# Patient Record
Sex: Male | Born: 1960 | Race: White | Hispanic: No | Marital: Married | Smoking: Current every day smoker
Health system: Southern US, Community
[De-identification: ages and names within clinical notes are randomized; demographics above are authoritative.]

## PROBLEM LIST (undated history)

## (undated) DIAGNOSIS — F1010 Alcohol abuse, uncomplicated: Secondary | ICD-10-CM

## (undated) DIAGNOSIS — M1990 Unspecified osteoarthritis, unspecified site: Secondary | ICD-10-CM

## (undated) DIAGNOSIS — I10 Essential (primary) hypertension: Secondary | ICD-10-CM

---

## 2003-06-23 ENCOUNTER — Other Ambulatory Visit: Payer: Self-pay

## 2004-04-17 ENCOUNTER — Emergency Department: Payer: Self-pay | Admitting: Emergency Medicine

## 2004-07-04 ENCOUNTER — Emergency Department: Payer: Self-pay | Admitting: Emergency Medicine

## 2004-09-18 ENCOUNTER — Emergency Department: Payer: Self-pay | Admitting: Emergency Medicine

## 2005-03-24 ENCOUNTER — Emergency Department: Payer: Self-pay | Admitting: Emergency Medicine

## 2005-04-04 ENCOUNTER — Ambulatory Visit: Payer: Self-pay | Admitting: Emergency Medicine

## 2005-06-22 ENCOUNTER — Emergency Department: Payer: Self-pay | Admitting: Emergency Medicine

## 2005-07-03 ENCOUNTER — Emergency Department: Payer: Self-pay | Admitting: Emergency Medicine

## 2005-09-25 ENCOUNTER — Emergency Department: Payer: Self-pay | Admitting: Emergency Medicine

## 2005-09-30 ENCOUNTER — Emergency Department: Payer: Self-pay | Admitting: Internal Medicine

## 2005-10-09 ENCOUNTER — Inpatient Hospital Stay: Payer: Self-pay | Admitting: Cardiology

## 2007-04-19 ENCOUNTER — Emergency Department: Payer: Self-pay | Admitting: Emergency Medicine

## 2007-05-02 ENCOUNTER — Emergency Department: Payer: Self-pay | Admitting: Unknown Physician Specialty

## 2007-12-20 ENCOUNTER — Ambulatory Visit: Payer: Self-pay

## 2009-05-23 ENCOUNTER — Emergency Department: Payer: Self-pay | Admitting: Emergency Medicine

## 2009-06-29 ENCOUNTER — Observation Stay: Payer: Self-pay | Admitting: Otolaryngology

## 2009-09-24 ENCOUNTER — Emergency Department: Payer: Self-pay | Admitting: Emergency Medicine

## 2010-07-05 ENCOUNTER — Emergency Department: Payer: Self-pay | Admitting: Emergency Medicine

## 2010-12-31 ENCOUNTER — Emergency Department: Payer: Self-pay | Admitting: Emergency Medicine

## 2011-03-17 ENCOUNTER — Inpatient Hospital Stay: Payer: Self-pay | Admitting: Psychiatry

## 2011-04-17 ENCOUNTER — Emergency Department: Payer: Self-pay | Admitting: Emergency Medicine

## 2012-10-22 ENCOUNTER — Emergency Department: Payer: Self-pay | Admitting: Emergency Medicine

## 2012-10-22 LAB — CBC
MCHC: 35 g/dL (ref 32.0–36.0)
MCV: 97 fL (ref 80–100)
RBC: 4.74 10*6/uL (ref 4.40–5.90)
WBC: 9.3 10*3/uL (ref 3.8–10.6)

## 2012-10-23 LAB — COMPREHENSIVE METABOLIC PANEL
Albumin: 4.3 g/dL (ref 3.4–5.0)
Anion Gap: 8 (ref 7–16)
BUN: 5 mg/dL — ABNORMAL LOW (ref 7–18)
Calcium, Total: 8.3 mg/dL — ABNORMAL LOW (ref 8.5–10.1)
Chloride: 109 mmol/L — ABNORMAL HIGH (ref 98–107)
Co2: 23 mmol/L (ref 21–32)
Creatinine: 0.76 mg/dL (ref 0.60–1.30)
EGFR (African American): >60
Potassium: 3.7 mmol/L (ref 3.5–5.1)
SGPT (ALT): 90 U/L — ABNORMAL HIGH (ref 12–78)
Total Protein: 8.4 g/dL — ABNORMAL HIGH (ref 6.4–8.2)

## 2012-10-23 LAB — DRUG SCREEN, URINE
Amphetamines, Ur Screen: NEGATIVE (ref ?–1000)
Cannabinoid 50 Ng, Ur ~~LOC~~: NEGATIVE (ref ?–50)
MDMA (Ecstasy)Ur Screen: NEGATIVE (ref ?–500)
Methadone, Ur Screen: NEGATIVE (ref ?–300)
Opiate, Ur Screen: NEGATIVE (ref ?–300)
Phencyclidine (PCP) Ur S: NEGATIVE (ref ?–25)
Tricyclic, Ur Screen: NEGATIVE (ref ?–1000)

## 2012-10-23 LAB — URINALYSIS, COMPLETE
Bacteria: NONE SEEN
Bilirubin,UR: NEGATIVE
Glucose,UR: NEGATIVE mg/dL (ref 0–75)
Leukocyte Esterase: NEGATIVE
Protein: NEGATIVE
Squamous Epithelial: <1
WBC UR: <1 /HPF (ref 0–5)

## 2012-10-23 LAB — ETHANOL: Ethanol %: 0.27 % — ABNORMAL HIGH (ref 0.000–0.080)

## 2012-10-23 LAB — SALICYLATE LEVEL: Salicylates, Serum: 2.6 mg/dL

## 2013-05-28 ENCOUNTER — Emergency Department: Payer: Self-pay | Admitting: Emergency Medicine

## 2013-05-28 LAB — CBC WITH DIFFERENTIAL/PLATELET
Basophil #: 0.1 10*3/uL (ref 0.0–0.1)
Basophil %: 0.5 %
HCT: 42.8 % (ref 40.0–52.0)
HGB: 14.7 g/dL (ref 13.0–18.0)
MCH: 33.2 pg (ref 26.0–34.0)
MCV: 96 fL (ref 80–100)
Neutrophil %: 56 %
Platelet: 192 10*3/uL (ref 150–440)
RBC: 4.44 10*6/uL (ref 4.40–5.90)
WBC: 11.4 10*3/uL — ABNORMAL HIGH (ref 3.8–10.6)

## 2013-05-28 LAB — COMPREHENSIVE METABOLIC PANEL
Alkaline Phosphatase: 82 U/L
Anion Gap: 9 (ref 7–16)
Bilirubin,Total: 0.4 mg/dL (ref 0.2–1.0)
Chloride: 103 mmol/L (ref 98–107)
Co2: 22 mmol/L (ref 21–32)
EGFR (African American): >60
EGFR (Non-African Amer.): >60
SGOT(AST): 63 U/L — ABNORMAL HIGH (ref 15–37)
SGPT (ALT): 69 U/L (ref 12–78)
Total Protein: 7.5 g/dL (ref 6.4–8.2)

## 2013-05-28 LAB — URINALYSIS, COMPLETE
Bilirubin,UR: NEGATIVE
Ph: 6 (ref 4.5–8.0)
Protein: NEGATIVE
RBC,UR: NONE SEEN /HPF (ref 0–5)
Specific Gravity: 1.003 (ref 1.003–1.030)
Squamous Epithelial: NONE SEEN

## 2013-05-28 LAB — LIPASE, BLOOD: Lipase: 97 U/L (ref 73–393)

## 2013-05-28 LAB — PROTIME-INR: Prothrombin Time: 12.7 secs (ref 11.5–14.7)

## 2013-05-28 LAB — ETHANOL: Ethanol %: 0.281 % — ABNORMAL HIGH (ref 0.000–0.080)

## 2013-06-04 ENCOUNTER — Emergency Department: Payer: Self-pay | Admitting: Emergency Medicine

## 2013-09-07 ENCOUNTER — Emergency Department: Payer: Self-pay | Admitting: Internal Medicine

## 2013-09-07 LAB — COMPREHENSIVE METABOLIC PANEL
ALT: 64 U/L (ref 12–78)
ANION GAP: 8 (ref 7–16)
Albumin: 4.2 g/dL (ref 3.4–5.0)
Alkaline Phosphatase: 99 U/L
BUN: 6 mg/dL — ABNORMAL LOW (ref 7–18)
Bilirubin,Total: 0.3 mg/dL (ref 0.2–1.0)
CO2: 24 mmol/L (ref 21–32)
CREATININE: 0.94 mg/dL (ref 0.60–1.30)
Calcium, Total: 8.5 mg/dL (ref 8.5–10.1)
Chloride: 108 mmol/L — ABNORMAL HIGH (ref 98–107)
GLUCOSE: 109 mg/dL — AB (ref 65–99)
OSMOLALITY: 278 (ref 275–301)
Potassium: 3.6 mmol/L (ref 3.5–5.1)
SGOT(AST): 46 U/L — ABNORMAL HIGH (ref 15–37)
Sodium: 140 mmol/L (ref 136–145)
Total Protein: 8.3 g/dL — ABNORMAL HIGH (ref 6.4–8.2)

## 2013-09-07 LAB — DRUG SCREEN, URINE

## 2013-09-07 LAB — CBC
HCT: 48.2 % (ref 40.0–52.0)
HGB: 15.7 g/dL (ref 13.0–18.0)
MCH: 32 pg (ref 26.0–34.0)
MCHC: 32.6 g/dL (ref 32.0–36.0)
MCV: 98 fL (ref 80–100)
Platelet: 212 10*3/uL (ref 150–440)
RBC: 4.92 10*6/uL (ref 4.40–5.90)
RDW: 12.7 % (ref 11.5–14.5)
WBC: 11.2 10*3/uL — AB (ref 3.8–10.6)

## 2013-09-07 LAB — ETHANOL
Ethanol %: 0.21 % — ABNORMAL HIGH (ref 0.000–0.080)
Ethanol: 210 mg/dL

## 2013-09-07 LAB — URINALYSIS, COMPLETE
BACTERIA: NONE SEEN
Bilirubin,UR: NEGATIVE
GLUCOSE, UR: NEGATIVE mg/dL (ref 0–75)
KETONE: NEGATIVE
Leukocyte Esterase: NEGATIVE
NITRITE: NEGATIVE
PH: 6 (ref 4.5–8.0)
PROTEIN: NEGATIVE
RBC,UR: 1 /HPF (ref 0–5)
SQUAMOUS EPITHELIAL: NONE SEEN
Specific Gravity: 1.002 (ref 1.003–1.030)
WBC UR: NONE SEEN /HPF (ref 0–5)

## 2013-09-07 LAB — SALICYLATE LEVEL: Salicylates, Serum: 3.4 mg/dL — ABNORMAL HIGH

## 2013-09-07 LAB — ACETAMINOPHEN LEVEL: Acetaminophen: <2 ug/mL

## 2013-11-03 ENCOUNTER — Emergency Department: Payer: Self-pay | Admitting: Internal Medicine

## 2013-11-06 ENCOUNTER — Emergency Department: Payer: Self-pay | Admitting: Emergency Medicine

## 2013-11-06 LAB — CBC
HCT: 43.9 % (ref 40.0–52.0)
HGB: 14.9 g/dL (ref 13.0–18.0)
MCH: 33.4 pg (ref 26.0–34.0)
MCHC: 33.9 g/dL (ref 32.0–36.0)
MCV: 98 fL (ref 80–100)
Platelet: 197 10*3/uL (ref 150–440)
RBC: 4.45 10*6/uL (ref 4.40–5.90)
RDW: 13.3 % (ref 11.5–14.5)
WBC: 9.5 10*3/uL (ref 3.8–10.6)

## 2013-11-06 LAB — DRUG SCREEN, URINE
Amphetamines, Ur Screen: NEGATIVE (ref ?–1000)
BARBITURATES, UR SCREEN: NEGATIVE (ref ?–200)
BENZODIAZEPINE, UR SCRN: NEGATIVE (ref ?–200)
CANNABINOID 50 NG, UR ~~LOC~~: NEGATIVE (ref ?–50)
Cocaine Metabolite,Ur ~~LOC~~: NEGATIVE (ref ?–300)
MDMA (ECSTASY) UR SCREEN: NEGATIVE (ref ?–500)
METHADONE, UR SCREEN: NEGATIVE (ref ?–300)
Opiate, Ur Screen: NEGATIVE (ref ?–300)
PHENCYCLIDINE (PCP) UR S: NEGATIVE (ref ?–25)
Tricyclic, Ur Screen: NEGATIVE (ref ?–1000)

## 2013-11-06 LAB — COMPREHENSIVE METABOLIC PANEL
ANION GAP: 7 (ref 7–16)
AST: 50 U/L — AB (ref 15–37)
Albumin: 4 g/dL (ref 3.4–5.0)
Alkaline Phosphatase: 96 U/L
BILIRUBIN TOTAL: 0.3 mg/dL (ref 0.2–1.0)
BUN: 7 mg/dL (ref 7–18)
CO2: 22 mmol/L (ref 21–32)
CREATININE: 0.92 mg/dL (ref 0.60–1.30)
Calcium, Total: 8.2 mg/dL — ABNORMAL LOW (ref 8.5–10.1)
Chloride: 112 mmol/L — ABNORMAL HIGH (ref 98–107)
EGFR (African American): >60
EGFR (Non-African Amer.): >60
Glucose: 113 mg/dL — ABNORMAL HIGH (ref 65–99)
OSMOLALITY: 280 (ref 275–301)
Potassium: 3.8 mmol/L (ref 3.5–5.1)
SGPT (ALT): 58 U/L (ref 12–78)
Sodium: 141 mmol/L (ref 136–145)
Total Protein: 7.7 g/dL (ref 6.4–8.2)

## 2013-11-06 LAB — URINALYSIS, COMPLETE
BACTERIA: NONE SEEN
Bilirubin,UR: NEGATIVE
GLUCOSE, UR: NEGATIVE mg/dL (ref 0–75)
Ketone: NEGATIVE
LEUKOCYTE ESTERASE: NEGATIVE
Nitrite: NEGATIVE
Ph: 6 (ref 4.5–8.0)
Protein: NEGATIVE
RBC,UR: NONE SEEN /HPF (ref 0–5)
Specific Gravity: 1.002 (ref 1.003–1.030)
Squamous Epithelial: NONE SEEN
WBC UR: <1 /HPF (ref 0–5)

## 2013-11-06 LAB — ETHANOL
Ethanol %: 0.292 % — ABNORMAL HIGH (ref 0.000–0.080)
Ethanol: 292 mg/dL

## 2013-11-15 ENCOUNTER — Emergency Department: Payer: Self-pay | Admitting: Emergency Medicine

## 2013-11-15 LAB — CBC
HCT: 43.8 % (ref 40.0–52.0)
HGB: 15.3 g/dL (ref 13.0–18.0)
MCH: 33.9 pg (ref 26.0–34.0)
MCHC: 34.9 g/dL (ref 32.0–36.0)
MCV: 97 fL (ref 80–100)
Platelet: 172 10*3/uL (ref 150–440)
RBC: 4.5 10*6/uL (ref 4.40–5.90)
RDW: 13.1 % (ref 11.5–14.5)
WBC: 9.9 10*3/uL (ref 3.8–10.6)

## 2013-11-15 LAB — URINALYSIS, COMPLETE
Bacteria: NONE SEEN
Bilirubin,UR: NEGATIVE
GLUCOSE, UR: NEGATIVE mg/dL (ref 0–75)
Ketone: NEGATIVE
Leukocyte Esterase: NEGATIVE
NITRITE: NEGATIVE
PH: 6 (ref 4.5–8.0)
Protein: NEGATIVE
RBC,UR: NONE SEEN /HPF (ref 0–5)
Specific Gravity: 1.003 (ref 1.003–1.030)
Squamous Epithelial: NONE SEEN
WBC UR: NONE SEEN /HPF (ref 0–5)

## 2013-11-15 LAB — COMPREHENSIVE METABOLIC PANEL
ALBUMIN: 3.8 g/dL (ref 3.4–5.0)
ALT: 58 U/L (ref 12–78)
Alkaline Phosphatase: 78 U/L
Anion Gap: 9 (ref 7–16)
BUN: 8 mg/dL (ref 7–18)
Bilirubin,Total: 0.3 mg/dL (ref 0.2–1.0)
CHLORIDE: 107 mmol/L (ref 98–107)
Calcium, Total: 8.3 mg/dL — ABNORMAL LOW (ref 8.5–10.1)
Co2: 23 mmol/L (ref 21–32)
Creatinine: 0.86 mg/dL (ref 0.60–1.30)
EGFR (Non-African Amer.): >60
GLUCOSE: 111 mg/dL — AB (ref 65–99)
OSMOLALITY: 277 (ref 275–301)
Potassium: 3.5 mmol/L (ref 3.5–5.1)
SGOT(AST): 53 U/L — ABNORMAL HIGH (ref 15–37)
SODIUM: 139 mmol/L (ref 136–145)
Total Protein: 7.6 g/dL (ref 6.4–8.2)

## 2013-11-15 LAB — DRUG SCREEN, URINE

## 2013-11-15 LAB — ETHANOL
Ethanol %: 0.292 % — ABNORMAL HIGH (ref 0.000–0.080)
Ethanol: 292 mg/dL

## 2013-11-15 LAB — SALICYLATE LEVEL: Salicylates, Serum: 3.4 mg/dL — ABNORMAL HIGH

## 2013-11-15 LAB — ACETAMINOPHEN LEVEL: Acetaminophen: <2 ug/mL

## 2013-11-19 ENCOUNTER — Inpatient Hospital Stay: Payer: Self-pay | Admitting: Psychiatry

## 2013-11-19 LAB — COMPREHENSIVE METABOLIC PANEL
Albumin: 3.9 g/dL (ref 3.4–5.0)
Alkaline Phosphatase: 73 U/L
Anion Gap: 9 (ref 7–16)
BUN: 6 mg/dL — ABNORMAL LOW (ref 7–18)
Bilirubin,Total: 0.3 mg/dL (ref 0.2–1.0)
CALCIUM: 8.2 mg/dL — AB (ref 8.5–10.1)
CREATININE: 0.73 mg/dL (ref 0.60–1.30)
Chloride: 104 mmol/L (ref 98–107)
Co2: 24 mmol/L (ref 21–32)
EGFR (African American): >60
EGFR (Non-African Amer.): >60
Glucose: 100 mg/dL — ABNORMAL HIGH (ref 65–99)
Osmolality: 272 (ref 275–301)
Potassium: 3.4 mmol/L — ABNORMAL LOW (ref 3.5–5.1)
SGOT(AST): 57 U/L — ABNORMAL HIGH (ref 15–37)
SGPT (ALT): 72 U/L (ref 12–78)
SODIUM: 137 mmol/L (ref 136–145)
Total Protein: 7.7 g/dL (ref 6.4–8.2)

## 2013-11-19 LAB — URINALYSIS, COMPLETE
Bacteria: NONE SEEN
Bilirubin,UR: NEGATIVE
GLUCOSE, UR: NEGATIVE mg/dL (ref 0–75)
KETONE: NEGATIVE
Nitrite: NEGATIVE
Ph: 6 (ref 4.5–8.0)
Protein: NEGATIVE
Specific Gravity: 1.003 (ref 1.003–1.030)
Squamous Epithelial: <1
WBC UR: 3 /HPF (ref 0–5)

## 2013-11-19 LAB — CBC
HCT: 44.1 % (ref 40.0–52.0)
HGB: 15 g/dL (ref 13.0–18.0)
MCH: 33.3 pg (ref 26.0–34.0)
MCHC: 34 g/dL (ref 32.0–36.0)
MCV: 98 fL (ref 80–100)
PLATELETS: 179 10*3/uL (ref 150–440)
RBC: 4.52 10*6/uL (ref 4.40–5.90)
RDW: 12.8 % (ref 11.5–14.5)
WBC: 8.8 10*3/uL (ref 3.8–10.6)

## 2013-11-19 LAB — DRUG SCREEN, URINE

## 2013-11-19 LAB — SALICYLATE LEVEL: SALICYLATES, SERUM: 3.3 mg/dL — AB

## 2013-11-19 LAB — ETHANOL
Ethanol %: 0.281 % — ABNORMAL HIGH (ref 0.000–0.080)
Ethanol: 281 mg/dL

## 2013-11-19 LAB — ACETAMINOPHEN LEVEL: Acetaminophen: <2 ug/mL

## 2013-11-26 ENCOUNTER — Emergency Department: Payer: Self-pay | Admitting: Emergency Medicine

## 2013-11-26 LAB — URINALYSIS, COMPLETE
BACTERIA: NONE SEEN
Bilirubin,UR: NEGATIVE
GLUCOSE, UR: NEGATIVE mg/dL (ref 0–75)
Ketone: NEGATIVE
Leukocyte Esterase: NEGATIVE
NITRITE: NEGATIVE
PH: 6 (ref 4.5–8.0)
Protein: NEGATIVE
RBC,UR: NONE SEEN /HPF (ref 0–5)
SPECIFIC GRAVITY: 1.003 (ref 1.003–1.030)
SQUAMOUS EPITHELIAL: NONE SEEN
WBC UR: 1 /HPF (ref 0–5)

## 2013-11-26 LAB — COMPREHENSIVE METABOLIC PANEL
ALK PHOS: 71 U/L
AST: 42 U/L — AB (ref 15–37)
Albumin: 3.7 g/dL (ref 3.4–5.0)
Anion Gap: 8 (ref 7–16)
BUN: 7 mg/dL (ref 7–18)
Bilirubin,Total: 0.2 mg/dL (ref 0.2–1.0)
CALCIUM: 8 mg/dL — AB (ref 8.5–10.1)
CO2: 24 mmol/L (ref 21–32)
Chloride: 102 mmol/L (ref 98–107)
Creatinine: 0.84 mg/dL (ref 0.60–1.30)
EGFR (African American): >60
Glucose: 124 mg/dL — ABNORMAL HIGH (ref 65–99)
Osmolality: 268 (ref 275–301)
Potassium: 3.5 mmol/L (ref 3.5–5.1)
SGPT (ALT): 53 U/L (ref 12–78)
Sodium: 134 mmol/L — ABNORMAL LOW (ref 136–145)
Total Protein: 7.3 g/dL (ref 6.4–8.2)

## 2013-11-26 LAB — CBC
HCT: 42.4 % (ref 40.0–52.0)
HGB: 14.4 g/dL (ref 13.0–18.0)
MCH: 33.4 pg (ref 26.0–34.0)
MCHC: 33.9 g/dL (ref 32.0–36.0)
MCV: 99 fL (ref 80–100)
Platelet: 196 10*3/uL (ref 150–440)
RBC: 4.3 10*6/uL — ABNORMAL LOW (ref 4.40–5.90)
RDW: 13 % (ref 11.5–14.5)
WBC: 12 10*3/uL — AB (ref 3.8–10.6)

## 2013-11-26 LAB — DRUG SCREEN, URINE

## 2013-11-26 LAB — ACETAMINOPHEN LEVEL

## 2013-11-26 LAB — SALICYLATE LEVEL: Salicylates, Serum: 2.6 mg/dL

## 2013-11-26 LAB — ETHANOL
Ethanol %: 0.261 % — ABNORMAL HIGH (ref 0.000–0.080)
Ethanol: 261 mg/dL

## 2013-12-06 ENCOUNTER — Emergency Department: Payer: Self-pay | Admitting: Emergency Medicine

## 2013-12-06 LAB — DRUG SCREEN, URINE

## 2013-12-06 LAB — URINALYSIS, COMPLETE
BILIRUBIN, UR: NEGATIVE
Bacteria: NONE SEEN
GLUCOSE, UR: NEGATIVE mg/dL (ref 0–75)
Ketone: NEGATIVE
Leukocyte Esterase: NEGATIVE
Nitrite: NEGATIVE
Ph: 6 (ref 4.5–8.0)
Protein: NEGATIVE
RBC, UR: NONE SEEN /HPF (ref 0–5)
Specific Gravity: 1.002 (ref 1.003–1.030)
Squamous Epithelial: NONE SEEN
WBC UR: NONE SEEN /HPF (ref 0–5)

## 2013-12-06 LAB — CBC
HCT: 44.8 % (ref 40.0–52.0)
HGB: 15.2 g/dL (ref 13.0–18.0)
MCH: 33 pg (ref 26.0–34.0)
MCHC: 33.9 g/dL (ref 32.0–36.0)
MCV: 97 fL (ref 80–100)
PLATELETS: 203 10*3/uL (ref 150–440)
RBC: 4.61 10*6/uL (ref 4.40–5.90)
RDW: 12.7 % (ref 11.5–14.5)
WBC: 10.9 10*3/uL — ABNORMAL HIGH (ref 3.8–10.6)

## 2013-12-06 LAB — COMPREHENSIVE METABOLIC PANEL
ALBUMIN: 4.1 g/dL (ref 3.4–5.0)
ANION GAP: 8 (ref 7–16)
Alkaline Phosphatase: 82 U/L
BUN: 6 mg/dL — ABNORMAL LOW (ref 7–18)
Bilirubin,Total: 0.3 mg/dL (ref 0.2–1.0)
CREATININE: 0.78 mg/dL (ref 0.60–1.30)
Calcium, Total: 8.4 mg/dL — ABNORMAL LOW (ref 8.5–10.1)
Chloride: 106 mmol/L (ref 98–107)
Co2: 23 mmol/L (ref 21–32)
EGFR (African American): >60
Glucose: 103 mg/dL — ABNORMAL HIGH (ref 65–99)
Osmolality: 272 (ref 275–301)
POTASSIUM: 3.6 mmol/L (ref 3.5–5.1)
SGOT(AST): 61 U/L — ABNORMAL HIGH (ref 15–37)
SGPT (ALT): 50 U/L (ref 12–78)
Sodium: 137 mmol/L (ref 136–145)
TOTAL PROTEIN: 8.1 g/dL (ref 6.4–8.2)

## 2013-12-06 LAB — ETHANOL
ETHANOL %: 0.322 % — AB (ref 0.000–0.080)
Ethanol: 322 mg/dL

## 2013-12-06 LAB — ACETAMINOPHEN LEVEL

## 2013-12-06 LAB — SALICYLATE LEVEL: Salicylates, Serum: 4.4 mg/dL — ABNORMAL HIGH

## 2013-12-20 ENCOUNTER — Inpatient Hospital Stay: Payer: Self-pay | Admitting: Internal Medicine

## 2013-12-20 LAB — COMPREHENSIVE METABOLIC PANEL
ALBUMIN: 3.9 g/dL (ref 3.4–5.0)
ALT: 43 U/L (ref 12–78)
AST: 47 U/L — AB (ref 15–37)
Alkaline Phosphatase: 81 U/L
Anion Gap: 12 (ref 7–16)
BUN: 5 mg/dL — AB (ref 7–18)
Bilirubin,Total: 0.3 mg/dL (ref 0.2–1.0)
CALCIUM: 8 mg/dL — AB (ref 8.5–10.1)
CREATININE: 0.81 mg/dL (ref 0.60–1.30)
Chloride: 104 mmol/L (ref 98–107)
Co2: 24 mmol/L (ref 21–32)
EGFR (Non-African Amer.): >60
GLUCOSE: 93 mg/dL (ref 65–99)
Osmolality: 276 (ref 275–301)
Potassium: 3.4 mmol/L — ABNORMAL LOW (ref 3.5–5.1)
SODIUM: 140 mmol/L (ref 136–145)
TOTAL PROTEIN: 7.6 g/dL (ref 6.4–8.2)

## 2013-12-20 LAB — DRUG SCREEN, URINE
Amphetamines, Ur Screen: NEGATIVE (ref ?–1000)
BARBITURATES, UR SCREEN: NEGATIVE (ref ?–200)
BENZODIAZEPINE, UR SCRN: NEGATIVE (ref ?–200)
Cannabinoid 50 Ng, Ur ~~LOC~~: NEGATIVE (ref ?–50)
Cocaine Metabolite,Ur ~~LOC~~: NEGATIVE (ref ?–300)
MDMA (ECSTASY) UR SCREEN: NEGATIVE (ref ?–500)
METHADONE, UR SCREEN: NEGATIVE (ref ?–300)
Opiate, Ur Screen: NEGATIVE (ref ?–300)
PHENCYCLIDINE (PCP) UR S: NEGATIVE (ref ?–25)
TRICYCLIC, UR SCREEN: NEGATIVE (ref ?–1000)

## 2013-12-20 LAB — URINALYSIS, COMPLETE
Bacteria: NONE SEEN
Bilirubin,UR: NEGATIVE
Blood: NEGATIVE
Glucose,UR: NEGATIVE mg/dL (ref 0–75)
KETONE: NEGATIVE
LEUKOCYTE ESTERASE: NEGATIVE
Nitrite: NEGATIVE
PROTEIN: NEGATIVE
Ph: 6 (ref 4.5–8.0)
RBC,UR: <1 /HPF (ref 0–5)
SPECIFIC GRAVITY: 1.003 (ref 1.003–1.030)
SQUAMOUS EPITHELIAL: NONE SEEN
WBC UR: NONE SEEN /HPF (ref 0–5)

## 2013-12-20 LAB — ETHANOL
Ethanol %: 0.277 % — ABNORMAL HIGH (ref 0.000–0.080)
Ethanol: 277 mg/dL

## 2013-12-20 LAB — SALICYLATE LEVEL: SALICYLATES, SERUM: 3.8 mg/dL — AB

## 2013-12-20 LAB — CBC
HCT: 44.2 % (ref 40.0–52.0)
HGB: 14.6 g/dL (ref 13.0–18.0)
MCH: 33 pg (ref 26.0–34.0)
MCHC: 33.1 g/dL (ref 32.0–36.0)
MCV: 100 fL (ref 80–100)
PLATELETS: 178 10*3/uL (ref 150–440)
RBC: 4.44 10*6/uL (ref 4.40–5.90)
RDW: 12.8 % (ref 11.5–14.5)
WBC: 10.2 10*3/uL (ref 3.8–10.6)

## 2013-12-20 LAB — TSH: Thyroid Stimulating Horm: 1.46 u[IU]/mL

## 2013-12-20 LAB — PHOSPHORUS: Phosphorus: 4 mg/dL (ref 2.5–4.9)

## 2013-12-20 LAB — ACETAMINOPHEN LEVEL: Acetaminophen: <2 ug/mL

## 2013-12-20 LAB — LIPASE, BLOOD: Lipase: 129 U/L (ref 73–393)

## 2013-12-20 LAB — TROPONIN I

## 2013-12-21 LAB — BASIC METABOLIC PANEL
Anion Gap: 4 — ABNORMAL LOW (ref 7–16)
BUN: 5 mg/dL — ABNORMAL LOW (ref 7–18)
CHLORIDE: 118 mmol/L — AB (ref 98–107)
CO2: 23 mmol/L (ref 21–32)
Calcium, Total: 7.6 mg/dL — ABNORMAL LOW (ref 8.5–10.1)
Creatinine: 0.94 mg/dL (ref 0.60–1.30)
EGFR (African American): >60
EGFR (Non-African Amer.): >60
Glucose: 114 mg/dL — ABNORMAL HIGH (ref 65–99)
Osmolality: 287 (ref 275–301)
Potassium: 3.9 mmol/L (ref 3.5–5.1)
SODIUM: 145 mmol/L (ref 136–145)

## 2013-12-21 LAB — PHOSPHORUS
PHOSPHORUS: 2.1 mg/dL — AB (ref 2.5–4.9)
PHOSPHORUS: 1.8 mg/dL — AB (ref 2.5–4.9)

## 2013-12-21 LAB — CBC WITH DIFFERENTIAL/PLATELET
Basophil #: 0.1 10*3/uL (ref 0.0–0.1)
Basophil %: 1.1 %
Eosinophil #: 0.2 10*3/uL (ref 0.0–0.7)
Eosinophil %: 2.6 %
HCT: 36.5 % — ABNORMAL LOW (ref 40.0–52.0)
HGB: 12.3 g/dL — ABNORMAL LOW (ref 13.0–18.0)
Lymphocyte #: 2.1 10*3/uL (ref 1.0–3.6)
Lymphocyte %: 24.3 %
MCH: 33.5 pg (ref 26.0–34.0)
MCHC: 33.6 g/dL (ref 32.0–36.0)
MCV: 100 fL (ref 80–100)
Monocyte #: 0.8 x10 3/mm (ref 0.2–1.0)
Monocyte %: 8.8 %
Neutrophil #: 5.5 10*3/uL (ref 1.4–6.5)
Neutrophil %: 63.2 %
PLATELETS: 153 10*3/uL (ref 150–440)
RBC: 3.66 10*6/uL — ABNORMAL LOW (ref 4.40–5.90)
RDW: 13.3 % (ref 11.5–14.5)
WBC: 8.8 10*3/uL (ref 3.8–10.6)

## 2013-12-21 LAB — COMPREHENSIVE METABOLIC PANEL
ALBUMIN: 2.7 g/dL — AB (ref 3.4–5.0)
ALK PHOS: 55 U/L
AST: 21 U/L (ref 15–37)
Anion Gap: 5 — ABNORMAL LOW (ref 7–16)
BUN: 3 mg/dL — ABNORMAL LOW (ref 7–18)
Bilirubin,Total: 0.2 mg/dL (ref 0.2–1.0)
CREATININE: 0.91 mg/dL (ref 0.60–1.30)
Calcium, Total: 7.4 mg/dL — ABNORMAL LOW (ref 8.5–10.1)
Chloride: 117 mmol/L — ABNORMAL HIGH (ref 98–107)
Co2: 24 mmol/L (ref 21–32)
GLUCOSE: 126 mg/dL — AB (ref 65–99)
Osmolality: 289 (ref 275–301)
Potassium: 3.9 mmol/L (ref 3.5–5.1)
SGPT (ALT): 27 U/L (ref 12–78)
SODIUM: 146 mmol/L — AB (ref 136–145)
TOTAL PROTEIN: 5.4 g/dL — AB (ref 6.4–8.2)

## 2013-12-21 LAB — TSH: THYROID STIMULATING HORM: 1.33 u[IU]/mL

## 2013-12-21 LAB — MAGNESIUM
MAGNESIUM: 2.6 mg/dL — AB
Magnesium: 2.7 mg/dL — ABNORMAL HIGH

## 2013-12-22 LAB — BASIC METABOLIC PANEL
ANION GAP: 10 (ref 7–16)
BUN: 6 mg/dL — ABNORMAL LOW (ref 7–18)
Calcium, Total: 7.5 mg/dL — ABNORMAL LOW (ref 8.5–10.1)
Chloride: 114 mmol/L — ABNORMAL HIGH (ref 98–107)
Co2: 21 mmol/L (ref 21–32)
Creatinine: 1.12 mg/dL (ref 0.60–1.30)
EGFR (African American): >60
EGFR (Non-African Amer.): >60
Glucose: 102 mg/dL — ABNORMAL HIGH (ref 65–99)
Osmolality: 287 (ref 275–301)
Potassium: 3.7 mmol/L (ref 3.5–5.1)
Sodium: 145 mmol/L (ref 136–145)

## 2013-12-22 LAB — CBC WITH DIFFERENTIAL/PLATELET
Basophil #: 0.1 10*3/uL (ref 0.0–0.1)
Basophil %: 1.3 %
Eosinophil #: 0.2 10*3/uL (ref 0.0–0.7)
Eosinophil %: 2.4 %
HCT: 36 % — AB (ref 40.0–52.0)
HGB: 11.5 g/dL — ABNORMAL LOW (ref 13.0–18.0)
LYMPHS PCT: 25.7 %
Lymphocyte #: 2.3 10*3/uL (ref 1.0–3.6)
MCH: 32.5 pg (ref 26.0–34.0)
MCHC: 32 g/dL (ref 32.0–36.0)
MCV: 102 fL — ABNORMAL HIGH (ref 80–100)
Monocyte #: 0.9 x10 3/mm (ref 0.2–1.0)
Monocyte %: 10 %
NEUTROS ABS: 5.4 10*3/uL (ref 1.4–6.5)
Neutrophil %: 60.6 %
Platelet: 134 10*3/uL — ABNORMAL LOW (ref 150–440)
RBC: 3.54 10*6/uL — ABNORMAL LOW (ref 4.40–5.90)
RDW: 14.1 % (ref 11.5–14.5)
WBC: 8.9 10*3/uL (ref 3.8–10.6)

## 2013-12-22 LAB — PHOSPHORUS: PHOSPHORUS: 2.4 mg/dL — AB (ref 2.5–4.9)

## 2013-12-22 LAB — MAGNESIUM: MAGNESIUM: 2.1 mg/dL

## 2013-12-23 LAB — BASIC METABOLIC PANEL
ANION GAP: 9 (ref 7–16)
BUN: 6 mg/dL — AB (ref 7–18)
CALCIUM: 7.4 mg/dL — AB (ref 8.5–10.1)
CHLORIDE: 114 mmol/L — AB (ref 98–107)
Co2: 22 mmol/L (ref 21–32)
Creatinine: 0.85 mg/dL (ref 0.60–1.30)
EGFR (African American): >60
Glucose: 117 mg/dL — ABNORMAL HIGH (ref 65–99)
Osmolality: 287 (ref 275–301)
Potassium: 4.2 mmol/L (ref 3.5–5.1)
SODIUM: 145 mmol/L (ref 136–145)

## 2013-12-23 LAB — MAGNESIUM: MAGNESIUM: 2.1 mg/dL

## 2013-12-23 LAB — PHOSPHORUS: Phosphorus: 2.4 mg/dL — ABNORMAL LOW (ref 2.5–4.9)

## 2013-12-24 LAB — CBC WITH DIFFERENTIAL/PLATELET
BASOS ABS: 0 10*3/uL (ref 0.0–0.1)
Basophil %: 0.1 %
EOS ABS: 0 10*3/uL (ref 0.0–0.7)
Eosinophil %: 0 %
HCT: 38.1 % — ABNORMAL LOW (ref 40.0–52.0)
HGB: 12.3 g/dL — AB (ref 13.0–18.0)
LYMPHS ABS: 0.7 10*3/uL — AB (ref 1.0–3.6)
Lymphocyte %: 7.1 %
MCH: 32.6 pg (ref 26.0–34.0)
MCHC: 32.4 g/dL (ref 32.0–36.0)
MCV: 101 fL — ABNORMAL HIGH (ref 80–100)
MONO ABS: 0.5 x10 3/mm (ref 0.2–1.0)
Monocyte %: 5.4 %
Neutrophil #: 8.3 10*3/uL — ABNORMAL HIGH (ref 1.4–6.5)
Neutrophil %: 87.4 %
Platelet: 173 10*3/uL (ref 150–440)
RBC: 3.79 10*6/uL — AB (ref 4.40–5.90)
RDW: 13.6 % (ref 11.5–14.5)
WBC: 9.5 10*3/uL (ref 3.8–10.6)

## 2013-12-24 LAB — TRIGLYCERIDES: Triglycerides: 266 mg/dL — ABNORMAL HIGH (ref 0–200)

## 2013-12-24 LAB — BASIC METABOLIC PANEL
Anion Gap: 7 (ref 7–16)
BUN: 7 mg/dL (ref 7–18)
CHLORIDE: 118 mmol/L — AB (ref 98–107)
CREATININE: 1.05 mg/dL (ref 0.60–1.30)
Calcium, Total: 8.1 mg/dL — ABNORMAL LOW (ref 8.5–10.1)
Co2: 24 mmol/L (ref 21–32)
EGFR (African American): >60
EGFR (Non-African Amer.): >60
GLUCOSE: 181 mg/dL — AB (ref 65–99)
OSMOLALITY: 299 (ref 275–301)
POTASSIUM: 3.7 mmol/L (ref 3.5–5.1)
Sodium: 149 mmol/L — ABNORMAL HIGH (ref 136–145)

## 2013-12-24 LAB — MAGNESIUM: Magnesium: 2.1 mg/dL

## 2013-12-24 LAB — PHOSPHORUS
PHOSPHORUS: 1.6 mg/dL — AB (ref 2.5–4.9)
Phosphorus: 2.6 mg/dL (ref 2.5–4.9)

## 2013-12-24 LAB — EXPECTORATED SPUTUM ASSESSMENT W REFEX TO RESP CULTURE

## 2013-12-25 LAB — BASIC METABOLIC PANEL
ANION GAP: 9 (ref 7–16)
BUN: 12 mg/dL (ref 7–18)
Calcium, Total: 8.1 mg/dL — ABNORMAL LOW (ref 8.5–10.1)
Chloride: 117 mmol/L — ABNORMAL HIGH (ref 98–107)
Co2: 25 mmol/L (ref 21–32)
Creatinine: 0.88 mg/dL (ref 0.60–1.30)
EGFR (African American): >60
EGFR (Non-African Amer.): >60
Glucose: 106 mg/dL — ABNORMAL HIGH (ref 65–99)
Osmolality: 300 (ref 275–301)
Potassium: 3.7 mmol/L (ref 3.5–5.1)
Sodium: 151 mmol/L — ABNORMAL HIGH (ref 136–145)

## 2013-12-25 LAB — MAGNESIUM: MAGNESIUM: 2.2 mg/dL

## 2013-12-25 LAB — PHOSPHORUS: PHOSPHORUS: 2.6 mg/dL (ref 2.5–4.9)

## 2013-12-26 LAB — CBC WITH DIFFERENTIAL/PLATELET
BASOS ABS: 0.1 10*3/uL (ref 0.0–0.1)
BASOS PCT: 0.8 %
Eosinophil #: 0.3 10*3/uL (ref 0.0–0.7)
Eosinophil %: 3.9 %
HCT: 33.8 % — ABNORMAL LOW (ref 40.0–52.0)
HGB: 11.2 g/dL — ABNORMAL LOW (ref 13.0–18.0)
LYMPHS ABS: 1.9 10*3/uL (ref 1.0–3.6)
Lymphocyte %: 28.2 %
MCH: 33.1 pg (ref 26.0–34.0)
MCHC: 33.1 g/dL (ref 32.0–36.0)
MCV: 100 fL (ref 80–100)
MONO ABS: 0.5 x10 3/mm (ref 0.2–1.0)
MONOS PCT: 7.7 %
NEUTROS ABS: 4 10*3/uL (ref 1.4–6.5)
Neutrophil %: 59.4 %
Platelet: 183 10*3/uL (ref 150–440)
RBC: 3.38 10*6/uL — ABNORMAL LOW (ref 4.40–5.90)
RDW: 13.9 % (ref 11.5–14.5)
WBC: 6.7 10*3/uL (ref 3.8–10.6)

## 2013-12-26 LAB — BASIC METABOLIC PANEL
Anion Gap: 5 — ABNORMAL LOW (ref 7–16)
BUN: 14 mg/dL (ref 7–18)
CHLORIDE: 114 mmol/L — AB (ref 98–107)
CO2: 28 mmol/L (ref 21–32)
Calcium, Total: 8.4 mg/dL — ABNORMAL LOW (ref 8.5–10.1)
Creatinine: 0.75 mg/dL (ref 0.60–1.30)
EGFR (African American): >60
EGFR (Non-African Amer.): >60
Glucose: 110 mg/dL — ABNORMAL HIGH (ref 65–99)
OSMOLALITY: 294 (ref 275–301)
Potassium: 3.3 mmol/L — ABNORMAL LOW (ref 3.5–5.1)
SODIUM: 147 mmol/L — AB (ref 136–145)

## 2013-12-26 LAB — CULTURE, BLOOD (SINGLE)

## 2013-12-27 LAB — BASIC METABOLIC PANEL
Anion Gap: 8 (ref 7–16)
BUN: 16 mg/dL (ref 7–18)
CALCIUM: 8.6 mg/dL (ref 8.5–10.1)
CREATININE: 1 mg/dL (ref 0.60–1.30)
Chloride: 110 mmol/L — ABNORMAL HIGH (ref 98–107)
Co2: 25 mmol/L (ref 21–32)
Glucose: 103 mg/dL — ABNORMAL HIGH (ref 65–99)
Osmolality: 286 (ref 275–301)
Potassium: 3.4 mmol/L — ABNORMAL LOW (ref 3.5–5.1)
Sodium: 143 mmol/L (ref 136–145)

## 2013-12-27 LAB — PHOSPHORUS: PHOSPHORUS: 3.8 mg/dL (ref 2.5–4.9)

## 2013-12-27 LAB — TRIGLYCERIDES: TRIGLYCERIDES: 353 mg/dL — AB (ref 0–200)

## 2013-12-27 LAB — MAGNESIUM: Magnesium: 2.4 mg/dL

## 2013-12-28 LAB — BASIC METABOLIC PANEL
Anion Gap: 5 — ABNORMAL LOW (ref 7–16)
BUN: 19 mg/dL — AB (ref 7–18)
CALCIUM: 8.3 mg/dL — AB (ref 8.5–10.1)
CHLORIDE: 112 mmol/L — AB (ref 98–107)
Co2: 25 mmol/L (ref 21–32)
Creatinine: 0.89 mg/dL (ref 0.60–1.30)
EGFR (African American): >60
EGFR (Non-African Amer.): >60
Glucose: 109 mg/dL — ABNORMAL HIGH (ref 65–99)
OSMOLALITY: 286 (ref 275–301)
Potassium: 3.6 mmol/L (ref 3.5–5.1)
Sodium: 142 mmol/L (ref 136–145)

## 2013-12-28 LAB — PHOSPHORUS: Phosphorus: 2.9 mg/dL (ref 2.5–4.9)

## 2013-12-28 LAB — MAGNESIUM: Magnesium: 2.6 mg/dL — ABNORMAL HIGH

## 2013-12-28 LAB — TRIGLYCERIDES: Triglycerides: 288 mg/dL — ABNORMAL HIGH (ref 0–200)

## 2013-12-29 LAB — PHOSPHORUS: Phosphorus: 2.6 mg/dL (ref 2.5–4.9)

## 2013-12-29 LAB — BASIC METABOLIC PANEL
Anion Gap: 7 (ref 7–16)
BUN: 22 mg/dL — AB (ref 7–18)
Calcium, Total: 8.3 mg/dL — ABNORMAL LOW (ref 8.5–10.1)
Chloride: 114 mmol/L — ABNORMAL HIGH (ref 98–107)
Co2: 23 mmol/L (ref 21–32)
Creatinine: 0.87 mg/dL (ref 0.60–1.30)
GLUCOSE: 99 mg/dL (ref 65–99)
Osmolality: 290 (ref 275–301)
POTASSIUM: 3.5 mmol/L (ref 3.5–5.1)
SODIUM: 144 mmol/L (ref 136–145)

## 2013-12-29 LAB — MAGNESIUM: Magnesium: 2.5 mg/dL — ABNORMAL HIGH

## 2013-12-30 LAB — BASIC METABOLIC PANEL
Anion Gap: 5 — ABNORMAL LOW (ref 7–16)
BUN: 16 mg/dL (ref 7–18)
CO2: 25 mmol/L (ref 21–32)
Calcium, Total: 8.8 mg/dL (ref 8.5–10.1)
Chloride: 114 mmol/L — ABNORMAL HIGH (ref 98–107)
Creatinine: 0.89 mg/dL (ref 0.60–1.30)
EGFR (African American): >60
EGFR (Non-African Amer.): >60
Glucose: 97 mg/dL (ref 65–99)
OSMOLALITY: 288 (ref 275–301)
Potassium: 3.3 mmol/L — ABNORMAL LOW (ref 3.5–5.1)
Sodium: 144 mmol/L (ref 136–145)

## 2013-12-30 LAB — PHOSPHORUS: Phosphorus: 2.9 mg/dL (ref 2.5–4.9)

## 2013-12-30 LAB — ALBUMIN: Albumin: 2.9 g/dL — ABNORMAL LOW (ref 3.4–5.0)

## 2013-12-30 LAB — MAGNESIUM: MAGNESIUM: 2.5 mg/dL — AB

## 2013-12-31 ENCOUNTER — Inpatient Hospital Stay: Payer: Self-pay | Admitting: Psychiatry

## 2014-02-09 ENCOUNTER — Emergency Department: Payer: Self-pay | Admitting: Emergency Medicine

## 2014-02-09 LAB — URINALYSIS, COMPLETE
BACTERIA: NONE SEEN
BILIRUBIN, UR: NEGATIVE
BLOOD: NEGATIVE
Glucose,UR: NEGATIVE mg/dL (ref 0–75)
Ketone: NEGATIVE
LEUKOCYTE ESTERASE: NEGATIVE
NITRITE: NEGATIVE
Ph: 6 (ref 4.5–8.0)
Protein: NEGATIVE
RBC,UR: NONE SEEN /HPF (ref 0–5)
Specific Gravity: 1.002 (ref 1.003–1.030)
Squamous Epithelial: <1
WBC UR: <1 /HPF (ref 0–5)

## 2014-02-09 LAB — DRUG SCREEN, URINE

## 2014-02-09 LAB — COMPREHENSIVE METABOLIC PANEL
ALK PHOS: 86 U/L
ANION GAP: 10 (ref 7–16)
AST: 32 U/L (ref 15–37)
Albumin: 4 g/dL (ref 3.4–5.0)
BUN: 5 mg/dL — ABNORMAL LOW (ref 7–18)
Bilirubin,Total: 0.3 mg/dL (ref 0.2–1.0)
CREATININE: 0.93 mg/dL (ref 0.60–1.30)
Calcium, Total: 8.6 mg/dL (ref 8.5–10.1)
Chloride: 107 mmol/L (ref 98–107)
Co2: 24 mmol/L (ref 21–32)
EGFR (Non-African Amer.): >60
Glucose: 102 mg/dL — ABNORMAL HIGH (ref 65–99)
OSMOLALITY: 279 (ref 275–301)
Potassium: 3.5 mmol/L (ref 3.5–5.1)
SGPT (ALT): 40 U/L
Sodium: 141 mmol/L (ref 136–145)
TOTAL PROTEIN: 7.7 g/dL (ref 6.4–8.2)

## 2014-02-09 LAB — ETHANOL
ETHANOL LVL: 296 mg/dL
Ethanol %: 0.296 % — ABNORMAL HIGH (ref 0.000–0.080)

## 2014-02-09 LAB — SALICYLATE LEVEL: Salicylates, Serum: 3.8 mg/dL — ABNORMAL HIGH

## 2014-02-09 LAB — CBC
HCT: 44 % (ref 40.0–52.0)
HGB: 15 g/dL (ref 13.0–18.0)
MCH: 32.8 pg (ref 26.0–34.0)
MCHC: 34.1 g/dL (ref 32.0–36.0)
MCV: 96 fL (ref 80–100)
PLATELETS: 224 10*3/uL (ref 150–440)
RBC: 4.57 10*6/uL (ref 4.40–5.90)
RDW: 13.5 % (ref 11.5–14.5)
WBC: 8 10*3/uL (ref 3.8–10.6)

## 2014-02-09 LAB — ACETAMINOPHEN LEVEL

## 2014-02-15 ENCOUNTER — Emergency Department: Payer: Self-pay | Admitting: Emergency Medicine

## 2014-02-15 LAB — URINALYSIS, COMPLETE
Bacteria: NONE SEEN
Bilirubin,UR: NEGATIVE
GLUCOSE, UR: NEGATIVE mg/dL (ref 0–75)
Ketone: NEGATIVE
Leukocyte Esterase: NEGATIVE
Nitrite: NEGATIVE
PH: 6 (ref 4.5–8.0)
Protein: NEGATIVE
Specific Gravity: 1.003 (ref 1.003–1.030)
Squamous Epithelial: NONE SEEN
WBC UR: <1 /HPF (ref 0–5)

## 2014-02-15 LAB — CBC
HCT: 46 % (ref 40.0–52.0)
HGB: 15.4 g/dL (ref 13.0–18.0)
MCH: 32.3 pg (ref 26.0–34.0)
MCHC: 33.4 g/dL (ref 32.0–36.0)
MCV: 97 fL (ref 80–100)
Platelet: 218 10*3/uL (ref 150–440)
RBC: 4.76 10*6/uL (ref 4.40–5.90)
RDW: 13.9 % (ref 11.5–14.5)
WBC: 8.8 10*3/uL (ref 3.8–10.6)

## 2014-02-15 LAB — ETHANOL
Ethanol %: 0.266 % — ABNORMAL HIGH (ref 0.000–0.080)
Ethanol: 266 mg/dL

## 2014-02-15 LAB — COMPREHENSIVE METABOLIC PANEL
ALBUMIN: 3.9 g/dL (ref 3.4–5.0)
ANION GAP: 13 (ref 7–16)
Alkaline Phosphatase: 80 U/L
BUN: 6 mg/dL — ABNORMAL LOW (ref 7–18)
Bilirubin,Total: 0.2 mg/dL (ref 0.2–1.0)
CALCIUM: 8 mg/dL — AB (ref 8.5–10.1)
CHLORIDE: 108 mmol/L — AB (ref 98–107)
CO2: 21 mmol/L (ref 21–32)
Creatinine: 1.05 mg/dL (ref 0.60–1.30)
EGFR (African American): >60
EGFR (Non-African Amer.): >60
Glucose: 109 mg/dL — ABNORMAL HIGH (ref 65–99)
Osmolality: 281 (ref 275–301)
POTASSIUM: 4 mmol/L (ref 3.5–5.1)
SGOT(AST): 41 U/L — ABNORMAL HIGH (ref 15–37)
SGPT (ALT): 40 U/L
SODIUM: 142 mmol/L (ref 136–145)
Total Protein: 8 g/dL (ref 6.4–8.2)

## 2014-02-15 LAB — DRUG SCREEN, URINE

## 2014-02-15 LAB — ACETAMINOPHEN LEVEL: Acetaminophen: <2 ug/mL

## 2014-02-15 LAB — SALICYLATE LEVEL: Salicylates, Serum: 3.3 mg/dL — ABNORMAL HIGH

## 2014-03-01 ENCOUNTER — Emergency Department: Payer: Self-pay | Admitting: Emergency Medicine

## 2014-03-01 LAB — CBC
HCT: 44.6 % (ref 40.0–52.0)
HGB: 14.9 g/dL (ref 13.0–18.0)
MCH: 32.5 pg (ref 26.0–34.0)
MCHC: 33.4 g/dL (ref 32.0–36.0)
MCV: 97 fL (ref 80–100)
PLATELETS: 210 10*3/uL (ref 150–440)
RBC: 4.59 10*6/uL (ref 4.40–5.90)
RDW: 13.3 % (ref 11.5–14.5)
WBC: 11.1 10*3/uL — AB (ref 3.8–10.6)

## 2014-03-01 LAB — URINALYSIS, COMPLETE
BLOOD: NEGATIVE
Bacteria: NONE SEEN
Bilirubin,UR: NEGATIVE
GLUCOSE, UR: NEGATIVE mg/dL (ref 0–75)
Ketone: NEGATIVE
Leukocyte Esterase: NEGATIVE
NITRITE: NEGATIVE
Ph: 6 (ref 4.5–8.0)
Protein: NEGATIVE
RBC,UR: <1 /HPF (ref 0–5)
Specific Gravity: 1.002 (ref 1.003–1.030)
Squamous Epithelial: NONE SEEN
WBC UR: NONE SEEN /HPF (ref 0–5)

## 2014-03-01 LAB — COMPREHENSIVE METABOLIC PANEL
ALBUMIN: 4 g/dL (ref 3.4–5.0)
ALK PHOS: 89 U/L
ANION GAP: 14 (ref 7–16)
BILIRUBIN TOTAL: 0.3 mg/dL (ref 0.2–1.0)
BUN: 10 mg/dL (ref 7–18)
Calcium, Total: 7.6 mg/dL — ABNORMAL LOW (ref 8.5–10.1)
Chloride: 101 mmol/L (ref 98–107)
Co2: 19 mmol/L — ABNORMAL LOW (ref 21–32)
Creatinine: 0.89 mg/dL (ref 0.60–1.30)
EGFR (Non-African Amer.): >60
Glucose: 100 mg/dL — ABNORMAL HIGH (ref 65–99)
Osmolality: 267 (ref 275–301)
Potassium: 3.7 mmol/L (ref 3.5–5.1)
SGOT(AST): 40 U/L — ABNORMAL HIGH (ref 15–37)
SGPT (ALT): 48 U/L
Sodium: 134 mmol/L — ABNORMAL LOW (ref 136–145)
Total Protein: 7.8 g/dL (ref 6.4–8.2)

## 2014-03-01 LAB — DRUG SCREEN, URINE

## 2014-03-01 LAB — SALICYLATE LEVEL: Salicylates, Serum: 3 mg/dL — ABNORMAL HIGH

## 2014-03-01 LAB — ETHANOL: Ethanol: 313 mg/dL

## 2014-03-01 LAB — ACETAMINOPHEN LEVEL: Acetaminophen: <2 ug/mL

## 2014-05-16 ENCOUNTER — Emergency Department: Payer: Self-pay | Admitting: Student

## 2014-06-05 ENCOUNTER — Inpatient Hospital Stay: Payer: Self-pay | Admitting: Psychiatry

## 2014-06-05 LAB — DRUG SCREEN, URINE
AMPHETAMINES, UR SCREEN: NEGATIVE (ref ?–1000)
BENZODIAZEPINE, UR SCRN: NEGATIVE (ref ?–200)
Barbiturates, Ur Screen: NEGATIVE (ref ?–200)
Cannabinoid 50 Ng, Ur ~~LOC~~: NEGATIVE (ref ?–50)
Cocaine Metabolite,Ur ~~LOC~~: NEGATIVE (ref ?–300)
MDMA (ECSTASY) UR SCREEN: NEGATIVE (ref ?–500)
Methadone, Ur Screen: NEGATIVE (ref ?–300)
Opiate, Ur Screen: NEGATIVE (ref ?–300)
Phencyclidine (PCP) Ur S: NEGATIVE (ref ?–25)
Tricyclic, Ur Screen: NEGATIVE (ref ?–1000)

## 2014-06-05 LAB — URINALYSIS, COMPLETE
BILIRUBIN, UR: NEGATIVE
Bacteria: NONE SEEN
GLUCOSE, UR: NEGATIVE mg/dL (ref 0–75)
KETONE: NEGATIVE
LEUKOCYTE ESTERASE: NEGATIVE
Nitrite: NEGATIVE
Ph: 6 (ref 4.5–8.0)
Protein: NEGATIVE
RBC,UR: <1 /HPF (ref 0–5)
SPECIFIC GRAVITY: 1.005 (ref 1.003–1.030)
Squamous Epithelial: <1

## 2014-06-05 LAB — ETHANOL
ETHANOL LVL: 270 mg/dL
Ethanol: 112 mg/dL

## 2014-06-05 LAB — COMPREHENSIVE METABOLIC PANEL
ALBUMIN: 4 g/dL (ref 3.4–5.0)
ALK PHOS: 100 U/L
ALT: 54 U/L
ANION GAP: 9 (ref 7–16)
BUN: 7 mg/dL (ref 7–18)
Bilirubin,Total: 0.3 mg/dL (ref 0.2–1.0)
CALCIUM: 9.1 mg/dL (ref 8.5–10.1)
CHLORIDE: 105 mmol/L (ref 98–107)
Co2: 23 mmol/L (ref 21–32)
Creatinine: 0.93 mg/dL (ref 0.60–1.30)
EGFR (Non-African Amer.): >60
GLUCOSE: 135 mg/dL — AB (ref 65–99)
OSMOLALITY: 274 (ref 275–301)
Potassium: 3.5 mmol/L (ref 3.5–5.1)
SGOT(AST): 42 U/L — ABNORMAL HIGH (ref 15–37)
Sodium: 137 mmol/L (ref 136–145)
Total Protein: 8.1 g/dL (ref 6.4–8.2)

## 2014-06-05 LAB — CBC
HCT: 46.5 % (ref 40.0–52.0)
HGB: 16.1 g/dL (ref 13.0–18.0)
MCH: 33.6 pg (ref 26.0–34.0)
MCHC: 34.7 g/dL (ref 32.0–36.0)
MCV: 97 fL (ref 80–100)
Platelet: 224 10*3/uL (ref 150–440)
RBC: 4.8 10*6/uL (ref 4.40–5.90)
RDW: 13 % (ref 11.5–14.5)
WBC: 10 10*3/uL (ref 3.8–10.6)

## 2014-06-05 LAB — SALICYLATE LEVEL: Salicylates, Serum: 13.7 mg/dL — ABNORMAL HIGH

## 2014-06-05 LAB — ACETAMINOPHEN LEVEL: Acetaminophen: <2 ug/mL

## 2014-07-10 ENCOUNTER — Emergency Department: Payer: Self-pay | Admitting: Emergency Medicine

## 2014-07-11 LAB — CBC
HCT: 45.1 % (ref 40.0–52.0)
HGB: 15.5 g/dL (ref 13.0–18.0)
MCH: 33.1 pg (ref 26.0–34.0)
MCHC: 34.3 g/dL (ref 32.0–36.0)
MCV: 97 fL (ref 80–100)
PLATELETS: 187 10*3/uL (ref 150–440)
RBC: 4.67 10*6/uL (ref 4.40–5.90)
RDW: 13.4 % (ref 11.5–14.5)
WBC: 9.8 10*3/uL (ref 3.8–10.6)

## 2014-07-11 LAB — COMPREHENSIVE METABOLIC PANEL
ALBUMIN: 4.1 g/dL (ref 3.4–5.0)
ALT: 57 U/L
Alkaline Phosphatase: 88 U/L
Anion Gap: 10 (ref 7–16)
BUN: 6 mg/dL — AB (ref 7–18)
Bilirubin,Total: 0.3 mg/dL (ref 0.2–1.0)
CO2: 22 mmol/L (ref 21–32)
CREATININE: 0.8 mg/dL (ref 0.60–1.30)
Calcium, Total: 8.2 mg/dL — ABNORMAL LOW (ref 8.5–10.1)
Chloride: 106 mmol/L (ref 98–107)
EGFR (African American): >60
Glucose: 96 mg/dL (ref 65–99)
OSMOLALITY: 273 (ref 275–301)
Potassium: 3.7 mmol/L (ref 3.5–5.1)
SGOT(AST): 45 U/L — ABNORMAL HIGH (ref 15–37)
SODIUM: 138 mmol/L (ref 136–145)
Total Protein: 8.2 g/dL (ref 6.4–8.2)

## 2014-07-11 LAB — SALICYLATE LEVEL: Salicylates, Serum: 3.1 mg/dL — ABNORMAL HIGH

## 2014-07-11 LAB — ETHANOL: Ethanol: 289 mg/dL

## 2014-07-11 LAB — ACETAMINOPHEN LEVEL: Acetaminophen: <2 ug/mL

## 2014-07-14 ENCOUNTER — Emergency Department: Payer: Self-pay | Admitting: Emergency Medicine

## 2014-07-14 LAB — COMPREHENSIVE METABOLIC PANEL
ALT: 58 U/L
Albumin: 4 g/dL (ref 3.4–5.0)
Alkaline Phosphatase: 92 U/L
Anion Gap: 11 (ref 7–16)
BILIRUBIN TOTAL: 0.2 mg/dL (ref 0.2–1.0)
BUN: 7 mg/dL (ref 7–18)
CO2: 22 mmol/L (ref 21–32)
CREATININE: 0.84 mg/dL (ref 0.60–1.30)
Calcium, Total: 8.2 mg/dL — ABNORMAL LOW (ref 8.5–10.1)
Chloride: 109 mmol/L — ABNORMAL HIGH (ref 98–107)
Glucose: 109 mg/dL — ABNORMAL HIGH (ref 65–99)
Osmolality: 282 (ref 275–301)
Potassium: 3.7 mmol/L (ref 3.5–5.1)
SGOT(AST): 46 U/L — ABNORMAL HIGH (ref 15–37)
Sodium: 142 mmol/L (ref 136–145)
Total Protein: 8.2 g/dL (ref 6.4–8.2)

## 2014-07-14 LAB — URINALYSIS, COMPLETE
BACTERIA: NONE SEEN
Bilirubin,UR: NEGATIVE
GLUCOSE, UR: NEGATIVE mg/dL (ref 0–75)
Ketone: NEGATIVE
Leukocyte Esterase: NEGATIVE
NITRITE: NEGATIVE
PROTEIN: NEGATIVE
Ph: 6 (ref 4.5–8.0)
RBC,UR: <1 /HPF (ref 0–5)
SPECIFIC GRAVITY: 1.003 (ref 1.003–1.030)
SQUAMOUS EPITHELIAL: NONE SEEN

## 2014-07-14 LAB — DRUG SCREEN, URINE
Amphetamines, Ur Screen: NEGATIVE (ref ?–1000)
BENZODIAZEPINE, UR SCRN: NEGATIVE (ref ?–200)
Barbiturates, Ur Screen: NEGATIVE (ref ?–200)
COCAINE METABOLITE, UR ~~LOC~~: NEGATIVE (ref ?–300)
Cannabinoid 50 Ng, Ur ~~LOC~~: NEGATIVE (ref ?–50)
MDMA (Ecstasy)Ur Screen: NEGATIVE (ref ?–500)
METHADONE, UR SCREEN: NEGATIVE (ref ?–300)
Opiate, Ur Screen: NEGATIVE (ref ?–300)
Phencyclidine (PCP) Ur S: NEGATIVE (ref ?–25)
TRICYCLIC, UR SCREEN: NEGATIVE (ref ?–1000)

## 2014-07-14 LAB — CBC
HCT: 47.2 % (ref 40.0–52.0)
HGB: 15.9 g/dL (ref 13.0–18.0)
MCH: 33 pg (ref 26.0–34.0)
MCHC: 33.6 g/dL (ref 32.0–36.0)
MCV: 98 fL (ref 80–100)
Platelet: 212 10*3/uL (ref 150–440)
RBC: 4.81 10*6/uL (ref 4.40–5.90)
RDW: 13 % (ref 11.5–14.5)
WBC: 8.9 10*3/uL (ref 3.8–10.6)

## 2014-07-14 LAB — SALICYLATE LEVEL: Salicylates, Serum: 3.2 mg/dL — ABNORMAL HIGH

## 2014-07-14 LAB — ACETAMINOPHEN LEVEL

## 2014-07-14 LAB — ETHANOL: Ethanol: 299 mg/dL

## 2014-10-25 NOTE — Discharge Summary (Signed)
PATIENT NAME:  Jesse Obrien, Jesse Obrien MR#:  [id number] DATE OF BIRTH:  December 14, 1960  DATE OF ADMISSION:  12/20/2013 DATE OF DISCHARGE:  12/31/2013  ADDENDUM:  I was informed by social worker that [facility] is on diversion and is unable to transfer patient there.  Hence, I spoke with Dr. Bary Leriche who will take patient downstairs in behavioral medicine when bed opens up hopefully today.    ____________________________ Gus Height A. Posey Pronto, MD sap:dd D: 12/31/2013 12:13:00 ET T: 12/31/2013 19:31:14 ET JOB#: [id number]  cc: Sona A. Posey Pronto, MD, <Dictator> Ilda Basset MD ELECTRONICALLY SIGNED 01/04/2014 10:34

## 2014-10-25 NOTE — Discharge Summary (Signed)
PATIENT NAME:  Jesse Obrien, Jesse Obrien MR#:  [id number] DATE OF BIRTH:  [birth year]  DATE OF ADMISSION:  11/19/2013 DATE OF DISCHARGE: 11/21/2013   HOSPITAL COURSE: See dictated history and physical for details of admission. A 54 year old man with a history of binge drinking, came in to the hospital after having been drinking heavily. He had also cut himself superficially on the wrist and allegedly said he was going to cut his throat. In the hospital, he was treated with the regular detox protocol. He did not show any physical signs of alcohol withdrawal. No seizures, no sign of delirium. No tremors. Vital signs stable. He did not require any medications specifically for detox. He showed a slightly blunted affect and said that his mood stays a little bit bad, which primarily blamed on his chronic pain. He totally denied any suicidal ideation and did not display any suicidal or dangerous behavior in the hospital. The patient requested that he be discharged. He was educated about alcohol dependence and benefits of substance abuse treatment programming and offered the opportunity for referral to inpatient programming. He declines, although he says that he is planning now to go follow up with [facility] substance abuse program and is also open to referral to local [facility] services. Additionally, I have started him on a low dose of Cymbalta 30 mg a day as well as 300 mg of gabapentin 3 times a day for his pain and, what I think is probably, some degree of depression. He has tolerated those okay for the first day with no side effects and agrees to continue his medicine. Low-dose of Restoril provided as a sleeping medicine only.   MENTAL STATUS EXAM AT DISCHARGE: Neatly groomed gentleman who looks his stated age or older, cooperative with the interview. Eye contact good. Psychomotor activity calm and normal. Speech normal rate, tone and volume. Affect slightly blunted. Mood stated as okay. Thoughts are lucid. No loosening of  associations or delusions. Denies auditory or visual hallucinations. Denies suicidal or homicidal ideation. Shows adequate judgment and insight. No sign of psychosis. Normal intelligence. Alert and oriented x 4. Short and long-term memory intact.   DISCHARGE MEDICATIONS: Duloxetine 30 mg p.o. daily, gabapentin 300 mg 3 times a day and Restoril 15 mg at bedtime p.r.n. sleep.   LABORATORY RESULTS: Admission labs from the 18th include chemistry panel with a low potassium 3.4, calcium slightly low 8.2, AST slightly elevated at 57. Alcohol level 281. Drug screen all negative. CBC all normal.   DISPOSITION: Discharge home. Follow up with the [place] or with local mental health and he has also been given information about other substance abuse treatment programs.   DIAGNOSIS, PRINCIPAL AND PRIMARY:  AXIS I: Depression, not otherwise specified.  SECONDARY DIAGNOSES:  AXIS I: Alcohol dependence.  AXIS II: Deferred.  AXIS III: Chronic pain.  AXIS IV: Moderate from chronic pain.  AXIS V: Functioning at time of discharge 55.  ____________________________ Gonzella Lex, MD jtc:aw D: 11/21/2013 12:26:56 ET T: 11/21/2013 13:15:54 ET JOB#: [id number]  cc: Gonzella Lex, MD, <Dictator> Gonzella Lex MD ELECTRONICALLY SIGNED 12/05/2013 11:37

## 2014-10-25 NOTE — Consult Note (Signed)
PATIENT NAME:  Jesse Obrien, Jesse Obrien MR#:  [id number] DATE OF BIRTH:  08-May-1961  DATE OF CONSULTATION:  06/05/2014  REFERRING PHYSICIAN:   CONSULTING PHYSICIAN:  Gonzella Lex, MD  IDENTIFYING INFORMATION AND REASON FOR CONSULTATION: This is a 54 year old male with a history of alcohol abuse and depression who came into the hospital with chief complaint, "to stop drinking".   HISTORY OF PRESENT ILLNESS: Information obtained from the patient and the chart. Old chart reviewed. The patient interviewed. The patient states that he has a binge drinking pattern and that he has drank a case of beer over about the last day or so. Prior to that it had been 4 days or so since he was last drinking. I tried to get him to be more clear about the pattern, but he is pretty slow to give history. Based on his past history it does look like this is a recurrent binge thing. He denies that the problems are getting any worse. Denies any history of seizures or DTs. Does state that while his mood has been okay that he has had suicidal thoughts recently. Not stating any specific plan. The patient complains bitterly of his chronic back pain, which in his mind drives the rest of his problems. He is not currently getting any treatment for it. That is the biggest stress that he can identify.   PAST PSYCHIATRIC HISTORY: The patient has had several prior admissions to our hospital under similar circumstances with alcohol intoxication and depressed mood. He does have a history of suicide attempts by overdose and by self-mutilation in the past. According to the notes we have last time he was inpatient he was transferred to the [facility]. He has had several Emergency Room visits since then. The patient has been prescribed antidepressants in the past, which he is not reporting right now. Unclear if it was ever of any help.   SOCIAL HISTORY: The patient is a retired Scientist, research (life sciences). Lives with his wife. Very little social activity. Sounds like  he is pretty idle during the day.   PAST MEDICAL HISTORY: Chronic back pain. It is noted in his most recent discharge that he has a history of a chronic brain tumor; some kind of a benign tumor presumably. Unknown if that had been resected in the past. He denies any history of seizures. Also had been noted in the past that he had high blood pressure.   FAMILY HISTORY: Says he has several people in his family with alcohol abuse.   CURRENT MEDICATIONS: The patient says he is not taking anything.   ALLERGIES: No known drug allergies.   REVIEW OF SYSTEMS: The patient is feeling tired. Having some passive suicidal ideation. Denies hallucinations. Not being forthcoming in reporting any other positive complaints.   MENTAL STATUS EXAMINATION: Reasonably well groomed gentleman who looks his stated age. The patient still smells like ketones and looks like he is still coming down from the alcohol. Eyes are red. He is very passive in his participation with the interview. He makes good eye contact. Psychomotor activity is very slow, sluggish and minimal. Affect is flat. Mood is stated as being a 7 out of 10. He says he has had some suicidal thoughts recently, does not have any acute plan. Denies auditory or visual hallucinations. He is alert and oriented x4. Recalls 3/3 objects immediately and at three minutes. Judgment and insight chronically somewhat impaired. Fund of knowledge and intelligence normal.   VITAL SIGNS: Most recent Emergency Room vitals: Blood  pressure 94/64, pulse 100, respirations 20, temperature 98.1.   LABORATORY RESULTS: Alcohol level at 4:43 this morning was 270. Slightly elevated glucose 135. Slightly elevated AST at 42. Drug screen is all negative. Hematology panel is unremarkable. Urinalysis: Positive blood but no sign of infection, only 1+. Elevated salicylates, 64.6. Acetaminophen low at 2.   ASSESSMENT: A 54 year old man with alcohol abuse, history of depression and mood instability,  comes back to the hospital requesting help getting off of alcohol. He does not have any clearly formulated plan for how he can do that. Very passive and probably still intoxicated. The patient does have a history of suicide attempts in the past. Warrants hospitalization for safety and stabilization.   TREATMENT PLAN: Admit to psychiatry. Detox orders all in place. Seizure, fall, and suicide precautions in place. Primary team downstairs can work with him on coming up with a more complete treatment plan. The patient was counseled about the importance of being more positive in dealing with his pain if he wants to stay sober.   DIAGNOSIS, PRINCIPAL AND PRIMARY:  AXIS I: Alcohol abuse, severe.   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  AXIS II: Deferred.  AXIS III: History of benign brain tumor by history, alcohol intoxication.   ____________________________ Gonzella Lex, MD jtc:sb D: 06/05/2014 12:08:48 ET T: 06/05/2014 12:23:30 ET JOB#: [id number]  cc: Gonzella Lex, MD, <Dictator> Gonzella Lex MD ELECTRONICALLY SIGNED 06/15/2014 11:15

## 2014-10-25 NOTE — Consult Note (Signed)
PATIENT NAME:  THAINE, Jesse Obrien MR#:  [id number] DATE OF BIRTH:  1961-05-18  DATE OF CONSULTATION:  02/09/2014  REFERRING PHYSICIAN:    CONSULTING PHYSICIAN:  Atonya Templer K. Franchot Mimes, MD  PLACE OF DICTATION:  [facility] Room, [place] [address], [place], [place].   SUBJECTIVE:  Patient was seen in consultation in room [facility] ER, [place], [place], [place].   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  The patient married for 11 years, and lives with his wife.  Patient comes to [facility] with a chief complaint of drinking alcohol at the rate of pack of beer a day for quite some time.   ALCOHOL AND DRUGS: No previous history of DWIs, had 1 DWI many years ago. Never had a history of public drunkenness; denies any other street or prescription drug abuse, does admit smoking nicotine cigarettes at a rate of pack a day for many years.    According to the information obtained from the staff. Patient had been to the [facility] Emergency Room 11 times in the past 12 months for similar problems. Patient is aware that [facility] is available and [facility] is available for help, but today he reports that he wants to take help from [facility], and he wants to know the address and how to contact them.   MENTAL STATUS EXAMINATION:  Patient is alert and oriented to place, person, and time, calm, pleasant, and cooperative, no agitation; denies feeling hopeless or helpless, except he feels alone and down occasionally; no psychosis, denies auditory or visual hallucinations, denies hearing voices or paranoid or suspicious ideas. Cognition intact.  Denies suicidal or homicidal plans, contracts for safety, eager to go home to be with his wife, and take help from [facility] so that he can be sober, and quit drinking alcohol.   IMPRESSION:  1.  Alcohol abuse with intoxication.    2.  Substance induced mood disorder,  stable, and contracts for safety.    RECOMMEND:  Discontinue involuntary commitment, and discharge patient to go back home; patient will be given telephone numbers to contact AA so that he can get help.    ____________________________ Wallace Cullens. Franchot Mimes, MD skc:nt D: 02/09/2014 18:00:10 ET T: 02/09/2014 18:32:35 ET JOB#: [id number]  cc: Arlyn Leak K. Franchot Mimes, MD, <Dictator> Dewain Penning MD ELECTRONICALLY SIGNED 02/15/2014 17:15

## 2014-10-25 NOTE — Consult Note (Signed)
PATIENT NAME:  Jesse, Obrien MR#:  [id number] DATE OF BIRTH:  09/05/1960  DATE OF CONSULTATION:  03/01/2014  REFERRING PHYSICIAN:   CONSULTING PHYSICIAN:  Babbette Dalesandro K. Shantelle Alles, MD  SUBJECTIVE:  The patient was seen in consultation at the [facility] Emergency Room.  The patient is a 54 year old white male, not employed, and retired from [employer] and had an honorable discharge, and he was a Network engineer.  The patient is married for 11 years and lives with his wife.  CHIEF COMPLAINT:  "I drink too much beer, not enough beer.  I've been drinking for many years and I need help for the same."    HISTORY OF PRESENT ILLNESS:  The patient reports that he has been drinking too much beer but still it is not enough.  He decided that he needs help and came to the hospital for help for the same.  PAST PSYCHIATRIC HISTORY:  History of inpatient hospitalization on psychiatry for alcohol detox on many occasions, probably 4 or 5 times.  Each time he goes to detox he stays sober for 7 or 8 months and starts drinking again and has no specific stress.    ALCOHOL AND DRUG HISTORY:  Admits that he is drinking too much beer.  Denies any street or prescription drug abuse.  Does admit smoking nicotine cigarettes, a pack a day for many years.    MENTAL STATUS EXAMINATION:  The patient is alert and oriented to place, person, and time, calm, pleasant, and cooperative.  Upset and frustrated when questions are asked about his alcohol dependence.  Admits that he is frustrated but not depressed.  No psychosis.  Does not appear to respond to internal stimuli.  Cognition intact.  Denies any ideas or plans to hurt himself or others.  Contracts for safety.  Insight and judgment guarded.  Impulse control is fair.    IMPRESSION:  Alcohol dependence, chronic, continues to intoxication, nicotine dependence, substance-induced mood disorder but contracts for safety.    RECOMMENDATIONS:  Discharge patient to [facility] and he will continue on an involuntary  commitment so he can get transportation to [place] and he is eager to get help for his alcohol dependence.   ____________________________ Wallace Cullens. Franchot Mimes, MD skc:nr D: 03/01/2014 14:54:29 ET T: 03/01/2014 18:00:27 ET JOB#: [id number]  cc: Arlyn Leak K. Franchot Mimes, MD, <Dictator> Dewain Penning MD ELECTRONICALLY SIGNED 03/02/2014 15:46

## 2014-10-25 NOTE — Consult Note (Signed)
Brief Consult Note: Comments: Psychiatry: I was never notified of this consult yesterday. My appologies for not responding sooner. Patient remains intubated per the current physicians note. Please re-request consult once he is able to verbally interact and is extubated.  Electronic Signatures: Gonzella Lex (MD)  (Signed 20-Jun-15 11:38)  Authored: Brief Consult Note   Last Updated: 20-Jun-15 11:38 by Gonzella Lex (MD)

## 2014-10-25 NOTE — Consult Note (Signed)
Brief Consult Note: Diagnosis: Alcohol dependence, substance-induced mood disorder.   Patient was seen by consultant.   Consult note dictated.   Recommend further assessment or treatment.   Discussed with Attending MD.   Comments: Jesse Obrien has a h/o alcoholism and mood instability. He was admitted after overdose on Ambien prescribed by his PCP while drunk. The patientdenies suicidal or homicidal ideation but desires substance abuse treatment at the [place].  PLAN: 1. The patient is on IVC.  2. His wife declined medical tratnsfer to the [place] on 6/24.   3. They will consider him for psychiatric admission. Referral was made.  4. [facility] not [facility]  Electronic Signatures: Orson Slick (MD)  (Signed 29-Jun-15 12:34)  Authored: Brief Consult Note   Last Updated: 29-Jun-15 12:34 by Orson Slick (MD)

## 2014-10-25 NOTE — Consult Note (Signed)
PATIENT NAME:  Jesse Obrien, Jesse Obrien MR#:  [id number] DATE OF BIRTH:  09/10/1960  DATE OF CONSULTATION:  11/26/2013  CONSULTING PHYSICIAN:  Lincon Sahlin S. Gretel Acre, MD  REASON FOR CONSULTATION: "I want to detox. I want to go to the [place], not here."   HISTORY OF PRESENT ILLNESS: The patient is a 54 year old Caucasian male who was recently discharged from the inpatient behavioral health unit, presented again as he was requesting detox from alcohol. He reported that he drinks about every 3 days 1 case of beer. He reported that he lives with his wife. The patient was recently discharged from the inpatient behavioral health unit where he was admitted due to suicidal ideations and was threatening to cut his throat and his wrists. 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   During my interview, the patient was lying comfortably in the ED. He reported that he does not like the medications which were prescribed to him in the inpatient behavioral health unit. He reported that he started drinking again and used 8 cans of beer. His last drink was yesterday. He stated that he is not having any withdrawal symptoms at this time. He stated that he wants to go to the inpatient [facility]. He does not have a ride to the hospital. He currently lives with his wife. He stated that he goes to the [facility] in [place] as well as in [place]. He denied having any suicidal or homicidal ideations or plans. He denied having any perceptual disturbances. He contracted for safety at this time.   PAST PSYCHIATRIC HISTORY:  The patient recently discharged from the inpatient behavioral health unit approximately 3 days ago. He also has prior hospitalizations in 2012 and 2007. He has been diagnosed with depression in the past at [facility] and has history of alcohol use. He has history of depression and insomnia. The patient did not keep his appointments in the past. He continues to have problems with alcohol  dependence. No history of suicide attempt in the past.   FAMILY HISTORY:   The patient reported that both parents are alcoholics.   PAST MEDICAL HISTORY:  Hypertension, history of benign brain tumor, chronic back pain.   CURRENT MEDICATIONS:  Duloxetine 30 mg p.o. daily, gabapentin 300 mg t.i.d., Zestril 15 mg at bedtime p.r.n.   REVIEW OF SYSTEMS:  CONSTITUTIONAL:  Denies any fever or chills. No weight changes.  EYES:  No double or blurred vision.  RESPIRATORY:  No shortness of breath or cough.  ENT:  No hearing loss. CARDIOVASCULAR:  Denies any chest pain or orthopnea.  GASTROINTESTINAL:  No abdominal pain, nausea, vomiting or diarrhea.  GENITOURINARY:  No incontinence or frequency.  ENDOCRINE:  No heat or cold intolerance.  LYMPHATIC:  No anemia or easy bruising.  INTEGUMENTARY:  No acne or rash.  MUSCULOSKELETAL:  History of back pain.  NEUROLOGIC:  History of benign tumor.   PHYSICAL EXAMINATION: VITAL SIGNS:  Temperature 97.4, pulse 77, respirations 19, blood pressure 107/60.   LABORATORY DATA:  Glucose 124, BUN 7, creatinine 0.84, sodium 134, potassium 3.5, chloride 102, bicarbonate 24, anion gap 8, osmolality 268, calcium 8.0. Blood alcohol level 261. Protein 7.3, albumin 3.7, bilirubin 0.2, alkaline phosphatase 71, AST 42, ALT 53. UDS is negative. WBC is 12.0. RBC is 4.3, hemoglobin 14.4, hematocrit 42.4. MCV is 99. RDW is 13.0.   MENTAL STATUS EXAMINATION:  The patient is a moderately-built male who was lying comfortably in the bed. His  muscle tone appears normal. Gait and station were normal. His speech was low in tone and volume. Thought process was logical, goal-directed. Thought content was nondelusional. No loose associations are noted. Insight and judgment regarding his use of alcohol was fair.  He was awake, alert and oriented x 3. Mood was fine. Affect was congruent. Thought process was logical, goal-directed. Fund of knowledge appears appropriate.   DIAGNOSTIC  IMPRESSION: AXIS I: 1.  Alcohol dependence.  2.  Major depressive disorder, recurrent, moderate.  AXIS II:  Cluster B personality disorder.  AXIS III:  Hypertension, benign brain tumor, history of back pain.   TREATMENT PLAN: 1.  The patient will be discharged from the Emergency Room as he does not meet the criteria for inpatient hospitalization and was recently discharged 3 days ago.  2.  The patient has currently supply of his medications. He will be provided information to follow with the [facility] in [place] or in [place]. I advised the patient to come back if he notices worsening of his symptoms and he demonstrated understanding.   Thank you for allowing me to participate in the care of this patient.   ____________________________ Cordelia Pen. Gretel Acre, MD usf:dmm D: 11/26/2013 11:40:28 ET T: 11/26/2013 12:08:28 ET JOB#: [id number]  cc: Cordelia Pen. Gretel Acre, MD, <Dictator> Jeronimo Norma MD ELECTRONICALLY SIGNED 11/26/2013 16:04
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
## 2014-10-25 NOTE — Consult Note (Signed)
PATIENT NAME:  Jesse Obrien, Jesse Obrien MR#:  [id number] DATE OF BIRTH:  28-Feb-1961  DATE OF CONSULTATION:  02/15/2014  CONSULTING PHYSICIAN:  Vermelle Cammarata K. Franchot Mimes, MD  PLACE OF DICTATION:  [facility], [place] [address], [place], [place].  AGE:  55 years.  SEX:  Male.  RACE:  White.  SUBJECTIVE:  The patient was seen in consultation in room [place], [place], [place].  The patient is a 54 year old white male.  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  The patient is married for many years and lives with his wife.  The patient has long history of alcohol drinking problem and came here stating, "I need to quit drinking."    HISTORY OF PRESENT ILLNESS:  The patient reports that he had been drinking at the rate of 19-20 beers a day.  He had back problems and he went and bought that 12 pack and realized that he needed help with the same.  PAST PSYCHIATRIC HISTORY:  History of inpatient hospitalization for detoxification of alcohol on many occasions.  He stays sober for 2 weeks and then starts drinking again.  Had 3 DWIs in the past. Never arrested for public drunkenness.  Denies any other street  or IV drug abuse. Denies smoking nicotine cigarettes.  MENTAL STATUS:  The patient is dressed in hospital clothes, alert and oriented to person, place and time, fully aware of situation that brought him here.  Affect is neutral.  Mood is stable.  Denies feeling depressed.  Denies feeling hopeless or helpless.  Denies feeling worthless or useless.  No psychosis.  Does not appear to respond to internal stimuli.  Cognition intact.  General fund of knowledge of information is fair.  Insight and judgment guarded.  Impulse control is poor.  IMPRESSION:  Alcohol dependance, chronic continuous with intoxication which is resolving,  substance abuse, mood disorder.  RECOMMENDATIONS:  The patient is stable and he is eager to go back home and get help from  [place] and realizes that he needs to have a sponsor  and followup AA meetings so that he can quit drinking alcohol as per wishes.     ____________________________ Wallace Cullens. Franchot Mimes, MD skc:DT D: 02/15/2014 17:58:19 ET T: 02/15/2014 19:00:43 ET JOB#: [id number]  cc: Arlyn Leak K. Franchot Mimes, MD, <Dictator> Dewain Penning MD ELECTRONICALLY SIGNED 02/16/2014 16:07

## 2014-10-25 NOTE — Consult Note (Signed)
Brief Consult Note: Diagnosis: Alcohol dependence, substance-induced mood disorder.   Patient was seen by consultant.   Consult note dictated.   Recommend further assessment or treatment.   Discussed with Attending MD.   Comments: Jesse Obrien has a h/o alcoholism and mood instability. He came to the ER threatening suicide while drunk. He is no longer drunk, suicidal or homicidal. He declines substance abuse treatment.  PLAN: 1. The patient no longer meets criteria for IVC. I will terminate proceedings . Please discharge as appropriate.   2. He will follow up with the [facility] for mental health and substance abuse..  Electronic Signatures: Orson Slick (MD)  (Signed 31-Aug-15 15:26)  Authored: Brief Consult Note   Last Updated: 31-Aug-15 15:26 by Orson Slick (MD)

## 2014-10-25 NOTE — H&P (Signed)
PATIENT NAME:  Jesse Obrien, Jesse Obrien MR#:  [id number] DATE OF BIRTH:  Jan 17, 1961  DATE OF ADMISSION:  12/20/2013  PRIMARY CARE PHYSICIAN:  Nonlocal.   REFERRING PHYSICIAN:  Dr. Beather Arbour.   CHIEF COMPLAINT:  Intentional drug overdose.   HISTORY OF PRESENT ILLNESS:  The patient is a 54 year old Caucasian male with a past medical history of depression, hypertension, hyperlipidemia, degenerative joint disease and multiple ED visits for behavioral health problems.  He was brought into the ED after he had intentional drug overdose with Ambien.  The patient intentionally overdosed himself with Ambien, following which wife called EMS immediately.  By the time EMS arrived his home, the patient was quite combative and he took another handful of Ambien after their arrival.  The patient is immediately brought into the ED.  By the time he came into the ED, he became unresponsive with no gag reflex.  The patient was intubated prophylactically and gastric lavage was done, but interesting they could not recover any pills.  No family members at bedside.  The patient was started on propofol GTT as the patient was trying to reach and pull the tubes.  The patient had a history of alcohol intoxication and his alcohol level was low at 0.277 and he is started on CIWA protocol.  Hospitalist team is called to admit the patient.  I was unable to get any history as he is intubated.  No family members at bedside.  Wife is aware as she has called the EMS to send the patient to the hospital.  The patient takes Ambien 12.5 mg extended release once daily during bedtime which was filled with 30 tablets on 12/10/2013.  EMS have noticed Ambien bottle next to him with two leftover pills.  PAST MEDICAL HISTORY:  History of hyperlipidemia, hypertension, degenerative joint disease, alcohol intoxication, depression, chronic pain syndrome.   PAST SURGICAL HISTORY:  Not available, unobtainable from the patient.  ALLERGIES:  No known drug allergies.    PSYCHOSOCIAL HISTORY:  Lives with wife.  The patient is retired according to the old records.  He has history of alcohol intoxication.   FAMILY HISTORY:  Nonidentified according to the old records.   REVIEW OF SYSTEMS:  Unobtainable.     HOME MEDICATIONS:  List is unavailable, but we can include Ambien 12.5 mg extended release 1 tablet by mouth at bedtime for sleep.   PHYSICAL EXAMINATION: VITAL SIGNS:  Pulse 78, respirations 19, blood pressure 91/63, pulse ox 93% on vent. GENERAL APPEARANCE:  Not under acute distress.  Moderately built and nourished.  HEENT:  Normocephalic, atraumatic.  Pupils are equally, but sluggishly reacting to light and accommodation.  They are 3 to 4 mm in size.  No scleral icterus.  No conjunctival injection.  Moist mucous membranes.  ET tube is intact.  NECK:  Supple.  No JVD.  LUNGS:  Moderate air entry.  Decreased breath sounds at the bases.  No wheezing.  CARDIOVASCULAR:  S1, S2, normal.  Regular rate and rhythm.  No murmurs.  GASTROINTESTINAL:  Soft.  Bowel sounds are positive in all four quadrants.  Nontender, nondistended.  No masses felt.  NEUROLOGIC:  The patient is currently sedated with propofol.  Reflexes are 2+.  EXTREMITIES:  No edema.  No cyanosis.  No clubbing.  SKIN:  Warm to touch.  Normal turgor.  No rashes or lesions identified.   LABORATORY DATA AND IMAGING STUDIES:  Troponin less than 0.02.  TSH is normal.  Urine drug screen is normal.  CBC  is normal.  Urinalysis is straw-colored, nitrite and leukocyte is negative.  Acetaminophen level less than 2.  Salicylate 3.8.  ABG:  On AC mode with tidal volume at 500, mechanical rate of 14, PEEP of 5 showed lactic acid at 2.1, pH 7.25, pCO2 50, pO2 140, FIO2 50%, bicarb 21.9, pulse ox 96.1.  Chem-8, glucose is normal, BUN 5, creatinine, sodium are normal, potassium 3.5, chloride is normal, CO2 is normal.  GFR greater than 60.  Anion gap is normal.  Serum osmolality 276.  Calcium 8.0.  Lipase is normal.   Serum alcohol percentage is 0.277.   ASSESSMENT AND PLAN:  A 54 year old Caucasian male brought into the Emergency Department after he intentionally overdosed himself with Ambien, will be admitted with the following assessment and plan.  In the Emergency Department, the patient was found unresponsive with no gag reflex and intubated, started on propofol GTT and CIWA protocol.  CT head is negative.  1.  Acute respiratory failure secondary to intentional drug overdose with Ambien CR.  We will admit him to Critical Care Unit.  Currently, the patient is, intubated.  We will continue that and he will be on propofol for sedation.  Intravenous Pepcid for gastrointestinal prophylaxis.  Pulmonology consult is placed.  Psychiatry consult is placed.  Vent management per pulmonology.  2.  History of alcohol intoxication.  The patient is on CIWA protocol.  3.  History of hypertension, hyperlipidemia, and degenerative joint disease.  Currently, the patient is nothing by mouth.  We will monitor his blood pressure and if necessary we will provide intravenous antihypertensives as needed.  4.  We will provide him gastrointestinal and deep vein thrombosis prophylaxis.  5.  CODE STATUS:  HE IS A FULL CODE.  No family members are available.  We will try to reach the wife regarding the patient's situation.    Critical care time spent is 60 minutes.     ____________________________ Nicholes Mango, MD ag:ea D: 12/20/2013 06:15:42 ET T: 12/20/2013 07:02:14 ET JOB#: [id number]  cc: Nicholes Mango, MD, <Dictator> Nicholes Mango MD ELECTRONICALLY SIGNED 12/22/2013 0:58
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
## 2014-10-25 NOTE — H&P (Signed)
PATIENT NAME:  Jesse Obrien, Jesse Obrien MR#:  [id number] DATE OF BIRTH:  1961-06-29  DATE OF ADMISSION:  11/19/2013  IDENTIFYING INFORMATION AND CHIEF COMPLAINT: A 54 year old man came to the Emergency Room intoxicated requesting detox.   CHIEF COMPLAINT: " It causes bad judgment."   HISTORY OF PRESENT ILLNESS: Information obtained from the patient and the chart. He came to the Emergency Room yesterday intoxicated. At the time, he reported that he had threatened to cut his throat while intoxicated. He did not do it, but he did superficially cut himself on the left wrist. On interview today, the patient cannot remember his mental state at the time. He says only that he was drunk and will not acknowledge that there had been any particular emotion connected with it. He tells me that he drinks only about 1 or 2 times a week, but when he does drink it is about a case of beer at a time. He says that it has increased over the years. He will acknowledge that when he is drinking is causes "poor judgment" but the only example, he can come off with is the fact that he came here to the Emergency Room requesting admission.  I pointed out to him that that actually probably counts more as good judgment. He will not admit that it causes emotional problems or stress in the household, although he does say that his wife would like him to stop drinking. He does not admit to having any physical problems related to the alcohol. He tells me that he would like to stop drinking, however. He denies use of any other drugs. He complains also of chronic pain, which he says is the source of any mood problems that he does have. He has chronic pain in his lower back and both hips, which has been getting worse and he is not getting any treatment for it.   PAST PSYCHIATRIC HISTORY: He has had multiple visits to the Emergency Room for alcohol abuse without admission. He was last admitted here in 2012 under somewhat similar circumstances. At that time  he was discharged after a brief detox to outpatient treatment in the community. It does not appear that he has ever followed up. He has never been treated for any other kind of mental health problems. Denies any suicide attempts. Denies any history of psychosis. He has never been to any inpatient rehab programs. No history of DTs or seizures with drinking.   PAST MEDICAL HISTORY: Chronic back pain and hip pain diagnosed as spinal stenosis and arthritis. It has been present since he was in his 62s, but has been getting worse. He only takes occasional Aleve for it, does not use any other pain medicine. Has not consulted his primary care doctor in years. No other known ongoing medical problems.   SOCIAL HISTORY: Lives with his wife. The patient is retired. Mostly stays around the house doing not much most of the time. Says that he would like to increase his activity level and feels that his pain limits him.   FAMILY HISTORY: None identified.   CURRENT MEDICATIONS: None.   ALLERGIES: No known drug allergies.   REVIEW OF SYSTEMS: Complains of chronic low back pain and bilateral hip pain. Denies feeling depressed. Currently denies suicidal ideation. Denies psychotic symptoms. The rest of the full review of systems is negative.   MENTAL STATUS EXAMINATION: Neatly dressed and groomed man who looks his stated age or older. Cooperative with the interview. Good eye contact. Psychomotor activity normal.  Speech decreased in total amount but normal tone, easy to understand. Affect a little bit constricted but not clearly depressed. Mood stated as being okay. Thoughts appear lucid. He seems to be somewhat guarded and careful in answering questions. Does not, however, seem obviously, paranoid. No obvious delusions or loosening of associations. Denies hallucinations. Denies suicidal or homicidal ideation. Judgment and insight now improved, but he is not intoxicated, Alert and oriented x 4. Short-and long-term memory  intact. Normal fund of knowledge.   PHYSICAL EXAMINATION: GENERAL: The patient does not appear to be in any acute distress. He walks a little bit gingerly like he has back pain, but it is not an obvious limp. Has decreased range of motion at his hips, otherwise normal range of motion throughout. Strength and reflexes normal and symmetric throughout. He has a superficial scratch across his left wrist where he scratched himself. No other skin lesions.  HEENT: Pupils equal and reactive. Face symmetric. Oral mucosa dry.  NECK: Full range of motion: Nontender.  NEUROLOGIC: Cranial nerves symmetric and normal.  LUNGS: Clear with no wheezes.  HEART: Regular rate and rhythm.   ABDOMEN: Soft, nontender, normal bowel sounds.  VITAL SIGNS: Currently, temperature 98.5, pulse 84, respirations 18, blood pressure 110/78.   LABORATORY RESULTS: Admission labs included a drug screen that was negative. Urinalysis is slight blood, otherwise unremarkable. CBC normal. Chemistry slightly low, calcium 8.3, slightly elevated AST 53, otherwise unremarkable. Initial alcohol level 392.   ASSESSMENT: A 55 year old man with alcohol dependence. No history of seizures or DTs. Currently not having significant symptoms of alcohol withdrawal. He wants to be discharged. His insight is intellectually present, but he has not shown any history of motivation to really engage in stopping drinking. He is more concerned about his chronic back pain, but has been resistant to getting any treatment for that as well. Cut himself yesterday. Despite several attempts on my part I could not get him to investigate why that happened yesterday of all days or whether there was any real change in his mood. The patient required hospitalization for stabilization and safety.   TREATMENT PLAN: Spent some time trying to do motivational interviewing about drinking. Also, about trying to discover whether his mood has been worse. He said that he wants to stop  drinking now and thinks that he will do that, but he will not commit to any specific form of treatment. I recommended the [facility] program as he is a Geologist, engineering, but he seemed uninterested. He is currently on detox protocol which can be continued. As far as his pain, I suggested that we try some nonnarcotic treatment and I will start 30 mg of duloxetine a day and 300 mg of gabapentin 3 times a day, which may be an initial form of treatment. He can continue getting the Restoril p.r.n. at night for sleep. Continue education and try to engage him in groups. May consider discharge by tomorrow with outpatient follow-up.   DIAGNOSIS, PRINCIPAL AND PRIMARY:  AXIS I: Substance-induced mood disorder, depressed.   SECONDARY DIAGNOSES: AXIS I: Alcohol dependence.  AXIS II: Deferred.  AXIS III: Chronic pain.  AXIS IV: Moderate to severe from chronic pain.  AXIS V: Functioning at time of evaluation: 30.   ____________________________ Gonzella Lex, MD jtc:sg D: 11/20/2013 13:26:44 ET T: 11/20/2013 13:59:32 ET JOB#: [id number]  cc: Gonzella Lex, MD, <Dictator> Gonzella Lex MD ELECTRONICALLY SIGNED 12/05/2013 11:36

## 2014-10-25 NOTE — Discharge Summary (Signed)
PATIENT NAME:  Jesse Obrien, Jesse Obrien MR#:  [id number] DATE OF BIRTH:  [birth year]  DATE OF ADMISSION:  12/20/2013 DATE OF DISCHARGE:  12/30/2013  ADDENDUM: Continuation of dictation.   The patient remained a FULL code.   TIME SPENT: 40 minutes.   ____________________________ Hart Rochester Posey Pronto, MD sap:sb D: 12/30/2013 14:00:57 ET T: 12/30/2013 14:44:28 ET JOB#: [id number]  cc: Sona A. Posey Pronto, MD, <Dictator> Ilda Basset MD ELECTRONICALLY SIGNED 01/04/2014 10:34

## 2014-10-25 NOTE — Discharge Summary (Signed)
PATIENT NAME:  EDILSON, VITAL MR#:  [id number] DATE OF BIRTH:  10/30/1960  DATE OF ADMISSION:  12/20/2013 DATE OF DISCHARGE:  12/30/2013  ADDENDUM: To interim discharge summary dictated by Dr. Verdell Carmine on 26th June. This covers hospital stay from 27th to 29th.   The patient appeared hemodynamically stable. He was extubated 12/29/2013 and has been doing well as far as his pulmonary status. He was seen by Dr. Bary Leriche from behavioral medicine who recommends inpatient behavior medicine treatment for his ongoing psych issues. The patient is from [facility]. Unless [facility] has diversion for beds in inpatient psychiatry, the patient will not be able to go downstairs to behavior medicine. Care management is working on discharge plan. Physical therapy has been initiated. Further management for the patient's mood disorder by psychiatry.  CURRENT MEDICATIONS:  1.  Docusate 100 mg daily b.i.d.  2.  Tylenol 650 q. 4 p.r.n.  3.  Alprazolam 1 mg q. 8 hourly.  4.  Cymbalta 30 mg daily.  5.  Gabapentin 300 mg t.i.d.  6.  Tramadol 50 mg q. 6 p.r.n.   ____________________________ Gus Height A. Posey Pronto, MD sap:sb D: 12/30/2013 13:59:37 ET T: 12/30/2013 14:41:24 ET JOB#: [id number]  cc: Thorn Demas A. Posey Pronto, MD, <Dictator> Ilda Basset MD ELECTRONICALLY SIGNED 01/04/2014 10:34

## 2014-10-29 NOTE — H&P (Signed)
PATIENT NAME:  VA, BROADWELL MR#:  [id number] DATE OF BIRTH:  [birth year]  DATE OF ADMISSION:  06/05/2014  REFERRING PHYSICIAN: Emergency Room M.D.   ATTENDING PHYSICIAN: Orson Slick, MD    IDENTIFYING DATA: Mr. Aikman is a 54 year old male with history of alcoholism and depression.   CHIEF COMPLAINT: "I want to stop."   HISTORY OF PRESENT ILLNESS:   Mr. Dunavan has a long history of alcoholism with multiple admissions for detoxification and rehab.  He was hospitalized at [facility] several times.  At his last hospitalization in June, he was transferred to the [place] substance abuse treatment program.  He completed it, but is very vague about his ability to maintain sobriety.  He has not been taking any medicines for depression, anxiety or chronic pain at home prior to admission.  He reportedly called 911 complaining of suicidal ideation and putting a knife to his throat.  He later took it back, and now denies any thoughts of hurting himself or others.  He is on probation for abusing 911 calls.  He reports that for the past week or so he has been drinking a 12 pack a day.  He believes that he is an episodic drinker.  He wants to break the cycle.  He denies symptoms of depression, but endorses heightened anxiety. There are no psychotic symptoms. No symptoms suggestive of bipolar mania.  He denies other than alcohol substance use.   PAST PSYCHIATRIC HISTORY: As above, multiple psychiatric hospitalizations for alcohol detoxification but also for depression and suicide attempt by overdose.  He has been in alcohol rehab as well.   FAMILY PSYCHIATRIC HISTORY: Multiple family members with alcoholism.   PAST MEDICAL HISTORY: Chronic back pain for which he is not receiving any treatment.   ALLERGIES: No known drug allergies.   MEDICATIONS ON ADMISSION: None.   SOCIAL HISTORY: He is a retired Scientist, research (life sciences), does not take advantage of the [employer] health care.  He lives  with his wife.  The wife, in the past, has been quite exasperated with his drinking, but I did not talk to her this time around.   He receives a nice retirement check but mostly stays home and drinks.   REVIEW OF SYSTEMS:  CONSTITUTIONAL: No fevers or chills. No weight changes.  EYES: No double or blurred vision.  ENT: No hearing loss.  RESPIRATORY: No shortness of breath or cough.  CARDIOVASCULAR: No chest pain or orthopnea.  GASTROINTESTINAL: No abdominal pain, nausea, vomiting, or diarrhea.  GENITOURINARY: No incontinence or frequency.  ENDOCRINE: No heat or cold intolerance.  LYMPHATIC: No anemia or easy bruising.  INTEGUMENTARY: No acne or rash.  MUSCULOSKELETAL: No muscle or joint pain.  NEUROLOGIC: No tingling or weakness.  PSYCHIATRIC: See history of present illness for details.   PHYSICAL EXAMINATION:  VITAL SIGNS: Blood pressure 106/78, pulse 68, respirations 18, temperature 97.7.  GENERAL: This is a well-developed, middle-aged male in no acute distress.  HEENT: The pupils are equal, round, and reactive to light. Sclerae are anicteric.  NECK: Supple. No thyromegaly.  LUNGS: Clear to auscultation. No dullness to percussion.  HEART: Regular rhythm and rate. No murmurs, rubs, or gallops.  ABDOMEN: Soft, nontender, nondistended. Positive bowel sounds.  MUSCULOSKELETAL: Normal muscle strength in all extremities.  SKIN: No rashes or bruises.  LYMPHATIC: No cervical adenopathy.  NEUROLOGIC: Cranial nerves II through XII are intact.   LABORATORY DATA: Chemistries are within normal limits. Blood glucose 135. Blood alcohol level on admission  270.  LFTs within normal limits except for AST of 42, urine tox screen is negative for substances. CBC within normal limits. Urinalysis is not suggestive of urinary tract infection. Serum acetaminophen less than 2. Serum salicylates 41.6.   MENTAL STATUS EXAMINATION ON ADMISSION: The patient is alert and oriented to person, place, time and  situation. He is pleasant, polite and cooperative. He is well groomed and casually dressed. He maintains good eye contact. His speech is of normal rate and rhythm and volume. Mood is fine with flat affect. Thought process is logical and goal oriented. Thought content: He denies thoughts of hurting himself or others. There are no delusions or paranoia. There are no auditory or visual hallucinations. His cognition is grossly intact. Registration, recall, short and long-term memory are intact. He is of normal intelligence and fund of knowledge.  His insight and judgment are questionable.   SUICIDE RISK ASSESSMENT ON ADMISSION: This is a patient with a long history of alcoholism and multiple suicide attempts in the past who came to the hospital for another alcohol detoxification.   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  AXIS II: Deferred.  AXIS III: Back pain.   PLAN: The patient was admitted to [facility] unit for safety, stabilization and medication management, and alcohol detox. 1.   Suicidal ideation.  The patient denies having a knife to his throat and denies any thoughts of hurting himself or others. 2.   Alcohol detoxification: He is on the [facility] protocol. 3.  Mood.  He reports that Cymbalta was helpful for anxiety in the past.  We will restart that.  4.  Insomnia:  He, in the past, responded well to trazodone.  We will offer 100 mg at night.  5.  Back pain.  The patient has been off his pain medications.  He does not have a provider. Says that tramadol does not help much.  He really made no effort to establish care with a pain clinic. We will not offer any narcotics or tramadol.   DISPOSITION: He will be discharged to home.  He already spoke with Sherrian Divers about [facility] meetings and has information already.       ____________________________ Wardell Honour Bary Leriche, MD jbp:DT D: 06/06/2014  11:28:50 ET T: 06/06/2014 12:03:31 ET JOB#: [id number]  cc: Reatha Sur B. Bary Leriche, MD, <Dictator> Clovis Fredrickson MD ELECTRONICALLY SIGNED 07/07/2014 19:27

## 2014-11-02 NOTE — Consult Note (Signed)
Brief Consult Note: Comments: Psychiatry: Patient seen and chart reviewed. PAtient no longer acutelly intoxicated. Has no current complaint. Denies any SI at all and denies any HI, hallucinations. Denies hx of seizures or DTS. Affect calm and patient is lucid and alert and oriented and has adequte judgemet and insight.   No longer meets commitment criteria. Does not require hospital treatment. Patient requests discharge. Counceling done. Will dc the IVC and discuss with ER MD to suggegst discharge.  Electronic Signatures: Gonzella Lex (MD)  (Signed 11-Jan-16 15:48)  Authored: Brief Consult Note   Last Updated: 11-Jan-16 15:48 by Gonzella Lex (MD)

## 2014-11-02 NOTE — Consult Note (Signed)
PATIENT NAME:  Jesse Obrien, Jesse Obrien MR#:  [id number] DATE OF BIRTH:  [birth year]  DATE OF CONSULTATION:  07/14/2014  REFERRING PHYSICIAN:  Marjean Donna, MD CONSULTING PHYSICIAN:  Cordelia Pen. Gretel Acre, MD  REASON FOR CONSULTATION: "I called the police to bring me up here so I can get help. I drink too much."   HISTORY OF PRESENT ILLNESS: The patient is a 54 year old male with long history of alcohol use and depression who was brought by the police seeking help with detox. He called 911 from his home requesting help. According to the records, the patient called 911 in order to get help from his alcohol use and was holding a knife to his throat upon arrival of the police. He was intoxicated at this time. He reported that he has been drinking approximately a 12 pack on a daily basis and the amount he has been drinking for years. The patient reported that he is currently experiencing withdrawals in the past. The patient also is exhibiting anxiety, nervousness and dizziness. He does not have any history of withdrawal seizures or blackouts. The patient is currently on probation for misuse of 911 calls. He currently denied having any upcoming court dates or pending charges.   During my interview, the patient was lying in the bed and he was very agitated and does not want to talk. He reported that he wants to go to the jail and wanted me to leave his room. He reported that he wants to talk to his probation officer. According to the staff the probation officer came to visit him earlier this morning, but also did not communicate with them as well. He appeared agitated and does not exhibit any withdrawal symptoms at this time. It is not known if the patient is exhibiting any suicidal ideations or plans at this time.   PAST PSYCHIATRIC HISTORY: The patient has history of multiple prior suicide admissions to a hospital under similar circumstances in which he has been alcohol and is coming for alcohol intoxication and depressed  mood. He has history of suicide attempts by overdose and self-mutilation in the past. He was also transferred to the [facility] in the past. He has history of multiple Emergency Room visits. The patient is noncompliant with his psychotropic medication at this time.   SOCIAL HISTORY: The patient is a retired Scientist, research (life sciences). He currently lives with his wife and has limited social activity.  PAST MEDICAL HISTORY: Chronic back pain. He also has history of some chronic brain tumor. He does not have any history of withdrawal seizures.   FAMILY HISTORY: Alcohol abuse.   CURRENT MEDICATIONS: None.  ALLERGIES: No known drug allergies.   REVIEW OF SYSTEMS: The patient did not participate in the review of systems at this time.   MENTAL STATUS EXAMINATION: The patient is a moderately built male who appears disheveled and was lying in the bed. He did not participate in the mental status exam. He appears somewhat agitated with poor psychomotor activity. Speech was agitated and mood was anxious and depressed. Affect was congruent. Denied auditory or visual hallucinations. Fund of knowledge was poor. Intelligence appeared normal. Has poor insight and judgment about his current situation.  VITAL SIGNS: Temperature 98.9, pulse 109, respirations 16, blood pressure 117/79.  LABORATORY DATA: Glucose 109, BUN 7, creatinine 0.84, sodium 142, potassium 3.7, chloride 109, bicarbonate 22. Anion gap 11. Osmolality 282. Calcium 8.2. Blood alcohol level 299.  bilirubin 0.2, alkaline phosphatase 92, AST 46, ALT 58. Urine drug screen is negative.  WBC 8.9, RBC 4.81, hemoglobin 15.9, hematocrit 47.2, platelet count 212,000, MCV 98, RDW 13.  DIAGNOSTIC IMPRESSION: AXIS I: 1.  Alcohol abuse, severe.  2.  Major depressive disorder, recurrent, severe.  AXIS II: None.  AXIS III: History of benign brain tumor by history, alcohol intoxication.  TREATMENT PLAN: 1.  The patient will be started on Librium 25 mg p.o. q. 6 hours.   2.  Thiamine 100 mg p.o. b.i.d.  3.  He will be monitored in the [facility]HU at this time and will obtain collateral information about his legal history. 4.  Once he becomes stable, we will decide about inpatient admission versus outpatient treatment and will refer him to the outpatient rehabilitation program.   Thank you for allowing me to participate in the care of this patient.   ____________________________ Cordelia Pen. Gretel Acre, MD usf:sb D: 07/14/2014 11:08:25 ET T: 07/14/2014 11:29:38 ET JOB#: [id number]  cc: Cordelia Pen. Gretel Acre, MD, <Dictator> Jeronimo Norma MD ELECTRONICALLY SIGNED 07/14/2014 12:38

## 2014-11-11 NOTE — H&P (Signed)
PATIENT NAME:  Jesse Obrien, Jesse Obrien [id number] OF BIRTH:  [birth year] OF ADMISSION:  12/31/2013 PHYSICIAN:  Fritzi Mandes, MD PHYSICIAN:  Torina Ey B. Hakeem Frazzini, MD DATA: Jesse Obrien is a 54 year old male with a history of depression, mood instability and alcoholism.  COMPLAINT: "I am fine." OF PRESENT ILLNESS: Jesse Obrien was admitted to CCU after severe overdose on Ambien CR while drunk on 12/20/2013. He developed aspiration pneumonia and was not transferred to Behavioral medicine unit until 12/31/2013 for further treatment of depression and alcoholism. He has a lifelong history of drinking and is known to display violent behavior and personality changes when he drinks leading to marital problems. His wife actually sleeps in the car several times a week while Jesse Obrien is drinking. He started at the age of 34 and consumes a case of beer daily. The patient believes that she drinks not because he is an alcoholic but because of severe back pain from arthritis. This was diagnosed at [facility] but never treated. The patient denies other than alcohol substance use. He denies symptoms of anxiety, psychosis or symptoms suggestive of bipolar mania. He endorses symptoms of depression with poor sleep and appetite, low energy and concentration, feeling of hopelessness, helplessness and guilt, social isolation and anhedonia. He has passing suicidal thoughts while drunk. This culminated in a recent suicide attempt. He was hospitalized briefly in 5/19-5/21/2015 for alcohol detox but left the hospital prematurely and refused any treatment in inpatient or outpatient setting. He now wants treatment, giving the fact that his wife is about to leave him, but only in the [place] system PSYCHIATRIC HISTORY: He was hospitalized at [facility] in 2007, 2012, 2015 for the same. He followed up with IOP and AA programs. He did not maintain any sobriety. There is one recent suicide attempt.  PSYCHIATRIC HISTORY: Both parents with alcoholism.   MEDICAL  HISTORY:  1. Hypertension.  2. Benign brain tumor.  Back pain.  AT THE TIME OF TRANSFER:  1.  Docusate 100 mg daily b.i.d.  Tylenol 650 q. 4 p.r.n.  Alprazolam 1 mg q. 8 hourly.  Cymbalta 30 mg daily.  Gabapentin 300 mg t.i.d.  Tramadol 50 mg q. 6 p.r.n.   ALLERGIES: No known drug allergies.   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 He has been married 3 times. He lives with his wife. He has two adult children.  OF SYSTEMS: CONSTITUTIONAL: No fevers or chills. No weight changes. EYES: No double or blurred vision. ENT: No hearing loss. RESPIRATORY: No shortness of breath or cough. CARDIOVASCULAR: No chest pain or orthopnea. GASTROINTESTINAL: No abdominal pain, nausea, vomiting, or diarrhea. GU: No incontinence or frequency. ENDOCRINE: No heat or cold intolerance. LYMPHATIC: No anemia or easy bruising. INTEGUMENTARY: No acne or rash. MUSCULOSKELETAL: Positive of back pain. NEUROLOGIC: History of benign brain tumor. No tingling or weakness. PSYCHIATRIC: See history of present illness for details.  EXAMINATION: VITAL SIGNS: Blood pressure 125/74, pulse 58, respirations 18, temperature 98.7. GENERAL: This is a well developed male in no acute distress. HEENT: The pupils are equal, round, and reactive to light. NECK: Supple. No thyromegaly. LUNGS: Clear to auscultation. HEART: Regular rhythm and rate. ABDOMEN: Soft, nontender, and nondistended. MUSCULOSKELETAL: Normal muscle strength in all extremities. SKIN: No rashes or bruises. LYMPHATIC: No cervical adenopathy. NEUROLOGIC: Cranial nerves II through XII are intact. He moves with difficulty.  DIAGNOSTIC, AND RADIOLOGICAL DATA: Chemistries and LFTs  within normal limits except potassium 3.3 and magnesium 2.5. Blood alcohol level 0.277. Urine tox screen negative for substances. CBC within normal limits. Urinalysis is not suggestive of urinary tract  infection.  STATUS EXAMINATION ON ADMISSION: The patient is alert and oriented to person, place, and somewhat to situation. He does not remember any detail of his admission. I do not believe he remembers the overdose. He is irritable and short. He is marginally groomed, wearing hospital gown. There is some eye contact. Speech is loud. Mood is "sick" with labile affect. Thought processing is slow and illogical at times. He denies suicidal or homicidal ideation. There is no evidence of delusions or hallucinations. His cognition is somewhat impaired and the patient did not want to participate in cognitive part of the exam. His insight and judgment are poor.   RISK ASSESSMENT ON ADMISSION: This is a patient with a long history of alcoholism, mood instability and suicide attempt while drunk.   I:  Major depressive disorder recurrent severe.   Alcohol dependence.II:  Cluster B personality disorder.  III:  Hypertension, Benign brain tumor, back pain. IV:  Mental illness, substance abuse, chronic pain, marital.  V:  GAF 25.  The patient was admitted to [place] [facility] for safety, stabilization and medication management. He was initially placed on suicide precautions and was closely monitored for any unsafe behaviors. He underwent full psychiatric and risk assessment. At no time during the current hospitalization was the patient suicidal, homicidal, or violent. He actively participated in his treatment. He received pharmacotherapy, individual and group psychotherapy, substance abuse counseling, and support from therapeutic milieu.     1. Suicidality: The patient was able to contract for safety.   2. Alcohol detox: The patient completed detox in CCU. Vital signs are stable.  Mood: The patient was treated with Cymbalta in the past but has never been compliant. Will start Remeron to aid depression and sleep as we no longer will prescribe Ambien or benzodiazepines.  Substance  abuse treatment: The patient is interested in treatment at the [place] system but not here.    Chronic pain: He reportedly had some studies done the spine clinic in [place]. Results of his MRI was send to his primary provider where he expects to get pain medications.    Disposition:  The patient is on IVC and could be transferred to [facility] system if bed available or to [facility] rehab facility in [place].     Electronic Signatures: Orson Slick (MD)  (Signed on 01-Jul-15 13:05)  Authored  Last Updated: 01-Jul-15 13:05 by Orson Slick (MD)

## 2014-11-11 NOTE — H&P (Signed)
PATIENT NAME:  KARSIN, PESTA [id number] OF BIRTH:  09/11/1960 OF ADMISSION:  06/30/2015OF ASSESSMENT: 01/01/2014 PHYSICIAN:  Fritzi Mandes, MD PHYSICIAN:  Soham Hollett B. Avyay Coger, MD DATA: Mr. Cotto is a 54 year old male with a history of depression, mood instability and alcoholism.  COMPLAINT: "I am fine." OF PRESENT ILLNESS: Mr. Rossa was admitted to CCU after severe overdose on Ambien CR while drunk on 12/20/2013. He developed aspiration pneumonia and was not transferred to Behavioral medicine unit until 12/31/2013 for further treatment of depression and alcoholism. He has a lifelong history of drinking and is known to display violent behavior and personality changes when he drinks leading to marital problems. His wife actually sleeps in the car several times a week while Mr. Sankey is drinking. He started at the age of 45 and consumes a case of beer daily. The patient believes that she drinks not because he is an alcoholic but because of severe back pain from arthritis. This was diagnosed at [facility] but never treated. The patient denies other than alcohol substance use. He denies symptoms of anxiety, psychosis or symptoms suggestive of bipolar mania. He endorses symptoms of depression with poor sleep and appetite, low energy and concentration, feeling of hopelessness, helplessness and guilt, social isolation and anhedonia. He has passing suicidal thoughts while drunk. This culminated in a recent suicide attempt. He was hospitalized briefly in 5/19-5/21/2015 for alcohol detox but left the hospital prematurely and refused any treatment in inpatient or outpatient setting. He now wants treatment, giving the fact that his wife is about to leave him, but only in the [place] system PSYCHIATRIC HISTORY: He was hospitalized at [facility] in 2007, 2012, 2015 for the same. He followed up with IOP and AA programs. He did not maintain any sobriety. There is one recent suicide attempt.  PSYCHIATRIC HISTORY: Both parents  with alcoholism.   MEDICAL HISTORY:  1. Hypertension.  2. Benign brain tumor.  Back pain.  AT THE TIME OF TRANSFER:  Docusate 100 mg daily b.i.d.  Tylenol 650 q. 4 p.r.n.  Alprazolam 1 mg q. 8 hourly.  Cymbalta 30 mg daily.  Gabapentin 300 mg t.i.d.  Tramadol 50 mg q. 6 p.r.n.   ALLERGIES: No known drug allergies.   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 He has been married 3 times. He lives with his wife. He has two adult children.  OF SYSTEMS: CONSTITUTIONAL: No fevers or chills. No weight changes. EYES: No double or blurred vision. ENT: No hearing loss. RESPIRATORY: No shortness of breath or cough. CARDIOVASCULAR: No chest pain or orthopnea. GASTROINTESTINAL: No abdominal pain, nausea, vomiting, or diarrhea. GU: No incontinence or frequency. ENDOCRINE: No heat or cold intolerance. LYMPHATIC: No anemia or easy bruising. INTEGUMENTARY: No acne or rash. MUSCULOSKELETAL: Positive of back pain. NEUROLOGIC: History of benign brain tumor. No tingling or weakness. PSYCHIATRIC: See history of present illness for details.  EXAMINATION: VITAL SIGNS: Blood pressure 125/74, pulse 58, respirations 18, temperature 98.7. GENERAL: This is a well developed male in no acute distress. HEENT: The pupils are equal, round, and reactive to light. NECK: Supple. No thyromegaly. LUNGS: Clear to auscultation. HEART: Regular rhythm and rate. ABDOMEN: Soft, nontender, and nondistended. MUSCULOSKELETAL: Normal muscle strength in all extremities. SKIN: No rashes or bruises. LYMPHATIC: No cervical adenopathy. NEUROLOGIC: Cranial nerves II through XII are intact. He moves with difficulty.  DIAGNOSTIC, AND RADIOLOGICAL DATA: Chemistries and LFTs  within normal limits except potassium 3.3 and magnesium 2.5. Blood alcohol level 0.277. Urine tox screen negative for substances. CBC within normal limits. Urinalysis is not suggestive  of urinary tract infection.  STATUS EXAMINATION ON ADMISSION: The patient is alert and oriented to person, place, and somewhat to situation. He does not remember any detail of his admission. I do not believe he remembers the overdose. He is irritable and short. He is marginally groomed, wearing hospital gown. There is some eye contact. Speech is loud. Mood is "sick" with labile affect. Thought processing is slow and illogical at times. He denies suicidal or homicidal ideation. There is no evidence of delusions or hallucinations. His cognition is somewhat impaired and the patient did not want to participate in cognitive part of the exam. His insight and judgment are poor.   RISK ASSESSMENT ON ADMISSION: This is a patient with a long history of alcoholism, mood instability and suicide attempt while drunk.   I:  Major depressive disorder recurrent severe.   Alcohol dependence.II:  Cluster B personality disorder.  III:  Hypertension, Benign brain tumor, back pain. IV:  Mental illness, substance abuse, chronic pain, marital.  V:  GAF 25.  The patient was admitted to [place] [facility] for safety, stabilization and medication management. He was initially placed on suicide precautions and was closely monitored for any unsafe behaviors. He underwent full psychiatric and risk assessment. At no time during the current hospitalization was the patient suicidal, homicidal, or violent. He actively participated in his treatment. He received pharmacotherapy, individual and group psychotherapy, substance abuse counseling, and support from therapeutic milieu.     1. Suicidality: The patient was able to contract for safety.   2. Alcohol detox: The patient completed detox in CCU. Vital signs are stable.  Mood: The patient was treated with Cymbalta in the past but has never been compliant. Will start Remeron to aid depression and sleep as we no longer will prescribe Ambien or  benzodiazepines.  Substance abuse treatment: The patient is interested in treatment at the [place] system but not here.    Chronic pain: He reportedly had some studies done the spine clinic in [place]. Results of his MRI was send to his primary provider where he expects to get pain medications.    Disposition:  The patient is on IVC and could be transferred to [facility] system if bed available or to [facility] rehab facility in [place].     Electronic Signatures: Orson Slick (MD) (Signed on 01-Jul-15 14:55)  Authored   Last Updated: 01-Jul-15 14:56 by Orson Slick (MD)

## 2014-11-11 NOTE — H&P (Signed)
PATIENT NAME:  ALFERD, OBRYANT [id number] OF BIRTH:  Dec 31, 1960 OF ADMISSION:  12/31/2013 PHYSICIAN:  Fritzi Mandes, MD PHYSICIAN:  Robinette Esters B. Charon Akamine, MD DATA: Mr. Thibault is a 54 year old male with a history of depression, mood instability and alcoholism.  COMPLAINT: "I am fine." OF PRESENT ILLNESS: Mr. Labreck was admitted to CCU after severe overdose on Ambien CR while drunk on 12/20/2013. He developed aspiration pneumonia and was not transferred to Behavioral medicine unit until 12/31/2013 for further treatment of depression and alcoholism. He has a lifelong history of drinking and is known to display violent behavior and personality changes when he drinks leading to marital problems. His wife actually sleeps in the car several times a week while Mr. Torbeck is drinking. He started at the age of 44 and consumes a case of beer daily. The patient believes that she drinks not because he is an alcoholic but because of severe back pain from arthritis. This was diagnosed at [facility] but never treated. The patient denies other than alcohol substance use. He denies symptoms of anxiety, psychosis or symptoms suggestive of bipolar mania. He endorses symptoms of depression with poor sleep and appetite, low energy and concentration, feeling of hopelessness, helplessness and guilt, social isolation and anhedonia. He has passing suicidal thoughts while drunk. This culminated in a recent suicide attempt. He was hospitalized briefly in 5/19-5/21/2015 for alcohol detox but left the hospital prematurely and refused any treatment in inpatient or outpatient setting. He now wants treatment, giving the fact that his wife is about to leave him, but only in the [place] system PSYCHIATRIC HISTORY: He was hospitalized at [facility] in 2007, 2012, 2015 for the same. He followed up with IOP and AA programs. He did not maintain any sobriety. There is one recent suicide attempt.  PSYCHIATRIC HISTORY: Both parents with alcoholism.   MEDICAL  HISTORY:  1. Hypertension.  2. Benign brain tumor.  Back pain.  AT THE TIME OF TRANSFER:  1.  Docusate 100 mg daily b.i.d.  Tylenol 650 q. 4 p.r.n.  Alprazolam 1 mg q. 8 hourly.  Cymbalta 30 mg daily.  Gabapentin 300 mg t.i.d.  Tramadol 50 mg q. 6 p.r.n.   ALLERGIES: No known drug allergies.   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 He has been married 3 times. He lives with his wife. He has two adult children.  OF SYSTEMS: CONSTITUTIONAL: No fevers or chills. No weight changes. EYES: No double or blurred vision. ENT: No hearing loss. RESPIRATORY: No shortness of breath or cough. CARDIOVASCULAR: No chest pain or orthopnea. GASTROINTESTINAL: No abdominal pain, nausea, vomiting, or diarrhea. GU: No incontinence or frequency. ENDOCRINE: No heat or cold intolerance. LYMPHATIC: No anemia or easy bruising. INTEGUMENTARY: No acne or rash. MUSCULOSKELETAL: Positive of back pain. NEUROLOGIC: History of benign brain tumor. No tingling or weakness. PSYCHIATRIC: See history of present illness for details.  EXAMINATION: VITAL SIGNS: Blood pressure 125/74, pulse 58, respirations 18, temperature 98.7. GENERAL: This is a well developed male in no acute distress. HEENT: The pupils are equal, round, and reactive to light. NECK: Supple. No thyromegaly. LUNGS: Clear to auscultation. HEART: Regular rhythm and rate. ABDOMEN: Soft, nontender, and nondistended. MUSCULOSKELETAL: Normal muscle strength in all extremities. SKIN: No rashes or bruises. LYMPHATIC: No cervical adenopathy. NEUROLOGIC: Cranial nerves II through XII are intact. He moves with difficulty.  DIAGNOSTIC, AND RADIOLOGICAL DATA: Chemistries and LFTs  within normal limits except potassium 3.3 and magnesium 2.5. Blood alcohol level 0.277. Urine tox screen negative for substances. CBC within normal limits. Urinalysis is not suggestive of urinary tract  infection.  STATUS EXAMINATION ON ADMISSION: The patient is alert and oriented to person, place, and somewhat to situation. He does not remember any detail of his admission. I do not believe he remembers the overdose. He is irritable and short. He is marginally groomed, wearing hospital gown. There is some eye contact. Speech is loud. Mood is "sick" with labile affect. Thought processing is slow and illogical at times. He denies suicidal or homicidal ideation. There is no evidence of delusions or hallucinations. His cognition is somewhat impaired and the patient did not want to participate in cognitive part of the exam. His insight and judgment are poor.   RISK ASSESSMENT ON ADMISSION: This is a patient with a long history of alcoholism, mood instability and suicide attempt while drunk.   I:  Major depressive disorder recurrent severe.   Alcohol dependence.II:  Cluster B personality disorder.  III:  Hypertension, Benign brain tumor, back pain. IV:  Mental illness, substance abuse, chronic pain, marital.  V:  GAF 25.  The patient was admitted to [place] [facility] for safety, stabilization and medication management. He was initially placed on suicide precautions and was closely monitored for any unsafe behaviors. He underwent full psychiatric and risk assessment. At no time during the current hospitalization was the patient suicidal, homicidal, or violent. He actively participated in his treatment. He received pharmacotherapy, individual and group psychotherapy, substance abuse counseling, and support from therapeutic milieu.     1. Suicidality: The patient was able to contract for safety.   2. Alcohol detox: The patient completed detox in CCU. Vital signs are stable.  Mood: The patient was treated with Cymbalta in the past but has never been compliant. Will start Remeron to aid depression and sleep as we no longer will prescribe Ambien or benzodiazepines.  Substance  abuse treatment: The patient is interested in treatment at the [place] system but not here.    Chronic pain: He reportedly had some studies done the spine clinic in [place]. Results of his MRI was send to his primary provider where he expects to get pain medications.    Disposition:  The patient is on IVC and could be transferred to [facility] system if bed available or to [facility] rehab facility in [place].     Electronic Signatures: Orson Slick (MD)  (Signed on 01-Jul-15 13:09)  Authored  Last Updated: 01-Jul-15 13:09 by Orson Slick (MD)

## 2014-11-20 ENCOUNTER — Emergency Department
Admission: EM | Admit: 2014-11-20 | Discharge: 2014-11-20 | Disposition: A | Discharge status: Home / Self Care | Attending: Emergency Medicine | Admitting: Emergency Medicine

## 2014-11-20 ENCOUNTER — Encounter: Payer: Self-pay | Admitting: Emergency Medicine

## 2014-11-20 DIAGNOSIS — F1029 Alcohol dependence with unspecified alcohol-induced disorder: Secondary | ICD-10-CM | POA: Insufficient documentation

## 2014-11-20 DIAGNOSIS — Z720 Tobacco use: Secondary | ICD-10-CM | POA: Diagnosis not present

## 2014-11-20 DIAGNOSIS — F1010 Alcohol abuse, uncomplicated: Secondary | ICD-10-CM | POA: Diagnosis present

## 2014-11-20 LAB — CBC
HCT: 46.6 % (ref 40.0–52.0)
HEMOGLOBIN: 15.6 g/dL (ref 13.0–18.0)
MCH: 32.3 pg (ref 26.0–34.0)
MCHC: 33.5 g/dL (ref 32.0–36.0)
MCV: 96.4 fL (ref 80.0–100.0)
Platelets: 190 10*3/uL (ref 150–440)
RBC: 4.83 MIL/uL (ref 4.40–5.90)
RDW: 13.5 % (ref 11.5–14.5)
WBC: 7.8 10*3/uL (ref 3.8–10.6)

## 2014-11-20 LAB — URINE DRUG SCREEN, QUALITATIVE (ARMC ONLY)
Amphetamines, Ur Screen: NOT DETECTED
Barbiturates, Ur Screen: NOT DETECTED
Benzodiazepine, Ur Scrn: NOT DETECTED
CANNABINOID 50 NG, UR ~~LOC~~: NOT DETECTED
COCAINE METABOLITE, UR ~~LOC~~: NOT DETECTED
MDMA (Ecstasy)Ur Screen: NOT DETECTED
METHADONE SCREEN, URINE: NOT DETECTED
Opiate, Ur Screen: NOT DETECTED
Phencyclidine (PCP) Ur S: NOT DETECTED
Tricyclic, Ur Screen: NOT DETECTED

## 2014-11-20 LAB — ETHANOL: Alcohol, Ethyl (B): 235 mg/dL — ABNORMAL HIGH (ref <5)

## 2014-11-20 LAB — COMPREHENSIVE METABOLIC PANEL
ALK PHOS: 75 U/L (ref 38–126)
ALT: 49 U/L (ref 17–63)
AST: 40 U/L (ref 15–41)
Albumin: 4.4 g/dL (ref 3.5–5.0)
Anion gap: 9 (ref 5–15)
BILIRUBIN TOTAL: 0.4 mg/dL (ref 0.3–1.2)
BUN: 9 mg/dL (ref 6–20)
CO2: 21 mmol/L — ABNORMAL LOW (ref 22–32)
CREATININE: 0.86 mg/dL (ref 0.61–1.24)
Calcium: 8.5 mg/dL — ABNORMAL LOW (ref 8.9–10.3)
Chloride: 108 mmol/L (ref 101–111)
GLUCOSE: 111 mg/dL — AB (ref 65–99)
Potassium: 3.5 mmol/L (ref 3.5–5.1)
SODIUM: 138 mmol/L (ref 135–145)
Total Protein: 7.7 g/dL (ref 6.5–8.1)

## 2014-11-20 NOTE — ED Notes (Signed)
BEHAVIORAL HEALTH ROUNDING Patient sleeping: No. Patient alert and oriented: yes Behavior appropriate: Yes; If no, describe: n/a Nutrition and fluids offered: Yes  Toileting and hygiene offered: Yes  Sitter present: yes Law enforcement present: Yes

## 2014-11-20 NOTE — BHH Counselor (Signed)
TTS assessment is complete.  CSW informed MD and BHU RN that pt is not interested in treatment and refused referral information to community resources.    [place], [place]

## 2014-11-20 NOTE — ED Notes (Signed)
BEHAVIORAL HEALTH ROUNDING Patient sleeping: No. Patient alert and oriented: yes Behavior appropriate: Yes.  ; If no, describe: n/a Nutrition and fluids offered: Yes  Toileting and hygiene offered: Yes  Sitter present: yes Law enforcement present: Yes

## 2014-11-20 NOTE — ED Notes (Signed)
ED BHU [place] Is the patient under IVC or is there intent for IVC: No. Is the patient medically cleared: Yes.   Is there vacancy in the ED BHU: Yes.   Is the population mix appropriate for patient: Yes.   Is the patient awaiting placement in inpatient or outpatient setting: No. Has the patient had a psychiatric consult: No. Survey of unit performed for contraband, proper placement and condition of furniture, tampering with fixtures in bathroom, shower, and each patient room: Yes.  ; Findings: N/A APPEARANCE/BEHAVIOR calm, cooperative and adequate rapport can be established NEURO ASSESSMENT Orientation: time, place and person Hallucinations: No.None noted (Hallucinations) Speech: Normal Gait: normal RESPIRATORY ASSESSMENT Normal expansion.  Clear to auscultation.  No rales, rhonchi, or wheezing., No chest wall tenderness., No kyphosis or scoliosis. CARDIOVASCULAR ASSESSMENT regular rate and rhythm, S1, S2 normal, no murmur, click, rub or gallop GASTROINTESTINAL ASSESSMENT soft, nontender, BS WNL, no r/g EXTREMITIES normal strength, tone, and muscle mass PLAN OF CARE Provide calm/safe environment. Vital signs assessed twice daily. ED BHU Assessment once each 12-hour shift. Collaborate with intake RN daily or as condition indicates. Assure the ED provider has rounded once each shift. Provide and encourage hygiene. Provide redirection as needed. Assess for escalating behavior; address immediately and inform ED provider.  Assess family dynamic and appropriateness for visitation as needed: Yes.  ; If necessary, describe findings: N/A Educate the patient/family about BHU procedures/visitation: Yes.  ; If necessary, describe findings: N/A

## 2014-11-20 NOTE — ED Notes (Signed)
Patient arrives to [facility]. Dressed and wanded per protocol. [place] Orientation given.. Patient aware of unit limitations and rules
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
## 2014-11-20 NOTE — Discharge Instructions (Signed)
Alcohol Use Disorder °Alcohol use disorder is a mental disorder. It is not a one-time incident of heavy drinking. Alcohol use disorder is the excessive and uncontrollable use of alcohol over time that leads to problems with functioning in one or more areas of daily living. People with this disorder risk harming themselves and others when they drink to excess. Alcohol use disorder also can cause other mental disorders, such as mood and anxiety disorders, and serious physical problems. People with alcohol use disorder often misuse other drugs.  °Alcohol use disorder is common and widespread. Some people with this disorder drink alcohol to cope with or escape from negative life events. Others drink to relieve chronic pain or symptoms of mental illness. People with a family history of alcohol use disorder are at higher risk of losing control and using alcohol to excess.  °SYMPTOMS  °Signs and symptoms of alcohol use disorder may include the following:  °· Consumption of alcohol in larger amounts or over a longer period of time than intended. °· Multiple unsuccessful attempts to cut down or control alcohol use.   °· A great deal of time spent obtaining alcohol, using alcohol, or recovering from the effects of alcohol (hangover). °· A strong desire or urge to use alcohol (cravings).   °· Continued use of alcohol despite problems at work, school, or home because of alcohol use.   °· Continued use of alcohol despite problems in relationships because of alcohol use. °· Continued use of alcohol in situations when it is physically hazardous, such as driving a car. °· Continued use of alcohol despite awareness of a physical or psychological problem that is likely related to alcohol use. Physical problems related to alcohol use can involve the brain, heart, liver, stomach, and intestines. Psychological problems related to alcohol use include intoxication, depression, anxiety, psychosis, delirium, and dementia.   °· The need for  increased amounts of alcohol to achieve the same desired effect, or a decreased effect from the consumption of the same amount of alcohol (tolerance). °· Withdrawal symptoms upon reducing or stopping alcohol use, or alcohol use to reduce or avoid withdrawal symptoms. Withdrawal symptoms include: °¨ Racing heart. °¨ Hand tremor. °¨ Difficulty sleeping. °¨ Nausea. °¨ Vomiting. °¨ Hallucinations. °¨ Restlessness. °¨ Seizures. °DIAGNOSIS °Alcohol use disorder is diagnosed through an assessment by your health care provider. Your health care provider may start by asking three or four questions to screen for excessive or problematic alcohol use. To confirm a diagnosis of alcohol use disorder, at least two symptoms must be present within a 12-month period. The severity of alcohol use disorder depends on the number of symptoms: °· Mild--two or three. °· Moderate--four or five. °· Severe--six or more. °Your health care provider may perform a physical exam or use results from lab tests to see if you have physical problems resulting from alcohol use. Your health care provider may refer you to a mental health professional for evaluation. °TREATMENT  °Some people with alcohol use disorder are able to reduce their alcohol use to low-risk levels. Some people with alcohol use disorder need to quit drinking alcohol. When necessary, mental health professionals with specialized training in substance use treatment can help. Your health care provider can help you decide how severe your alcohol use disorder is and what type of treatment you need. The following forms of treatment are available:  °· Detoxification. Detoxification involves the use of prescription medicines to prevent alcohol withdrawal symptoms in the first week after quitting. This is important for people with a history of symptoms   of withdrawal and for heavy drinkers who are likely to have withdrawal symptoms. Alcohol withdrawal can be dangerous and, in severe cases, cause  death. Detoxification is usually provided in a hospital or in-patient substance use treatment facility.  Counseling or talk therapy. Talk therapy is provided by substance use treatment counselors. It addresses the reasons people use alcohol and ways to keep them from drinking again. The goals of talk therapy are to help people with alcohol use disorder find healthy activities and ways to cope with life stress, to identify and avoid triggers for alcohol use, and to handle cravings, which can cause relapse.  Medicines.Different medicines can help treat alcohol use disorder through the following actions:  Decrease alcohol cravings.  Decrease the positive reward response felt from alcohol use.  Produce an uncomfortable physical reaction when alcohol is used (aversion therapy).  Support groups. Support groups are run by people who have quit drinking. They provide emotional support, advice, and guidance. These forms of treatment are often combined. Some people with alcohol use disorder benefit from intensive combination treatment provided by specialized substance use treatment centers. Both inpatient and outpatient treatment programs are available. Document Released: 07/28/2004 Document Revised: 11/04/2013 Document Reviewed: 09/27/2012 [facility] Patient Information 2015 [place], [place]. This information is not intended to replace advice given to you by your health care provider. Make sure you discuss any questions you have with your health care provider.   FOLLOW UP WITH [facility]--INSTRUCTIONS ON OTHER SHEET OF PAPER.

## 2014-11-20 NOTE — ED Notes (Signed)
States needs help with alcohol

## 2014-11-20 NOTE — ED Notes (Signed)
BEHAVIORAL HEALTH ROUNDING Patient sleeping: Yes.   Patient alert and oriented: yes Behavior appropriate: Yes.  ; If no, describe: n/a Nutrition and fluids offered: Yes  Toileting and hygiene offered: Yes  Sitter present: yes Law enforcement present: Yes   ENVIRONMENTAL ASSESSMENT Potentially harmful objects out of patient reach: Yes.   Personal belongings secured: Yes.   Patient dressed in hospital provided attire only: Yes.   Plastic bags out of patient reach: Yes.   Patient care equipment (cords, cables, call bells, lines, and drains) shortened, removed, or accounted for: Yes.   Equipment and supplies removed from bottom of stretcher: Yes.   Potentially toxic materials out of patient reach: Yes.   Sharps container removed or out of patient reach: Yes.

## 2014-11-20 NOTE — ED Notes (Signed)
BEHAVIORAL HEALTH ROUNDING Patient sleeping: Yes.   Patient alert and oriented: yes Behavior appropriate: Yes.  ; If no, describe: n/a Nutrition and fluids offered: Yes  Toileting and hygiene offered: Yes  Sitter present: yes Law enforcement present: Yes
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
## 2014-11-20 NOTE — ED Notes (Signed)
Patient assigned to appropriate care area. Patient oriented to unit/care area: Informed that, for their safety, care areas are designed for safety and monitored by security cameras at all times; and visiting hours explained to patient. Patient verbalizes understanding, and verbal contract for safety obtained. 

## 2014-12-28 ENCOUNTER — Emergency Department
Admission: EM | Admit: 2014-12-28 | Discharge: 2014-12-28 | Disposition: A | Discharge status: Home / Self Care | Attending: Emergency Medicine | Admitting: Emergency Medicine

## 2014-12-28 ENCOUNTER — Emergency Department

## 2014-12-28 DIAGNOSIS — Y998 Other external cause status: Secondary | ICD-10-CM | POA: Diagnosis not present

## 2014-12-28 DIAGNOSIS — R45851 Suicidal ideations: Secondary | ICD-10-CM | POA: Diagnosis present

## 2014-12-28 DIAGNOSIS — S1190XA Unspecified open wound of unspecified part of neck, initial encounter: Secondary | ICD-10-CM | POA: Diagnosis not present

## 2014-12-28 DIAGNOSIS — F10120 Alcohol abuse with intoxication, uncomplicated: Secondary | ICD-10-CM | POA: Insufficient documentation

## 2014-12-28 DIAGNOSIS — S1091XA Abrasion of unspecified part of neck, initial encounter: Secondary | ICD-10-CM | POA: Diagnosis not present

## 2014-12-28 DIAGNOSIS — IMO0002 Reserved for concepts with insufficient information to code with codable children: Secondary | ICD-10-CM

## 2014-12-28 DIAGNOSIS — Y9389 Activity, other specified: Secondary | ICD-10-CM | POA: Diagnosis not present

## 2014-12-28 DIAGNOSIS — F329 Major depressive disorder, single episode, unspecified: Secondary | ICD-10-CM | POA: Insufficient documentation

## 2014-12-28 DIAGNOSIS — Z7289 Other problems related to lifestyle: Secondary | ICD-10-CM

## 2014-12-28 DIAGNOSIS — Y9289 Other specified places as the place of occurrence of the external cause: Secondary | ICD-10-CM | POA: Diagnosis not present

## 2014-12-28 DIAGNOSIS — T1491XA Suicide attempt, initial encounter: Secondary | ICD-10-CM

## 2014-12-28 DIAGNOSIS — Z720 Tobacco use: Secondary | ICD-10-CM | POA: Diagnosis not present

## 2014-12-28 DIAGNOSIS — S1191XA Laceration without foreign body of unspecified part of neck, initial encounter: Secondary | ICD-10-CM

## 2014-12-28 DIAGNOSIS — X781XXA Intentional self-harm by knife, initial encounter: Secondary | ICD-10-CM | POA: Insufficient documentation

## 2014-12-28 DIAGNOSIS — F10920 Alcohol use, unspecified with intoxication, uncomplicated: Secondary | ICD-10-CM

## 2014-12-28 DIAGNOSIS — F32A Depression, unspecified: Secondary | ICD-10-CM

## 2014-12-28 LAB — COMPREHENSIVE METABOLIC PANEL
ALT: 23 U/L (ref 17–63)
ANION GAP: 10 (ref 5–15)
AST: 34 U/L (ref 15–41)
Albumin: 4.2 g/dL (ref 3.5–5.0)
Alkaline Phosphatase: 64 U/L (ref 38–126)
BUN: 6 mg/dL (ref 6–20)
CO2: 22 mmol/L (ref 22–32)
Calcium: 8.2 mg/dL — ABNORMAL LOW (ref 8.9–10.3)
Chloride: 104 mmol/L (ref 101–111)
Creatinine, Ser: 0.82 mg/dL (ref 0.61–1.24)
GFR calc non Af Amer: >60 mL/min (ref >60)
GLUCOSE: 101 mg/dL — AB (ref 65–99)
Potassium: 3.5 mmol/L (ref 3.5–5.1)
SODIUM: 136 mmol/L (ref 135–145)
Total Bilirubin: 0.4 mg/dL (ref 0.3–1.2)
Total Protein: 7.1 g/dL (ref 6.5–8.1)

## 2014-12-28 LAB — CBC WITH DIFFERENTIAL/PLATELET
BASOS ABS: 0.1 10*3/uL (ref 0–0.1)
BASOS PCT: 1 %
Eosinophils Absolute: 0.1 10*3/uL (ref 0–0.7)
Eosinophils Relative: 1 %
HEMATOCRIT: 42.2 % (ref 40.0–52.0)
Hemoglobin: 14.3 g/dL (ref 13.0–18.0)
LYMPHS PCT: 31 %
Lymphs Abs: 3 10*3/uL (ref 1.0–3.6)
MCH: 32.7 pg (ref 26.0–34.0)
MCHC: 33.9 g/dL (ref 32.0–36.0)
MCV: 96.5 fL (ref 80.0–100.0)
Monocytes Absolute: 0.6 10*3/uL (ref 0.2–1.0)
Monocytes Relative: 6 %
NEUTROS ABS: 5.9 10*3/uL (ref 1.4–6.5)
NEUTROS PCT: 61 %
Platelets: 177 10*3/uL (ref 150–440)
RBC: 4.38 MIL/uL — AB (ref 4.40–5.90)
RDW: 12.7 % (ref 11.5–14.5)
WBC: 9.7 10*3/uL (ref 3.8–10.6)

## 2014-12-28 LAB — SALICYLATE LEVEL

## 2014-12-28 LAB — TYPE AND SCREEN
ABO/RH(D): O NEG
Antibody Screen: NEGATIVE

## 2014-12-28 LAB — ABO/RH: ABO/RH(D): O NEG

## 2014-12-28 LAB — PROTIME-INR
INR: 0.9
PROTHROMBIN TIME: 12.4 s (ref 11.4–15.0)

## 2014-12-28 LAB — ACETAMINOPHEN LEVEL: Acetaminophen (Tylenol), Serum: <10 ug/mL — ABNORMAL LOW (ref 10–30)

## 2014-12-28 LAB — ETHANOL: Alcohol, Ethyl (B): 224 mg/dL — ABNORMAL HIGH (ref <5)

## 2014-12-28 MED ORDER — SODIUM CHLORIDE 0.9 % IV BOLUS (SEPSIS)
1000.0000 mL | Freq: Once | INTRAVENOUS | Status: AC
  Administered 2014-12-28: 1000 mL via INTRAVENOUS

## 2014-12-28 MED ORDER — MORPHINE SULFATE 4 MG/ML IJ SOLN
INTRAMUSCULAR | Status: AC
  Filled 2014-12-28: qty 1

## 2014-12-28 MED ORDER — THIAMINE HCL 100 MG/ML IJ SOLN
INTRAMUSCULAR | Status: AC
  Filled 2014-12-28: qty 2

## 2014-12-28 MED ORDER — LORAZEPAM 2 MG/ML IJ SOLN: 0.0000 mg | Freq: Four times a day (QID) | INTRAMUSCULAR | Status: DC

## 2014-12-28 MED ORDER — IOHEXOL 350 MG/ML SOLN
80.0000 mL | Freq: Once | INTRAVENOUS | Status: AC | PRN
  Administered 2014-12-28: 80 mL via INTRAVENOUS

## 2014-12-28 MED ORDER — ONDANSETRON HCL 4 MG/2ML IJ SOLN
INTRAMUSCULAR | Status: AC
  Filled 2014-12-28: qty 2

## 2014-12-28 MED ORDER — THIAMINE HCL 100 MG/ML IJ SOLN
Freq: Once | INTRAVENOUS | Status: AC
  Administered 2014-12-28: 04:00:00 via INTRAVENOUS
  Filled 2014-12-28: qty 1000

## 2014-12-28 NOTE — [facility] Counselor (Signed)
Late Entry---------Patient wasn't seen by TTS, for an assessment. He was seen by Psychiatrist prior to TSS, had the chance to see him. He had orders for discharge. Thus, Probation officer provided him with Outpatient Referral Information with [facility] and instructions on how to access Mobile Crisis [phone]) through [facility]. Patient stated he is being followed, by his PCP, Dr. Ellin Mayhew.
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
## 2014-12-28 NOTE — ED Notes (Signed)
BEHAVIORAL HEALTH ROUNDING Patient sleeping: No. Patient alert and oriented: not applicable Behavior appropriate: Yes.  ; If no, describe:  Nutrition and fluids offered: No Toileting and hygiene offered: No Sitter present: not applicable Law enforcement present: Yes

## 2014-12-28 NOTE — ED Notes (Signed)
Patient assigned to appropriate care area. Patient oriented to unit/care area: Informed that, for their safety, care areas are designed for safety and monitored by security cameras at all times; and visiting hours explained to patient. Patient verbalizes understanding, and verbal contract for safety obtained. 

## 2014-12-28 NOTE — Consult Note (Signed)
[place] Psychiatry Consult   Reason for Consult:  Follow up Referring Physician:  Er Patient Identification: Jesse Obrien MRN:  [id number] Principal Diagnosis: <principal problem not specified> Diagnosis:  There are no active problems to display for this patient.   Total Time spent with patient: 45 minutes  Subjective:   Jesse Obrien is a 54 y.o. male patient admitted with a lon g H/O alcohol drinking and is not employed and lives with his wife that works. Pt has started drinking and was intoxicated and threatened to hurt himself and was brought here for help.  HPI:  Had 3 DWI's for alcohol drinking. Denies any other street or prescription  Drug abuse. Pt  Lost his DL and was recommended follow up with [facility] with DR. Su after his discharge from [place] unit at [facility].  Pt refuses to go and prefers to get follow up by his PCP Dr.Burget in [facility].  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 HPI Elements:     Past Medical History: History reviewed. No pertinent past medical history. No past surgical history on file. Family History: No family history on file. Social History:  History  Alcohol Use  . Yes     History  Drug Use Not on file    History   Social History  . Marital Status: Married    Spouse Name: N/A  . Number of Children: N/A  . Years of Education: N/A   Social History Main Topics  . Smoking status: Current Every Day Smoker  . Smokeless tobacco: Not on file  . Alcohol Use: Yes  . Drug Use: Not on file  . Sexual Activity: Not on file   Other Topics Concern  . None   Social History Narrative   Additional Social History:                          Allergies:  No Known Allergies  Labs:  Results for orders placed or performed during the hospital encounter of 12/28/14 (from the past 48 hour(s))  CBC with Differential     Status: Abnormal   Collection Time: 12/28/14  2:56 AM  Result  Value Ref Range   WBC 9.7 3.8 - 10.6 K/uL   RBC 4.38 (L) 4.40 - 5.90 MIL/uL   Hemoglobin 14.3 13.0 - 18.0 g/dL   HCT 42.2 40.0 - 52.0 %   MCV 96.5 80.0 - 100.0 fL   MCH 32.7 26.0 - 34.0 pg   MCHC 33.9 32.0 - 36.0 g/dL   RDW 12.7 11.5 - 14.5 %   Platelets 177 150 - 440 K/uL   Neutrophils Relative % 61 %   Neutro Abs 5.9 1.4 - 6.5 K/uL   Lymphocytes Relative 31 %   Lymphs Abs 3.0 1.0 - 3.6 K/uL   Monocytes Relative 6 %   Monocytes Absolute 0.6 0.2 - 1.0 K/uL   Eosinophils Relative 1 %   Eosinophils Absolute 0.1 0 - 0.7 K/uL   Basophils Relative 1 %   Basophils Absolute 0.1 0 - 0.1 K/uL  Comprehensive metabolic panel     Status: Abnormal   Collection Time: 12/28/14  2:56 AM  Result Value Ref Range   Sodium 136 135 - 145 mmol/L   Potassium 3.5 3.5 - 5.1 mmol/L   Chloride 104 101 - 111 mmol/L   CO2 22 22 - 32 mmol/L   Glucose, Bld 101 (  H) 65 - 99 mg/dL   BUN 6 6 - 20 mg/dL   Creatinine, Ser 0.82 0.61 - 1.24 mg/dL   Calcium 8.2 (L) 8.9 - 10.3 mg/dL   Total Protein 7.1 6.5 - 8.1 g/dL   Albumin 4.2 3.5 - 5.0 g/dL   AST 34 15 - 41 U/L   ALT 23 17 - 63 U/L   Alkaline Phosphatase 64 38 - 126 U/L   Total Bilirubin 0.4 0.3 - 1.2 mg/dL   GFR calc non Af Amer >60 >60 mL/min   GFR calc Af Amer >60 >60 mL/min    Comment: (NOTE) The eGFR has been calculated using the CKD EPI equation. This calculation has not been validated in all clinical situations. eGFR's persistently <60 mL/min signify possible Chronic Kidney Disease.    Anion gap 10 5 - 15  Ethanol     Status: Abnormal   Collection Time: 12/28/14  2:56 AM  Result Value Ref Range   Alcohol, Ethyl (B) 224 (H) <5 mg/dL    Comment:        LOWEST DETECTABLE LIMIT FOR SERUM ALCOHOL IS 5 mg/dL FOR MEDICAL PURPOSES ONLY   Protime-INR     Status: None   Collection Time: 12/28/14  2:56 AM  Result Value Ref Range   Prothrombin Time 12.4 11.4 - 15.0 seconds   INR 0.90   Acetaminophen level     Status: Abnormal   Collection Time:  12/28/14  2:56 AM  Result Value Ref Range   Acetaminophen (Tylenol), Serum <10 (L) 10 - 30 ug/mL    Comment:        THERAPEUTIC CONCENTRATIONS VARY SIGNIFICANTLY. A RANGE OF 10-30 ug/mL MAY BE AN EFFECTIVE CONCENTRATION FOR MANY PATIENTS. HOWEVER, SOME ARE BEST TREATED AT CONCENTRATIONS OUTSIDE THIS RANGE. ACETAMINOPHEN CONCENTRATIONS >150 ug/mL AT 4 HOURS AFTER INGESTION AND >50 ug/mL AT 12 HOURS AFTER INGESTION ARE OFTEN ASSOCIATED WITH TOXIC REACTIONS.   Salicylate level     Status: None   Collection Time: 12/28/14  2:56 AM  Result Value Ref Range   Salicylate Lvl <3.1 2.8 - 30.0 mg/dL  Type and screen for Red Blood Exchange     Status: None   Collection Time: 12/28/14  2:56 AM  Result Value Ref Range   ABO/RH(D) O NEG    Antibody Screen NEG    Sample Expiration 12/31/2014   ABO/Rh     Status: None   Collection Time: 12/28/14  2:56 AM  Result Value Ref Range   ABO/RH(D) O NEG     Vitals: Blood pressure 130/80, pulse 95, temperature 98.6 F (37 C), temperature source Oral, resp. rate 16, height '5\' 11"'  (1.803 m), weight 92.987 kg (205 lb), SpO2 95 %.  Risk to Self: Is patient at risk for suicide?: Yes Risk to Others:   Prior Inpatient Therapy:   Prior Outpatient Therapy:    Current Facility-Administered Medications  Medication Dose Route Frequency Provider Last Rate Last Dose  . LORazepam (ATIVAN) injection 0-4 mg  0-4 mg Intravenous 4 times per day Paulette Blanch, MD       No current outpatient prescriptions on file.    Musculoskeletal: Strength & Muscle Tone: within normal limits Gait & Station: normal Patient leans: N/A  Psychiatric Specialty Exam: Physical Exam  Review of Systems  Constitutional: Negative.   HENT: Negative.   Eyes: Negative.   Respiratory: Negative.   Cardiovascular: Negative.   Gastrointestinal: Negative.   Genitourinary: Negative.   Musculoskeletal: Negative.   Skin: Negative.  Neurological: Negative.   Endo/Heme/Allergies:  Negative.   Psychiatric/Behavioral: Positive for substance abuse. The patient is nervous/anxious.     Blood pressure 130/80, pulse 95, temperature 98.6 F (37 C), temperature source Oral, resp. rate 16, height '5\' 11"'  (1.803 m), weight 92.987 kg (205 lb), SpO2 95 %.Body mass index is 28.6 kg/(m^2).  General Appearance: Casual  Eye Contact::  Fair  Speech:  Clear and Coherent  Volume:  Normal  Mood:  Anxious  Affect:  Congruent  Thought Process:  Circumstantial  Orientation:  Full (Time, Place, and Person)  Thought Content:  Negative  Suicidal Thoughts:  No  Homicidal Thoughts:  No  Memory:  Immediate;   Fair Recent;   Fair Remote;   Fair adequate  Judgement:  Fair  Insight:  Fair  Psychomotor Activity:  Normal  Concentration:  Fair  Recall:  [employer]  Language: Fair  Akathisia:  No  Handed:  Right  AIMS (if indicated):     Assets:  Communication Skills Desire for Improvement Housing Social Support Talents/Skills  ADL's:  Intact  Cognition: WNL  Sleep:      Medical Decision Making: Established Problem, Stable/Improving (1)  Treatment Plan Summary: Plan D/C IVC and discharge pt home with wife and follow up with his PCP and pt will be given information about avilability of various Substance abuse programs in the [place].  Plan:  No evidence of imminent risk to self or others at present.   Disposition: as above  Viona Hosking K 12/28/2014 11:15 AM

## 2014-12-28 NOTE — Discharge Instructions (Signed)
Alcohol Use Disorder Alcohol use disorder is a mental disorder. It is not a one-time incident of heavy drinking. Alcohol use disorder is the excessive and uncontrollable use of alcohol over time that leads to problems with functioning in one or more areas of daily living. People with this disorder risk harming themselves and others when they drink to excess. Alcohol use disorder also can cause other mental disorders, such as mood and anxiety disorders, and serious physical problems. People with alcohol use disorder often misuse other drugs.  Alcohol use disorder is common and widespread. Some people with this disorder drink alcohol to cope with or escape from negative life events. Others drink to relieve chronic pain or symptoms of mental illness. People with a family history of alcohol use disorder are at higher risk of losing control and using alcohol to excess.  SYMPTOMS  Signs and symptoms of alcohol use disorder may include the following:   Consumption ofalcohol inlarger amounts or over a longer period of time than intended.  Multiple unsuccessful attempts to cutdown or control alcohol use.   A great deal of time spent obtaining alcohol, using alcohol, or recovering from the effects of alcohol (hangover).  A strong desire or urge to use alcohol (cravings).   Continued use of alcohol despite problems at work, school, or home because of alcohol use.   Continued use of alcohol despite problems in relationships because of alcohol use.  Continued use of alcohol in situations when it is physically hazardous, such as driving a car.  Continued use of alcohol despite awareness of a physical or psychological problem that is likely related to alcohol use. Physical problems related to alcohol use can involve the brain, heart, liver, stomach, and intestines. Psychological problems related to alcohol use include intoxication, depression, anxiety, psychosis, delirium, and dementia.   The need for  increased amounts of alcohol to achieve the same desired effect, or a decreased effect from the consumption of the same amount of alcohol (tolerance).  Withdrawal symptoms upon reducing or stopping alcohol use, or alcohol use to reduce or avoid withdrawal symptoms. Withdrawal symptoms include:  Racing heart.  Hand tremor.  Difficulty sleeping.  Nausea.  Vomiting.  Hallucinations.  Restlessness.  Seizures. DIAGNOSIS Alcohol use disorder is diagnosed through an assessment by your health care provider. Your health care provider may start by asking three or four questions to screen for excessive or problematic alcohol use. To confirm a diagnosis of alcohol use disorder, at least two symptoms must be present within a 12-month period. The severity of alcohol use disorder depends on the number of symptoms:  Mild--two or three.  Moderate--four or five.  Severe--six or more. Your health care provider may perform a physical exam or use results from lab tests to see if you have physical problems resulting from alcohol use. Your health care provider may refer you to a mental health professional for evaluation. TREATMENT  Some people with alcohol use disorder are able to reduce their alcohol use to low-risk levels. Some people with alcohol use disorder need to quit drinking alcohol. When necessary, mental health professionals with specialized training in substance use treatment can help. Your health care provider can help you decide how severe your alcohol use disorder is and what type of treatment you need. The following forms of treatment are available:   Detoxification. Detoxification involves the use of prescription medicines to prevent alcohol withdrawal symptoms in the first week after quitting. This is important for people with a history of symptoms   of withdrawal and for heavy drinkers who are likely to have withdrawal symptoms. Alcohol withdrawal can be dangerous and, in severe cases, cause  death. Detoxification is usually provided in a hospital or in-patient substance use treatment facility.  Counseling or talk therapy. Talk therapy is provided by substance use treatment counselors. It addresses the reasons people use alcohol and ways to keep them from drinking again. The goals of talk therapy are to help people with alcohol use disorder find healthy activities and ways to cope with life stress, to identify and avoid triggers for alcohol use, and to handle cravings, which can cause relapse.  Medicines.Different medicines can help treat alcohol use disorder through the following actions:  Decrease alcohol cravings.  Decrease the positive reward response felt from alcohol use.  Produce an uncomfortable physical reaction when alcohol is used (aversion therapy).  Support groups. Support groups are run by people who have quit drinking. They provide emotional support, advice, and guidance. These forms of treatment are often combined. Some people with alcohol use disorder benefit from intensive combination treatment provided by specialized substance use treatment centers. Both inpatient and outpatient treatment programs are available. Document Released: 07/28/2004 Document Revised: 11/04/2013 Document Reviewed: 09/27/2012 Exit[employer] Patient Information 2015 [employer]. This information is not intended to replace advice given to you by your health care provider. Make sure you discuss any questions you have with your health care provider.  

## 2014-12-28 NOTE — ED Notes (Signed)
ENVIRONMENTAL ASSESSMENT Potentially harmful objects out of patient reach: No. Personal belongings secured: Yes.   Patient dressed in hospital provided attire only: Yes.   Plastic bags out of patient reach: Yes.   Patient care equipment (cords, cables, call bells, lines, and drains) shortened, removed, or accounted for: No. Equipment and supplies removed from bottom of stretcher: Yes.   Potentially toxic materials out of patient reach: Yes.   Sharps container removed or out of patient reach: Yes.

## 2014-12-28 NOTE — ED Notes (Signed)
Laceration to right neck, clean and dry, no bleeding or drainage at this time

## 2014-12-28 NOTE — ED Notes (Signed)
BEHAVIORAL HEALTH ROUNDING Patient sleeping: No. Patient alert and oriented: yes Behavior appropriate: Yes.  ; If no, describe:  Nutrition and fluids offered: Yes  Toileting and hygiene offered: Yes  Sitter present: yes Law enforcement present: Yes  

## 2014-12-28 NOTE — ED Notes (Addendum)
BEHAVIORAL HEALTH ROUNDING Patient sleeping: Yes.   Patient alert and oriented: sleeping Behavior appropriate: No.; If no, describe: sleeping Nutrition and fluids offered: No Toileting and hygiene offered: No Sitter present: yes Law enforcement present: Yes

## 2014-12-28 NOTE — ED Provider Notes (Signed)
-----------------------------------------   11:59 AM on 12/28/2014 -----------------------------------------   BP 130/80 mmHg  Pulse 95  Temp(Src) 98.6 F (37 C) (Oral)  Resp 16  Ht 5\' 11"  (1.803 m)  Wt 205 lb (92.987 kg)  BMI 28.60 kg/m2  SpO2 95%  The patient had no acute events since last update.  Calm and cooperative at this time.  IVC rescinded by Dr. Franchot Mimes.  Patient is not suicidal or homicidal at this time. Not clinically intoxicated. Will be discharged with [facility] follow-up.   Orbie Pyo, MD 12/28/14 [phone]

## 2014-12-28 NOTE — ED Notes (Signed)
Pt given lunch tray.

## 2014-12-28 NOTE — ED Notes (Signed)

## 2014-12-28 NOTE — ED Notes (Signed)
Pt moved to treatment room 21; calm and cooperative; pt says he is aware when his alcohol begins to wear off; says it usually takes 24-48 hours from last drink, which was at 1am; pt understands signs and symptoms of withdrawal and will notify staff of any changes before next scheduled check;
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
## 2014-12-28 NOTE — ED Notes (Signed)
ED BHU [place] Is the patient under IVC or is there intent for IVC: Yes.   Is the patient medically cleared: Yes.   Is there vacancy in the ED BHU: Yes.   Is the population mix appropriate for patient: Yes.   Is the patient awaiting placement in inpatient or outpatient setting: Yes.   Has the patient had a psychiatric consult: No., ordered Survey of unit performed for contraband, proper placement and condition of furniture, tampering with fixtures in bathroom, shower, and each patient room: Yes.  ; APPEARANCE/BEHAVIOR calm NEURO ASSESSMENT Orientation: time, place, person Hallucinations: No. Speech: Normal Gait: normal RESPIRATORY ASSESSMENT Normal expansion.  Clear to auscultation.  No rales, rhonchi, or wheezing. CARDIOVASCULAR ASSESSMENT regular rate and rhythm, S1, S2 normal, no murmur, click, rub or gallop GASTROINTESTINAL ASSESSMENT soft, nontender, BS WNL, no r/g EXTREMITIES normal strength, tone, and muscle mass PLAN OF CARE Provide calm/safe environment. Vital signs assessed twice daily. ED BHU Assessment once each 12-hour shift. Collaborate with intake RN daily or as condition indicates. Assure the ED provider has rounded once each shift. Provide and encourage hygiene. Provide redirection as needed. Assess for escalating behavior; address immediately and inform ED provider.  Assess family dynamic and appropriateness for visitation as needed: Yes.  ; If necessary, describe findings: } Educate the patient/family about BHU procedures/visitation: Yes.  ; If necessary, describe findings:

## 2014-12-28 NOTE — ED Notes (Signed)
BEHAVIORAL HEALTH ROUNDING Patient sleeping: Yes.   Patient alert and oriented: not applicable Behavior appropriate: Yes.  ; If no, describe:  Nutrition and fluids offered: No Toileting and hygiene offered: No Sitter present: not applicable Law enforcement present: Yes  

## 2014-12-28 NOTE — ED Notes (Signed)

## 2014-12-28 NOTE — ED Notes (Signed)
Pt does not now nor has he had an IV since he has been in ED BHU.

## 2014-12-28 NOTE — ED Notes (Signed)
ED B[facility] [place] Is the patient under IVC or is there intent for IVC: Yes.   Is the patient medically cleared: Yes.   Is there vacancy in the ED BHU: Yes.   Is the population mix appropriate for patient: Yes.   Is the patient awaiting placement in inpatient or outpatient setting: No. Has the patient had a psychiatric consult: No Survey of unit performed for contraband, proper placement and condition of furniture, tampering with fixtures in bathroom, shower, and each patient room: Yes.  ; Findings:  APPEARANCE/BEHAVIOR calm and adequate rapport can be established NEURO ASSESSMENT Orientation: time, place and person Hallucinations: No.None noted (Hallucinations) Speech: Normal Gait: normal RESPIRATORY ASSESSMENT Normal expansion.  Clear to auscultation.  No rales, rhonchi, or wheezing. CARDIOVASCULAR ASSESSMENT regular rate and rhythm, S1, S2 normal, no murmur, click, rub or gallop GASTROINTESTINAL ASSESSMENT soft, nontender, BS WNL, no r/g EXTREMITIES normal strength, tone, and muscle mass PLAN OF CARE Provide calm/safe environment. Vital signs assessed twice daily. ED BHU Assessment once each 12-hour shift. Collaborate with intake RN daily or as condition indicates. Assure the ED provider has rounded once each shift. Provide and encourage hygiene. Provide redirection as needed. Assess for escalating behavior; address immediately and inform ED provider.  Assess family dynamic and appropriateness for visitation as needed: Yes.  ; If necessary, describe findings:  Educate the patient/family about BHU procedures/visitation: Yes.  ; If necessary, describe findings:

## 2015-01-01 ENCOUNTER — Emergency Department
Admission: EM | Admit: 2015-01-01 | Discharge: 2015-01-03 | Disposition: A | Discharge status: Other Acute Inpatient Hospital | Attending: Emergency Medicine | Admitting: Emergency Medicine

## 2015-01-01 DIAGNOSIS — F1024 Alcohol dependence with alcohol-induced mood disorder: Secondary | ICD-10-CM | POA: Diagnosis not present

## 2015-01-01 DIAGNOSIS — F329 Major depressive disorder, single episode, unspecified: Secondary | ICD-10-CM

## 2015-01-01 DIAGNOSIS — F32A Depression, unspecified: Secondary | ICD-10-CM

## 2015-01-01 DIAGNOSIS — X781XXA Intentional self-harm by knife, initial encounter: Secondary | ICD-10-CM | POA: Insufficient documentation

## 2015-01-01 DIAGNOSIS — S1191XA Laceration without foreign body of unspecified part of neck, initial encounter: Secondary | ICD-10-CM | POA: Insufficient documentation

## 2015-01-01 DIAGNOSIS — L089 Local infection of the skin and subcutaneous tissue, unspecified: Secondary | ICD-10-CM

## 2015-01-01 DIAGNOSIS — I10 Essential (primary) hypertension: Secondary | ICD-10-CM

## 2015-01-01 DIAGNOSIS — T148XXA Other injury of unspecified body region, initial encounter: Secondary | ICD-10-CM

## 2015-01-01 DIAGNOSIS — Y998 Other external cause status: Secondary | ICD-10-CM | POA: Diagnosis not present

## 2015-01-01 DIAGNOSIS — T1491 Suicide attempt: Secondary | ICD-10-CM | POA: Diagnosis present

## 2015-01-01 DIAGNOSIS — IMO0002 Reserved for concepts with insufficient information to code with codable children: Secondary | ICD-10-CM

## 2015-01-01 DIAGNOSIS — F911 Conduct disorder, childhood-onset type: Secondary | ICD-10-CM | POA: Diagnosis not present

## 2015-01-01 DIAGNOSIS — Y9289 Other specified places as the place of occurrence of the external cause: Secondary | ICD-10-CM | POA: Insufficient documentation

## 2015-01-01 DIAGNOSIS — F10129 Alcohol abuse with intoxication, unspecified: Secondary | ICD-10-CM | POA: Insufficient documentation

## 2015-01-01 DIAGNOSIS — R45851 Suicidal ideations: Secondary | ICD-10-CM

## 2015-01-01 DIAGNOSIS — Z720 Tobacco use: Secondary | ICD-10-CM | POA: Insufficient documentation

## 2015-01-01 DIAGNOSIS — Y9389 Activity, other specified: Secondary | ICD-10-CM | POA: Insufficient documentation

## 2015-01-01 HISTORY — DX: Essential (primary) hypertension: I10

## 2015-01-01 LAB — URINE DRUG SCREEN, QUALITATIVE (ARMC ONLY)
Amphetamines, Ur Screen: NOT DETECTED
Barbiturates, Ur Screen: NOT DETECTED
Benzodiazepine, Ur Scrn: NOT DETECTED
COCAINE METABOLITE, UR ~~LOC~~: NOT DETECTED
Cannabinoid 50 Ng, Ur ~~LOC~~: NOT DETECTED
MDMA (Ecstasy)Ur Screen: NOT DETECTED
Methadone Scn, Ur: NOT DETECTED
OPIATE, UR SCREEN: NOT DETECTED
PHENCYCLIDINE (PCP) UR S: NOT DETECTED
TRICYCLIC, UR SCREEN: NOT DETECTED

## 2015-01-01 LAB — CBC WITH DIFFERENTIAL/PLATELET
BASOS ABS: 0.1 10*3/uL (ref 0–0.1)
BASOS PCT: 1 %
EOS ABS: 0.2 10*3/uL (ref 0–0.7)
Eosinophils Relative: 2 %
HCT: 43 % (ref 40.0–52.0)
Hemoglobin: 14.5 g/dL (ref 13.0–18.0)
Lymphocytes Relative: 31 %
Lymphs Abs: 3.6 10*3/uL (ref 1.0–3.6)
MCH: 32.4 pg (ref 26.0–34.0)
MCHC: 33.6 g/dL (ref 32.0–36.0)
MCV: 96.4 fL (ref 80.0–100.0)
MONO ABS: 0.8 10*3/uL (ref 0.2–1.0)
MONOS PCT: 7 %
NEUTROS ABS: 6.9 10*3/uL — AB (ref 1.4–6.5)
Neutrophils Relative %: 59 %
Platelets: 170 10*3/uL (ref 150–440)
RBC: 4.46 MIL/uL (ref 4.40–5.90)
RDW: 12.7 % (ref 11.5–14.5)
WBC: 11.7 10*3/uL — ABNORMAL HIGH (ref 3.8–10.6)

## 2015-01-01 LAB — COMPREHENSIVE METABOLIC PANEL
ALBUMIN: 4.4 g/dL (ref 3.5–5.0)
ALT: 34 U/L (ref 17–63)
ANION GAP: 14 (ref 5–15)
AST: 39 U/L (ref 15–41)
Alkaline Phosphatase: 69 U/L (ref 38–126)
BUN: 7 mg/dL (ref 6–20)
CALCIUM: 9.1 mg/dL (ref 8.9–10.3)
CO2: 21 mmol/L — AB (ref 22–32)
CREATININE: 0.91 mg/dL (ref 0.61–1.24)
Chloride: 102 mmol/L (ref 101–111)
GFR calc Af Amer: >60 mL/min (ref >60)
GFR calc non Af Amer: >60 mL/min (ref >60)
GLUCOSE: 102 mg/dL — AB (ref 65–99)
POTASSIUM: 3.5 mmol/L (ref 3.5–5.1)
SODIUM: 137 mmol/L (ref 135–145)
Total Bilirubin: 0.5 mg/dL (ref 0.3–1.2)
Total Protein: 7.4 g/dL (ref 6.5–8.1)

## 2015-01-01 LAB — SALICYLATE LEVEL: SALICYLATE LVL: 9.8 mg/dL (ref 2.8–30.0)

## 2015-01-01 LAB — ETHANOL: Alcohol, Ethyl (B): 265 mg/dL — ABNORMAL HIGH (ref <5)

## 2015-01-01 LAB — ACETAMINOPHEN LEVEL: Acetaminophen (Tylenol), Serum: <10 ug/mL — ABNORMAL LOW (ref 10–30)

## 2015-01-01 MED ORDER — ZIPRASIDONE MESYLATE 20 MG IM SOLR
20.0000 mg | Freq: Once | INTRAMUSCULAR | Status: AC
  Administered 2015-01-01: 20 mg via INTRAMUSCULAR

## 2015-01-01 MED ORDER — ZIPRASIDONE MESYLATE 20 MG IM SOLR
INTRAMUSCULAR | Status: AC
  Administered 2015-01-01: 20 mg via INTRAMUSCULAR
  Filled 2015-01-01: qty 20

## 2015-01-01 NOTE — ED Provider Notes (Signed)
[facility] Emergency Department Provider Note     Time seen: ----------------------------------------- 9:15 PM on 01/01/2015 -----------------------------------------    I have reviewed the triage vital signs and the nursing notes.   HISTORY  Chief Complaint Suicide Attempt    HPI Jesse Obrien is a 54 y.o. male brought to ER for neck laceration he was found cutting himself with a knife in a razor. Patient states he wants to be restrained to keep himself from cutting his own neck. Patient reports because neck while intoxicated this evening. Has history of same. Does not describe any complaints has no trouble breathing or swallowing.   No past medical history on file.  There are no active problems to display for this patient.   No past surgical history on file.  Allergies Review of patient's allergies indicates not on file.  Social History History  Substance Use Topics  . Smoking status: Current Every Day Smoker  . Smokeless tobacco: Not on file  . Alcohol Use: Yes    Review of Systems Constitutional: Negative for fever. Eyes: Negative for visual changes. ENT: Positive for neck laceration Cardiovascular: Negative for chest pain. Respiratory: Negative for shortness of breath. Gastrointestinal: Negative for abdominal pain, vomiting and diarrhea. Genitourinary: Negative for dysuria. Musculoskeletal: Negative for back pain. Skin: Positive for lacerations and abrasions Neurological: Negative for headaches, focal weakness or numbness.  10-point ROS otherwise negative.  ____________________________________________   PHYSICAL EXAM:  VITAL SIGNS: ED Triage Vitals  Enc Vitals Group     BP 01/01/15 2106 170/121 mmHg     Pulse Rate 01/01/15 2106 124     Resp 01/01/15 2106 16     Temp 01/01/15 2106 98.6 F (37 C)     Temp Source 01/01/15 2106 Oral     SpO2 01/01/15 2106 97 %     Weight 01/01/15 2106 205 lb (92.987 kg)     Height  01/01/15 2106 5\' 10"  (1.778 m)     Head Cir --      Peak Flow --      Pain Score 01/01/15 2107 5     Pain Loc --      Pain Edu? --      Excl. in [place]? --     Constitutional: Alert , disoriented. No acute distress Eyes: Conjunctivae are normal. PERRL. Normal extraocular movements. ENT   Head: Normocephalic and atraumatic.   Nose: No congestion/rhinnorhea.   Mouth/Throat: Mucous membranes are moist.   Neck: Patient has sustained zone 2 laceration of the neck. It's 5 cm anterior to the right of midline. Does not violate the platysma muscle Hematological/Lymphatic/Immunilogical: No cervical lymphadenopathy. Cardiovascular: Normal rate, regular rhythm. Normal and symmetric distal pulses are present in all extremities. No murmurs, rubs, or gallops. Respiratory: Normal respiratory effort without tachypnea nor retractions. Breath sounds are clear and equal bilaterally. No wheezes/rales/rhonchi. Gastrointestinal: Soft and nontender. No distention. No abdominal bruits. There is no CVA tenderness. Musculoskeletal: Nontender with normal range of motion in all extremities. No joint effusions.  No lower extremity tenderness nor edema. Neurologic:  Slightly slurred speech from intoxication. No other focal neurologic deficits. Skin:  Skin is warm, dry and intact. No rash noted. Psychiatric: Patient with aggressive behavior, positive suicidal ideation and intoxication  ____________________________________________  ED COURSE:  Pertinent labs & imaging results that were available during my care of the patient were reviewed by me and considered in my medical decision making (see chart for details). LACERATION REPAIR Performed by: Earleen Newport Authorized  by: Earleen Newport Consent: Verbal consent obtained. Risks and benefits: risks, benefits and alternatives were discussed Consent given by: patient Patient identity confirmed: provided demographic data Prepped and Draped in  normal sterile fashion Wound explored  Laceration Location: Zone 2 on the right, anterior neck   Laceration Length: 5 cm  No Foreign Bodies seen or palpated  Anesthesia: None   Irrigation method: A saline soaked gauze  Amount of cleaning: standard  Skin closure: Dermabond   Number of sutures: None   Patient tolerance: Patient tolerated the procedure well with no immediate complications.  Due to the patient's persistent degree of self injury, decided to close with tissue glue rather than suturing or staples which she could rip out later. Again this laceration does not violate the platysma muscle and should not need any further imaging or repair. ____________________________________________    LABS (pertinent positives/negatives)  Labs Reviewed  CBC WITH DIFFERENTIAL/PLATELET - Abnormal; Notable for the following:    WBC 11.7 (*)    Neutro Abs 6.9 (*)    All other components within normal limits  COMPREHENSIVE METABOLIC PANEL - Abnormal; Notable for the following:    CO2 21 (*)    Glucose, Bld 102 (*)    All other components within normal limits  URINE DRUG SCREEN, QUALITATIVE ([facility] ONLY)  ETHANOL  ACETAMINOPHEN LEVEL  SALICYLATE LEVEL    RADIOLOGY Images were viewed by me  None  ____________________________________________  FINAL ASSESSMENT AND PLAN  Ethanol intoxication, suicidal ideation with attempt via cutting his own throat  Plan: Patient with labs as dictated above. Patient requiring Geodon for sedation. Patient was threatening to rip open his neck wound.   Earleen Newport, MD   Earleen Newport, MD 01/01/15 [phone]

## 2015-01-01 NOTE — ED Notes (Addendum)
Patient with laceration to his neck. Patient cut himself with a knife and razor. Bleeding stable at this time

## 2015-01-02 ENCOUNTER — Encounter: Payer: Self-pay | Admitting: Psychiatry

## 2015-01-02 DIAGNOSIS — F1024 Alcohol dependence with alcohol-induced mood disorder: Secondary | ICD-10-CM | POA: Diagnosis not present

## 2015-01-02 DIAGNOSIS — IMO0002 Reserved for concepts with insufficient information to code with codable children: Secondary | ICD-10-CM

## 2015-01-02 DIAGNOSIS — I10 Essential (primary) hypertension: Secondary | ICD-10-CM

## 2015-01-02 DIAGNOSIS — L089 Local infection of the skin and subcutaneous tissue, unspecified: Secondary | ICD-10-CM

## 2015-01-02 HISTORY — DX: Essential (primary) hypertension: I10

## 2015-01-02 MED ORDER — IBUPROFEN 600 MG PO TABS
ORAL_TABLET | ORAL | Status: AC
  Administered 2015-01-02: 600 mg via ORAL
  Filled 2015-01-02: qty 1

## 2015-01-02 MED ORDER — IBUPROFEN 600 MG PO TABS
600.0000 mg | ORAL_TABLET | Freq: Once | ORAL | Status: AC
  Administered 2015-01-02: 600 mg via ORAL

## 2015-01-02 NOTE — ED Notes (Signed)
Patient assigned to appropriate care area. Patient oriented to unit/care area: Informed that, for their safety, care areas are designed for safety and monitored by staff at all times; and visiting hours explained to patient. Patient verbalizes understanding, and verbal contract for safety obtained. 

## 2015-01-02 NOTE — ED Notes (Signed)
BEHAVIORAL HEALTH ROUNDING Patient sleeping: No. Patient alert and oriented: yes Behavior appropriate: Yes.  ; If no, describe:  Nutrition and fluids offered: Yes  Toileting and hygiene offered: Yes  Sitter present: no Law enforcement present: Yes  

## 2015-01-02 NOTE — Consult Note (Signed)
  Psychiatry: Follow-up for this 54 year old man with repeated attempts to cut his neck open. On further evaluation and consideration I neither feel comfortable with sending him home nor at this point I think that simply sitting in the emergency room is likely to be of any benefit. Although alcohol dependence is probably his primary problem his mood disorder and behavior are obviously very dangerous as well. Based on this, I will admit him to the psychiatry ward. Orders will be completed. Diagnosis of depression.

## 2015-01-02 NOTE — ED Notes (Signed)
BEHAVIORAL HEALTH ROUNDING Patient sleeping: Yes.   Patient alert and oriented: asleep Behavior appropriate: Yes.  ; If no, describe:  Nutrition and fluids offered: Yes, water placed in room  Toileting and hygiene offered: asleep Sitter present: no Law enforcement present: Yes, O[place]

## 2015-01-02 NOTE — ED Notes (Signed)
Pt questioning why his neck is still weeping after Dr Jimmye Norman repair when he first arrived in the ER. I discussed with pt that when he lies on his stomach or right side he is putting pressure on the neck wound which is causing the wound to weep small amounts of blood. Pt then stated "Oh so I should lay on my left side". I said yes that would be best. Pt laid down and requested blanket. Pt given warm blanket and is resting on stretcher at this time on his left side.

## 2015-01-02 NOTE — ED Notes (Signed)
BEHAVIORAL HEALTH ROUNDING Patient sleeping: Yes.   Patient alert and oriented: not applicable Behavior appropriate: Yes.    Nutrition and fluids offered: No Toileting and hygiene offered: No Sitter present: q15 minute observations Law enforcement present: Yes [employer] 

## 2015-01-02 NOTE — ED Notes (Signed)
BEHAVIORAL HEALTH ROUNDING Patient sleeping: No. Patient alert and oriented: yes Behavior appropriate: Yes.  ; If no, describe:  Nutrition and fluids offered: Yes  Toileting and hygiene offered: Yes  Sitter present: no Law enforcement present: Yes, [place] 

## 2015-01-02 NOTE — ED Notes (Signed)
Pt resting in bed watching tv. No unusual behavior observed. Pt has no needs or concerns at this time. Will continue to monitor and f/u as needed.  

## 2015-01-02 NOTE — ED Notes (Signed)
Pt report received from [place]. Pt care assumed. Pt sitting in day room watching tv.

## 2015-01-02 NOTE — Consult Note (Signed)
[facility] Face-to-Face Psychiatry Consult   Reason for Consult:  Consult for this 54 year old man with a history of alcohol abuse and suicide attempts. He came to the hospital because of having cut himself on the neck.  Referring Physician:  Karma Greaser Patient Identification: Jesse Obrien MRN:  [id number] Principal Diagnosis: Alcohol dependence with alcohol-induced mood disorder Diagnosis:   Patient Active Problem List   Diagnosis Date Noted  . Alcohol dependence with alcohol-induced mood disorder [F10.24] 01/02/2015  . Laceration [T14.8] 01/02/2015  . Hypertension [I10] 01/02/2015    Total Time spent with patient: 1 hour  Subjective:   XAI FRERKING is a 54 y.o. male patient admitted with patient's chief complaint "I just thought maybe I needed to get stitches".  HPI:  Information from the patient and the chart. Reviewed labs as well. Patient claims that while drunk he cut himself on the neck "a couple of days ago". He says that ever since then it has continued to it and he has been picking at it which makes it continued to bleed. He said he thought that he should come into the emergency room because maybe it needed to get stitches. He tells me that he does not have any suicidal ideation currently. He describes his mood as being nervous labile and up and down. He has chronic stress from his diffuse pain. He admits that he's been drinking he claims about 2 times a week on a binge basis. Wife stated it was more like a 12 pack a day. In eyes that he is abusing other drugs. Patient is not taking any of his prescribed medicine and he gives no good reason for this.  Past psychiatric history: Patient has had several prior hospitalizations and in fact was recently discharged from [facility] [place] where he was in a substance abuse program. He has not followed up with any outpatient treatment and has not been taking his prescribed medicine. He has a history of stabbing himself in the neck in the past.  Social  history: Lives with his wife. He has chronic pain which she attributes to injuries he suffered in the [employer].  Medical history: High blood pressure and chronic pain and a current laceration to his neck.  Family history: Positive for alcohol abuse  Occasions: He is not taking any Substance abuse history: He chronically abuses alcohol. No history of delirium tremens or seizures however  HPI Elements:   Quality:  Intermittent depression and agitation to the point of cutting himself on the neck. Severity:  Severe and life-threatening. Timing:  Happens mostly when intoxicated. He claims it was 2 days ago but I think that is questionable. Duration:  Chronic problem. Context:  Alcohol abuse.  Past Medical History:  Past Medical History  Diagnosis Date  . Hypertension 01/02/2015   No past surgical history on file. Family History: No family history on file. Social History:  History  Alcohol Use  . Yes     History  Drug Use Not on file    History   Social History  . Marital Status: Married    Spouse Name: N/A  . Number of Children: N/A  . Years of Education: N/A   Social History Main Topics  . Smoking status: Current Every Day Smoker  . Smokeless tobacco: Not on file  . Alcohol Use: Yes  . Drug Use: Not on file  . Sexual Activity: Not on file   Other Topics Concern  . Not on file   Social History Narrative  Additional Social History:    History of alcohol / drug use?: Yes Longest period of sobriety (when/how long): unknown Negative Consequences of Use: Personal relationships, Legal Withdrawal Symptoms: Agitation, Aggressive/Assaultive, Irritability Name of Substance 1: Beer 1 - Age of First Use: unsure 1 - Amount (size/oz): 24 oz 1 - Frequency: every day 1 - Duration: all day 1 - Last Use / Amount: yesterday                   Allergies:  Not on File  Labs:  Results for orders placed or performed during the hospital encounter of 01/01/15 (from the past  48 hour(s))  Urine Drug Screen, Qualitative ([place] only)     Status: None   Collection Time: 01/01/15  9:55 PM  Result Value Ref Range   Tricyclic, Ur Screen NONE DETECTED NONE DETECTED   Amphetamines, Ur Screen NONE DETECTED NONE DETECTED   MDMA (Ecstasy)Ur Screen NONE DETECTED NONE DETECTED   Cocaine Metabolite,Ur [place] NONE DETECTED NONE DETECTED   Opiate, Ur Screen NONE DETECTED NONE DETECTED   Phencyclidine (PCP) Ur S NONE DETECTED NONE DETECTED   Cannabinoid 50 Ng, Ur [place] NONE DETECTED NONE DETECTED   Barbiturates, Ur Screen NONE DETECTED NONE DETECTED   Benzodiazepine, Ur Scrn NONE DETECTED NONE DETECTED   Methadone Scn, Ur NONE DETECTED NONE DETECTED    Comment: (NOTE) 153  Tricyclics, urine               Cutoff 1000 ng/mL 200  Amphetamines, urine             Cutoff 1000 ng/mL 300  MDMA (Ecstasy), urine           Cutoff 500 ng/mL 400  Cocaine Metabolite, urine       Cutoff 300 ng/mL 500  Opiate, urine                   Cutoff 300 ng/mL 600  Phencyclidine (PCP), urine      Cutoff 25 ng/mL 700  Cannabinoid, urine              Cutoff 50 ng/mL 800  Barbiturates, urine             Cutoff 200 ng/mL 900  Benzodiazepine, urine           Cutoff 200 ng/mL 1000 Methadone, urine                Cutoff 300 ng/mL 1100 1200 The urine drug screen provides only a preliminary, unconfirmed 1300 analytical test result and should not be used for non-medical 1400 purposes. Clinical consideration and professional judgment should 1500 be applied to any positive drug screen result due to possible 1600 interfering substances. A more specific alternate chemical method 1700 must be used in order to obtain a confirmed analytical result.  1800 Gas chromato graphy / mass spectrometry (GC/MS) is the preferred 1900 confirmatory method.   CBC with Differential/Platelet     Status: Abnormal   Collection Time: 01/01/15  9:56 PM  Result Value Ref Range   WBC 11.7 (H) 3.8 - 10.6 K/uL   RBC 4.46 4.40 - 5.90  MIL/uL   Hemoglobin 14.5 13.0 - 18.0 g/dL   HCT 43.0 40.0 - 52.0 %   MCV 96.4 80.0 - 100.0 fL   MCH 32.4 26.0 - 34.0 pg   MCHC 33.6 32.0 - 36.0 g/dL   RDW 12.7 11.5 - 14.5 %   Platelets 170 150 - 440 K/uL  Neutrophils Relative % 59 %   Neutro Abs 6.9 (H) 1.4 - 6.5 K/uL   Lymphocytes Relative 31 %   Lymphs Abs 3.6 1.0 - 3.6 K/uL   Monocytes Relative 7 %   Monocytes Absolute 0.8 0.2 - 1.0 K/uL   Eosinophils Relative 2 %   Eosinophils Absolute 0.2 0 - 0.7 K/uL   Basophils Relative 1 %   Basophils Absolute 0.1 0 - 0.1 K/uL  Comprehensive metabolic panel     Status: Abnormal   Collection Time: 01/01/15  9:56 PM  Result Value Ref Range   Sodium 137 135 - 145 mmol/L   Potassium 3.5 3.5 - 5.1 mmol/L   Chloride 102 101 - 111 mmol/L   CO2 21 (L) 22 - 32 mmol/L   Glucose, Bld 102 (H) 65 - 99 mg/dL   BUN 7 6 - 20 mg/dL   Creatinine, Ser 0.91 0.61 - 1.24 mg/dL   Calcium 9.1 8.9 - 10.3 mg/dL   Total Protein 7.4 6.5 - 8.1 g/dL   Albumin 4.4 3.5 - 5.0 g/dL   AST 39 15 - 41 U/L   ALT 34 17 - 63 U/L   Alkaline Phosphatase 69 38 - 126 U/L   Total Bilirubin 0.5 0.3 - 1.2 mg/dL   GFR calc non Af Amer >60 >60 mL/min   GFR calc Af Amer >60 >60 mL/min    Comment: (NOTE) The eGFR has been calculated using the CKD EPI equation. This calculation has not been validated in all clinical situations. eGFR's persistently <60 mL/min signify possible Chronic Kidney Disease.    Anion gap 14 5 - 15  Ethanol     Status: Abnormal   Collection Time: 01/01/15  9:56 PM  Result Value Ref Range   Alcohol, Ethyl (B) 265 (H) <5 mg/dL    Comment:        LOWEST DETECTABLE LIMIT FOR SERUM ALCOHOL IS 5 mg/dL FOR MEDICAL PURPOSES ONLY   Acetaminophen level     Status: Abnormal   Collection Time: 01/01/15  9:56 PM  Result Value Ref Range   Acetaminophen (Tylenol), Serum <10 (L) 10 - 30 ug/mL    Comment:        THERAPEUTIC CONCENTRATIONS VARY SIGNIFICANTLY. A RANGE OF 10-30 ug/mL MAY BE AN  EFFECTIVE CONCENTRATION FOR MANY PATIENTS. HOWEVER, SOME ARE BEST TREATED AT CONCENTRATIONS OUTSIDE THIS RANGE. ACETAMINOPHEN CONCENTRATIONS >150 ug/mL AT 4 HOURS AFTER INGESTION AND >50 ug/mL AT 12 HOURS AFTER INGESTION ARE OFTEN ASSOCIATED WITH TOXIC REACTIONS.   Salicylate level     Status: None   Collection Time: 01/01/15  9:56 PM  Result Value Ref Range   Salicylate Lvl 9.8 2.8 - 30.0 mg/dL    Vitals: Blood pressure 116/83, pulse 87, temperature 97.7 F (36.5 C), temperature source Oral, resp. rate 18, height '5\' 10"'  (1.778 m), weight 92.987 kg (205 lb), SpO2 97 %.  Risk to Self: Suicidal Ideation: Yes-Currently Present Suicidal Intent: Yes-Currently Present Is patient at risk for suicide?: Yes Suicidal Plan?: Yes-Currently Present Specify Current Suicidal Plan: Plan to cut throat Access to Means: Yes Specify Access to Suicidal Means: Patient has knives and bb gun. What has been your use of drugs/alcohol within the last 12 months?: Beer, 12 pack, every day How many times?: 3 Triggers for Past Attempts: Unpredictable Intentional Self Injurious Behavior: Cutting Comment - Self Injurious Behavior: Cuts open wound in neck Risk to Others: Homicidal Ideation: No Thoughts of Harm to Others: No Current Homicidal Intent: No Current Homicidal Plan:  No Access to Homicidal Means: No History of harm to others?: No Assessment of Violence: None Noted Does patient have access to weapons?: Yes (Comment) (Has knives and bb gun) Criminal Charges Pending?: No Does patient have a court date: No Prior Inpatient Therapy: Prior Inpatient Therapy: Yes Prior Therapy Dates: unknown Prior Therapy Facilty/Provider(s): [facility] Reason for Treatment: detox Prior Outpatient Therapy: Prior Outpatient Therapy: No Does patient have an ACCT team?: No Does patient have Intensive In-House Services?  : No Does patient have [facility] services? : No Does patient have [facility] services?: No  No current  facility-administered medications for this encounter.   Current Outpatient Prescriptions  Medication Sig Dispense Refill  . citalopram (CELEXA) 40 MG tablet Take 40 mg by mouth every morning.    . disulfiram (ANTABUSE) 250 MG tablet Take 250 mg by mouth every morning.    . hydrOXYzine (VISTARIL) 25 MG capsule Take 25 mg by mouth 3 (three) times daily.    Marland Kitchen loratadine (CLARITIN) 10 MG tablet Take 10 mg by mouth every morning.    . traMADol (ULTRAM) 50 MG tablet Take 2 tablets by mouth 3 (three) times daily as needed for moderate pain.     . traZODone (DESYREL) 150 MG tablet Take 150 mg by mouth at bedtime.      Musculoskeletal: Strength & Muscle Tone: within normal limits Gait & Station: normal Patient leans: N/A  Psychiatric Specialty Exam: Physical Exam  Constitutional: He appears well-developed and well-nourished.  HENT:  Head: Normocephalic and atraumatic.  Eyes: Conjunctivae are normal. Pupils are equal, round, and reactive to light.  Neck: Normal range of motion.  Cardiovascular: Normal heart sounds.   Respiratory: Effort normal.  GI: Soft.  Musculoskeletal: Normal range of motion.  Neurological: He is alert.  Skin: Skin is warm and dry.     Psychiatric: His speech is normal. Thought content normal. His affect is blunt. He is slowed. Cognition and memory are impaired. He expresses impulsivity and inappropriate judgment. He exhibits abnormal recent memory.    Review of Systems  Constitutional: Negative.   HENT: Negative.   Eyes: Negative.   Respiratory: Negative.   Cardiovascular: Negative.   Gastrointestinal: Negative.   Musculoskeletal: Negative.   Skin: Negative.   Neurological: Negative.   Psychiatric/Behavioral: Positive for memory loss and substance abuse. Negative for depression, suicidal ideas and hallucinations. The patient is not nervous/anxious and does not have insomnia.     Blood pressure 116/83, pulse 87, temperature 97.7 F (36.5 C), temperature  source Oral, resp. rate 18, height '5\' 10"'  (1.778 m), weight 92.987 kg (205 lb), SpO2 97 %.Body mass index is 29.41 kg/(m^2).  General Appearance: Disheveled  Eye Contact::  Minimal  Speech:  Slow  Volume:  Decreased  Mood:  Anxious  Affect:  Depressed  Thought Process:  Linear  Orientation:  Full (Time, Place, and Person)  Thought Content:  Negative  Suicidal Thoughts:  Yes.  without intent/plan  Homicidal Thoughts:  No  Memory:  Immediate;   Good Recent;   Fair Remote;   Poor  Judgement:  Impaired  Insight:  Shallow  Psychomotor Activity:  Decreased  Concentration:  Fair  Recall:  Poor  Fund of Knowledge:Fair  Language: Fair  Akathisia:  No  Handed:  Right  AIMS (if indicated):     Assets:  Communication Skills Housing Social Support  ADL's:  Intact  Cognition: Impaired,  Mild  Sleep:      Medical Decision Making: Review of Psycho-Social Stressors (1), Order AIMS  Test (2), Review or order medicine tests (1), Review of Medication Regimen & Side Effects (2) and Review of New Medication or Change in Dosage (2)  Treatment Plan Summary: Plan This patient is no longer intoxicated although he presented to the emergency room with a blood alcohol level in the mid to 200s. He does not have a history of seizures or DTs. Nevertheless he appears to be very high risk for self injury. This is at least the second time that he has cut himself on the neck while intoxicated. History would suggest that he has a very high likelihood of continuing to act out and potential suicidal behavior again. I do not feel very comfortable with discharging him, on the other hand I think when he mainly needs is substance abuse treatment. I will continue the IVC for now. We are going to look into whether we can send him to the alcohol and drug abuse treatment center or possibly the [place] or other inpatient substance abuse facility. Continue reevaluation over the weekend  Plan:  Recommend psychiatric Inpatient  admission when medically cleared. Discussed crisis plan, support from social network, calling 911, coming to the Emergency Department, and calling Suicide Hotline. Disposition: Continue IVC while we work on possible avenues for admission  Alethia Berthold 01/02/2015 4:42 PM

## 2015-01-02 NOTE — ED Notes (Signed)
ED BHU [place] Is the patient under IVC or is there intent for IVC: Yes.   Is the patient medically cleared: Yes.   Is there vacancy in the ED BHU: Yes.   Is the population mix appropriate for patient: Yes.   Is the patient awaiting placement in inpatient or outpatient setting: Yes.  beh med admission  Has the patient had a psychiatric consult: Yes.   Survey of unit performed for contraband, proper placement and condition of furniture, tampering with fixtures in bathroom, shower, and each patient room: Yes.  ; Findings:  APPEARANCE/BEHAVIOR cooperative NEURO ASSESSMENT Orientation: aao x 3 Hallucinations: No.None noted (Hallucinations) Speech: Normal Gait: normal RESPIRATORY ASSESSMENT wnl CARDIOVASCULAR ASSESSMENT wnl GASTROINTESTINAL ASSESSMENT wnl EXTREMITIES Moves all extremities PLAN OF CARE Provide calm/safe environment. Vital signs assessed twice daily. ED BHU Assessment once each 12-hour shift. Collaborate with intake RN daily or as condition indicates. Assure the ED provider has rounded once each shift. Provide and encourage hygiene. Provide redirection as needed. Assess for escalating behavior; address immediately and inform ED provider.  Assess family dynamic and appropriateness for visitation as needed: Yes.  ; If necessary, describe findings:  Educate the patient/family about BHU procedures/visitation: Yes.  ; If necessary, describe findings:

## 2015-01-02 NOTE — ED Notes (Signed)
BEHAVIORAL HEALTH ROUNDING Patient sleeping: Yes.   Patient alert and oriented: yes Behavior appropriate: Yes.  ; If no, describe:  Nutrition and fluids offered: Yes  Toileting and hygiene offered: Yes  Sitter present: not applicable Law enforcement present: Yes  

## 2015-01-02 NOTE — BH Assessment (Signed)
Assessment Note  Jesse Obrien is an 54 y.o. male who presented to the ED with his wife after suicide attempt involving cutting his throat with a knife.  Patient's wife, April Campas, reported that he had a previous suicide attempt in May.  Ms. Michiels reported that her  husband was recently released from [facility] and quit taking his medications "about two weeks ago".  Ms. Hur stated that her husband consumers on average a 12 pack case of beer on a daily basis.  BAC is 265  Axis I: Alcohol Abuse and Major Depression, Recurrent severe Axis II: Deferred Axis III: No past medical history on file. Axis IV: problems related to social environment Axis V: 41-50 serious symptoms  Past Medical History: No past medical history on file.  No past surgical history on file.  Family History: No family history on file.  Social History:  reports that he has been smoking.  He does not have any smokeless tobacco history on file. He reports that he drinks alcohol. His drug history is not on file.  Additional Social History:  Alcohol / Drug Use History of alcohol / drug use?: Yes Longest period of sobriety (when/how long): unknown Negative Consequences of Use: Personal relationships, Legal Withdrawal Symptoms: Agitation, Aggressive/Assaultive, Irritability Substance #1 Name of Substance 1: Beer 1 - Age of First Use: unsure 1 - Amount (size/oz): 24 oz 1 - Frequency: every day 1 - Duration: all day 1 - Last Use / Amount: yesterday  CIWA: CIWA-Ar BP: (!) 170/121 mmHg Pulse Rate: (!) 124 COWS:    Allergies: Not on File  Home Medications:  (Not in a hospital admission)  OB/GYN Status:  No LMP for male patient.  General Assessment Data Location of Assessment: [facility] ED TTS Assessment: In system Is this a Tele or Face-to-Face Assessment?: Face-to-Face Is this an Initial Assessment or a Re-assessment for this encounter?: Initial Assessment Marital status: Married Is patient pregnant?:  No Living Arrangements: Spouse/significant other (April Cavell) Can pt return to current living arrangement?: Yes Admission Status: Voluntary Is patient capable of signing voluntary admission?: Yes Referral Source: Self/Family/Friend Insurance type: [place] Screening Exam ([place]) Medical Exam completed: Yes  Crisis Care Plan Living Arrangements: Spouse/significant other (April Matsen)  Education Status Is patient currently in school?: No Highest grade of school patient has completed: 12th  Risk to self with the past 6 months Suicidal Ideation: Yes-Currently Present Has patient been a risk to self within the past 6 months prior to admission? : Yes Suicidal Intent: Yes-Currently Present Has patient had any suicidal intent within the past 6 months prior to admission? : Yes Is patient at risk for suicide?: Yes Suicidal Plan?: Yes-Currently Present Has patient had any suicidal plan within the past 6 months prior to admission? : Yes Specify Current Suicidal Plan: Plan to cut throat Access to Means: Yes Specify Access to Suicidal Means: Patient has knives and bb gun. What has been your use of drugs/alcohol within the last 12 months?: Beer, 12 pack, every day Previous Attempts/Gestures: Yes How many times?: 3 Triggers for Past Attempts: Unpredictable Intentional Self Injurious Behavior: Cutting Comment - Self Injurious Behavior: Cuts open wound in neck Family Suicide History: Unknown Recent stressful life event(s): Other (Comment) (Marital) Persecutory voices/beliefs?: No Depression: Yes Depression Symptoms: Insomnia, Despondent, Feeling worthless/self pity Substance abuse history and/or treatment for substance abuse?: Yes Suicide prevention information given to non-admitted patients: Not applicable  Risk to Others within the past 6 months Homicidal Ideation: No  Does patient have any lifetime risk of violence toward others beyond the six months prior to  admission? : No Thoughts of Harm to Others: No Current Homicidal Intent: No Current Homicidal Plan: No Access to Homicidal Means: No History of harm to others?: No Assessment of Violence: None Noted Does patient have access to weapons?: Yes (Comment) (Has knives and bb gun) Criminal Charges Pending?: No Does patient have a court date: No Is patient on probation?: No  Psychosis Hallucinations: None noted Delusions: None noted  Mental Status Report Appearance/Hygiene: In scrubs Eye Contact: Fair Motor Activity: Agitation, Unsteady Speech: Slurred Level of Consciousness: Restless, Combative Mood: Irritable, Sullen Affect: Angry, Sullen Anxiety Level: Minimal Thought Processes: Circumstantial Judgement: Impaired Orientation: Not oriented Obsessive Compulsive Thoughts/Behaviors: Minimal  Cognitive Functioning Concentration: Fair Memory: Recent Intact IQ: Average Insight: Fair Impulse Control: Poor Appetite: Fair Weight Loss: 5 Weight Gain: 0 Sleep: Decreased Total Hours of Sleep: 5 Vegetative Symptoms: None  ADLScreening [facility] Assessment Services) Patient's cognitive ability adequate to safely complete daily activities?: Yes Patient able to express need for assistance with ADLs?: Yes Independently performs ADLs?: Yes (appropriate for developmental age)  Prior Inpatient Therapy Prior Inpatient Therapy: Yes Prior Therapy Dates: unknown Prior Therapy Facilty/Provider(s): [facility] Reason for Treatment: detox  Prior Outpatient Therapy Prior Outpatient Therapy: No Does patient have an ACCT team?: No Does patient have Intensive In-House Services?  : No Does patient have [facility] services? : No Does patient have P4CC services?: No  ADL Screening (condition at time of admission) Patient's cognitive ability adequate to safely complete daily activities?: Yes Patient able to express need for assistance with ADLs?: Yes Independently performs ADLs?: Yes (appropriate for  developmental age)       Abuse/Neglect Assessment (Assessment to be complete while patient is alone) Physical Abuse: Denies Verbal Abuse: Denies Sexual Abuse: Denies Exploitation of patient/patient's resources: Denies Self-Neglect: Denies Values / Beliefs Cultural Requests During Hospitalization: None Spiritual Requests During Hospitalization: None Consults Spiritual Care Consult Needed: No Social Work Consult Needed: No      Additional Information 1:1 In Past 12 Months?: Yes CIRT Risk: Yes Elopement Risk: Yes Does patient have medical clearance?: Yes     Disposition:  Disposition Initial Assessment Completed for this Encounter: Yes Disposition of Patient: Referred to (Psych MD) Type of treatment offered and refused: Other (Comment) (Psych MD consult) Patient referred to: Other (Comment) (Psych MD consult)  On Site Evaluation by:   Reviewed with Physician:    Oneita Hurt 01/02/2015 12:55 AM

## 2015-01-02 NOTE — ED Notes (Signed)
BEHAVIORAL HEALTH ROUNDING Patient sleeping: Yes.   Patient alert and oriented: asleep Behavior appropriate: Yes.  ; If no, describe:  Nutrition and fluids offered: asleep Toileting and hygiene offered: asleep Sitter present: no Law enforcement present: Yes, ODS 

## 2015-01-02 NOTE — ED Notes (Signed)
BEHAVIORAL HEALTH ROUNDING Patient sleeping: No. Patient alert and oriented: yes Behavior appropriate: Yes.  ; If no, describe:  Nutrition and fluids offered: Yes  Toileting and hygiene offered: Yes  Sitter present: not applicable Law enforcement present: Yes  

## 2015-01-02 NOTE — ED Notes (Signed)
Per report from first nurse Raquel D, RN. Pt's wife stated that pt drinks a 12 pack to a case of beer a day. CIWA scale done patient CIWA score at this time O.

## 2015-01-02 NOTE — ED Notes (Signed)
Pt sitting in day room watching tv.

## 2015-01-02 NOTE — ED Notes (Signed)

## 2015-01-02 NOTE — ED Notes (Signed)

## 2015-01-02 NOTE — ED Provider Notes (Signed)
-----------------------------------------   6:13 AM on 01/02/2015 -----------------------------------------   BP 170/121 mmHg  Pulse 124  Temp(Src) 98.6 F (37 C) (Oral)  Resp 16  Ht 5\' 10"  (1.778 m)  Wt 205 lb (92.987 kg)  BMI 29.41 kg/m2  SpO2 97%  The patient had no acute events since last update.  Calm and cooperative at this time.  Disposition is pending per Psychiatry/Behavioral Medicine team recommendations.     Paulette Blanch, MD 01/02/15 [phone]

## 2015-01-02 NOTE — ED Notes (Signed)
Pt reporting to ED Medic Ronalee Belts that he is getting the urge to pull open the wound on his neck that he created with a knife and razor prior to coming to ER. Discussed with Dr Jimmye Norman and Dr Jimmye Norman ordered 20 mg Geodon IM. Pt initially refused to allow me to give him the IM shot questioning what the medication was. I informed the pt that the medication was Geodon and would help him to calm down and help quiet his thoughts of wanting to pull on the wound in his neck. The pt then asked me "Do I have to take it?" to which I responded yes and reinforced with him that it would help him to calm down and relax. Pt then allowed me to administer the IM Geodon shot to his left deltoid. Pt was laying on stretcher watching TV when I left the room

## 2015-01-02 NOTE — ED Notes (Signed)
Pt provided with sandwich tray and drink.  

## 2015-01-02 NOTE — ED Notes (Signed)
Pt resting in bed with eyes closed. No unusual behavior observed. Pt has no needs or concerns at this time. Will continue to monitor and f/u as needed.  

## 2015-01-02 NOTE — ED Notes (Signed)
Pt c/o pain to self-inflicted laceration to neck that was dermabonded. MD notified. Medication ordered and administered.

## 2015-01-02 NOTE — ED Notes (Signed)
Pt provided with dinner tray and drink.  

## 2015-01-03 ENCOUNTER — Inpatient Hospital Stay
Admit: 2015-01-03 | Discharge: 2015-01-04 | DRG: 885 | Disposition: A | Discharge status: Home / Self Care | Attending: Psychiatry | Admitting: Psychiatry

## 2015-01-03 DIAGNOSIS — Z87891 Personal history of nicotine dependence: Secondary | ICD-10-CM

## 2015-01-03 DIAGNOSIS — F323 Major depressive disorder, single episode, severe with psychotic features: Secondary | ICD-10-CM | POA: Diagnosis present

## 2015-01-03 DIAGNOSIS — M549 Dorsalgia, unspecified: Secondary | ICD-10-CM | POA: Diagnosis present

## 2015-01-03 DIAGNOSIS — Y907 Blood alcohol level of 200-239 mg/100 ml: Secondary | ICD-10-CM | POA: Diagnosis present

## 2015-01-03 DIAGNOSIS — F322 Major depressive disorder, single episode, severe without psychotic features: Secondary | ICD-10-CM | POA: Diagnosis present

## 2015-01-03 DIAGNOSIS — Z79899 Other long term (current) drug therapy: Secondary | ICD-10-CM | POA: Diagnosis not present

## 2015-01-03 DIAGNOSIS — G8929 Other chronic pain: Secondary | ICD-10-CM | POA: Diagnosis present

## 2015-01-03 DIAGNOSIS — F419 Anxiety disorder, unspecified: Secondary | ICD-10-CM | POA: Diagnosis present

## 2015-01-03 DIAGNOSIS — G4700 Insomnia, unspecified: Secondary | ICD-10-CM | POA: Diagnosis present

## 2015-01-03 DIAGNOSIS — I10 Essential (primary) hypertension: Secondary | ICD-10-CM | POA: Diagnosis present

## 2015-01-03 DIAGNOSIS — R45851 Suicidal ideations: Secondary | ICD-10-CM | POA: Diagnosis present

## 2015-01-03 DIAGNOSIS — F1020 Alcohol dependence, uncomplicated: Secondary | ICD-10-CM | POA: Diagnosis present

## 2015-01-03 MED ORDER — CITALOPRAM HYDROBROMIDE 20 MG PO TABS
40.0000 mg | ORAL_TABLET | Freq: Every day | ORAL | Status: DC
  Administered 2015-01-03: 40 mg via ORAL
  Filled 2015-01-03 (×3): qty 2

## 2015-01-03 MED ORDER — ACETAMINOPHEN 325 MG PO TABS
650.0000 mg | ORAL_TABLET | Freq: Four times a day (QID) | ORAL | Status: DC | PRN
  Administered 2015-01-03: 650 mg via ORAL
  Filled 2015-01-03: qty 2

## 2015-01-03 MED ORDER — HYDROXYZINE HCL 50 MG PO TABS: 50.0000 mg | ORAL_TABLET | Freq: Four times a day (QID) | ORAL | Status: DC | PRN

## 2015-01-03 MED ORDER — ALUM & MAG HYDROXIDE-SIMETH 200-200-20 MG/5ML PO SUSP: 30.0000 mL | ORAL | Status: DC | PRN

## 2015-01-03 MED ORDER — TRAZODONE HCL 100 MG PO TABS
100.0000 mg | ORAL_TABLET | Freq: Every evening | ORAL | Status: DC | PRN
  Administered 2015-01-03: 100 mg via ORAL
  Filled 2015-01-03: qty 1

## 2015-01-03 MED ORDER — MAGNESIUM HYDROXIDE 400 MG/5ML PO SUSP: 30.0000 mL | Freq: Every day | ORAL | Status: DC | PRN

## 2015-01-03 NOTE — Plan of Care (Signed)
Problem: Alteration in mood & ability to function due to Goal: LTG-Pt reports reduction in suicidal thoughts (Patient reports reduction in suicidal thoughts and is able to verbalize a safety plan for whenever patient is feeling suicidal)  Outcome: Progressing Denies

## 2015-01-03 NOTE — Progress Notes (Signed)
54 yr old male admitted for suicidal ideation and substance abuse under services of Dr Bary Leriche, upon arrival patient was searched for contraband non found, skin check was done, patient has a deep laceration/ cut self inflicted to his right side of neck , dermabond was applied no drainage noted. Patient is alert and oriented x 4, blunted affect, appears irritable and  guarded with information, expressed anger about his admission stated "l  was only here for stiches". Patient currently denies SI/HIAVH but still contracts for safety, oriented to unit, 15 minutes checks maintained will continue to monitor.

## 2015-01-03 NOTE — Progress Notes (Signed)
Patient denies suicidal & homicidal ideation.Rated his depression 4/10.Getting more brighter than this morning.No withdrawal symptoms verbalized.Stated that his back pain is the reason for his depression.Compliant with meds.Attended groups.

## 2015-01-03 NOTE — ED Notes (Signed)
Pt resting in bed with eyes closed. No unusual behavior observed. Pt has no needs or concerns at this time. Will continue to monitor and f/u as needed.  

## 2015-01-03 NOTE — ED Notes (Signed)
BEHAVIORAL HEALTH ROUNDING Patient sleeping: Yes.   Patient alert and oriented: not applicable Behavior appropriate: Yes.    Nutrition and fluids offered: No Toileting and hygiene offered: No Sitter present: q15 minute observations and security camera monitoring Law enforcement present: Yes [employer] 

## 2015-01-03 NOTE — ED Provider Notes (Signed)
-----------------------------------------   1:18 AM on 01/03/2015 -----------------------------------------  Patient has been admitted to the behavioral health unit.  Paulette Blanch, MD 01/03/15 [phone]

## 2015-01-03 NOTE — BHH Suicide Risk Assessment (Signed)
[facility] Admission Suicide Risk Assessment   Nursing information obtained from:    Demographic factors:    Current Mental Status:    Loss Factors:    Historical Factors:    Risk Reduction Factors:    Total Time spent with patient: 1 hour Principal Problem: Major depressive disorder, single episode, severe Diagnosis:   Patient Active Problem List   Diagnosis Date Noted  . Major depressive disorder, single episode, severe [F32.2] 01/03/2015  . Alcohol dependence [F10.20] 01/02/2015  . Laceration [T14.8] 01/02/2015  . Hypertension [I10] 01/02/2015     Continued Clinical Symptoms:  Alcohol Use Disorder Identification Test Final Score (AUDIT): 30 The "Alcohol Use Disorders Identification Test", Guidelines for Use in Primary Care, Second Edition.  World Pharmacologist [facility]). Score between 0-7:  no or low risk or alcohol related problems. Score between 8-15:  moderate risk of alcohol related problems. Score between 16-19:  high risk of alcohol related problems. Score 20 or above:  warrants further diagnostic evaluation for alcohol dependence and treatment.   CLINICAL FACTORS:   Depression:   Severe Alcohol/Substance Abuse/Dependencies   Musculoskeletal: Strength & Muscle Tone: within normal limits Gait & Station: normal Patient leans: N/A  Psychiatric Specialty Exam: Physical Exam  Nursing note and vitals reviewed.   Review of Systems  Musculoskeletal: Positive for back pain.  All other systems reviewed and are negative.   Blood pressure 152/107, pulse 70, temperature 97.8 F (36.6 C), temperature source Oral, resp. rate 20, height 5\' 11"  (1.803 m), weight 92.534 kg (204 lb), SpO2 99 %.Body mass index is 28.46 kg/(m^2).  General Appearance: Disheveled  Eye Sport and exercise psychologist::  Fair  Speech:  Clear and Coherent  Volume:  Normal  Mood:  Depressed  Affect:  Constricted  Thought Process:  Goal Directed  Orientation:  Full (Time, Place, and Person)  Thought Content:  WDL   Suicidal Thoughts:  No  Homicidal Thoughts:  No  Memory:  Immediate;   Fair Recent;   Fair Remote;   Fair  Judgement:  Impaired  Insight:  Lacking  Psychomotor Activity:  Decreased  Concentration:  Fair  Recall:  [place]: Fair  Akathisia:  No  Handed:  Right  AIMS (if indicated):     Assets:  Communication Skills Desire for Improvement Financial Resources/Insurance Housing Intimacy Physical Health Resilience Social Support  Sleep:     Cognition: WNL  ADL's:  Intact     COGNITIVE FEATURES THAT CONTRIBUTE TO RISK:  None    SUICIDE RISK:   Moderate:  Frequent suicidal ideation with limited intensity, and duration, some specificity in terms of plans, no associated intent, good self-control, limited dysphoria/symptomatology, some risk factors present, and identifiable protective factors, including available and accessible social support.  PLAN OF CARE: Hospital admission, alcohol detox, medication management, discharge planning.  Medical Decision Making:  New problem, with additional work up planned, Review of Psycho-Social Stressors (1), Review or order clinical lab tests (1), Review of Medication Regimen & Side Effects (2) and Review of New Medication or Change in Dosage (2)   Jesse Obrien is a 54 year old male with a history of alcoholism admitted after a suicide attempt by cutting his neck while drunk.  1. Suicidal ideation. The patient is able to contract for safety in the hospital.  2. Depression. He has never been compliant with medications in the community. We will continue Celexa 40 mg daily.   3. Insomnia. He is on trazodone.  4. Anxiety. He is on Atarax.  5. Alcohol detox. Admitting psychiatrist did not initiate Cipro protocol. Blood alcohol level on admission 225. The patient reportedly has been drinking a 12 pack a day. Will monitor for symptoms of alcohol withdrawal.   7. Substance abuse treatment. He was just discharged from  a residential program. Did not maintain any sobriety. He is not currently interested in residential treatment.  8. History of hypertension. We'll monitor her vital signs  9. Disposition. He will be discharged to home with his wife. We do recommend intensive outpatient substance abuse treatment program participation    I certify that inpatient services furnished can reasonably be expected to improve the patient's condition.   Jesse Obrien 01/03/2015, 1:51 PM

## 2015-01-03 NOTE — Tx Team (Signed)
Initial Interdisciplinary Treatment Plan   PATIENT STRESSORS: Health problems Medication change or noncompliance Substance abuse   PATIENT STRENGTHS: Ability for insight Communication skills Supportive family/friends   PROBLEM LIST: Problem List/Patient Goals Date to be addressed Date deferred Reason deferred Estimated date of resolution  Substance abuse.  01/03/15           Suicide ideation 01/03/15           Medication noncompliance  01/03/15                              DISCHARGE CRITERIA:  Improved stabilization in mood, thinking, and/or behavior Medical problems require only outpatient monitoring Motivation to continue treatment in a less acute level of care Verbal commitment to aftercare and medication compliance  PRELIMINARY DISCHARGE PLAN: Attend PHP/IOP Attend 12-step recovery group  PATIENT/FAMIILY INVOLVEMENT: This treatment plan has been presented to and reviewed with the patient, Jesse Obrien, The patient and family have been given the opportunity to ask questions and make suggestions.  Bay Jarquin [name] Pinder 01/03/2015, 7:17 AM

## 2015-01-03 NOTE — ED Notes (Signed)
BEHAVIORAL HEALTH ROUNDING Patient sleeping: Yes.   Patient alert and oriented: asleep Behavior appropriate: Yes.  ; If no, describe:  Nutrition and fluids offered: asleep Toileting and hygiene offered: asleep Sitter present: no Law enforcement present: yes, ODS

## 2015-01-03 NOTE — Plan of Care (Signed)
Problem: Diagnosis: Increased Risk For Suicide Attempt Goal: LTG-Patient Will Report Improved Mood and Deny Suicidal LTG (by discharge) Patient will report improved mood and deny suicidal ideation.  Outcome: Progressing Patient denies SI/HI, mood is flat but brightens upon approach.

## 2015-01-03 NOTE — ED Notes (Signed)
BEHAVIORAL HEALTH ROUNDING Patient sleeping: Yes.   Patient alert and oriented: asleep Behavior appropriate: Yes.  ; If no, describe:  Nutrition and fluids offered: asleep Toileting and hygiene offered: asleep Sitter present: no Law enforcement present: Yes, ODS 

## 2015-01-03 NOTE — Plan of Care (Signed)
Problem: Diagnosis: Increased Risk For Suicide Attempt Goal: LTG-Patient Will Report Improved Mood and Deny Suicidal LTG (by discharge) Patient will report improved mood and deny suicidal ideation.  Outcome: Progressing Patient denies suicidal ideation.

## 2015-01-03 NOTE — BHH Group Notes (Signed)
[place] LCSW Group Therapy  01/03/2015 3:23 PM  Type of Therapy:  Group Therapy  Participation Level:  Minimal  Participation Quality:  Attentive  Affect:  Flat  Cognitive:  Alert  Insight:  Limited  Engagement in Therapy:  Limited  Modes of Intervention:  Discussion, Education, Socialization and Support  Summary of Progress/Problems:Patients were encouraged to define what community means to them. They were encouraged to identify formal and informal support within their community. Jesse Obrien attended group and stayed the entire time. He sat quietly and listened to others.     Wray Kearns, MSW, LCSWA  01/03/2015, 3:23 PM

## 2015-01-03 NOTE — Progress Notes (Signed)
   01/03/15 1000  Clinical Encounter Type  Visited With Patient  Referral From Nurse  Consult/Referral To Nurse  Patient appeared asleep when I entered his room. The nurse told him that I was there to speak with him.  Pt declined to speak to a chaplain.  Said "I don't want to talk to a chaplain."  I thanked the nurse for her help and left the unit.  Ross [phone]

## 2015-01-03 NOTE — H&P (Signed)
Psychiatric Admission Assessment Adult  Patient Identification: Jesse Obrien MRN:  [id number] Date of Evaluation:  01/03/2015 Chief Complaint:  Alcohol dependence with alcohol induced mood do Principal Diagnosis: Major depressive disorder, single episode, severe Diagnosis:   Patient Active Problem List   Diagnosis Date Noted  . Major depressive disorder, single episode, severe [F32.2] 01/03/2015  . Alcohol dependence [F10.20] 01/02/2015  . Laceration [T14.8] 01/02/2015  . Hypertension [I10] 01/02/2015   History of Present Illness::   Identifying data. Jesse Obrien is a 54 year old male with history of alcoholism.  Chief complaint. "I got drunk."  History of present illness. Information was obtained from the patient and the chart. Jesse Obrien is well known to [place] he has been admitted numerous times to our hospital for alcohol detox. He has been drinking more heavily for the past 2 or 3 weeks, reportedly 12 pack a day. While drunk at the patient cut his neck requiring stitches. He has been coming to the emergency room repeatedly in the past few days on the June 19 then 26 then 30.Marland Kitchen He was initially sent home but after a suicide attempt decision was made to admit him to psychiatry. The patient denies any symptoms of depression or psychosis. He denies other than alcohol substance use. He endorses severe anxiety as he usually does. The patient has a history of chronic back pain for which in the past he was prescribed narcotic painkillers that I no longer see them on the list of his medications.  Past psychiatric history. There were multiple hospitalizations at [facility] as well as other hospitals for alcoholism. The patient was discharged a few weeks ago from another hospital following an alcohol treatment but was not able to maintain any sobriety. He considers himself an episodic drinker. He had a several suicide attempts by overdose and now neck cutting. The patient reports  that he gets suicidal when drinking. He has been numerous times in alcohol rehabilitation.  Family psychiatric history. Multiple family members with alcoholism.  Past medical history. Chronic back pain, history of hypertension.  Social history he is retired Scientist, research (life sciences). He does not work with the [place] system. He is married and lives with his wife.  Total Time spent with patient: 1 hour  Past Medical History:  Past Medical History  Diagnosis Date  . Hypertension 01/02/2015   History reviewed. No pertinent past surgical history. Family History: History reviewed. No pertinent family history. Social History:  History  Alcohol Use  . Yes     History  Drug Use No    History   Social History  . Marital Status: Married    Spouse Name: N/A  . Number of Children: N/A  . Years of Education: N/A   Social History Main Topics  . Smoking status: Former Smoker -- 15 years  . Smokeless tobacco: Not on file  . Alcohol Use: Yes  . Drug Use: No  . Sexual Activity: Yes    Birth Control/ Protection: Condom   Other Topics Concern  . None   Social History Narrative   Additional Social History:    History of alcohol / drug use?: Yes Negative Consequences of Use: Personal relationships, Legal Name of Substance 1: Beer 1 - Age of First Use: unsure 1 - Amount (size/oz): 24 oz 1 - Frequency: every day 1 - Duration: all day 1 - Last Use / Amount: yesterday  Musculoskeletal: Strength & Muscle Tone: within normal limits Gait & Station: normal Patient leans: N/A  Psychiatric Specialty Exam: Physical Exam  Nursing note and vitals reviewed.   Review of Systems  Musculoskeletal: Positive for back pain.  All other systems reviewed and are negative.   Blood pressure 152/107, pulse 70, temperature 97.8 F (36.6 C), temperature source Oral, resp. rate 20, height _0  (1.803 m), weight 92.534 kg (204 lb), SpO2 99 %.Body mass index is 28.46 kg/(m^2).  See SRA.                                                   Sleep:      Risk to Self: Is patient at risk for suicide?: Yes Risk to Others:   Prior Inpatient Therapy:   Prior Outpatient Therapy:    Alcohol Screening: 1. How often do you have a drink containing alcohol?: 4 or more times a week 2. How many drinks containing alcohol do you have on a typical day when you are drinking?: 10 or more 3. How often do you have six or more drinks on one occasion?: Weekly Preliminary Score: 7 4. How often during the last year have you found that you were not able to stop drinking once you had started?: Weekly 5. How often during the last year have you failed to do what was normally expected from you becasue of drinking?: Weekly 6. How often during the last year have you needed a first drink in the morning to get yourself going after a heavy drinking session?: Weekly 7. How often during the last year have you had a feeling of guilt of remorse after drinking?: Weekly 8. How often during the last year have you been unable to remember what happened the night before because you had been drinking?: Weekly 9. Have you or someone else been injured as a result of your drinking?: Yes, but not in the last year 10. Has a relative or friend or a doctor or another health worker been concerned about your drinking or suggested you cut down?: Yes, but not in the last year Alcohol Use Disorder Identification Test Final Score (AUDIT): 30 Brief Intervention: Yes  Allergies:  No Known Allergies Lab Results:  Results for orders placed or performed during the hospital encounter of 01/01/15 (from the past 48 hour(s))  Urine Drug Screen, Qualitative ([place] only)     Status: None   Collection Time: 01/01/15  9:55 PM  Result Value Ref Range   Tricyclic, Ur Screen NONE DETECTED NONE DETECTED   Amphetamines, Ur Screen NONE DETECTED NONE DETECTED   MDMA (Ecstasy)Ur Screen NONE DETECTED NONE DETECTED   Cocaine  Metabolite,Ur [place] NONE DETECTED NONE DETECTED   Opiate, Ur Screen NONE DETECTED NONE DETECTED   Phencyclidine (PCP) Ur S NONE DETECTED NONE DETECTED   Cannabinoid 50 Ng, Ur [place] NONE DETECTED NONE DETECTED   Barbiturates, Ur Screen NONE DETECTED NONE DETECTED   Benzodiazepine, Ur Scrn NONE DETECTED NONE DETECTED   Methadone Scn, Ur NONE DETECTED NONE DETECTED    Comment: (NOTE) 751  Tricyclics, urine               Cutoff 1000 ng/mL 200  Amphetamines, urine             Cutoff 1000 ng/mL 300  MDMA (Ecstasy), urine  Cutoff 500 ng/mL 400  Cocaine Metabolite, urine       Cutoff 300 ng/mL 500  Opiate, urine                   Cutoff 300 ng/mL 600  Phencyclidine (PCP), urine      Cutoff 25 ng/mL 700  Cannabinoid, urine              Cutoff 50 ng/mL 800  Barbiturates, urine             Cutoff 200 ng/mL 900  Benzodiazepine, urine           Cutoff 200 ng/mL 1000 Methadone, urine                Cutoff 300 ng/mL 1100 1200 The urine drug screen provides only a preliminary, unconfirmed 1300 analytical test result and should not be used for non-medical 1400 purposes. Clinical consideration and professional judgment should 1500 be applied to any positive drug screen result due to possible 1600 interfering substances. A more specific alternate chemical method 1700 must be used in order to obtain a confirmed analytical result.  1800 Gas chromato graphy / mass spectrometry (GC/MS) is the preferred 1900 confirmatory method.   CBC with Differential/Platelet     Status: Abnormal   Collection Time: 01/01/15  9:56 PM  Result Value Ref Range   WBC 11.7 (H) 3.8 - 10.6 K/uL   RBC 4.46 4.40 - 5.90 MIL/uL   Hemoglobin 14.5 13.0 - 18.0 g/dL   HCT 43.0 40.0 - 52.0 %   MCV 96.4 80.0 - 100.0 fL   MCH 32.4 26.0 - 34.0 pg   MCHC 33.6 32.0 - 36.0 g/dL   RDW 12.7 11.5 - 14.5 %   Platelets 170 150 - 440 K/uL   Neutrophils Relative % 59 %   Neutro Abs 6.9 (H) 1.4 - 6.5 K/uL   Lymphocytes Relative 31 %    Lymphs Abs 3.6 1.0 - 3.6 K/uL   Monocytes Relative 7 %   Monocytes Absolute 0.8 0.2 - 1.0 K/uL   Eosinophils Relative 2 %   Eosinophils Absolute 0.2 0 - 0.7 K/uL   Basophils Relative 1 %   Basophils Absolute 0.1 0 - 0.1 K/uL  Comprehensive metabolic panel     Status: Abnormal   Collection Time: 01/01/15  9:56 PM  Result Value Ref Range   Sodium 137 135 - 145 mmol/L   Potassium 3.5 3.5 - 5.1 mmol/L   Chloride 102 101 - 111 mmol/L   CO2 21 (L) 22 - 32 mmol/L   Glucose, Bld 102 (H) 65 - 99 mg/dL   BUN 7 6 - 20 mg/dL   Creatinine, Ser 0.91 0.61 - 1.24 mg/dL   Calcium 9.1 8.9 - 10.3 mg/dL   Total Protein 7.4 6.5 - 8.1 g/dL   Albumin 4.4 3.5 - 5.0 g/dL   AST 39 15 - 41 U/L   ALT 34 17 - 63 U/L   Alkaline Phosphatase 69 38 - 126 U/L   Total Bilirubin 0.5 0.3 - 1.2 mg/dL   GFR calc non Af Amer >60 >60 mL/min   GFR calc Af Amer >60 >60 mL/min    Comment: (NOTE) The eGFR has been calculated using the CKD EPI equation. This calculation has not been validated in all clinical situations. eGFR's persistently <60 mL/min signify possible Chronic Kidney Disease.    Anion gap 14 5 - 15  Ethanol     Status: Abnormal   Collection  Time: 01/01/15  9:56 PM  Result Value Ref Range   Alcohol, Ethyl (B) 265 (H) <5 mg/dL    Comment:        LOWEST DETECTABLE LIMIT FOR SERUM ALCOHOL IS 5 mg/dL FOR MEDICAL PURPOSES ONLY   Acetaminophen level     Status: Abnormal   Collection Time: 01/01/15  9:56 PM  Result Value Ref Range   Acetaminophen (Tylenol), Serum <10 (L) 10 - 30 ug/mL    Comment:        THERAPEUTIC CONCENTRATIONS VARY SIGNIFICANTLY. A RANGE OF 10-30 ug/mL MAY BE AN EFFECTIVE CONCENTRATION FOR MANY PATIENTS. HOWEVER, SOME ARE BEST TREATED AT CONCENTRATIONS OUTSIDE THIS RANGE. ACETAMINOPHEN CONCENTRATIONS >150 ug/mL AT 4 HOURS AFTER INGESTION AND >50 ug/mL AT 12 HOURS AFTER INGESTION ARE OFTEN ASSOCIATED WITH TOXIC REACTIONS.   Salicylate level     Status: None   Collection  Time: 01/01/15  9:56 PM  Result Value Ref Range   Salicylate Lvl 9.8 2.8 - 30.0 mg/dL   Current Medications: Current Facility-Administered Medications  Medication Dose Route Frequency Provider Last Rate Last Dose  . acetaminophen (TYLENOL) tablet 650 mg  650 mg Oral Q6H PRN Gonzella Lex, MD   650 mg at 01/03/15 0921  . alum & mag hydroxide-simeth (MAALOX/MYLANTA) 200-200-20 MG/5ML suspension 30 mL  30 mL Oral Q4H PRN Gonzella Lex, MD      . citalopram (CELEXA) tablet 40 mg  40 mg Oral Daily Gonzella Lex, MD   40 mg at 01/03/15 9735  . hydrOXYzine (ATARAX/VISTARIL) tablet 50 mg  50 mg Oral Q6H PRN Gonzella Lex, MD      . magnesium hydroxide (MILK OF MAGNESIA) suspension 30 mL  30 mL Oral Daily PRN Gonzella Lex, MD      . traZODone (DESYREL) tablet 100 mg  100 mg Oral QHS PRN Gonzella Lex, MD       PTA Medications: Prescriptions prior to admission  Medication Sig Dispense Refill Last Dose  . citalopram (CELEXA) 40 MG tablet Take 40 mg by mouth every morning.   Past Month at Unknown time  . traMADol (ULTRAM) 50 MG tablet Take 2 tablets by mouth 3 (three) times daily as needed for moderate pain.    Past Month at Unknown time  . traZODone (DESYREL) 150 MG tablet Take 150 mg by mouth at bedtime.   Past Month at Unknown time  . disulfiram (ANTABUSE) 250 MG tablet Take 250 mg by mouth every morning.   Not Taking at Unknown time  . hydrOXYzine (VISTARIL) 25 MG capsule Take 25 mg by mouth 3 (three) times daily.   Not Taking at Unknown time  . loratadine (CLARITIN) 10 MG tablet Take 10 mg by mouth every morning.   Not Taking at Unknown time    Previous Psychotropic Medications: Yes   Substance Abuse History in the last 12 months:  Yes.      Consequences of Substance Abuse: Negative  Results for orders placed or performed during the hospital encounter of 01/01/15 (from the past 72 hour(s))  Urine Drug Screen, Qualitative ([place] only)     Status: None   Collection Time: 01/01/15   9:55 PM  Result Value Ref Range   Tricyclic, Ur Screen NONE DETECTED NONE DETECTED   Amphetamines, Ur Screen NONE DETECTED NONE DETECTED   MDMA (Ecstasy)Ur Screen NONE DETECTED NONE DETECTED   Cocaine Metabolite,Ur [place] NONE DETECTED NONE DETECTED   Opiate, Ur Screen NONE DETECTED NONE DETECTED   Phencyclidine (  PCP) Ur S NONE DETECTED NONE DETECTED   Cannabinoid 50 Ng, Ur [place] NONE DETECTED NONE DETECTED   Barbiturates, Ur Screen NONE DETECTED NONE DETECTED   Benzodiazepine, Ur Scrn NONE DETECTED NONE DETECTED   Methadone Scn, Ur NONE DETECTED NONE DETECTED    Comment: (NOTE) 268  Tricyclics, urine               Cutoff 1000 ng/mL 200  Amphetamines, urine             Cutoff 1000 ng/mL 300  MDMA (Ecstasy), urine           Cutoff 500 ng/mL 400  Cocaine Metabolite, urine       Cutoff 300 ng/mL 500  Opiate, urine                   Cutoff 300 ng/mL 600  Phencyclidine (PCP), urine      Cutoff 25 ng/mL 700  Cannabinoid, urine              Cutoff 50 ng/mL 800  Barbiturates, urine             Cutoff 200 ng/mL 900  Benzodiazepine, urine           Cutoff 200 ng/mL 1000 Methadone, urine                Cutoff 300 ng/mL 1100 1200 The urine drug screen provides only a preliminary, unconfirmed 1300 analytical test result and should not be used for non-medical 1400 purposes. Clinical consideration and professional judgment should 1500 be applied to any positive drug screen result due to possible 1600 interfering substances. A more specific alternate chemical method 1700 must be used in order to obtain a confirmed analytical result.  1800 Gas chromato graphy / mass spectrometry (GC/MS) is the preferred 1900 confirmatory method.   CBC with Differential/Platelet     Status: Abnormal   Collection Time: 01/01/15  9:56 PM  Result Value Ref Range   WBC 11.7 (H) 3.8 - 10.6 K/uL   RBC 4.46 4.40 - 5.90 MIL/uL   Hemoglobin 14.5 13.0 - 18.0 g/dL   HCT 43.0 40.0 - 52.0 %   MCV 96.4 80.0 - 100.0 fL   MCH 32.4  26.0 - 34.0 pg   MCHC 33.6 32.0 - 36.0 g/dL   RDW 12.7 11.5 - 14.5 %   Platelets 170 150 - 440 K/uL   Neutrophils Relative % 59 %   Neutro Abs 6.9 (H) 1.4 - 6.5 K/uL   Lymphocytes Relative 31 %   Lymphs Abs 3.6 1.0 - 3.6 K/uL   Monocytes Relative 7 %   Monocytes Absolute 0.8 0.2 - 1.0 K/uL   Eosinophils Relative 2 %   Eosinophils Absolute 0.2 0 - 0.7 K/uL   Basophils Relative 1 %   Basophils Absolute 0.1 0 - 0.1 K/uL  Comprehensive metabolic panel     Status: Abnormal   Collection Time: 01/01/15  9:56 PM  Result Value Ref Range   Sodium 137 135 - 145 mmol/L   Potassium 3.5 3.5 - 5.1 mmol/L   Chloride 102 101 - 111 mmol/L   CO2 21 (L) 22 - 32 mmol/L   Glucose, Bld 102 (H) 65 - 99 mg/dL   BUN 7 6 - 20 mg/dL   Creatinine, Ser 0.91 0.61 - 1.24 mg/dL   Calcium 9.1 8.9 - 10.3 mg/dL   Total Protein 7.4 6.5 - 8.1 g/dL   Albumin 4.4 3.5 - 5.0 g/dL  AST 39 15 - 41 U/L   ALT 34 17 - 63 U/L   Alkaline Phosphatase 69 38 - 126 U/L   Total Bilirubin 0.5 0.3 - 1.2 mg/dL   GFR calc non Af Amer >60 >60 mL/min   GFR calc Af Amer >60 >60 mL/min    Comment: (NOTE) The eGFR has been calculated using the CKD EPI equation. This calculation has not been validated in all clinical situations. eGFR's persistently <60 mL/min signify possible Chronic Kidney Disease.    Anion gap 14 5 - 15  Ethanol     Status: Abnormal   Collection Time: 01/01/15  9:56 PM  Result Value Ref Range   Alcohol, Ethyl (B) 265 (H) <5 mg/dL    Comment:        LOWEST DETECTABLE LIMIT FOR SERUM ALCOHOL IS 5 mg/dL FOR MEDICAL PURPOSES ONLY   Acetaminophen level     Status: Abnormal   Collection Time: 01/01/15  9:56 PM  Result Value Ref Range   Acetaminophen (Tylenol), Serum <10 (L) 10 - 30 ug/mL    Comment:        THERAPEUTIC CONCENTRATIONS VARY SIGNIFICANTLY. A RANGE OF 10-30 ug/mL MAY BE AN EFFECTIVE CONCENTRATION FOR MANY PATIENTS. HOWEVER, SOME ARE BEST TREATED AT CONCENTRATIONS OUTSIDE  THIS RANGE. ACETAMINOPHEN CONCENTRATIONS >150 ug/mL AT 4 HOURS AFTER INGESTION AND >50 ug/mL AT 12 HOURS AFTER INGESTION ARE OFTEN ASSOCIATED WITH TOXIC REACTIONS.   Salicylate level     Status: None   Collection Time: 01/01/15  9:56 PM  Result Value Ref Range   Salicylate Lvl 9.8 2.8 - 30.0 mg/dL    Observation Level/Precautions:  15 minute checks  Laboratory:  CBC Chemistry Profile UDS UA  Psychotherapy:    Medications:    Consultations:    Discharge Concerns:    Estimated LOS:  Other:     Psychological Evaluations: No   Treatment Plan Summary: Daily contact with patient to assess and evaluate symptoms and progress in treatment and Medication management  Medical Decision Making:  New problem, with additional work up planned, Review of Psycho-Social Stressors (1), Review or order clinical lab tests (1), Review of Medication Regimen & Side Effects (2) and Review of New Medication or Change in Dosage (2)   Mr. Ryner is a 54 year old male with a history of alcoholism admitted after a suicide attempt by cutting his neck while drunk.  1. Suicidal ideation. The patient is able to contract for safety in the hospital.  2. Depression. He has never been compliant with medications in the community. We will continue Celexa 40 mg daily.   3. Insomnia. He is on trazodone.  4. Anxiety. He is on Atarax.   5. Alcohol detox. Admitting psychiatrist did not initiate Cipro protocol. Blood alcohol level on admission 225. The patient reportedly has been drinking a 12 pack a day. Will monitor for symptoms of alcohol withdrawal.   7. Substance abuse treatment. He was just discharged from a residential program. Did not maintain any sobriety. He is not currently interested in residential treatment.  8. History of hypertension. We'll monitor her vital signs  9. Disposition. He will be discharged to home with his wife. We do recommend intensive outpatient substance abuse treatment program  participation   I certify that inpatient services furnished can reasonably be expected to improve the patient's condition.   Addalynne Golding 7/2/20161:57 PM

## 2015-01-04 NOTE — BHH Suicide Risk Assessment (Signed)
[facility] INPATIENT:  Family/Significant Other Suicide Prevention Education  Suicide Prevention Education:  Education Completed; Kenneth Cuaresma [phone]) has been identified by the patient as the family member/significant other with whom the patient will be residing, and identified as the person(s) who will aid the patient in the event of a mental health crisis (suicidal ideations/suicide attempt).  With written consent from the patient, the family member/significant other has been provided the following suicide prevention education, prior to the and/or following the discharge of the patient.  The suicide prevention education provided includes the following:  Suicide risk factors  Suicide prevention and interventions  National Suicide Hotline telephone number  [facility] assessment telephone number  [facility] [address] and/or Residential Mobile Crisis Unit telephone number  Request made of family/significant other to:  Remove weapons (e.g., guns, rifles, knives), all items previously/currently identified as safety concern.    Remove drugs/medications (over-the-counter, prescriptions, illicit drugs), all items previously/currently identified as a safety concern.  The family member/significant other verbalizes understanding of the suicide prevention education information provided.  The family member/significant other agrees to remove the items of safety concern listed above.  Winfred MSW, LCSWA  01/04/2015, 11:44 AM

## 2015-01-04 NOTE — BHH Suicide Risk Assessment (Signed)
[facility] Discharge Suicide Risk Assessment   Demographic Factors:  Male and Caucasian  Total Time spent with patient: 30 minutes  Musculoskeletal: Strength & Muscle Tone: within normal limits Gait & Station: normal Patient leans: N/A  Psychiatric Specialty Exam: Physical Exam  Nursing note and vitals reviewed.   Review of Systems  Musculoskeletal: Positive for back pain.  All other systems reviewed and are negative.   Blood pressure 125/77, pulse 90, temperature 97.5 F (36.4 C), temperature source Oral, resp. rate 20, height 5\' 11"  (1.803 m), weight 92.534 kg (204 lb), SpO2 99 %.Body mass index is 28.46 kg/(m^2).  General Appearance: Casual  Eye Contact::  Good  Speech:  Clear and [id number]  Volume:  Normal  Mood:  Euthymic  Affect:  Appropriate  Thought Process:  Goal Directed  Orientation:  Full (Time, Place, and Person)  Thought Content:  WDL  Suicidal Thoughts:  No  Homicidal Thoughts:  No  Memory:  Immediate;   Fair Recent;   Fair Remote;   Fair  Judgement:  Fair  Insight:  Lacking  Psychomotor Activity:  Normal  Concentration:  Fair  Recall:  [employer] [place]  Language: Fair  Akathisia:  No  Handed:  Right  AIMS (if indicated):     Assets:  Communication Skills Desire for Improvement Financial Resources/Insurance Housing Intimacy Physical Health Social Support  Sleep:  Number of Hours: 6.25  Cognition: WNL  ADL's:  Intact   Have you used any form of tobacco in the last 30 days? (Cigarettes, Smokeless Tobacco, Cigars, and/or Pipes): No (FORMER SMOKER. )  Has this patient used any form of tobacco in the last 30 days? (Cigarettes, Smokeless Tobacco, Cigars, and/or Pipes) No  Mental Status Per Nursing Assessment::   On Admission:     Current Mental Status by Physician: NA  Loss Factors: NA  Historical Factors: Prior suicide attempts and Impulsivity  Risk Reduction Factors:   Sense of responsibility to family, Living with another  person, especially a relative and Positive social support  Continued Clinical Symptoms:  Depression:   Severe Alcohol/Substance Abuse/Dependencies  Cognitive Features That Contribute To Risk:  None    Suicide Risk:  Minimal: No identifiable suicidal ideation.  Patients presenting with no risk factors but with morbid ruminations; may be classified as minimal risk based on the severity of the depressive symptoms  Principal Problem: Major depressive disorder, single episode, severe with psychotic features Discharge Diagnoses:  Patient Active Problem List   Diagnosis Date Noted  . 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 Alcohol dependence [F10.20] 01/02/2015  . Laceration [T14.8] 01/02/2015  . Hypertension [I10] 01/02/2015    Follow-up Information    Schedule an appointment as soon as possible for a visit with [place] at [facility].   Why:  Scehdule appointment with Dr. Jasper Loser as soon as possible.    Contact information:   [address] [place], [place] [address] [place], [place] [address] Phone: [phone] Fax: [phone]      Plan Of Care/Follow-up recommendations:  Activity:  As tolerated. Diet:  Low sodium heart healthy. Other:  Keep follow-up appointments.  Is patient on multiple antipsychotic therapies at discharge:  No   Has Patient had three or more failed trials of antipsychotic monotherapy by history:  No  Recommended Plan for Multiple Antipsychotic Therapies: NA    Jesse Obrien 01/04/2015, 12:49 PM

## 2015-01-04 NOTE — Progress Notes (Signed)
Spent majority of shift isolated in room. Did come out during group time outdoors. Had an uneventful night.

## 2015-01-04 NOTE — BHH Group Notes (Signed)
[place] Group Notes:  (Nursing/MHT/Case Management/Adjunct)  Date:  01/04/2015  Time:  12:01 AM  Type of Therapy:  Group Therapy  Participation Level:  Active  Participation Quality:  Appropriate  Affect:  Appropriate  Cognitive:  Appropriate  Insight:  Appropriate and Good  Engagement in Group:  Engaged  Modes of Intervention:  n/a  Summary of Progress/Problems:  Jesse Obrien 01/04/2015, 12:01 AM

## 2015-01-04 NOTE — Discharge Summary (Signed)
Physician Discharge Summary Note  Patient:  Jesse Obrien is an 54 y.o., male MRN:  [id number] DOB:  05-06-1961 Patient phone:  [phone] (home)  Patient address:   [address] [place] [address],  Total Time spent with patient: 30 minutes  Date of Admission:  01/03/2015 Date of Discharge: 01/04/2015  Reason for Admission:  Suicide attempt by cutting in the context of relapse on alcohol.  Identifying data. Jesse Obrien is a 54 year old male with history of alcoholism.  Chief complaint. "I got drunk."  History of present illness. Information was obtained from the patient and the chart. Jesse Obrien is well known to [place] he has been admitted numerous times to our hospital for alcohol detox. He has been drinking more heavily for the past 2 or 3 weeks, reportedly 12 pack a day. While drunk at the patient cut his neck requiring stitches. He has been coming to the emergency room repeatedly in the past few days on the June 19 then 26 then 30.Marland Kitchen He was initially sent home but after a suicide attempt decision was made to admit him to psychiatry. The patient denies any symptoms of depression or psychosis. He denies other than alcohol substance use. He endorses severe anxiety as he usually does. The patient has a history of chronic back pain for which in the past he was prescribed narcotic painkillers that I no longer see them on the list of his medications.  Past psychiatric history. There were multiple hospitalizations at [facility] as well as other hospitals for alcoholism. The patient was discharged a few weeks ago from another hospital following an alcohol treatment but was not able to maintain any sobriety. He considers himself an episodic drinker. He had a several suicide attempts by overdose and now neck cutting. The patient reports that he gets suicidal when drinking. He has been numerous times in alcohol rehabilitation.  Family psychiatric history. Multiple family members  with alcoholism.  Past medical history. Chronic back pain, history of hypertension.  Social history he is retired Scientist, research (life sciences). He does not work with the [place] system. He is married and lives with his wife.   Principal Problem: Major depressive disorder, single episode, severe with psychotic features Discharge Diagnoses: Patient Active Problem List   Diagnosis Date Noted  . 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 Alcohol dependence [F10.20] 01/02/2015  . Laceration [T14.8] 01/02/2015  . Hypertension [I10] 01/02/2015    Musculoskeletal: Strength & Muscle Tone: within normal limits Gait & Station: normal Patient leans: N/A  Psychiatric Specialty Exam: Physical Exam  Nursing note and vitals reviewed.   Review of Systems  Musculoskeletal: Positive for back pain.  All other systems reviewed and are negative.   Blood pressure 125/77, pulse 90, temperature 97.5 F (36.4 C), temperature source Oral, resp. rate 20, height '5\' 11"'  (1.803 m), weight 92.534 kg (204 lb), SpO2 99 %.Body mass index is 28.46 kg/(m^2).  See SRA.                                                  Sleep:  Number of Hours: 6.25   Have you used any form of tobacco in the last 30 days? (Cigarettes, Smokeless Tobacco, Cigars, and/or Pipes): No (FORMER SMOKER. )  Has this patient used any form of tobacco in the last  30 days? (Cigarettes, Smokeless Tobacco, Cigars, and/or Pipes) No  Past Medical History:  Past Medical History  Diagnosis Date  . Hypertension 01/02/2015   History reviewed. No pertinent past surgical history. Family History: History reviewed. No pertinent family history. Social History:  History  Alcohol Use  . Yes     History  Drug Use No    History   Social History  . Marital Status: Married    Spouse Name: N/A  . Number of Children: N/A  . Years of Education: N/A   Social History Main Topics  . Smoking status:  Former Smoker -- 15 years  . Smokeless tobacco: Not on file  . Alcohol Use: Yes  . Drug Use: No  . Sexual Activity: Yes    Birth Control/ Protection: Condom   Other Topics Concern  . None   Social History Narrative    Past Psychiatric History: Hospitalizations:  Outpatient Care:  Substance Abuse Care:  Self-Mutilation:  Suicidal Attempts:  Violent Behaviors:   Risk to Self: Is patient at risk for suicide?: Yes Risk to Others:   Prior Inpatient Therapy:   Prior Outpatient Therapy:    Level of Care:  OP  Hospital Course:    Jesse Obrien is a 54 year old male with a history of alcoholism admitted after a suicide attempt by cutting his neck while drunk.  1. Suicidal ideation. This has resolved.The patient is able to contract for safety.  2. Depression. He has never been compliant with medications in the community. We tried to continue Celexa 40 mg daily as prescribed in the community but the patient refused medication stating that he has no depression.  3. Insomnia. He is on trazodone.  4. Anxiety. He is on Atarax.   5. Alcohol detox. Jesse Obrien did not require alcohol detox. Vital signs were stable.   7. Substance abuse treatment. He was just discharged from a residential program. He did not maintain any sobriety. He is not currently interested in residential treatment.  8. Disposition. He was discharged to home with his wife. He will follow up with [facility] in [facility]. We do recommend intensive outpatient substance abuse treatment program participation    Consults:  None  Significant Diagnostic Studies:  None  Discharge Vitals:   Blood pressure 125/77, pulse 90, temperature 97.5 F (36.4 C), temperature source Oral, resp. rate 20, height '5\' 11"'  (1.803 m), weight 92.534 kg (204 lb), SpO2 99 %. Body mass index is 28.46 kg/(m^2). Lab Results:   Results for orders placed or performed during the hospital encounter of 01/01/15 (from the past 72 hour(s))  Urine Drug  Screen, Qualitative ([place] only)     Status: None   Collection Time: 01/01/15  9:55 PM  Result Value Ref Range   Tricyclic, Ur Screen NONE DETECTED NONE DETECTED   Amphetamines, Ur Screen NONE DETECTED NONE DETECTED   MDMA (Ecstasy)Ur Screen NONE DETECTED NONE DETECTED   Cocaine Metabolite,Ur [place] NONE DETECTED NONE DETECTED   Opiate, Ur Screen NONE DETECTED NONE DETECTED   Phencyclidine (PCP) Ur S NONE DETECTED NONE DETECTED   Cannabinoid 50 Ng, Ur [place] NONE DETECTED NONE DETECTED   Barbiturates, Ur Screen NONE DETECTED NONE DETECTED   Benzodiazepine, Ur Scrn NONE DETECTED NONE DETECTED   Methadone Scn, Ur NONE DETECTED NONE DETECTED    Comment: (NOTE) 761  Tricyclics, urine               Cutoff 1000 ng/mL 200  Amphetamines, urine  Cutoff 1000 ng/mL 300  MDMA (Ecstasy), urine           Cutoff 500 ng/mL 400  Cocaine Metabolite, urine       Cutoff 300 ng/mL 500  Opiate, urine                   Cutoff 300 ng/mL 600  Phencyclidine (PCP), urine      Cutoff 25 ng/mL 700  Cannabinoid, urine              Cutoff 50 ng/mL 800  Barbiturates, urine             Cutoff 200 ng/mL 900  Benzodiazepine, urine           Cutoff 200 ng/mL 1000 Methadone, urine                Cutoff 300 ng/mL 1100 1200 The urine drug screen provides only a preliminary, unconfirmed 1300 analytical test result and should not be used for non-medical 1400 purposes. Clinical consideration and professional judgment should 1500 be applied to any positive drug screen result due to possible 1600 interfering substances. A more specific alternate chemical method 1700 must be used in order to obtain a confirmed analytical result.  1800 Gas chromato graphy / mass spectrometry (GC/MS) is the preferred 1900 confirmatory method.   CBC with Differential/Platelet     Status: Abnormal   Collection Time: 01/01/15  9:56 PM  Result Value Ref Range   WBC 11.7 (H) 3.8 - 10.6 K/uL   RBC 4.46 4.40 - 5.90 MIL/uL   Hemoglobin 14.5 13.0  - 18.0 g/dL   HCT 43.0 40.0 - 52.0 %   MCV 96.4 80.0 - 100.0 fL   MCH 32.4 26.0 - 34.0 pg   MCHC 33.6 32.0 - 36.0 g/dL   RDW 12.7 11.5 - 14.5 %   Platelets 170 150 - 440 K/uL   Neutrophils Relative % 59 %   Neutro Abs 6.9 (H) 1.4 - 6.5 K/uL   Lymphocytes Relative 31 %   Lymphs Abs 3.6 1.0 - 3.6 K/uL   Monocytes Relative 7 %   Monocytes Absolute 0.8 0.2 - 1.0 K/uL   Eosinophils Relative 2 %   Eosinophils Absolute 0.2 0 - 0.7 K/uL   Basophils Relative 1 %   Basophils Absolute 0.1 0 - 0.1 K/uL  Comprehensive metabolic panel     Status: Abnormal   Collection Time: 01/01/15  9:56 PM  Result Value Ref Range   Sodium 137 135 - 145 mmol/L   Potassium 3.5 3.5 - 5.1 mmol/L   Chloride 102 101 - 111 mmol/L   CO2 21 (L) 22 - 32 mmol/L   Glucose, Bld 102 (H) 65 - 99 mg/dL   BUN 7 6 - 20 mg/dL   Creatinine, Ser 0.91 0.61 - 1.24 mg/dL   Calcium 9.1 8.9 - 10.3 mg/dL   Total Protein 7.4 6.5 - 8.1 g/dL   Albumin 4.4 3.5 - 5.0 g/dL   AST 39 15 - 41 U/L   ALT 34 17 - 63 U/L   Alkaline Phosphatase 69 38 - 126 U/L   Total Bilirubin 0.5 0.3 - 1.2 mg/dL   GFR calc non Af Amer >60 >60 mL/min   GFR calc Af Amer >60 >60 mL/min    Comment: (NOTE) The eGFR has been calculated using the CKD EPI equation. This calculation has not been validated in all clinical situations. eGFR's persistently <60 mL/min signify possible Chronic Kidney Disease.  Anion gap 14 5 - 15  Ethanol     Status: Abnormal   Collection Time: 01/01/15  9:56 PM  Result Value Ref Range   Alcohol, Ethyl (B) 265 (H) <5 mg/dL    Comment:        LOWEST DETECTABLE LIMIT FOR SERUM ALCOHOL IS 5 mg/dL FOR MEDICAL PURPOSES ONLY   Acetaminophen level     Status: Abnormal   Collection Time: 01/01/15  9:56 PM  Result Value Ref Range   Acetaminophen (Tylenol), Serum <10 (L) 10 - 30 ug/mL    Comment:        THERAPEUTIC CONCENTRATIONS VARY SIGNIFICANTLY. A RANGE OF 10-30 ug/mL MAY BE AN EFFECTIVE CONCENTRATION FOR MANY  PATIENTS. HOWEVER, SOME ARE BEST TREATED AT CONCENTRATIONS OUTSIDE THIS RANGE. ACETAMINOPHEN CONCENTRATIONS >150 ug/mL AT 4 HOURS AFTER INGESTION AND >50 ug/mL AT 12 HOURS AFTER INGESTION ARE OFTEN ASSOCIATED WITH TOXIC REACTIONS.   Salicylate level     Status: None   Collection Time: 01/01/15  9:56 PM  Result Value Ref Range   Salicylate Lvl 9.8 2.8 - 30.0 mg/dL    Physical Findings: AIMS:  , ,  ,  ,    CIWA:  CIWA-Ar Total: 0 COWS:      See Psychiatric Specialty Exam and Suicide Risk Assessment completed by Attending Physician prior to discharge.  Discharge destination:  Home  Is patient on multiple antipsychotic therapies at discharge:  No   Has Patient had three or more failed trials of antipsychotic monotherapy by history:  No    Recommended Plan for Multiple Antipsychotic Therapies: NA  Discharge Instructions    Diet - low sodium heart healthy    Complete by:  As directed      Increase activity slowly    Complete by:  As directed             Medication List    TAKE these medications      Indication   citalopram 40 MG tablet  Commonly known as:  CELEXA  Take 40 mg by mouth every morning.      disulfiram 250 MG tablet  Commonly known as:  ANTABUSE  Take 250 mg by mouth every morning.      hydrOXYzine 25 MG capsule  Commonly known as:  VISTARIL  Take 25 mg by mouth 3 (three) times daily.      loratadine 10 MG tablet  Commonly known as:  CLARITIN  Take 10 mg by mouth every morning.      traMADol 50 MG tablet  Commonly known as:  ULTRAM  Take 2 tablets by mouth 3 (three) times daily as needed for moderate pain.      traZODone 150 MG tablet  Commonly known as:  DESYREL  Take 150 mg by mouth at bedtime.            Follow-up Information    Schedule an appointment as soon as possible for a visit with [place] at [facility].   Why:  Scehdule appointment with Dr. Jasper Loser as soon as possible.    Contact information:   [place], [place] [address] [place], [place] [address] Phone: [phone] Fax: [phone]      Follow-up recommendations:  Activity:  as tolerated. Diet:  low sodium heart healthy. Other:  keep follow-up appointments.  Comments:    Total Discharge Time: 35 min.  Signed: Orson Slick 01/04/2015, 12:53 PM

## 2015-01-04 NOTE — Progress Notes (Signed)
  [facility] Adult Case Management Discharge Plan :  Will you be returning to the same living situation after discharge:  Yes,  Home  At discharge, do you have transportation home?: Yes,  Wife  Do you have the ability to pay for your medications: Yes,  Insurance, income  Release of information consent forms completed and in the chart;  Patient's signature needed at discharge.  Patient to Follow up at: Follow-up Information    Schedule an appointment as soon as possible for a visit with [place] at [facility].   Why:  Scehdule appointment with Dr. Jasper Loser as soon as possible.    Contact information:   [place], [place] [address] [place], [place] [address] Phone: [phone] Fax: [phone]      Patient denies SI/HI: Yes,  Yes     Safety Planning and Suicide Prevention discussed: Yes,  With wife and patient   Have you used any form of tobacco in the last 30 days? (Cigarettes, Smokeless Tobacco, Cigars, and/or Pipes): No (FORMER SMOKER. )  Has patient been referred to the Quitline?: Patient refused referral  Wray Kearns MSW, [place]  01/04/2015, 11:48 AM

## 2015-01-04 NOTE — Progress Notes (Signed)
Patient denies SI/HI, denies A/V hallucinations. Patient verbalizes understanding of discharge instructions, follow up care. Patient given all belongings from  locker. Patient escorted out by staff, transported by family.

## 2015-01-04 NOTE — BHH Counselor (Signed)
CSW was unable to complete assessment. Pt was discharged within 48 hours. Pt plans to return home and follow up with his PCP.   [place] MSW, [place]  01/04/2015 11:50 AM

## 2015-01-06 NOTE — Progress Notes (Signed)
AVS H&P Discharge Summary faxed to [place] ([place])

## 2015-01-06 NOTE — BH Specialist Note (Signed)
Telephone call was received from [place] with [employer] on 01-06-2015 @ 14:40 tp provide Auth #: [id number]; to call with info to [phone]. Client was discharged on 01-04-2015.

## 2015-01-30 ENCOUNTER — Encounter: Payer: Self-pay | Admitting: Emergency Medicine

## 2015-01-30 ENCOUNTER — Emergency Department
Admission: EM | Admit: 2015-01-30 | Discharge: 2015-01-30 | Disposition: A | Discharge status: Other Acute Inpatient Hospital | Attending: Emergency Medicine | Admitting: Emergency Medicine

## 2015-01-30 DIAGNOSIS — R4689 Other symptoms and signs involving appearance and behavior: Secondary | ICD-10-CM

## 2015-01-30 DIAGNOSIS — Z87891 Personal history of nicotine dependence: Secondary | ICD-10-CM | POA: Insufficient documentation

## 2015-01-30 DIAGNOSIS — Z79899 Other long term (current) drug therapy: Secondary | ICD-10-CM | POA: Insufficient documentation

## 2015-01-30 DIAGNOSIS — F911 Conduct disorder, childhood-onset type: Secondary | ICD-10-CM | POA: Diagnosis not present

## 2015-01-30 DIAGNOSIS — F10129 Alcohol abuse with intoxication, unspecified: Secondary | ICD-10-CM | POA: Diagnosis not present

## 2015-01-30 DIAGNOSIS — I10 Essential (primary) hypertension: Secondary | ICD-10-CM | POA: Diagnosis not present

## 2015-01-30 DIAGNOSIS — F10929 Alcohol use, unspecified with intoxication, unspecified: Secondary | ICD-10-CM

## 2015-01-30 DIAGNOSIS — Z79891 Long term (current) use of opiate analgesic: Secondary | ICD-10-CM | POA: Diagnosis not present

## 2015-01-30 LAB — CBC
HCT: 44.3 % (ref 40.0–52.0)
Hemoglobin: 15.2 g/dL (ref 13.0–18.0)
MCH: 33.2 pg (ref 26.0–34.0)
MCHC: 34.2 g/dL (ref 32.0–36.0)
MCV: 96.9 fL (ref 80.0–100.0)
PLATELETS: 171 10*3/uL (ref 150–440)
RBC: 4.57 MIL/uL (ref 4.40–5.90)
RDW: 13.5 % (ref 11.5–14.5)
WBC: 10 10*3/uL (ref 3.8–10.6)

## 2015-01-30 LAB — URINE DRUG SCREEN, QUALITATIVE (ARMC ONLY)
Amphetamines, Ur Screen: NOT DETECTED
BENZODIAZEPINE, UR SCRN: NOT DETECTED
Barbiturates, Ur Screen: NOT DETECTED
CANNABINOID 50 NG, UR ~~LOC~~: NOT DETECTED
COCAINE METABOLITE, UR ~~LOC~~: NOT DETECTED
MDMA (ECSTASY) UR SCREEN: NOT DETECTED
Methadone Scn, Ur: NOT DETECTED
Opiate, Ur Screen: NOT DETECTED
PHENCYCLIDINE (PCP) UR S: NOT DETECTED
TRICYCLIC, UR SCREEN: NOT DETECTED

## 2015-01-30 LAB — COMPREHENSIVE METABOLIC PANEL
ALBUMIN: 4.4 g/dL (ref 3.5–5.0)
ALT: 54 U/L (ref 17–63)
ANION GAP: 13 (ref 5–15)
AST: 49 U/L — ABNORMAL HIGH (ref 15–41)
Alkaline Phosphatase: 83 U/L (ref 38–126)
BUN: 7 mg/dL (ref 6–20)
CALCIUM: 9.1 mg/dL (ref 8.9–10.3)
CO2: 22 mmol/L (ref 22–32)
Chloride: 105 mmol/L (ref 101–111)
Creatinine, Ser: 0.8 mg/dL (ref 0.61–1.24)
GFR calc Af Amer: >60 mL/min (ref >60)
GFR calc non Af Amer: >60 mL/min (ref >60)
Glucose, Bld: 103 mg/dL — ABNORMAL HIGH (ref 65–99)
Potassium: 3.4 mmol/L — ABNORMAL LOW (ref 3.5–5.1)
Sodium: 140 mmol/L (ref 135–145)
TOTAL PROTEIN: 8 g/dL (ref 6.5–8.1)
Total Bilirubin: 0.5 mg/dL (ref 0.3–1.2)

## 2015-01-30 LAB — ACETAMINOPHEN LEVEL: Acetaminophen (Tylenol), Serum: <10 ug/mL — ABNORMAL LOW (ref 10–30)

## 2015-01-30 LAB — SALICYLATE LEVEL: Salicylate Lvl: <4 mg/dL (ref 2.8–30.0)

## 2015-01-30 LAB — ETHANOL: Alcohol, Ethyl (B): 280 mg/dL — ABNORMAL HIGH (ref <5)

## 2015-01-30 MED ORDER — LORAZEPAM 2 MG/ML IJ SOLN: 0.0000 mg | Freq: Two times a day (BID) | INTRAMUSCULAR | Status: DC

## 2015-01-30 MED ORDER — VITAMIN B-1 100 MG PO TABS: 100.0000 mg | ORAL_TABLET | Freq: Every day | ORAL | Status: DC

## 2015-01-30 MED ORDER — HALOPERIDOL 5 MG PO TABS
5.0000 mg | ORAL_TABLET | Freq: Once | ORAL | Status: AC
  Administered 2015-01-30: 5 mg via ORAL

## 2015-01-30 MED ORDER — LORAZEPAM 2 MG PO TABS
ORAL_TABLET | ORAL | Status: AC
  Administered 2015-01-30: 2 mg via ORAL
  Filled 2015-01-30: qty 1

## 2015-01-30 MED ORDER — LORAZEPAM 2 MG PO TABS: 0.0000 mg | ORAL_TABLET | Freq: Two times a day (BID) | ORAL | Status: DC

## 2015-01-30 MED ORDER — THIAMINE HCL 100 MG/ML IJ SOLN: 100.0000 mg | Freq: Every day | INTRAMUSCULAR | Status: DC

## 2015-01-30 MED ORDER — HALOPERIDOL LACTATE 5 MG/ML IJ SOLN
INTRAMUSCULAR | Status: AC
  Filled 2015-01-30: qty 1

## 2015-01-30 MED ORDER — HALOPERIDOL 5 MG PO TABS
ORAL_TABLET | ORAL | Status: AC
  Administered 2015-01-30: 5 mg via ORAL
  Filled 2015-01-30: qty 1

## 2015-01-30 MED ORDER — LORAZEPAM 2 MG PO TABS
2.0000 mg | ORAL_TABLET | Freq: Once | ORAL | Status: AC
  Administered 2015-01-30: 2 mg via ORAL

## 2015-01-30 MED ORDER — LORAZEPAM 2 MG PO TABS: 0.0000 mg | ORAL_TABLET | Freq: Four times a day (QID) | ORAL | Status: DC

## 2015-01-30 MED ORDER — LORAZEPAM 2 MG/ML IJ SOLN: 0.0000 mg | Freq: Four times a day (QID) | INTRAMUSCULAR | Status: DC

## 2015-01-30 NOTE — ED Notes (Signed)
BEHAVIORAL HEALTH ROUNDING Patient sleeping: Yes.   Patient alert and oriented: not applicable Behavior appropriate: Yes.  ; If no, describe:  Nutrition and fluids offered: Yes  Toileting and hygiene offered: Yes  Sitter present: yes Law enforcement present: Yes  
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
## 2015-01-30 NOTE — ED Notes (Signed)
discharge instructions reviewed with the pt and he verbalized agreement and understanding - with f/u at [facility]  1/1 bags of belongings returned to him and he verbalized that he received back all belongings that he came here with

## 2015-01-30 NOTE — BH Assessment (Signed)
Pt intoxicated.  Pt responds to questions with "no comment".

## 2015-01-30 NOTE — Discharge Instructions (Signed)
Follow-up at the [facility] for ongoing care. Return to the emergency department if you have any thoughts of self-harm, thoughts of hurting others, or if you have other urgent concerns.  Alcohol Intoxication Alcohol intoxication occurs when you drink enough alcohol that it affects your ability to function. It can be mild or very severe. Drinking a lot of alcohol in a short time is called binge drinking. This can be very harmful. Drinking alcohol can also be more dangerous if you are taking medicines or other drugs. Some of the effects caused by alcohol may include:  Loss of coordination.  Changes in mood and behavior.  Unclear thinking.  Trouble talking (slurred speech).  Throwing up (vomiting).  Confusion.  Slowed breathing.  Twitching and shaking (seizures).  Loss of consciousness. HOME CARE  Do not drive after drinking alcohol.  Drink enough water and fluids to keep your pee (urine) clear or pale yellow. Avoid caffeine.  Only take medicine as told by your doctor. GET HELP IF:  You throw up (vomit) many times.  You do not feel better after a few days.  You frequently have alcohol intoxication. Your doctor can help decide if you should see a substance use treatment counselor. GET HELP RIGHT AWAY IF:  You become shaky when you stop drinking.  You have twitching and shaking.  You throw up blood. It may look bright red or like coffee grounds.  You notice blood in your poop (bowel movements).  You become lightheaded or pass out (faint). MAKE SURE YOU:   Understand these instructions.  Will watch your condition.  Will get help right away if you are not doing well or get worse. Document Released: 12/07/2007 Document Revised: 02/20/2013 Document Reviewed: 11/23/2012 [facility] Patient Information 2015 [place], [place]. This information is not intended to replace advice given to you by your health care provider. Make sure you discuss any questions you have with your health  care provider.

## 2015-01-30 NOTE — ED Notes (Signed)
MD at bedside. 

## 2015-01-30 NOTE — ED Notes (Signed)

## 2015-01-30 NOTE — ED Notes (Signed)
Pt to be discharged to home

## 2015-01-30 NOTE — ED Notes (Signed)
BEHAVIORAL HEALTH ROUNDING Patient sleeping: No. Patient alert and oriented: yes Behavior appropriate: Yes.  ; If no, describe:  Nutrition and fluids offered: yes Toileting and hygiene offered: Yes  Sitter present: q15 minute observations and security camera monitoring Law enforcement present: Yes  ODS  

## 2015-01-30 NOTE — ED Notes (Signed)
md Clapacs is in consulting with him at this time

## 2015-01-30 NOTE — ED Notes (Signed)
Pt. Still does not want to talk about what happen this evening to bring him to the ED.  Pt. Does not show any interest in going to a rehap facility.

## 2015-01-30 NOTE — ED Notes (Signed)
BEHAVIORAL HEALTH ROUNDING Patient sleeping: No. Patient alert and oriented: yes Behavior appropriate: Yes.  ; If no, describe:  Nutrition and fluids offered: Yes  Toileting and hygiene offered: Yes  Sitter present: yes Law enforcement present: Yes   Pt in room, eating breakfast tray.

## 2015-01-30 NOTE — ED Notes (Signed)
Pt sleeping at this time, no distress noted. Will continue to monitor.   BEHAVIORAL HEALTH ROUNDING Patient sleeping: Yes.   Patient alert and oriented: not applicable Behavior appropriate: Yes.  ; If no, describe:  Nutrition and fluids offered: Yes  Toileting and hygiene offered: Yes  Sitter present: yes Law enforcement present: Yes   ENVIRONMENTAL ASSESSMENT Potentially harmful objects out of patient reach: Yes.   Personal belongings secured: Yes.   Patient dressed in hospital provided attire only: Yes.   Plastic bags out of patient reach: Yes.   Patient care equipment (cords, cables, call bells, lines, and drains) shortened, removed, or accounted for: Yes.   Equipment and supplies removed from bottom of stretcher: Yes.   Potentially toxic materials out of patient reach: Yes.   Sharps container removed or out of patient reach: Yes.

## 2015-01-30 NOTE — ED Notes (Addendum)
BEHAVIORAL HEALTH ROUNDING Patient sleeping: No. Patient alert and oriented: yes Behavior appropriate: No.; If no, describe: pt. Is intoxicated, pt. Repeats "no comment" Nutrition and fluids offered: Yes  Toileting and hygiene offered: Yes  Sitter present: no Law enforcement present: Yes

## 2015-01-30 NOTE — ED Notes (Signed)
Pt's glasses found in triage.  Pt's glasses placed in belonging's bag in [facility] locker room

## 2015-01-30 NOTE — ED Notes (Signed)
Pt presents to ER alert and in NAD. Pt is here voluntarily with [place]. Pt states "no comment" when asked any question by RN.

## 2015-01-30 NOTE — ED Notes (Signed)
Pt. Stated "I did not say anything inappropriate to that nurse". (referring to our tech).

## 2015-01-30 NOTE — ED Notes (Signed)
BEHAVIORAL HEALTH ROUNDING  Patient sleeping: Yes.  Patient alert and oriented: no  Behavior appropriate: Yes. ; If no, describe:  Nutrition and fluids offered: No  Toileting and hygiene offered: No  Sitter present: no  Law enforcement present: Yes   

## 2015-01-30 NOTE — ED Notes (Signed)
Pt. Is here voluntarily, Pt. Will not answer questions.  Pt. States "no comment".

## 2015-01-30 NOTE — ED Provider Notes (Addendum)
-----------------------------------------   2:52 PM on 01/30/2015 -----------------------------------------  Mr. Ostlund has been seen by Dr. Weber Cooks. Mr. Siler is now sober and denying any suicidal ideation. He appears ambivalent regarding whether he wants to quit drinking. This is a long-term problem for him. 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 He is aware of these resources.  At this time, the involuntary commitment has been rescinded, the patient is not suicidal, he is no longer acting aggressively, and he is communicative and answering questions. We will release him from the emergency department.  Dx:  Alcohol abuse, chronic Alcohol intoxication, acute Aggressive behavior while intoxicated   Ahmed Prima, MD 01/30/15 [place], MD 01/30/15 [phone]

## 2015-01-30 NOTE — Consult Note (Signed)
[name] Face-to-Face Psychiatry Consult   Reason for Consult:  Consult for this 54 year old man with a history of alcohol abuse Referring Physician:  Thomasene Lot Patient Identification: Jesse Obrien MRN:  [id number] Principal Diagnosis: <principal problem not specified> Diagnosis:   Patient Active Problem List   Diagnosis Date Noted  . 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 Alcohol dependence [F10.20] 01/02/2015  . Laceration [T14.8] 01/02/2015  . Hypertension [I10] 01/02/2015    Total Time spent with patient: 1 hour  Subjective:   Jesse Obrien is a 54 y.o. male patient admitted with "I drank too much".  HPI:  Information from the patient and the chart. Patient came in after calling 911 himself because he drank too much. He says that he's been drinking about 12-24 beers a day. He's been doing it for several days in a row. Asian has a history of alcohol dependence and will sometimes stay stable briefly but then started binging like he is doing now. Denies that he's been abusing any other drugs. Denies suicidal ideation. Denies hallucinations. No homicidal ideation. Requesting discharge home.  Past psychiatric history: Patient is had multiple visits to the emergency room and inpatient treatments for his alcohol abuse. No history of suicide attempts. He has had suicidal statements in the past that led to hospitalization but currently is denying any wish to harm himself. No psychotic symptoms. Not currently taking any psychiatric medicine. He had been prescribed Antabuse and Celexa when he was at [facility] which was his last detox.  Social history: Patient lives with his wife. He is retired. [employer]. Spends most of his time in leisure around the house.  Medical history: History of high blood pressure for which she is not compliant with his medicine. Chronic joint pain. No other significant medical problems.  Family history: Positive  for alcohol abuse  Current medications he is not taking any  Substance abuse history: No history of seizures or delirium tremens. Doesn't abuse other drugs regularly.  HPI Elements:   Quality:  Heavy drinking with accompanying dysphoric mood. Severity:  Moderate. Timing:  Worse over the last couple days. Duration:  Chronic problem. Context:  Ongoing alcohol abuse.  Past Medical History:  Past Medical History  Diagnosis Date  . Hypertension 01/02/2015   History reviewed. No pertinent past surgical history. Family History: History reviewed. No pertinent family history. Social History:  History  Alcohol Use  . Yes     History  Drug Use No    History   Social History  . Marital Status: Married    Spouse Name: N/A  . Number of Children: N/A  . Years of Education: N/A   Social History Main Topics  . Smoking status: Former Smoker -- 15 years  . Smokeless tobacco: Not on file  . Alcohol Use: Yes  . Drug Use: No  . Sexual Activity: Yes    Birth Control/ Protection: Condom   Other Topics Concern  . None   Social History Narrative   Additional Social History:    History of alcohol / drug use?: No history of alcohol / drug abuse ([place] refuses to comment on how much he has consumed.)                     Allergies:  No Known Allergies  Labs:  Results for orders placed or performed during the hospital encounter of 01/30/15 (from the past 48 hour(s))  Comprehensive metabolic panel  Status: Abnormal   Collection Time: 01/30/15  1:13 AM  Result Value Ref Range   Sodium 140 135 - 145 mmol/L   Potassium 3.4 (L) 3.5 - 5.1 mmol/L   Chloride 105 101 - 111 mmol/L   CO2 22 22 - 32 mmol/L   Glucose, Bld 103 (H) 65 - 99 mg/dL   BUN 7 6 - 20 mg/dL   Creatinine, Ser 0.80 0.61 - 1.24 mg/dL   Calcium 9.1 8.9 - 10.3 mg/dL   Total Protein 8.0 6.5 - 8.1 g/dL   Albumin 4.4 3.5 - 5.0 g/dL   AST 49 (H) 15 - 41 U/L   ALT 54 17 - 63 U/L   Alkaline Phosphatase 83 38 - 126 U/L    Total Bilirubin 0.5 0.3 - 1.2 mg/dL   GFR calc non Af Amer >60 >60 mL/min   GFR calc Af Amer >60 >60 mL/min    Comment: (NOTE) The eGFR has been calculated using the CKD EPI equation. This calculation has not been validated in all clinical situations. eGFR's persistently <60 mL/min signify possible Chronic Kidney Disease.    Anion gap 13 5 - 15  Ethanol (ETOH)     Status: Abnormal   Collection Time: 01/30/15  1:13 AM  Result Value Ref Range   Alcohol, Ethyl (B) 280 (H) <5 mg/dL    Comment:        LOWEST DETECTABLE LIMIT FOR SERUM ALCOHOL IS 5 mg/dL FOR MEDICAL PURPOSES ONLY   Salicylate level     Status: None   Collection Time: 01/30/15  1:13 AM  Result Value Ref Range   Salicylate Lvl <3.2 2.8 - 30.0 mg/dL  Acetaminophen level     Status: Abnormal   Collection Time: 01/30/15  1:13 AM  Result Value Ref Range   Acetaminophen (Tylenol), Serum <10 (L) 10 - 30 ug/mL    Comment:        THERAPEUTIC CONCENTRATIONS VARY SIGNIFICANTLY. A RANGE OF 10-30 ug/mL MAY BE AN EFFECTIVE CONCENTRATION FOR MANY PATIENTS. HOWEVER, SOME ARE BEST TREATED AT CONCENTRATIONS OUTSIDE THIS RANGE. ACETAMINOPHEN CONCENTRATIONS >150 ug/mL AT 4 HOURS AFTER INGESTION AND >50 ug/mL AT 12 HOURS AFTER INGESTION ARE OFTEN ASSOCIATED WITH TOXIC REACTIONS.   CBC     Status: None   Collection Time: 01/30/15  1:13 AM  Result Value Ref Range   WBC 10.0 3.8 - 10.6 K/uL   RBC 4.57 4.40 - 5.90 MIL/uL   Hemoglobin 15.2 13.0 - 18.0 g/dL   HCT 44.3 40.0 - 52.0 %   MCV 96.9 80.0 - 100.0 fL   MCH 33.2 26.0 - 34.0 pg   MCHC 34.2 32.0 - 36.0 g/dL   RDW 13.5 11.5 - 14.5 %   Platelets 171 150 - 440 K/uL  Urine Drug Screen, Qualitative ([facility] only)     Status: None   Collection Time: 01/30/15  1:16 AM  Result Value Ref Range   Tricyclic, Ur Screen NONE DETECTED NONE DETECTED   Amphetamines, Ur Screen NONE DETECTED NONE DETECTED   MDMA (Ecstasy)Ur Screen NONE DETECTED NONE DETECTED   Cocaine Metabolite,Ur [place]  NONE DETECTED NONE DETECTED   Opiate, Ur Screen NONE DETECTED NONE DETECTED   Phencyclidine (PCP) Ur S NONE DETECTED NONE DETECTED   Cannabinoid 50 Ng, Ur [place] NONE DETECTED NONE DETECTED   Barbiturates, Ur Screen NONE DETECTED NONE DETECTED   Benzodiazepine, Ur Scrn NONE DETECTED NONE DETECTED   Methadone Scn, Ur NONE DETECTED NONE DETECTED    Comment: (NOTE) 100  Tricyclics, urine               Cutoff 1000 ng/mL 200  Amphetamines, urine             Cutoff 1000 ng/mL 300  MDMA (Ecstasy), urine           Cutoff 500 ng/mL 400  Cocaine Metabolite, urine       Cutoff 300 ng/mL 500  Opiate, urine                   Cutoff 300 ng/mL 600  Phencyclidine (PCP), urine      Cutoff 25 ng/mL 700  Cannabinoid, urine              Cutoff 50 ng/mL 800  Barbiturates, urine             Cutoff 200 ng/mL 900  Benzodiazepine, urine           Cutoff 200 ng/mL 1000 Methadone, urine                Cutoff 300 ng/mL 1100 1200 The urine drug screen provides only a preliminary, unconfirmed 1300 analytical test result and should not be used for non-medical 1400 purposes. Clinical consideration and professional judgment should 1500 be applied to any positive drug screen result due to possible 1600 interfering substances. A more specific alternate chemical method 1700 must be used in order to obtain a confirmed analytical result.  1800 Gas chromato graphy / mass spectrometry (GC/MS) is the preferred 1900 confirmatory method.     Vitals: Blood pressure 113/72, pulse 112, temperature 97.5 F (36.4 C), temperature source Oral, resp. rate 18, height _0  (1.803 m), weight 95.255 kg (210 lb), SpO2 96 %.  Risk to Self: Suicidal Ideation: No (Patient refuses to answer) Suicidal Intent: No (Patient refuses to answer) Is patient at risk for suicide?: No (Pt refuses to answer) Suicidal Plan?: No Specify Current Suicidal Plan: Pt refuses to answer Access to Means: No (Pt refuses to answer) What has been your use of  drugs/alcohol within the last 12 months?: Pt reuses to answer How many times?: 4 Other Self Harm Risks: unknown Triggers for Past Attempts: Unpredictable Intentional Self Injurious Behavior: Cutting Risk to Others: Homicidal Ideation: No Thoughts of Harm to Others: No Current Homicidal Intent: No Current Homicidal Plan: No Access to Homicidal Means: No Identified Victim: N/A History of harm to others?: No Assessment of Violence: None Noted Does patient have access to weapons?: No (unknown ) Criminal Charges Pending?: No Does patient have a court date: No Prior Inpatient Therapy: Prior Inpatient Therapy: Yes Prior Therapy Facilty/Provider(s): [facility] Reason for Treatment: suicidal Prior Outpatient Therapy: Prior Outpatient Therapy: No  Current Facility-Administered Medications  Medication Dose Route Frequency Provider Last Rate Last Dose  . LORazepam (ATIVAN) injection 0-4 mg  0-4 mg Intravenous 4 times per day Delman Kitten, MD      . LORazepam (ATIVAN) injection 0-4 mg  0-4 mg Intravenous Q12H Delman Kitten, MD      . LORazepam (ATIVAN) tablet 0-4 mg  0-4 mg Oral 4 times per day Delman Kitten, MD      . LORazepam (ATIVAN) tablet 0-4 mg  0-4 mg Oral Q12H Delman Kitten, MD      . thiamine (B-1) injection 100 mg  100 mg Intravenous Daily Delman Kitten, MD   100 mg at 01/30/15 1108  . thiamine (VITAMIN B-1) tablet 100 mg  100 mg Oral Daily Delman Kitten, MD   100 mg  at 01/30/15 1108   Current Outpatient Prescriptions  Medication Sig Dispense Refill  . citalopram (CELEXA) 40 MG tablet Take 40 mg by mouth every morning.    . disulfiram (ANTABUSE) 250 MG tablet Take 250 mg by mouth every morning.    . hydrOXYzine (VISTARIL) 25 MG capsule Take 25 mg by mouth 3 (three) times daily.    Marland Kitchen loratadine (CLARITIN) 10 MG tablet Take 10 mg by mouth every morning.    . traMADol (ULTRAM) 50 MG tablet Take 2 tablets by mouth 3 (three) times daily as needed for moderate pain.     . traZODone (DESYREL) 150 MG tablet Take  150 mg by mouth at bedtime.      Musculoskeletal: Strength & Muscle Tone: within normal limits Gait & Station: normal Patient leans: N/A  Psychiatric Specialty Exam: Physical Exam  Constitutional: He appears well-developed and well-nourished.  HENT:  Head: Normocephalic and atraumatic.  Eyes: Conjunctivae are normal. Pupils are equal, round, and reactive to light.  Neck: Normal range of motion.  Cardiovascular: Normal heart sounds.   Respiratory: Effort normal.  GI: Soft.  Musculoskeletal: Normal range of motion.  Neurological: He is alert.  Skin: Skin is warm and dry.  Psychiatric: His behavior is normal. Thought content normal. His affect is blunt. His speech is delayed. Cognition and memory are normal. He expresses impulsivity.  Patient is alert and oriented. Doesn't appear to be delirious. Not hallucinating. No suicidal or homicidal ideation. Understands his current situation.    Review of Systems  Constitutional: Negative.   HENT: Negative.   Eyes: Negative.   Respiratory: Negative.   Cardiovascular: Negative.   Gastrointestinal: Negative.   Musculoskeletal: Negative.   Skin: Negative.   Neurological: Negative.   Psychiatric/Behavioral: Positive for substance abuse. Negative for depression, suicidal ideas, hallucinations and memory loss. The patient has insomnia. The patient is not nervous/anxious.     Blood pressure 113/72, pulse 112, temperature 97.5 F (36.4 C), temperature source Oral, resp. rate 18, height _0  (1.803 m), weight 95.255 kg (210 lb), SpO2 96 %.Body mass index is 29.3 kg/(m^2).  General Appearance: Disheveled  Eye Sport and exercise psychologist::  Fair  Speech:  Slow  Volume:  Decreased  Mood:  Dysphoric  Affect:  Constricted  Thought Process:  Goal Directed  Orientation:  Full (Time, Place, and Person)  Thought Content:  Negative  Suicidal Thoughts:  No  Homicidal Thoughts:  No  Memory:  Immediate;   Fair Recent;   Fair Remote;   Good  Judgement:  Fair   Insight:  Fair  Psychomotor Activity:  Decreased  Concentration:  Fair  Recall:  [employer] of Knowledge:Fair  Language: Fair  Akathisia:  No  Handed:  Right  AIMS (if indicated):     Assets:  Armed forces logistics/support/administrative officer Physical Health Social Support  ADL's:  Intact  Cognition: WNL  Sleep:      Medical Decision Making: Review of Psycho-Social Stressors (1), Established Problem, Worsening (2), Review or order medicine tests (1) and Review of Medication Regimen & Side Effects (2)  Treatment Plan Summary: Plan Patient is requesting discharge. Currently his vital signs are stable. There is no sign of delirium. He has not had a seizure. He had a blood alcohol level up close to 300 when he presented very early this morning. Does not seem any longer to be intoxicated. Patient is denying any suicidal or homicidal thoughts and has not behaved in an acutely dangerous manner. His insight extends so far as to know that  he has a drinking problem but he is vague about his intention of stopping. Patient was given encouragement to rethink his desire to stop. He was educated about the multiple negative consequences physically and mentally to his continued drinking. Patient agrees to consider returning to outpatient treatment in the community. He is no longer requiring inpatient treatment. Discharge from the emergency room. Case discussed with emergency room doctor.  Plan:  Patient does not meet criteria for psychiatric inpatient admission. Discussed crisis plan, support from social network, calling 911, coming to the Emergency Department, and calling Suicide Hotline. Disposition: Patient can be discharged from the emergency room  [place] 01/30/2015 4:33 PM

## 2015-01-30 NOTE — ED Notes (Signed)
TTS went in to see pt. Pt. Would not answer questions.

## 2015-01-30 NOTE — ED Notes (Signed)
ED BHU [place] Is the patient under IVC or is there intent for IVC: Yes.   Is the patient medically cleared: Yes.   Is there vacancy in the ED BHU: Yes.   Is the population mix appropriate for patient: Yes.   Is the patient awaiting placement in inpatient or outpatient setting: no Has the patient had a psychiatric consult: no pending  Survey of unit performed for contraband, proper placement and condition of furniture, tampering with fixtures in bathroom, shower, and each patient room: Yes.  ; Findings:  APPEARANCE/BEHAVIOR Calm and cooperative NEURO ASSESSMENT Orientation: oriented x3  Denies pain Hallucinations: No.None noted (Hallucinations) Speech: Normal Gait: normal RESPIRATORY ASSESSMENT Even  Unlabored respirations  CARDIOVASCULAR ASSESSMENT Pulses equal   regular rate  Skin warm and dry   GASTROINTESTINAL ASSESSMENT no GI complaint EXTREMITIES Full ROM  PLAN OF CARE Provide calm/safe environment. Vital signs assessed twice daily. ED BHU Assessment once each 12-hour shift. Collaborate with intake RN daily or as condition indicates. Assure the ED provider has rounded once each shift. Provide and encourage hygiene. Provide redirection as needed. Assess for escalating behavior; address immediately and inform ED provider.  Assess family dynamic and appropriateness for visitation as needed: Yes.  ; If necessary, describe findings:  Educate the patient/family about BHU procedures/visitation: Yes.  ; If necessary, describe findings:
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
## 2015-02-28 ENCOUNTER — Emergency Department
Admission: EM | Admit: 2015-02-28 | Discharge: 2015-03-01 | Disposition: A | Discharge status: Home / Self Care | Attending: Emergency Medicine | Admitting: Emergency Medicine

## 2015-02-28 DIAGNOSIS — X789XXA Intentional self-harm by unspecified sharp object, initial encounter: Secondary | ICD-10-CM | POA: Diagnosis not present

## 2015-02-28 DIAGNOSIS — Y9289 Other specified places as the place of occurrence of the external cause: Secondary | ICD-10-CM | POA: Insufficient documentation

## 2015-02-28 DIAGNOSIS — Z87891 Personal history of nicotine dependence: Secondary | ICD-10-CM | POA: Insufficient documentation

## 2015-02-28 DIAGNOSIS — S199XXA Unspecified injury of neck, initial encounter: Secondary | ICD-10-CM | POA: Diagnosis present

## 2015-02-28 DIAGNOSIS — S1081XA Abrasion of other specified part of neck, initial encounter: Secondary | ICD-10-CM | POA: Diagnosis not present

## 2015-02-28 DIAGNOSIS — Y9389 Activity, other specified: Secondary | ICD-10-CM | POA: Insufficient documentation

## 2015-02-28 DIAGNOSIS — T1491XA Suicide attempt, initial encounter: Secondary | ICD-10-CM

## 2015-02-28 DIAGNOSIS — Z79899 Other long term (current) drug therapy: Secondary | ICD-10-CM | POA: Diagnosis not present

## 2015-02-28 DIAGNOSIS — F10920 Alcohol use, unspecified with intoxication, uncomplicated: Secondary | ICD-10-CM

## 2015-02-28 DIAGNOSIS — Y998 Other external cause status: Secondary | ICD-10-CM | POA: Diagnosis not present

## 2015-02-28 DIAGNOSIS — I10 Essential (primary) hypertension: Secondary | ICD-10-CM | POA: Insufficient documentation

## 2015-02-28 DIAGNOSIS — F10220 Alcohol dependence with intoxication, uncomplicated: Secondary | ICD-10-CM | POA: Insufficient documentation
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
## 2015-03-01 LAB — COMPREHENSIVE METABOLIC PANEL
ALBUMIN: 4.6 g/dL (ref 3.5–5.0)
ALT: 45 U/L (ref 17–63)
ANION GAP: 12 (ref 5–15)
AST: 48 U/L — AB (ref 15–41)
Alkaline Phosphatase: 78 U/L (ref 38–126)
BILIRUBIN TOTAL: 0.5 mg/dL (ref 0.3–1.2)
BUN: 7 mg/dL (ref 6–20)
CHLORIDE: 104 mmol/L (ref 101–111)
CO2: 21 mmol/L — AB (ref 22–32)
Calcium: 8.8 mg/dL — ABNORMAL LOW (ref 8.9–10.3)
Creatinine, Ser: 1.05 mg/dL (ref 0.61–1.24)
GFR calc Af Amer: >60 mL/min (ref >60)
GFR calc non Af Amer: >60 mL/min (ref >60)
GLUCOSE: 99 mg/dL (ref 65–99)
POTASSIUM: 3.7 mmol/L (ref 3.5–5.1)
SODIUM: 137 mmol/L (ref 135–145)
TOTAL PROTEIN: 8.3 g/dL — AB (ref 6.5–8.1)

## 2015-03-01 LAB — URINE DRUG SCREEN, QUALITATIVE (ARMC ONLY)
AMPHETAMINES, UR SCREEN: NOT DETECTED — AB
Barbiturates, Ur Screen: NOT DETECTED — AB
Benzodiazepine, Ur Scrn: NOT DETECTED — AB
COCAINE METABOLITE, UR ~~LOC~~: NOT DETECTED — AB
Cannabinoid 50 Ng, Ur ~~LOC~~: NOT DETECTED — AB
MDMA (ECSTASY) UR SCREEN: NOT DETECTED — AB
Methadone Scn, Ur: NOT DETECTED — AB
Opiate, Ur Screen: NOT DETECTED — AB
PHENCYCLIDINE (PCP) UR S: NOT DETECTED — AB
Tricyclic, Ur Screen: NOT DETECTED — AB

## 2015-03-01 LAB — SALICYLATE LEVEL: Salicylate Lvl: 19.2 mg/dL (ref 2.8–30.0)

## 2015-03-01 LAB — CBC
HCT: 48.3 % (ref 40.0–52.0)
Hemoglobin: 16.2 g/dL (ref 13.0–18.0)
MCH: 32.6 pg (ref 26.0–34.0)
MCHC: 33.6 g/dL (ref 32.0–36.0)
MCV: 96.9 fL (ref 80.0–100.0)
PLATELETS: 210 10*3/uL (ref 150–440)
RBC: 4.98 MIL/uL (ref 4.40–5.90)
RDW: 13.8 % (ref 11.5–14.5)
WBC: 11.2 10*3/uL — AB (ref 3.8–10.6)

## 2015-03-01 LAB — TSH: TSH: 1.249 u[IU]/mL (ref 0.350–4.500)

## 2015-03-01 LAB — ETHANOL: ALCOHOL ETHYL (B): 270 mg/dL — AB (ref <5)

## 2015-03-01 LAB — ACETAMINOPHEN LEVEL: Acetaminophen (Tylenol), Serum: <10 ug/mL — ABNORMAL LOW (ref 10–30)

## 2015-03-01 MED ORDER — BACITRACIN ZINC 500 UNIT/GM EX OINT
TOPICAL_OINTMENT | Freq: Once | CUTANEOUS | Status: DC
  Filled 2015-03-01: qty 0.9

## 2015-03-01 NOTE — ED Notes (Signed)
Per charge nurse, pt ok to wear purple paper scrub top out d/t lack of blue paper scrubs, security notified that patient is okay to leave.
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
## 2015-03-01 NOTE — Discharge Instructions (Signed)
Alcohol Intoxication °Alcohol intoxication occurs when the amount of alcohol that a person has consumed impairs his or her ability to mentally and physically function. Alcohol directly impairs the normal chemical activity of the brain. Drinking large amounts of alcohol can lead to changes in mental function and behavior, and it can cause many physical effects that can be harmful.  °Alcohol intoxication can range in severity from mild to very severe. Various factors can affect the level of intoxication that occurs, such as the person's age, gender, weight, frequency of alcohol consumption, and the presence of other medical conditions (such as diabetes, seizures, or heart conditions). Dangerous levels of alcohol intoxication may occur when people drink large amounts of alcohol in a short period (binge drinking). Alcohol can also be especially dangerous when combined with certain prescription medicines or "recreational" drugs. °SIGNS AND SYMPTOMS °Some common signs and symptoms of mild alcohol intoxication include: °· Loss of coordination. °· Changes in mood and behavior. °· Impaired judgment. °· Slurred speech. °As alcohol intoxication progresses to more severe levels, other signs and symptoms will appear. These may include: °· Vomiting. °· Confusion and impaired memory. °· Slowed breathing. °· Seizures. °· Loss of consciousness. °DIAGNOSIS  °Your health care provider will take a medical history and perform a physical exam. You will be asked about the amount and type of alcohol you have consumed. Blood tests will be done to measure the concentration of alcohol in your blood. In many places, your blood alcohol level must be lower than 80 mg/dL (0.08%) to legally drive. However, many dangerous effects of alcohol can occur at much lower levels.  °TREATMENT  °People with alcohol intoxication often do not require treatment. Most of the effects of alcohol intoxication are temporary, and they go away as the alcohol naturally  leaves the body. Your health care provider will monitor your condition until you are stable enough to go home. Fluids are sometimes given through an IV access tube to help prevent dehydration.  °HOME CARE INSTRUCTIONS °· Do not drive after drinking alcohol. °· Stay hydrated. Drink enough water and fluids to keep your urine clear or pale yellow. Avoid caffeine.   °· Only take over-the-counter or prescription medicines as directed by your health care provider.   °SEEK MEDICAL CARE IF:  °· You have persistent vomiting.   °· You do not feel better after a few days. °· You have frequent alcohol intoxication. Your health care provider can help determine if you should see a substance use treatment counselor. °SEEK IMMEDIATE MEDICAL CARE IF:  °· You become shaky or tremble when you try to stop drinking.   °· You shake uncontrollably (seizure).   °· You throw up (vomit) blood. This may be bright red or may look like black coffee grounds.   °· You have blood in your stool. This may be bright red or may appear as a black, tarry, bad smelling stool.   °· You become lightheaded or faint.   °MAKE SURE YOU:  °· Understand these instructions. °· Will watch your condition. °· Will get help right away if you are not doing well or get worse. °Document Released: 03/30/2005 Document Revised: 02/20/2013 Document Reviewed: 11/23/2012 °[employer]® Patient Information ©2015 [employer]. This information is not intended to replace advice given to you by your health care provider. Make sure you discuss any questions you have with your health care provider. ° °Please return immediately if condition worsens. Please contact her primary physician or the physician you were given for referral. If you have any specialist physicians involved   in her treatment and plan please also contact them. Thank you for using [facility]. ° °

## 2015-03-01 NOTE — ED Notes (Signed)
BEHAVIORAL HEALTH ROUNDING Patient sleeping: No. Patient alert and oriented: yes Behavior appropriate: Yes.  ; If no, describe:  Nutrition and fluids offered: Yes  Toileting and hygiene offered: Yes  Sitter present: no Event organiser present: Yes, ODS Animal nutritionist

## 2015-03-01 NOTE — ED Notes (Signed)
BEHAVIORAL HEALTH ROUNDING Patient sleeping: Yes.   Patient alert and oriented: yes Behavior appropriate: Yes.  ; If no, describe:  Nutrition and fluids offered: Yes  Toileting and hygiene offered: Yes  Sitter present: no Event organiser present: Yes, ODS Animal nutritionist

## 2015-03-01 NOTE — ED Notes (Signed)
ED BHU [place] Is the patient under IVC or is there intent for IVC: Yes.   Is the patient medically cleared: Yes.   Is there vacancy in the ED BHU: Yes.   Is the population mix appropriate for patient: Yes.   Is the patient awaiting placement in inpatient or outpatient setting: No. Has the patient had a psychiatric consult: No. Survey of unit performed for contraband, proper placement and condition of furniture, tampering with fixtures in bathroom, shower, and each patient room: Yes.  ; Findings:  APPEARANCE/BEHAVIOR calm and cooperative NEURO ASSESSMENT Orientation: place and person Hallucinations: No.None noted (Hallucinations) Speech: Normal Gait: normal RESPIRATORY ASSESSMENT Normal expansion.  Clear to auscultation.  No rales, rhonchi, or wheezing. CARDIOVASCULAR ASSESSMENT regular rate and rhythm, S1, S2 normal, no murmur, click, rub or gallop GASTROINTESTINAL ASSESSMENT soft, nontender, BS WNL, no r/g EXTREMITIES normal strength, tone, and muscle mass PLAN OF CARE Provide calm/safe environment. Vital signs assessed twice daily. ED BHU Assessment once each 12-hour shift. Collaborate with intake RN daily or as condition indicates. Assure the ED provider has rounded once each shift. Provide and encourage hygiene. Provide redirection as needed. Assess for escalating behavior; address immediately and inform ED provider.  Assess family dynamic and appropriateness for visitation as needed: Yes.  ; If necessary, describe findings:  Educate the patient/family about BHU procedures/visitation: Yes.  ; If necessary, describe findings:

## 2015-03-01 NOTE — ED Provider Notes (Signed)
[facility] Emergency Department Provider Note  ____________________________________________  Time seen: Approximately 0055 AM  I have reviewed the triage vital signs and the nursing notes.   HISTORY  Chief Complaint Suicide Attempt    HPI Jesse Obrien is a 54 y.o. male who comes in tonight with suicidal gesture.Patient reports that he was drinking beer and started feeling worthless and Bactrim socially he decided to cut his neck. The patient reports that he has tried to kill himself before but he is unable to remember when it happened. The patient reports that he drank alcohol and overdosed on sleeping pills. The patient reports that he has been given psych medication to take but he does not like how they make him feel so he does not take them. He reports sometimes he does feel depressed and sometimes he doesn't the patient denies any hallucinations or homicidal ideation. The patient was brought in for further evaluation of his attempted suicide by cutting his neck.   Past Medical History  Diagnosis Date  . Hypertension 01/02/2015    Patient Active Problem List   Diagnosis Date Noted  . Alcohol intoxication   . 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 Alcohol dependence 01/02/2015  . Laceration 01/02/2015  . Hypertension 01/02/2015    History reviewed. No pertinent past surgical history.  Current Outpatient Rx  Name  Route  Sig  Dispense  Refill  . citalopram (CELEXA) 40 MG tablet   Oral   Take 40 mg by mouth every morning.         . disulfiram (ANTABUSE) 250 MG tablet   Oral   Take 250 mg by mouth every morning.         . hydrOXYzine (VISTARIL) 25 MG capsule   Oral   Take 25 mg by mouth 3 (three) times daily.         Marland Kitchen loratadine (CLARITIN) 10 MG tablet   Oral   Take 10 mg by mouth every morning.         . traMADol (ULTRAM) 50 MG tablet   Oral   Take 2 tablets by mouth 3 (three)  times daily as needed for moderate pain.          . traZODone (DESYREL) 150 MG tablet   Oral   Take 150 mg by mouth at bedtime.           Allergies Review of patient's allergies indicates no known allergies.  No family history on file.  Social History Social History  Substance Use Topics  . Smoking status: Former Smoker -- 15 years  . Smokeless tobacco: None  . Alcohol Use: Yes    Review of Systems Constitutional: No fever/chills Eyes: No visual changes. ENT: No sore throat. Cardiovascular: Denies chest pain. Respiratory: Denies shortness of breath. Gastrointestinal: No abdominal pain.  No nausea, no vomiting.  Genitourinary: Negative for dysuria. Musculoskeletal: Negative for back pain. Skin: Superficial abrasions to patient's neck Neurological: Negative for headaches, focal weakness or numbness. Psychiatric:Suicidal gesture  10-point ROS otherwise negative.  ____________________________________________   PHYSICAL EXAM:  VITAL SIGNS: ED Triage Vitals  Enc Vitals Group     BP 03/01/15 0008 103/89 mmHg     Pulse Rate 03/01/15 0008 99     Resp 03/01/15 0008 16     Temp 03/01/15 0008 98.4 F (36.9 C)     Temp Source 03/01/15 0008 Oral     SpO2 03/01/15 0008 96 %  Weight 03/01/15 0008 205 lb (92.987 kg)     Height 03/01/15 0008 5\' 10"  (1.778 m)     Head Cir --      Peak Flow --      Pain Score 02/28/15 2359 0     Pain Loc --      Pain Edu? --      Excl. in [place]? --     Constitutional: Alert and oriented. Well appearing and in no distress. Eyes: Conjunctivae are normal. PERRL. EOMI. Head: Atraumatic. Nose: No congestion/rhinnorhea. Mouth/Throat: Mucous membranes are moist.  Oropharynx non-erythematous. Neck: Multiple superficial linear abrasions to patient's neck Cardiovascular: Normal rate, regular rhythm. Grossly normal heart sounds.  Good peripheral circulation. Respiratory: Normal respiratory effort.  No retractions. Lungs  CTAB. Gastrointestinal: Soft and nontender. No distention. Positive bowel sounds Musculoskeletal: No lower extremity tenderness nor edema.   Neurologic:  Normal speech and language.  Skin:  Multiple superficial linear abrasions noted to patient's anterior neck Psychiatric: Mood and affect are normal. Speech and behavior are normal.  ____________________________________________   LABS (all labs ordered are listed, but only abnormal results are displayed)  Labs Reviewed  CBC - Abnormal; Notable for the following:    WBC 11.2 (*)    All other components within normal limits  COMPREHENSIVE METABOLIC PANEL - Abnormal; Notable for the following:    CO2 21 (*)    Calcium 8.8 (*)    Total Protein 8.3 (*)    AST 48 (*)    All other components within normal limits  URINE DRUG SCREEN, QUALITATIVE ([facility] ONLY) - Abnormal; Notable for the following:    Tricyclic, Ur Screen NOT DETECTED (*)    Amphetamines, Ur Screen NOT DETECTED (*)    MDMA (Ecstasy)Ur Screen NOT DETECTED (*)    Cocaine Metabolite,Ur [place] NOT DETECTED (*)    Opiate, Ur Screen NOT DETECTED (*)    Phencyclidine (PCP) Ur S NOT DETECTED (*)    Cannabinoid 50 Ng, Ur  NOT DETECTED (*)    Barbiturates, Ur Screen NOT DETECTED (*)    Benzodiazepine, Ur Scrn NOT DETECTED (*)    Methadone Scn, Ur NOT DETECTED (*)    All other components within normal limits  ETHANOL - Abnormal; Notable for the following:    Alcohol, Ethyl (B) 270 (*)    All other components within normal limits  ACETAMINOPHEN LEVEL - Abnormal; Notable for the following:    Acetaminophen (Tylenol), Serum <10 (*)    All other components within normal limits  SALICYLATE LEVEL  TSH   ____________________________________________  EKG  None ____________________________________________  RADIOLOGY  None ____________________________________________   PROCEDURES  Procedure(s) performed: None  Critical Care performed:  No  ____________________________________________   INITIAL IMPRESSION / ASSESSMENT AND PLAN / ED COURSE  Pertinent labs & imaging results that were available during my care of the patient were reviewed by me and considered in my medical decision making (see chart for details).  The patient is a 54 year old male who comes in after trying to cut his neck tonight. The patient reports he has been admitted in the past for suicide attempt by overdosing on medication. The patient was brought in under involuntary commitment. His blood alcohol is 270. The patient is under involuntary commitment and will be seen by psych in the morning. The patient has no further complaints or concerns at this time. ____________________________________________   FINAL CLINICAL IMPRESSION(S) / ED DIAGNOSES  Final diagnoses:  Suicide attempt  Alcohol intoxication, uncomplicated  Loney Hering, MD 03/01/15 0700

## 2015-03-01 NOTE — ED Notes (Addendum)
1:1 safety sitter/ suicide precautions not needed per Dahlia Client, MD. Orders placed for q76min safety rounding.

## 2015-03-01 NOTE — ED Notes (Signed)
Patient assigned to appropriate care area. Patient oriented to unit/care area: Informed that, for their safety, care areas are designed for safety and monitored by security cameras at all times; and visiting hours explained to patient. Patient verbalizes understanding, and verbal contract for safety obtained. 

## 2015-03-01 NOTE — ED Notes (Signed)
Pt visualized in NAD at this time. Pt laying in bed, awakens to verbal stimuli. Pt in room with lights and TV off.

## 2015-03-01 NOTE — ED Notes (Signed)
ED BHU Jesse Obrien Is the patient under IVC or is there intent for IVC: Yes.   Is the patient medically cleared: Yes.   Is there vacancy in the ED BHU: Yes.   Is the population mix appropriate for patient: Yes.   Is the patient awaiting placement in inpatient or outpatient setting: No. Has the patient had a psychiatric consult: No. Survey of unit performed for contraband, proper placement and condition of furniture, tampering with fixtures in bathroom, shower, and each patient room: Yes.  ; Findings:  APPEARANCE/BEHAVIOR calm, cooperative and adequate rapport can be established NEURO ASSESSMENT Orientation: time, place and person Hallucinations: No.None noted (Hallucinations) Speech: Normal Gait: normal RESPIRATORY ASSESSMENT No respiratory distress noted. Pt is noted to have expiratory wheezes at this time. CARDIOVASCULAR ASSESSMENT regular rate and rhythm, S1, S2 normal, no murmur, click, rub or gallop GASTROINTESTINAL ASSESSMENT soft, nontender, BS WNL, no r/g EXTREMITIES normal strength, tone, and muscle mass PLAN OF CARE Provide calm/safe environment. Vital signs assessed twice daily. ED BHU Assessment once each 12-hour shift. Collaborate with intake RN daily or as condition indicates. Assure the ED provider has rounded once each shift. Provide and encourage hygiene. Provide redirection as needed. Assess for escalating behavior; address immediately and inform ED provider.  Assess family dynamic and appropriateness for visitation as needed: Yes.  ; If necessary, describe findings:  Educate the patient/family about BHU procedures/visitation: Yes.  ; If necessary, describe findings:

## 2015-03-01 NOTE — ED Notes (Signed)
BEHAVIORAL HEALTH ROUNDING Patient sleeping: Yes.   Patient alert and oriented: not applicable Behavior appropriate: Yes.  ; If no, describe:  Nutrition and fluids offered: No Toileting and hygiene offered: No Sitter present: not applicable Law enforcement present: Yes  

## 2015-03-01 NOTE — ED Notes (Signed)
Pt sleeping.  Breathing even and unlabored.  NAD.

## 2015-03-01 NOTE — ED Notes (Signed)
Breakfast served

## 2015-03-01 NOTE — ED Notes (Signed)

## 2015-03-01 NOTE — ED Notes (Signed)
Pt came to door to request something to drink. Pt given water and a soda, pt asked when psychiatrist would be here to see him, explained to pt that psychiatrist would be here later today.

## 2015-03-01 NOTE — ED Notes (Signed)
Pt is noted to have several laceration to front of neck and side of neck. Red in appearance with dried blood around them, however not currently bleeding at this time. Lacerations are superficial to the neck.

## 2015-03-01 NOTE — ED Notes (Signed)
NAD noted at time of D/C. Pt ambulatory to the lobby at this time. Pt denies questions/concerns about D/C.

## 2015-03-01 NOTE — ED Notes (Signed)
BEHAVIORAL HEALTH ROUNDING Patient sleeping: No. Patient alert and oriented: yes Behavior appropriate: Yes.  ; If no, describe:  Nutrition and fluids offered: Yes Toileting and hygiene offered: Yes  Sitter present: yes Law enforcement present: Yes ODS  

## 2015-03-01 NOTE — Consult Note (Signed)
[name]   Reason for Consult:  Follow up Referring Physician:  ER Patient Identification: Jesse Obrien MRN:  [id number] Principal Diagnosis: <principal problem not specified> Diagnosis:   Patient Active Problem List   Diagnosis Date Noted  . Alcohol intoxication [F10.129]   . 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 Alcohol dependence [F10.20] 01/02/2015  . Laceration [T14.8] 01/02/2015  . Hypertension [I10] 01/02/2015    Total Time spent with patient: 1 hour  Subjective:   Jesse Obrien is a 54 y.o. male patient admitted with a long H/O Alcohol abuse. Was recwently dischArged from [facility] on Antabuse and was given follow up apt with [facility]. Pt had tooth pain and quit taking Antabuse and starting drinking beer at the rate of less than a case per day over weekend tried to cut his neck"superficial" and wife brought him here for help.  HPI:  Pt reports that it was a mistake and that he wants to go home and get help for tooth pain and start taking Antabuse and anti-anxiety meds and keep up his follow up appts and realizes it was a mistake and contracts for safety. HPI Elements:     Past Medical History:  Past Medical History  Diagnosis Date  . Hypertension 01/02/2015   History reviewed. No pertinent past surgical history. Family History: No family history on file. Social History:  History  Alcohol Use  . Yes     History  Drug Use No    Social History   Social History  . Marital Status: Married    Spouse Name: N/A  . Number of Children: N/A  . Years of Education: N/A   Social History Main Topics  . Smoking status: Former Smoker -- 15 years  . Smokeless tobacco: None  . Alcohol Use: Yes  . Drug Use: No  . Sexual Activity: Yes    Birth Control/ Protection: Condom   Other Topics Concern  . 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3 40.0 - 52.0 %   MCV 96.9 80.0 - 100.0 fL   MCH 32.6 26.0 - 34.0 pg   MCHC 33.6 32.0 - 36.0 g/dL   RDW 13.8 11.5 - 14.5 %   Platelets 210 150 - 440 K/uL  Comprehensive metabolic panel     Status: Abnormal   Collection Time: 03/01/15 12:01 AM  Result Value Ref Range   Sodium 137 135 - 145 mmol/L   Potassium 3.7  3.5 - 5.1 mmol/L   Chloride 104 101 - 111 mmol/L   CO2 21 (L) 22 - 32 mmol/L   Glucose, Bld 99 65 - 99 mg/dL   BUN 7 6 - 20 mg/dL   Creatinine, Ser 1.05 0.61 - 1.24 mg/dL   Calcium 8.8 (L) 8.9 - 10.3 mg/dL   Total Protein 8.3 (H) 6.5 - 8.1 g/dL   Albumin 4.6 3.5 - 5.0 g/dL   AST 48 (H) 15 - 41 U/L   ALT 45 17 - 63 U/L   Alkaline Phosphatase 78 38 - 126 U/L   Total Bilirubin 0.5 0.3 - 1.2 mg/dL   GFR calc non Af Amer >60 >60 mL/min   GFR calc Af Amer >60 >60 mL/min    Comment: (NOTE) The eGFR has been calculated using the CKD EPI equation. This calculation has not been validated in all clinical situations. eGFR's persistently <60 mL/min signify possible Chronic Kidney Disease.    Anion gap 12 5 - 15  Urine Drug Screen, Qualitative ([facility] only)     Status: Abnormal   Collection Time: 03/01/15 12:01 AM   Result Value Ref Range   Tricyclic, Ur Screen NOT DETECTED (A) NONE DETECTED   Amphetamines, Ur Screen NOT DETECTED (A) NONE DETECTED   MDMA (Ecstasy)Ur Screen NOT DETECTED (A) NONE DETECTED   Cocaine Metabolite,Ur [place] NOT DETECTED (A) NONE DETECTED   Opiate, Ur Screen NOT DETECTED (A) NONE DETECTED   Phencyclidine (PCP) Ur S NOT DETECTED (A) NONE DETECTED   Cannabinoid 50 Ng, Ur [place] NOT DETECTED (A) NONE DETECTED   Barbiturates, Ur Screen NOT DETECTED (A) NONE DETECTED   Benzodiazepine, Ur Scrn NOT DETECTED (A) NONE DETECTED   Methadone Scn, Ur NOT DETECTED (A) NONE DETECTED    Comment: (NOTE) 694  Tricyclics, urine               Cutoff 1000 ng/mL 200  Amphetamines, urine             Cutoff 1000 ng/mL 300  MDMA (Ecstasy), urine           Cutoff 500 ng/mL 400  Cocaine Metabolite, urine       Cutoff 300 ng/mL 500  Opiate, urine                   Cutoff 300 ng/mL 600  Phencyclidine (PCP), urine      Cutoff 25 ng/mL 700  Cannabinoid, urine              Cutoff 50 ng/mL 800  Barbiturates, urine             Cutoff 200 ng/mL 900  Benzodiazepine, urine           Cutoff 200 ng/mL 1000 Methadone, urine                Cutoff 300 ng/mL 1100 1200 The urine drug screen provides only a preliminary, unconfirmed 1300 analytical test result and should not be used for non-medical 1400 purposes. Clinical consideration and professional judgment should 1500 be applied to any positive drug screen result due to possible 1600 interfering substances. A more specific alternate chemical method 1700 must be used in order to obtain a confirmed analytical result.  1800 Gas chromato graphy / mass spectrometry (GC/MS) is the preferred 1900 confirmatory method.   Ethanol     Status: Abnormal   Collection Time: 03/01/15 12:01 AM  Result Value Ref Range   Alcohol, Ethyl (B) 270 (H) <5 mg/dL  Comment:        LOWEST DETECTABLE LIMIT FOR SERUM ALCOHOL IS 5 mg/dL FOR MEDICAL PURPOSES ONLY   Salicylate level      Status: None   Collection Time: 03/01/15 12:01 AM  Result Value Ref Range   Salicylate Lvl 75.9 2.8 - 30.0 mg/dL  Acetaminophen level     Status: Abnormal   Collection Time: 03/01/15 12:01 AM  Result Value Ref Range   Acetaminophen (Tylenol), Serum <10 (L) 10 - 30 ug/mL    Comment:        THERAPEUTIC CONCENTRATIONS VARY SIGNIFICANTLY. A RANGE OF 10-30 ug/mL MAY BE AN EFFECTIVE CONCENTRATION FOR MANY PATIENTS. HOWEVER, SOME ARE BEST TREATED AT CONCENTRATIONS OUTSIDE THIS RANGE. ACETAMINOPHEN CONCENTRATIONS >150 ug/mL AT 4 HOURS AFTER INGESTION AND >50 ug/mL AT 12 HOURS AFTER INGESTION ARE OFTEN ASSOCIATED WITH TOXIC REACTIONS.   TSH     Status: None   Collection Time: 03/01/15 12:01 AM  Result Value Ref Range   TSH 1.249 0.350 - 4.500 uIU/mL    Vitals: Blood pressure 117/83, pulse 97, temperature 97.6 F (36.4 C), temperature source Oral, resp. rate 16, height '5\' 10"'  (1.778 m), weight 205 lb (92.987 kg), SpO2 98 %.  Risk to Self: Suicidal Ideation: Yes-Currently Present Suicidal Intent: No-Not Currently/Within Last 6 Months Is patient at risk for suicide?: No Suicidal Plan?: Yes-Currently Present Specify Current Suicidal Plan: Good thing I don't have a gun, cause I'd blow my brains out" Access to Means: No Specify Access to Suicidal Means: N/A What has been your use of drugs/alcohol within the last 12 months?: 1 Case of beer 2x/wk How many times?: 1 (pt. reports 1x but states ideation frequently when drinking) Other Self Harm Risks: Unkown Triggers for Past Attempts: Other (Comment), Unpredictable (Alcohol) Intentional Self Injurious Behavior: Cutting Comment - Self Injurious Behavior: Cuts on neck area Risk to Others: Homicidal Ideation: No Thoughts of Harm to Others: No Current Homicidal Intent: No Current Homicidal Plan: No Access to Homicidal Means: No Identified Victim: N/A History of harm to others?:  (UTA) Assessment of Violence: None Noted Violent Behavior  Description: N/A Does patient have access to weapons?: Yes (Comment) (Knife Access) Criminal Charges Pending?: No Does patient have a court date: No Prior Inpatient Therapy: Prior Inpatient Therapy: Yes Prior Therapy Dates:  (Unkown) Prior Therapy Facilty/Provider(s): [facility] Reason for Treatment: SI Prior Outpatient Therapy: Prior Outpatient Therapy: No Prior Therapy Dates: N/A Prior Therapy Facilty/Provider(s): N/A Reason for Treatment: N/A Does patient have an ACCT team?: Unknown Does patient have Intensive In-House Services?  : Unknown Does patient have [facility] services? : Unknown Does patient have P4CC services?: Unknown  Current Facility-Administered Medications  Medication Dose Route Frequency Provider Last Rate Last Dose  . bacitracin ointment   Topical Once Loney Hering, MD   1 application at 16/38/46 0222   Current Outpatient Prescriptions  Medication Sig Dispense Refill  . citalopram (CELEXA) 40 MG tablet Take 40 mg by mouth every morning.    . disulfiram (ANTABUSE) 250 MG tablet Take 250 mg by mouth every morning.    . hydrOXYzine (VISTARIL) 25 MG capsule Take 25 mg by mouth 3 (three) times daily.    Marland Kitchen loratadine (CLARITIN) 10 MG tablet Take 10 mg by mouth every morning.    . traMADol (ULTRAM) 50 MG tablet Take 2 tablets by mouth 3 (three) times daily as needed for moderate pain.     . traZODone (DESYREL) 150 MG tablet Take 150 mg by mouth at bedtime.  Musculoskeletal: Strength & Muscle Tone: within normal limits Gait & Station: normal Patient leans: N/A  Psychiatric Specialty Exam: Physical Exam  Nursing note and vitals reviewed.   ROS  Blood pressure 117/83, pulse 97, temperature 97.6 F (36.4 C), temperature source Oral, resp. rate 16, height '5\' 10"'  (1.778 m), weight 205 lb (92.987 kg), SpO2 98 %.Body mass index is 29.41 kg/(m^2).  General Appearance: Casual  Eye Contact::  Fair  Speech:  Normal Rate  Volume:  Normal  Mood:  Negative  Affect:   Appropriate  Thought Process:  Circumstantial  Orientation:  Full (Time, Place, and Person)  Thought Content:  Negative  Suicidal Thoughts:  No  Homicidal Thoughts:  No  Memory:  Immediate;   Fair Recent;   Fair Remote;   Fair adequate  Judgement:  Fair  Insight:  Fair  Psychomotor Activity:  Normal  Concentration:  Fair  Recall:  [employer] [place]  Language: Fair  Akathisia:  No  Handed:  Right  AIMS (if indicated):     Assets:  Communication Skills Desire for Improvement Financial Resources/Insurance Housing Social Support Transportation Vocational/Educational  ADL's:  Intact  Cognition: WNL  Sleep:      Medical Decision Making: Established Problem, Stable/Improving (1)  Treatment Plan Summary: Plan D/C IVC and discharge pt home with wife and pt has enough meds at home  Plan:  No evidence of imminent risk to self or others at present.   Disposition: as above  Evony Rezek K 03/01/2015 4:21 PM

## 2015-03-01 NOTE — ED Notes (Signed)
Pt given dinner tray. No acute distress noted at this time. Pt in room with lights and TV off.

## 2015-03-01 NOTE — ED Notes (Addendum)
ENVIRONMENTAL ASSESSMENT  Potentially harmful objects out of patient reach: Yes.  Personal belongings secured: Yes.  Patient dressed in hospital provided attire only: Yes.  Plastic bags out of patient reach: Yes.  Patient care equipment (cords, cables, call bells, lines, and drains) shortened, removed, or accounted for: Yes.  Equipment and supplies removed from bottom of stretcher: Yes.  Potentially toxic materials out of patient reach: Yes.  Sharps container removed or out of patient reach: Yes.   BEHAVIORAL HEALTH ROUNDING Patient sleeping: Yes.   Patient alert and oriented: not applicable SLEEPING Behavior appropriate: Yes.  ; If no, describe: SLEEPING Nutrition and fluids offered: No SLEEPING Toileting and hygiene offered: NoSLEEPING Sitter present: not applicable Law enforcement present: Yes ODS 

## 2015-03-01 NOTE — ED Provider Notes (Signed)
-----------------------------------------   7:17 AM on 03/01/2015 -----------------------------------------   Blood pressure 103/89, pulse 99, temperature 98.4 F (36.9 C), temperature source Oral, resp. rate 16, height 5\' 10"  (1.778 m), weight 205 lb (92.987 kg), SpO2 96 %.  The patient had no acute events since last update.  Calm and cooperative at this time.  Disposition is pending per Psychiatry/Behavioral Medicine team recommendations.     Daymon Larsen, MD 03/01/15 [phone]

## 2015-03-01 NOTE — ED Notes (Signed)
Pt visualized in no acute distress. Pt laying in bed with lights and TV off. Respirations even and unlabored.

## 2015-03-01 NOTE — ED Notes (Signed)
BEHAVIORAL HEALTH ROUNDING Patient sleeping: No. Patient alert and oriented: yes Behavior appropriate: Yes.  ; If no, describe:  Nutrition and fluids offered: Yes  Toileting and hygiene offered: Yes  Sitter present: not applicable Law enforcement present: Yes  

## 2015-03-01 NOTE — ED Notes (Signed)
Patient is resting comfortably. 

## 2015-03-01 NOTE — ED Notes (Signed)
BEHAVIORAL HEALTH ROUNDING Patient sleeping: Yes.   Patient alert and oriented: yes Behavior appropriate: Yes.  ; If no, describe:  Nutrition and fluids offered: Yes  Toileting and hygiene offered: Yes  Sitter present: yes Law enforcement present: Yes ODS  

## 2015-03-01 NOTE — ED Notes (Signed)
pts urine sent to lab.  Urine had blood flakes in specimen possibly due to dried blood on pts chest.

## 2015-03-01 NOTE — ED Notes (Signed)
Pt escorted to [place] by PO and RN.

## 2015-03-01 NOTE — ED Provider Notes (Signed)
Patient has been cleared from psychiatry standpoint. Patient was discharged from emergency department. Please see psychiatric note.  Daymon Larsen, MD 03/01/15 [phone]

## 2015-03-06 ENCOUNTER — Emergency Department
Admission: EM | Admit: 2015-03-06 | Discharge: 2015-03-06 | Disposition: A | Discharge status: Home / Self Care | Attending: Emergency Medicine | Admitting: Emergency Medicine

## 2015-03-06 DIAGNOSIS — I10 Essential (primary) hypertension: Secondary | ICD-10-CM | POA: Diagnosis present

## 2015-03-06 DIAGNOSIS — Z87891 Personal history of nicotine dependence: Secondary | ICD-10-CM | POA: Diagnosis not present

## 2015-03-06 DIAGNOSIS — F1994 Other psychoactive substance use, unspecified with psychoactive substance-induced mood disorder: Secondary | ICD-10-CM | POA: Diagnosis not present

## 2015-03-06 DIAGNOSIS — F10120 Alcohol abuse with intoxication, uncomplicated: Secondary | ICD-10-CM | POA: Diagnosis not present

## 2015-03-06 DIAGNOSIS — S31114A Laceration without foreign body of abdominal wall, left lower quadrant without penetration into peritoneal cavity, initial encounter: Secondary | ICD-10-CM | POA: Diagnosis not present

## 2015-03-06 DIAGNOSIS — F10920 Alcohol use, unspecified with intoxication, uncomplicated: Secondary | ICD-10-CM

## 2015-03-06 DIAGNOSIS — Y9289 Other specified places as the place of occurrence of the external cause: Secondary | ICD-10-CM | POA: Diagnosis not present

## 2015-03-06 DIAGNOSIS — F1024 Alcohol dependence with alcohol-induced mood disorder: Secondary | ICD-10-CM | POA: Diagnosis not present

## 2015-03-06 DIAGNOSIS — Z79899 Other long term (current) drug therapy: Secondary | ICD-10-CM | POA: Diagnosis not present

## 2015-03-06 DIAGNOSIS — Y998 Other external cause status: Secondary | ICD-10-CM | POA: Insufficient documentation

## 2015-03-06 DIAGNOSIS — Y281XXA Contact with knife, undetermined intent, initial encounter: Secondary | ICD-10-CM | POA: Diagnosis not present

## 2015-03-06 DIAGNOSIS — R45851 Suicidal ideations: Secondary | ICD-10-CM | POA: Diagnosis present

## 2015-03-06 DIAGNOSIS — Y9389 Activity, other specified: Secondary | ICD-10-CM | POA: Diagnosis not present

## 2015-03-06 DIAGNOSIS — F323 Major depressive disorder, single episode, severe with psychotic features: Secondary | ICD-10-CM | POA: Insufficient documentation

## 2015-03-06 LAB — URINE DRUG SCREEN, QUALITATIVE (ARMC ONLY)
AMPHETAMINES, UR SCREEN: NOT DETECTED
BARBITURATES, UR SCREEN: NOT DETECTED
BENZODIAZEPINE, UR SCRN: NOT DETECTED
Cannabinoid 50 Ng, Ur ~~LOC~~: NOT DETECTED
Cocaine Metabolite,Ur ~~LOC~~: NOT DETECTED
MDMA (Ecstasy)Ur Screen: NOT DETECTED
METHADONE SCREEN, URINE: NOT DETECTED
OPIATE, UR SCREEN: NOT DETECTED
Phencyclidine (PCP) Ur S: NOT DETECTED
Tricyclic, Ur Screen: NOT DETECTED

## 2015-03-06 LAB — COMPREHENSIVE METABOLIC PANEL
ALBUMIN: 4.4 g/dL (ref 3.5–5.0)
ALK PHOS: 82 U/L (ref 38–126)
ALT: 43 U/L (ref 17–63)
ANION GAP: 10 (ref 5–15)
AST: 38 U/L (ref 15–41)
BILIRUBIN TOTAL: 0.4 mg/dL (ref 0.3–1.2)
BUN: 9 mg/dL (ref 6–20)
CALCIUM: 8.8 mg/dL — AB (ref 8.9–10.3)
CO2: 21 mmol/L — ABNORMAL LOW (ref 22–32)
Chloride: 109 mmol/L (ref 101–111)
Creatinine, Ser: 0.88 mg/dL (ref 0.61–1.24)
Glucose, Bld: 105 mg/dL — ABNORMAL HIGH (ref 65–99)
POTASSIUM: 3.6 mmol/L (ref 3.5–5.1)
Sodium: 140 mmol/L (ref 135–145)
TOTAL PROTEIN: 8 g/dL (ref 6.5–8.1)

## 2015-03-06 LAB — CBC
HEMATOCRIT: 46 % (ref 40.0–52.0)
Hemoglobin: 15.4 g/dL (ref 13.0–18.0)
MCH: 32.7 pg (ref 26.0–34.0)
MCHC: 33.6 g/dL (ref 32.0–36.0)
MCV: 97.4 fL (ref 80.0–100.0)
PLATELETS: 202 10*3/uL (ref 150–440)
RBC: 4.72 MIL/uL (ref 4.40–5.90)
RDW: 13.5 % (ref 11.5–14.5)
WBC: 10.4 10*3/uL (ref 3.8–10.6)

## 2015-03-06 LAB — ACETAMINOPHEN LEVEL

## 2015-03-06 LAB — SALICYLATE LEVEL

## 2015-03-06 LAB — ETHANOL: ALCOHOL ETHYL (B): 214 mg/dL — AB (ref <5)

## 2015-03-06 NOTE — ED Notes (Signed)
Pt. Noted in room sleeping. No distress or abnormal behavior noted. Will continue to monitor. Q 15 minute rounds continue.

## 2015-03-06 NOTE — ED Notes (Signed)
Pt. Alert and oriented, warm and dry, in no distress. Pt. Denies current feelings of SI. Pt. Encouraged to let nursing staff know of any concerns or needs.

## 2015-03-06 NOTE — ED Notes (Signed)
BEHAVIORAL HEALTH ROUNDING Patient sleeping: Yes.   Patient alert and oriented: yes Behavior appropriate: Yes.  ; If no, describe:  Nutrition and fluids offered: Yes  Toileting and hygiene offered: Yes  Sitter present: no Law enforcement present: Yes  

## 2015-03-06 NOTE — ED Notes (Signed)
Transfer to BHU  

## 2015-03-06 NOTE — ED Provider Notes (Signed)
[facility] Emergency Department Provider Note  ____________________________________________  Time seen: Approximately 7:38 AM  I have reviewed the triage vital signs and the nursing notes.   HISTORY  Chief Complaint Suicidal    HPI Jesse Obrien is a 54 y.o. male reports that he was straining heavily last 2 days, but 18 beers a day and that yesterday and the day before he began himself again. She reports is a severe drinking problem, when he is drunk he wants to kill himself. States he's drunk right now he drank heavily last nightagain. Denies a history of, get withdrawals or seizures.  He denies overdose. He reports that he attempted to slice himself in the abdomen again, he also wanted to reopen his old neck wound.  He denies taking any medications. Denies any attempt at overdose or other self-harm.  No pain.  Previous history of major alcoholism. Major depression.  Past Medical History  Diagnosis Date  . Hypertension 01/02/2015    Patient Active Problem List   Diagnosis Date Noted  . Substance induced mood disorder 03/06/2015  . Alcohol intoxication   . 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 Alcohol dependence 01/02/2015  . Laceration 01/02/2015  . Hypertension 01/02/2015    History reviewed. No pertinent past surgical history.  Current Outpatient Rx  Name  Route  Sig  Dispense  Refill  . citalopram (CELEXA) 40 MG tablet   Oral   Take 40 mg by mouth every morning.         . disulfiram (ANTABUSE) 250 MG tablet   Oral   Take 250 mg by mouth every morning.         . hydrOXYzine (VISTARIL) 25 MG capsule   Oral   Take 25 mg by mouth 3 (three) times daily.         Marland Kitchen loratadine (CLARITIN) 10 MG tablet   Oral   Take 10 mg by mouth every morning.         . traMADol (ULTRAM) 50 MG tablet   Oral   Take 2 tablets by mouth 3 (three) times daily as needed for moderate pain.           . traZODone (DESYREL) 150 MG tablet   Oral   Take 150 mg by mouth at bedtime.           Allergies Review of patient's allergies indicates no known allergies.  No family history on file.  Social History Social History  Substance Use Topics  . Smoking status: Former Smoker -- 15 years  . Smokeless tobacco: None  . Alcohol Use: Yes    Review of Systems Constitutional: No fever/chills Eyes: No visual changes. ENT: No sore throat. Cardiovascular: Denies chest pain. Respiratory: Denies shortness of breath. Gastrointestinal: No abdominal pain.  No nausea, no vomiting.  No diarrhea.  No constipation. Genitourinary: Negative for dysuria. Musculoskeletal: Negative for back pain. Skin: Negative for rash Cuts over his left lower abdomen. Neurological: Negative for headaches, focal weakness or numbness.  10-point ROS otherwise negative.  ____________________________________________   PHYSICAL EXAM:  VITAL SIGNS: ED Triage Vitals  Enc Vitals Group     BP 03/06/15 0709 163/95 mmHg     Pulse Rate 03/06/15 0709 107     Resp 03/06/15 0709 18     Temp 03/06/15 0709 98.5 F (36.9 C)     Temp Source 03/06/15 0709 Oral     SpO2 03/06/15 0709 100 %  Weight 03/06/15 0709 205 lb (92.987 kg)     Height 03/06/15 0709 5\' 11"  (1.803 m)     Head Cir --      Peak Flow --      Pain Score 03/06/15 0709 3     Pain Loc --      Pain Edu? --      Excl. in [place]? --     Constitutional: Alert and oriented. He is rather agitated, somewhat upset  Eyes: Conjunctivae are normal. PERRL. EOMI. Head: Atraumatic. Nose: No congestion/rhinnorhea. Mouth/Throat: Mucous membranes are moist.  Oropharynx non-erythematous. Neck: No stridor.  Patient has an old healing linear wound along the anterior neck, no evidence of new injury or extending infection. Cardiovascular: Normal rate, regular rhythm. Grossly normal heart sounds.  Good peripheral circulation. Respiratory: Normal respiratory effort.  No  retractions. Lungs CTAB. Gastrointestinal: Soft and nontender. No distention. No abdominal bruits. No CVA tenderness. Patient has an area about the size of a dollar bill where he has for multiple superficial lacerations over the left lower quadrant, no erythema, no active bleeding, no purulence, no signs of active infection. Musculoskeletal: No lower extremity tenderness nor edema.  No joint effusions. Neurologic:  Slightly slurred speech and language. No gross focal neurologic deficits are appreciated.  Skin:  Skin is warm, dry and intact. No rash noted. Psychiatric: Mood and affect are normal. Speech and behavior are normal.  Reports up-to-date tetanus ____________________________________________   LABS (all labs ordered are listed, but only abnormal results are displayed)  Labs Reviewed  COMPREHENSIVE METABOLIC PANEL - Abnormal; Notable for the following:    CO2 21 (*)    Glucose, Bld 105 (*)    Calcium 8.8 (*)    All other components within normal limits  ETHANOL - Abnormal; Notable for the following:    Alcohol, Ethyl (B) 214 (*)    All other components within normal limits  ACETAMINOPHEN LEVEL - Abnormal; Notable for the following:    Acetaminophen (Tylenol), Serum <10 (*)    All other components within normal limits  SALICYLATE LEVEL  CBC  URINE DRUG SCREEN, QUALITATIVE ([facility] ONLY)   ____________________________________________  EKG   ____________________________________________  RADIOLOGY   ____________________________________________   PROCEDURES  Procedure(s) performed: None  Critical Care performed: No  ____________________________________________   INITIAL IMPRESSION / ASSESSMENT AND PLAN / ED COURSE  Pertinent labs & imaging results that were available during my care of the patient were reviewed by me and considered in my medical decision making (see chart for details).  Jesse Obrien unfortunate gentleman, he is a very long history of severe  alcoholism who seems to be inducing him to cause self injury.  Self-inflicted injuries to the left abdomen. No evidence of acute infection, no present indication for antibiotic's. Wounds to be cleansed by RN. Patient does report suicidal ideation, also in the setting of recurrent healthy alcohol abuse. I placed him under involuntary commitment left psychiatry see him again.  Ongoing care and disposition assigned Dr. Jimmye Norman    ____________________________________________   FINAL CLINICAL IMPRESSION(S) / ED DIAGNOSES  Final diagnoses:  Alcohol intoxication, uncomplicated  Major depressive disorder, single episode, severe with psychotic features      Delman Kitten, MD 03/06/15 1528

## 2015-03-06 NOTE — ED Notes (Signed)

## 2015-03-06 NOTE — ED Notes (Signed)
Pt states "I want to [place] myself"..states he has attempted suicide by cutting his own throat and abd in the past week or two.Marland Kitchen

## 2015-03-06 NOTE — Discharge Instructions (Signed)
Alcohol Use Disorder Alcohol use disorder is a mental disorder. It is not a one-time incident of heavy drinking. Alcohol use disorder is the excessive and uncontrollable use of alcohol over time that leads to problems with functioning in one or more areas of daily living. People with this disorder risk harming themselves and others when they drink to excess. Alcohol use disorder also can cause other mental disorders, such as mood and anxiety disorders, and serious physical problems. People with alcohol use disorder often misuse other drugs.  Alcohol use disorder is common and widespread. Some people with this disorder drink alcohol to cope with or escape from negative life events. Others drink to relieve chronic pain or symptoms of mental illness. People with a family history of alcohol use disorder are at higher risk of losing control and using alcohol to excess.  SYMPTOMS  Signs and symptoms of alcohol use disorder may include the following:   Consumption ofalcohol inlarger amounts or over a longer period of time than intended.  Multiple unsuccessful attempts to cutdown or control alcohol use.   A great deal of time spent obtaining alcohol, using alcohol, or recovering from the effects of alcohol (hangover).  A strong desire or urge to use alcohol (cravings).   Continued use of alcohol despite problems at work, school, or home because of alcohol use.   Continued use of alcohol despite problems in relationships because of alcohol use.  Continued use of alcohol in situations when it is physically hazardous, such as driving a car.  Continued use of alcohol despite awareness of a physical or psychological problem that is likely related to alcohol use. Physical problems related to alcohol use can involve the brain, heart, liver, stomach, and intestines. Psychological problems related to alcohol use include intoxication, depression, anxiety, psychosis, delirium, and dementia.   The need for  increased amounts of alcohol to achieve the same desired effect, or a decreased effect from the consumption of the same amount of alcohol (tolerance).  Withdrawal symptoms upon reducing or stopping alcohol use, or alcohol use to reduce or avoid withdrawal symptoms. Withdrawal symptoms include:  Racing heart.  Hand tremor.  Difficulty sleeping.  Nausea.  Vomiting.  Hallucinations.  Restlessness.  Seizures. DIAGNOSIS Alcohol use disorder is diagnosed through an assessment by your health care provider. Your health care provider may start by asking three or four questions to screen for excessive or problematic alcohol use. To confirm a diagnosis of alcohol use disorder, at least two symptoms must be present within a 12-month period. The severity of alcohol use disorder depends on the number of symptoms:  Mild--two or three.  Moderate--four or five.  Severe--six or more. Your health care provider may perform a physical exam or use results from lab tests to see if you have physical problems resulting from alcohol use. Your health care provider may refer you to a mental health professional for evaluation. TREATMENT  Some people with alcohol use disorder are able to reduce their alcohol use to low-risk levels. Some people with alcohol use disorder need to quit drinking alcohol. When necessary, mental health professionals with specialized training in substance use treatment can help. Your health care provider can help you decide how severe your alcohol use disorder is and what type of treatment you need. The following forms of treatment are available:   Detoxification. Detoxification involves the use of prescription medicines to prevent alcohol withdrawal symptoms in the first week after quitting. This is important for people with a history of symptoms   of withdrawal and for heavy drinkers who are likely to have withdrawal symptoms. Alcohol withdrawal can be dangerous and, in severe cases, cause  death. Detoxification is usually provided in a hospital or in-patient substance use treatment facility.  Counseling or talk therapy. Talk therapy is provided by substance use treatment counselors. It addresses the reasons people use alcohol and ways to keep them from drinking again. The goals of talk therapy are to help people with alcohol use disorder find healthy activities and ways to cope with life stress, to identify and avoid triggers for alcohol use, and to handle cravings, which can cause relapse.  Medicines.Different medicines can help treat alcohol use disorder through the following actions:  Decrease alcohol cravings.  Decrease the positive reward response felt from alcohol use.  Produce an uncomfortable physical reaction when alcohol is used (aversion therapy).  Support groups. Support groups are run by people who have quit drinking. They provide emotional support, advice, and guidance. These forms of treatment are often combined. Some people with alcohol use disorder benefit from intensive combination treatment provided by specialized substance use treatment centers. Both inpatient and outpatient treatment programs are available. Document Released: 07/28/2004 Document Revised: 11/04/2013 Document Reviewed: 09/27/2012 Exit[employer] Patient Information 2015 [employer]. This information is not intended to replace advice given to you by your health care provider. Make sure you discuss any questions you have with your health care provider.  

## 2015-03-06 NOTE — ED Notes (Signed)
Pt. Noted in room. sleeping. No distress or abnormal behavior noted. Will continue to monitor. Q 15 minute rounds continue.
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
## 2015-03-06 NOTE — ED Notes (Signed)
Pt. Noted in room. Pt sleeping . No distress or abnormal behavior noted. Will continue to monitor. Q 15 minute rounds continue.

## 2015-03-06 NOTE — ED Provider Notes (Signed)
She has been cleared by psychiatry for discharge. Involuntary commitment has been overturned. Stable for outpatient follow-up.  Earleen Newport, MD 03/06/15 [phone]

## 2015-03-06 NOTE — ED Notes (Signed)
Report given to [employer] in the [place] , pt escorted to [employer] via [employer] , and [employer] officer

## 2015-03-06 NOTE — ED Notes (Signed)
Pt transferred to [facility] by officer and tech. Greeted pt, who stated that he just wants to be left alone. Would not answer questions. Pt in room at this time.
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
## 2015-03-06 NOTE — ED Notes (Signed)
Lunch tray given. 

## 2015-03-19 ENCOUNTER — Inpatient Hospital Stay
Admission: EM | Admit: 2015-03-19 | Discharge: 2015-03-24 | DRG: 885 | Disposition: A | Discharge status: Home / Self Care | Attending: Psychiatry | Admitting: Psychiatry

## 2015-03-19 ENCOUNTER — Emergency Department
Admission: EM | Admit: 2015-03-19 | Discharge: 2015-03-19 | Disposition: A | Discharge status: Other Acute Inpatient Hospital | Attending: Emergency Medicine | Admitting: Emergency Medicine

## 2015-03-19 ENCOUNTER — Encounter: Payer: Self-pay | Admitting: Emergency Medicine

## 2015-03-19 DIAGNOSIS — Y9289 Other specified places as the place of occurrence of the external cause: Secondary | ICD-10-CM | POA: Diagnosis not present

## 2015-03-19 DIAGNOSIS — Z630 Problems in relationship with spouse or partner: Secondary | ICD-10-CM | POA: Diagnosis not present

## 2015-03-19 DIAGNOSIS — F10120 Alcohol abuse with intoxication, uncomplicated: Secondary | ICD-10-CM | POA: Diagnosis not present

## 2015-03-19 DIAGNOSIS — Y9389 Activity, other specified: Secondary | ICD-10-CM | POA: Diagnosis not present

## 2015-03-19 DIAGNOSIS — F332 Major depressive disorder, recurrent severe without psychotic features: Secondary | ICD-10-CM | POA: Diagnosis present

## 2015-03-19 DIAGNOSIS — X788XXA Intentional self-harm by other sharp object, initial encounter: Secondary | ICD-10-CM | POA: Diagnosis not present

## 2015-03-19 DIAGNOSIS — G4700 Insomnia, unspecified: Secondary | ICD-10-CM | POA: Diagnosis present

## 2015-03-19 DIAGNOSIS — IMO0002 Reserved for concepts with insufficient information to code with codable children: Secondary | ICD-10-CM

## 2015-03-19 DIAGNOSIS — X838XXA Intentional self-harm by other specified means, initial encounter: Secondary | ICD-10-CM

## 2015-03-19 DIAGNOSIS — F10920 Alcohol use, unspecified with intoxication, uncomplicated: Secondary | ICD-10-CM

## 2015-03-19 DIAGNOSIS — Z87891 Personal history of nicotine dependence: Secondary | ICD-10-CM | POA: Diagnosis not present

## 2015-03-19 DIAGNOSIS — M545 Low back pain: Secondary | ICD-10-CM | POA: Diagnosis present

## 2015-03-19 DIAGNOSIS — G8929 Other chronic pain: Secondary | ICD-10-CM | POA: Diagnosis present

## 2015-03-19 DIAGNOSIS — M5136 Other intervertebral disc degeneration, lumbar region: Secondary | ICD-10-CM

## 2015-03-19 DIAGNOSIS — I10 Essential (primary) hypertension: Secondary | ICD-10-CM | POA: Diagnosis not present

## 2015-03-19 DIAGNOSIS — Z79899 Other long term (current) drug therapy: Secondary | ICD-10-CM | POA: Diagnosis not present

## 2015-03-19 DIAGNOSIS — Z915 Personal history of self-harm: Secondary | ICD-10-CM

## 2015-03-19 DIAGNOSIS — F10939 Alcohol use, unspecified with withdrawal, unspecified: Secondary | ICD-10-CM | POA: Diagnosis present

## 2015-03-19 DIAGNOSIS — M4806 Spinal stenosis, lumbar region: Secondary | ICD-10-CM | POA: Diagnosis present

## 2015-03-19 DIAGNOSIS — F17210 Nicotine dependence, cigarettes, uncomplicated: Secondary | ICD-10-CM | POA: Diagnosis present

## 2015-03-19 DIAGNOSIS — Y998 Other external cause status: Secondary | ICD-10-CM | POA: Insufficient documentation

## 2015-03-19 DIAGNOSIS — F515 Nightmare disorder: Secondary | ICD-10-CM | POA: Diagnosis present

## 2015-03-19 DIAGNOSIS — Z7289 Other problems related to lifestyle: Secondary | ICD-10-CM

## 2015-03-19 DIAGNOSIS — M179 Osteoarthritis of knee, unspecified: Secondary | ICD-10-CM | POA: Diagnosis present

## 2015-03-19 DIAGNOSIS — F333 Major depressive disorder, recurrent, severe with psychotic symptoms: Secondary | ICD-10-CM | POA: Diagnosis present

## 2015-03-19 DIAGNOSIS — L089 Local infection of the skin and subcutaneous tissue, unspecified: Secondary | ICD-10-CM

## 2015-03-19 DIAGNOSIS — K219 Gastro-esophageal reflux disease without esophagitis: Secondary | ICD-10-CM | POA: Diagnosis present

## 2015-03-19 DIAGNOSIS — F10230 Alcohol dependence with withdrawal, uncomplicated: Secondary | ICD-10-CM | POA: Diagnosis present

## 2015-03-19 DIAGNOSIS — F10239 Alcohol dependence with withdrawal, unspecified: Secondary | ICD-10-CM | POA: Diagnosis present

## 2015-03-19 DIAGNOSIS — T148 Other injury of unspecified body region: Secondary | ICD-10-CM | POA: Diagnosis not present

## 2015-03-19 DIAGNOSIS — S1191XA Laceration without foreign body of unspecified part of neck, initial encounter: Secondary | ICD-10-CM | POA: Diagnosis present

## 2015-03-19 DIAGNOSIS — M48061 Spinal stenosis, lumbar region without neurogenic claudication: Secondary | ICD-10-CM

## 2015-03-19 DIAGNOSIS — F1020 Alcohol dependence, uncomplicated: Secondary | ICD-10-CM | POA: Diagnosis present

## 2015-03-19 DIAGNOSIS — M1710 Unilateral primary osteoarthritis, unspecified knee: Secondary | ICD-10-CM

## 2015-03-19 LAB — CBC
HEMATOCRIT: 41.3 % (ref 40.0–52.0)
HEMOGLOBIN: 14.3 g/dL (ref 13.0–18.0)
MCH: 33.2 pg (ref 26.0–34.0)
MCHC: 34.5 g/dL (ref 32.0–36.0)
MCV: 96.2 fL (ref 80.0–100.0)
Platelets: 178 10*3/uL (ref 150–440)
RBC: 4.3 MIL/uL — AB (ref 4.40–5.90)
RDW: 13.6 % (ref 11.5–14.5)
WBC: 9.9 10*3/uL (ref 3.8–10.6)

## 2015-03-19 LAB — COMPREHENSIVE METABOLIC PANEL
ALBUMIN: 3.9 g/dL (ref 3.5–5.0)
ALK PHOS: 70 U/L (ref 38–126)
ALT: 47 U/L (ref 17–63)
ANION GAP: 10 (ref 5–15)
AST: 45 U/L — ABNORMAL HIGH (ref 15–41)
BUN: 8 mg/dL (ref 6–20)
CALCIUM: 8 mg/dL — AB (ref 8.9–10.3)
CHLORIDE: 104 mmol/L (ref 101–111)
CO2: 22 mmol/L (ref 22–32)
Creatinine, Ser: 0.93 mg/dL (ref 0.61–1.24)
GFR calc non Af Amer: >60 mL/min (ref >60)
GLUCOSE: 97 mg/dL (ref 65–99)
Potassium: 3.3 mmol/L — ABNORMAL LOW (ref 3.5–5.1)
SODIUM: 136 mmol/L (ref 135–145)
Total Bilirubin: 0.5 mg/dL (ref 0.3–1.2)
Total Protein: 7.2 g/dL (ref 6.5–8.1)

## 2015-03-19 LAB — TSH: TSH: 0.607 u[IU]/mL (ref 0.350–4.500)

## 2015-03-19 LAB — ETHANOL: Alcohol, Ethyl (B): 256 mg/dL — ABNORMAL HIGH (ref <5)

## 2015-03-19 LAB — URINE DRUG SCREEN, QUALITATIVE (ARMC ONLY)
AMPHETAMINES, UR SCREEN: NOT DETECTED
BARBITURATES, UR SCREEN: NOT DETECTED
Benzodiazepine, Ur Scrn: NOT DETECTED
COCAINE METABOLITE, UR ~~LOC~~: NOT DETECTED
Cannabinoid 50 Ng, Ur ~~LOC~~: NOT DETECTED
MDMA (ECSTASY) UR SCREEN: NOT DETECTED
METHADONE SCREEN, URINE: NOT DETECTED
OPIATE, UR SCREEN: NOT DETECTED
Phencyclidine (PCP) Ur S: NOT DETECTED
TRICYCLIC, UR SCREEN: NOT DETECTED

## 2015-03-19 LAB — SALICYLATE LEVEL
SALICYLATE LVL: 15 mg/dL (ref 2.8–30.0)
Salicylate Lvl: 11.5 mg/dL (ref 2.8–30.0)

## 2015-03-19 LAB — ACETAMINOPHEN LEVEL

## 2015-03-19 MED ORDER — VITAMIN B-1 100 MG PO TABS
100.0000 mg | ORAL_TABLET | Freq: Every day | ORAL | Status: DC
  Administered 2015-03-19: 100 mg via ORAL
  Filled 2015-03-19: qty 1

## 2015-03-19 MED ORDER — ACETAMINOPHEN 325 MG PO TABS: 650.0000 mg | ORAL_TABLET | Freq: Four times a day (QID) | ORAL | Status: DC | PRN

## 2015-03-19 MED ORDER — VITAMIN B-1 100 MG PO TABS
100.0000 mg | ORAL_TABLET | Freq: Every day | ORAL | Status: DC
  Filled 2015-03-19: qty 1

## 2015-03-19 MED ORDER — LORAZEPAM 2 MG PO TABS
0.0000 mg | ORAL_TABLET | Freq: Four times a day (QID) | ORAL | Status: DC
  Administered 2015-03-19: 2 mg via ORAL
  Filled 2015-03-19: qty 1

## 2015-03-19 MED ORDER — CITALOPRAM HYDROBROMIDE 20 MG PO TABS
40.0000 mg | ORAL_TABLET | Freq: Every day | ORAL | Status: DC
  Administered 2015-03-20: 40 mg via ORAL
  Filled 2015-03-19: qty 2

## 2015-03-19 MED ORDER — LORAZEPAM 2 MG PO TABS: 0.0000 mg | ORAL_TABLET | Freq: Two times a day (BID) | ORAL | Status: DC

## 2015-03-19 MED ORDER — LORAZEPAM 2 MG PO TABS
0.0000 mg | ORAL_TABLET | Freq: Four times a day (QID) | ORAL | Status: DC
  Administered 2015-03-19: 1 mg via ORAL

## 2015-03-19 MED ORDER — LORAZEPAM 1 MG PO TABS: 1.0000 mg | ORAL_TABLET | Freq: Four times a day (QID) | ORAL | Status: DC | PRN

## 2015-03-19 MED ORDER — SODIUM CHLORIDE 0.9 % IV BOLUS (SEPSIS)
1000.0000 mL | Freq: Once | INTRAVENOUS | Status: AC
  Administered 2015-03-19: 1000 mL via INTRAVENOUS

## 2015-03-19 MED ORDER — THIAMINE HCL 100 MG/ML IJ SOLN
100.0000 mg | Freq: Every day | INTRAMUSCULAR | Status: DC
Start: 2015-03-20 — End: 2015-03-20

## 2015-03-19 MED ORDER — TRAZODONE HCL 50 MG PO TABS
50.0000 mg | ORAL_TABLET | Freq: Every day | ORAL | Status: DC
  Administered 2015-03-19: 50 mg via ORAL
  Filled 2015-03-19: qty 1

## 2015-03-19 MED ORDER — THIAMINE HCL 100 MG/ML IJ SOLN: 100.0000 mg | Freq: Every day | INTRAMUSCULAR | Status: DC

## 2015-03-19 MED ORDER — ADULT MULTIVITAMIN W/MINERALS CH
1.0000 | ORAL_TABLET | Freq: Every day | ORAL | Status: DC
  Administered 2015-03-20 – 2015-03-23 (×4): 1 via ORAL
  Filled 2015-03-19 (×4): qty 1

## 2015-03-19 MED ORDER — LORAZEPAM 2 MG/ML IJ SOLN: 1.0000 mg | Freq: Four times a day (QID) | INTRAMUSCULAR | Status: DC | PRN

## 2015-03-19 MED ORDER — ALUM & MAG HYDROXIDE-SIMETH 200-200-20 MG/5ML PO SUSP: 30.0000 mL | ORAL | Status: DC | PRN

## 2015-03-19 MED ORDER — FOLIC ACID 1 MG PO TABS
1.0000 mg | ORAL_TABLET | Freq: Every day | ORAL | Status: DC
  Administered 2015-03-20 – 2015-03-23 (×4): 1 mg via ORAL
  Filled 2015-03-19 (×4): qty 1

## 2015-03-19 MED ORDER — CITALOPRAM HYDROBROMIDE 20 MG PO TABS
40.0000 mg | ORAL_TABLET | Freq: Every day | ORAL | Status: DC
  Administered 2015-03-19: 40 mg via ORAL
  Filled 2015-03-19: qty 2

## 2015-03-19 MED ORDER — MAGNESIUM HYDROXIDE 400 MG/5ML PO SUSP: 30.0000 mL | Freq: Every day | ORAL | Status: DC | PRN

## 2015-03-19 MED ORDER — LORAZEPAM 1 MG PO TABS
1.0000 mg | ORAL_TABLET | Freq: Four times a day (QID) | ORAL | Status: DC | PRN
  Administered 2015-03-19: 1 mg via ORAL
  Filled 2015-03-19: qty 1

## 2015-03-19 MED ORDER — ADULT MULTIVITAMIN W/MINERALS CH
1.0000 | ORAL_TABLET | Freq: Every day | ORAL | Status: DC
  Filled 2015-03-19: qty 1

## 2015-03-19 MED ORDER — TRAZODONE HCL 50 MG PO TABS: 50.0000 mg | ORAL_TABLET | Freq: Every day | ORAL | Status: DC

## 2015-03-19 MED ORDER — FOLIC ACID 1 MG PO TABS
1.0000 mg | ORAL_TABLET | Freq: Every day | ORAL | Status: DC
  Administered 2015-03-19: 1 mg via ORAL
  Filled 2015-03-19: qty 1

## 2015-03-19 NOTE — Consult Note (Signed)
[facility] Consult   Reason for Consult:  Consult for this 54 year old man with alcohol abuse and depression who comes in to the hospital after cutting himself on the neck Referring Physician:  Corky Downs Patient Identification: Jesse Obrien MRN:  [id number] Principal Diagnosis: Severe recurrent major depression without psychotic features Diagnosis:   Patient Active Problem List   Diagnosis Date Noted  . Severe recurrent major depression without psychotic features [F33.2] 03/19/2015  . Substance induced mood disorder [F19.94] 03/06/2015  . Alcohol intoxication [F10.129]   . 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 Alcohol dependence [F10.20] 01/02/2015  . Laceration [T14.8] 01/02/2015  . Hypertension [I10] 01/02/2015    Total Time spent with patient: 1 hour  Subjective:   Jesse Obrien is a 54 y.o. male patient admitted with "yes I cut myself".  HPI:  Information from the patient and the chart. Patient brought into the hospital after cutting himself on the neck with a household knife. He was intoxicated at the time. He says that he does it because he feels so sad and negative about himself. He is somewhat vague about how much she actually wants to die at the time. He has been back to drinking as much as a case of beer a day. Blood alcohol level on admission was between 2 and 300. Mood stays sad and depressed most of the time. Sleep is chronically impaired and chaotic in part related to his alcohol abuse. He has chronic pain complaints as well. Major stresses are his chronic pain complaints and his depression which is not getting better as well as his ongoing alcohol use.  Past psychiatric history: Patient has tried to kill himself by self mutilation multiple times in the past. Sometimes he only cuts his arm but he has cut himself on the neck several times before. He is aware that this is potentially extremely dangerous  and has continued to do it. He tends to be avoidant and noncompliant with discussion once he sobers up. He is being treated with citalopram. Questionable compliance. Writer admissions.  Medical history: Complains of chronic aches and pains. Also chronic depression. Doesn't seem to be very compliant with outpatient medical treatment as well.  Social history: Patient is retired. I believe he is a veteran but he does not go to get treatment through the [place]. Lives at home. He does have family around but it doesn't sound like he's very emotionally invested with the most of the time.  Family history: None stated  Substance abuse history: Long history of alcohol abuse. He claims he does not have seizures or delirium tremens. Get shaky and sick to his stomach usually when withdrawing. Doesn't comply with outpatient substance abuse treatment recommendations HPI Elements:   Quality:  Self-mutilation with suicidal ideation. Severity:  Severe potentially life threatening. Timing:  Acute last night. Duration:  Recurrent problem still depressed today. Context:  Ongoing drinking.  Past Medical History:  Past Medical History  Diagnosis Date  . Hypertension 01/02/2015   History reviewed. No pertinent past surgical history. Family History: History reviewed. No pertinent family history. Social History:  History  Alcohol Use  . Yes     History  Drug Use No    Social History   Social History  . Marital Status: Married    Spouse Name: N/A  . Number of Children: N/A  . Years of Education: N/A   Social History Main Topics  . Smoking status: Former Smoker -- 65  years  . Smokeless tobacco: None  . Alcohol Use: Yes  . Drug Use: No  . Sexual Activity: Yes    Birth Control/ Protection: Condom   Other Topics Concern  . None   Social History Narrative   Additional Social History:    History of alcohol / drug use?: Yes Longest period of sobriety (when/how long): "couple of days ago" Negative  Consequences of Use: Personal relationships Withdrawal Symptoms: Agitation                     Allergies:  No Known Allergies  Labs:  Results for orders placed or performed during the hospital encounter of 03/19/15 (from the past 48 hour(s))  Comprehensive metabolic panel     Status: Abnormal   Collection Time: 03/19/15  3:29 AM  Result Value Ref Range   Sodium 136 135 - 145 mmol/L   Potassium 3.3 (L) 3.5 - 5.1 mmol/L   Chloride 104 101 - 111 mmol/L   CO2 22 22 - 32 mmol/L   Glucose, Bld 97 65 - 99 mg/dL   BUN 8 6 - 20 mg/dL   Creatinine, Ser 0.93 0.61 - 1.24 mg/dL   Calcium 8.0 (L) 8.9 - 10.3 mg/dL   Total Protein 7.2 6.5 - 8.1 g/dL   Albumin 3.9 3.5 - 5.0 g/dL   AST 45 (H) 15 - 41 U/L   ALT 47 17 - 63 U/L   Alkaline Phosphatase 70 38 - 126 U/L   Total Bilirubin 0.5 0.3 - 1.2 mg/dL   GFR calc non Af Amer >60 >60 mL/min   GFR calc Af Amer >60 >60 mL/min    Comment: (NOTE) The eGFR has been calculated using the CKD EPI equation. This calculation has not been validated in all clinical situations. eGFR's persistently <60 mL/min signify possible Chronic Kidney Disease.    Anion gap 10 5 - 15  Ethanol (ETOH)     Status: Abnormal   Collection Time: 03/19/15  3:29 AM  Result Value Ref Range   Alcohol, Ethyl (B) 256 (H) <5 mg/dL    Comment:        LOWEST DETECTABLE LIMIT FOR SERUM ALCOHOL IS 5 mg/dL FOR MEDICAL PURPOSES ONLY   Salicylate level     Status: None   Collection Time: 03/19/15  3:29 AM  Result Value Ref Range   Salicylate Lvl 17.7 2.8 - 30.0 mg/dL  Acetaminophen level     Status: Abnormal   Collection Time: 03/19/15  3:29 AM  Result Value Ref Range   Acetaminophen (Tylenol), Serum <10 (L) 10 - 30 ug/mL    Comment:        THERAPEUTIC CONCENTRATIONS VARY SIGNIFICANTLY. A RANGE OF 10-30 ug/mL MAY BE AN EFFECTIVE CONCENTRATION FOR MANY PATIENTS. HOWEVER, SOME ARE BEST TREATED AT CONCENTRATIONS OUTSIDE THIS RANGE. ACETAMINOPHEN CONCENTRATIONS >150  ug/mL AT 4 HOURS AFTER INGESTION AND >50 ug/mL AT 12 HOURS AFTER INGESTION ARE OFTEN ASSOCIATED WITH TOXIC REACTIONS.   CBC     Status: Abnormal   Collection Time: 03/19/15  3:29 AM  Result Value Ref Range   WBC 9.9 3.8 - 10.6 K/uL   RBC 4.30 (L) 4.40 - 5.90 MIL/uL   Hemoglobin 14.3 13.0 - 18.0 g/dL   HCT 41.3 40.0 - 52.0 %   MCV 96.2 80.0 - 100.0 fL   MCH 33.2 26.0 - 34.0 pg   MCHC 34.5 32.0 - 36.0 g/dL   RDW 13.6 11.5 - 14.5 %   Platelets 178 150 -  440 K/uL  Urine Drug Screen, Qualitative ([facility] only)     Status: None   Collection Time: 03/19/15  3:29 AM  Result Value Ref Range   Tricyclic, Ur Screen NONE DETECTED NONE DETECTED   Amphetamines, Ur Screen NONE DETECTED NONE DETECTED   MDMA (Ecstasy)Ur Screen NONE DETECTED NONE DETECTED   Cocaine Metabolite,Ur [place] NONE DETECTED NONE DETECTED   Opiate, Ur Screen NONE DETECTED NONE DETECTED   Phencyclidine (PCP) Ur S NONE DETECTED NONE DETECTED   Cannabinoid 50 Ng, Ur [place] NONE DETECTED NONE DETECTED   Barbiturates, Ur Screen NONE DETECTED NONE DETECTED   Benzodiazepine, Ur Scrn NONE DETECTED NONE DETECTED   Methadone Scn, Ur NONE DETECTED NONE DETECTED    Comment: (NOTE) 130  Tricyclics, urine               Cutoff 1000 ng/mL 200  Amphetamines, urine             Cutoff 1000 ng/mL 300  MDMA (Ecstasy), urine           Cutoff 500 ng/mL 400  Cocaine Metabolite, urine       Cutoff 300 ng/mL 500  Opiate, urine                   Cutoff 300 ng/mL 600  Phencyclidine (PCP), urine      Cutoff 25 ng/mL 700  Cannabinoid, urine              Cutoff 50 ng/mL 800  Barbiturates, urine             Cutoff 200 ng/mL 900  Benzodiazepine, urine           Cutoff 200 ng/mL 1000 Methadone, urine                Cutoff 300 ng/mL 1100 1200 The urine drug screen provides only a preliminary, unconfirmed 1300 analytical test result and should not be used for non-medical 1400 purposes. Clinical consideration and professional judgment should 1500 be applied to  any positive drug screen result due to possible 1600 interfering substances. A more specific alternate chemical method 1700 must be used in order to obtain a confirmed analytical result.  1800 Gas chromato graphy / mass spectrometry (GC/MS) is the preferred 1900 confirmatory method.   Salicylate level     Status: None   Collection Time: 03/19/15  8:34 AM  Result Value Ref Range   Salicylate Lvl 86.5 2.8 - 30.0 mg/dL    Vitals: Blood pressure 134/90, pulse 100, temperature 98.1 F (36.7 C), temperature source Oral, resp. rate 20, SpO2 95 %.  Risk to Self: Suicidal Ideation: Yes-Currently Present Suicidal Intent: Yes-Currently Present Is patient at risk for suicide?: Yes Suicidal Plan?: Yes-Currently Present Specify Current Suicidal Plan: Pt cut neck Access to Means: Yes Specify Access to Suicidal Means: Pt has knives What has been your use of drugs/alcohol within the last 12 months?: alcohol How many times?: 5 Other Self Harm Risks: none Triggers for Past Attempts: Unpredictable, Other (Comment) Intentional Self Injurious Behavior: Cutting Comment - Self Injurious Behavior: history of cutting neck Risk to Others: Homicidal Ideation: No Thoughts of Harm to Others: No Current Homicidal Intent: No Current Homicidal Plan: No Access to Homicidal Means: No Identified Victim: n/a History of harm to others?: No Assessment of Violence: None Noted Violent Behavior Description: n/a Does patient have access to weapons?: No Criminal Charges Pending?: No Does patient have a court date: No Prior Inpatient Therapy: Prior Inpatient Therapy: Yes  Prior Therapy Dates: 06/2014 Prior Therapy Facilty/Provider(s): [facility] Reason for Treatment: SI Prior Outpatient Therapy: Prior Outpatient Therapy: No Prior Therapy Dates: N/A Prior Therapy Facilty/Provider(s): N/A Reason for Treatment: N/A Does patient have an ACCT team?: No Does patient have Intensive In-House Services?  : No Does patient have  [facility] services? : No Does patient have P4CC services?: No  Current Facility-Administered Medications  Medication Dose Route Frequency Provider Last Rate Last Dose  . citalopram (CELEXA) tablet 40 mg  40 mg Oral Daily Gonzella Lex, MD      . folic acid (FOLVITE) tablet 1 mg  1 mg Oral Daily Leeza Heiner T Judee Hennick, MD      . LORazepam (ATIVAN) tablet 1 mg  1 mg Oral Q6H PRN Gonzella Lex, MD       Or  . LORazepam (ATIVAN) injection 1 mg  1 mg Intravenous Q6H PRN Gonzella Lex, MD      . LORazepam (ATIVAN) tablet 0-4 mg  0-4 mg Oral Q6H Gonzella Lex, MD       Followed by  . [START ON 03/21/2015] LORazepam (ATIVAN) tablet 0-4 mg  0-4 mg Oral Q12H Gonzella Lex, MD      . multivitamin with minerals tablet 1 tablet  1 tablet Oral Daily Signa Cheek T Eduarda Scrivens, MD      . thiamine (VITAMIN B-1) tablet 100 mg  100 mg Oral Daily Gonzella Lex, MD       Or  . thiamine (B-1) injection 100 mg  100 mg Intravenous Daily Gonzella Lex, MD      . traZODone (DESYREL) tablet 50 mg  50 mg Oral QHS Gonzella Lex, MD       Current Outpatient Prescriptions  Medication Sig Dispense Refill  . citalopram (CELEXA) 40 MG tablet Take 40 mg by mouth every morning.    . disulfiram (ANTABUSE) 250 MG tablet Take 250 mg by mouth every morning.    . hydrOXYzine (VISTARIL) 25 MG capsule Take 25 mg by mouth 3 (three) times daily.    Marland Kitchen loratadine (CLARITIN) 10 MG tablet Take 10 mg by mouth every morning.    . traMADol (ULTRAM) 50 MG tablet Take 2 tablets by mouth 3 (three) times daily as needed for moderate pain.     . traZODone (DESYREL) 150 MG tablet Take 150 mg by mouth at bedtime.      Musculoskeletal: Strength & Muscle Tone: decreased Gait & Station: unsteady Patient leans: N/A  Psychiatric Specialty Exam: Physical Exam  Nursing note and vitals reviewed. Constitutional: He appears well-developed and well-nourished.  HENT:  Head: Normocephalic and atraumatic.  Eyes: Conjunctivae are normal. Pupils are equal, round,  and reactive to light.  Neck: Normal range of motion.  Cardiovascular: Normal heart sounds.   Respiratory: Effort normal.  GI: Soft.  Musculoskeletal: Normal range of motion.  Neurological: He is alert.  Skin: Skin is warm and dry.     Psychiatric: His affect is blunt. His speech is delayed. He is slowed. Cognition and memory are impaired. He expresses impulsivity. He expresses suicidal ideation.    Review of Systems  Constitutional: Negative.   HENT: Negative.   Eyes: Negative.   Respiratory: Negative.   Cardiovascular: Negative.   Gastrointestinal: Negative.   Musculoskeletal: Negative.   Skin: Negative.   Neurological: Negative.   Psychiatric/Behavioral: Positive for depression, suicidal ideas and substance abuse. Negative for hallucinations. The patient has insomnia. The patient is not nervous/anxious.     Blood pressure 134/90,  pulse 100, temperature 98.1 F (36.7 C), temperature source Oral, resp. rate 20, SpO2 95 %.There is no weight on file to calculate BMI.  General Appearance: Fairly Groomed  Engineer, water::  Absent  Speech:  Blocked  Volume:  Decreased  Mood:  Depressed  Affect:  Depressed  Thought Process:  Loose  Orientation:  Full (Time, Place, and Person)  Thought Content:  Negative  Suicidal Thoughts:  Yes.  with intent/plan  Homicidal Thoughts:  No  Memory:  Immediate;   Fair Recent;   Fair Remote;   Fair  Judgement:  Impaired  Insight:  Lacking  Psychomotor Activity:  Decreased  Concentration:  Poor  Recall:  Poor  Fund of Knowledge:Fair  Language: Poor  Akathisia:  No  Handed:  Right  AIMS (if indicated):     Assets:  Agricultural consultant Housing Physical Health Social Support  ADL's:  Intact  Cognition: Impaired,  Mild  Sleep:      Medical Decision Making: Review of Psycho-Social Stressors (1), Review or order clinical lab tests (1), Established Problem, Worsening (2), Review of Medication Regimen & Side  Effects (2) and Review of New Medication or Change in Dosage (2)  Treatment Plan Summary: Daily contact with patient to assess and evaluate symptoms and progress in treatment, Medication management and Plan This is the second time fairly recently that he has presented with self-mutilation. The last time it was on the lower part of his body but now he is back to cutting himself on the neck which is obviously a serious and potentially life threatening behavior. His depression is clearly not getting any better. Although alcohol abuse is a major part of his problem he is also very depressed even when sober. He will be admitted to the hospital. I'm putting him on detox protocol. 15 minute checks in place. Continue citalopram. Primary team can reassess appropriate treatment of his depression and outpatient follow-up. Supportive counseling done. Reviewed with emergency room physician.  Plan:  Recommend psychiatric Inpatient admission when medically cleared. Disposition: Admit to psychiatry  Alethia Berthold 03/19/2015 12:39 PM
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
## 2015-03-19 NOTE — Tx Team (Signed)
Initial Interdisciplinary Treatment Plan   PATIENT STRESSORS: Medication change or noncompliance Substance abuse   PATIENT STRENGTHS: Financial means Physical Health Supportive family/friends   PROBLEM LIST: Problem List/Patient Goals Date to be addressed Date deferred Reason deferred Estimated date of resolution  Substance abuse 9/15.2016     Suicidal attempt 03/19/2015                                                DISCHARGE CRITERIA:  Improved stabilization in mood, thinking, and/or behavior Motivation to continue treatment in a less acute level of care Verbal commitment to aftercare and medication compliance Withdrawal symptoms are absent or subacute and managed without 24-hour nursing intervention  PRELIMINARY DISCHARGE PLAN: Attend PHP/IOP Outpatient therapy Return to previous living arrangement  PATIENT/FAMIILY INVOLVEMENT: This treatment plan has been presented to and reviewed with the patient, Jesse Obrien, and/or family member,   The patient and family have been given the opportunity to ask questions and make suggestions.  Jesse Obrien Jesse Obrien 03/19/2015, 6:35 PM

## 2015-03-19 NOTE — ED Notes (Signed)
md Clapacs has consulted and he will be admitted to [facility] when staff/bed become available   I have made the pt aware of the plan  Appropriate to stimulation  No verbalized needs or concerns at this time  NAD assessed  Continue to monitor

## 2015-03-19 NOTE — ED Notes (Signed)
He can be observed in hallway bed with his head lying on the pillow  Eyes closed  Appears to be sleeping  NAD
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
## 2015-03-19 NOTE — ED Notes (Signed)
Breakfast provided  Appropriate to stimulation  No verbalized needs or concerns at this time  NAD assessed  Continue to monitor 

## 2015-03-19 NOTE — BHH Counselor (Signed)
Pt. is to be admitted to [facility] by Dr. Weber Cooks. Attending Physician will be Dr. Jerilee Hoh.  Pt. has been assigned to room 324B, by [place].  Intake Paper Work has been signed and placed on pt. chart. ER staff ( ER Sect., ER MD; Amy, RN Patient's Nurse &  Patient Access) have been made aware of the admission.

## 2015-03-19 NOTE — ED Notes (Signed)
BEHAVIORAL HEALTH ROUNDING Patient sleeping: Yes.   Patient alert and oriented: sleeping Behavior appropriate: Yes.  ; If no, describe:  Nutrition and fluids offered: Yes  Toileting and hygiene offered: Yes  Sitter present: yes Law enforcement present: Yes

## 2015-03-19 NOTE — ED Notes (Signed)

## 2015-03-19 NOTE — ED Notes (Signed)
BEHAVIORAL HEALTH ROUNDING Patient sleeping: No. Patient alert and oriented: yes Behavior appropriate: Yes.  ; If no, describe:  Nutrition and fluids offered: yes Toileting and hygiene offered: Yes  Sitter present: q15 minute observations and security camera monitoring Law enforcement present: Yes  ODS  

## 2015-03-19 NOTE — Progress Notes (Signed)
Pt admitted without incident, skin assessment done and no contraband found. Pt has laceration to right neck and abrasion to left lower abdomen. Skin otherwise intact, warm, and dry. Pt states he is "just tired" and wants to get to bed. Pt is cooperative and answers questions minimally with a short pause before answering. Affect is flat, appears disheveled with dried blood on underwear from his neck laceration. Pt went to bed after admission was done.

## 2015-03-19 NOTE — ED Notes (Signed)
BEHAVIORAL HEALTH ROUNDING Patient sleeping: No. Patient alert and oriented: yes Behavior appropriate: Yes.   Nutrition and fluids offered: Yes  Toileting and hygiene offered: Yes  Sitter present: q15 min observations and security camera monitoring Law enforcement present: Yes [employer]  ENVIRONMENTAL ASSESSMENT Potentially harmful objects out of patient reach: Yes.   Personal belongings secured: Yes.   Patient dressed in hospital provided attire only: Yes.   Plastic bags out of patient reach: Yes.   Patient care equipment (cords, cables, call bells, lines, and drains) shortened, removed, or accounted for: Yes.   Equipment and supplies removed from bottom of stretcher: Yes.   Potentially toxic materials out of patient reach: Yes.   Sharps container removed or out of patient reach: Yes.

## 2015-03-19 NOTE — ED Notes (Signed)

## 2015-03-19 NOTE — ED Notes (Signed)
BEHAVIORAL HEALTH ROUNDING Patient sleeping: yes Patient alert and oriented: sleeping Behavior appropriate: Yes.  ; If no, describe:  Nutrition and fluids offered: Yes  Toileting and hygiene offered: Yes  Sitter present: yes Law enforcement present: Yes

## 2015-03-19 NOTE — ED Notes (Signed)
Pt on stretcher in 20 H area. No complaints or concerns voiced at this time. No abnormal behavior noted at this time. Will continue to monitor with q15 min checks. ODS officer in area.

## 2015-03-19 NOTE — ED Notes (Signed)
Pt in room. No complaints or concerns voiced at this time. No abnormal behavior noted at this time. Will continue to monitor with q15 min checks. ODS officer in area. 

## 2015-03-19 NOTE — ED Notes (Signed)
BEHAVIORAL HEALTH ROUNDING Patient sleeping: Yes.   Patient alert and oriented: not applicable Behavior appropriate: Yes.    Nutrition and fluids offered: No Toileting and hygiene offered: No Sitter present: q15 minute observations Law enforcement present: Yes [employer] 
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
## 2015-03-19 NOTE — ED Notes (Signed)
Patient assigned to appropriate care area. Patient oriented to unit/care area: Informed that, for their safety, care areas are designed for safety and monitored by security cameras at all times; and visiting hours explained to patient. Patient verbalizes understanding, and verbal contract for safety obtained.  Pt in 20 H bed. No complaints or concerns voiced at this time. No abnormal behavior noted at this time. Will continue to monitor with q15 min checks and security camera monitoring. ODS officer in area.

## 2015-03-19 NOTE — ED Notes (Signed)
Report called to [place]

## 2015-03-20 ENCOUNTER — Encounter: Payer: Self-pay | Admitting: Psychiatry

## 2015-03-20 DIAGNOSIS — IMO0002 Reserved for concepts with insufficient information to code with codable children: Secondary | ICD-10-CM

## 2015-03-20 DIAGNOSIS — F10939 Alcohol use, unspecified with withdrawal, unspecified: Secondary | ICD-10-CM | POA: Diagnosis present

## 2015-03-20 DIAGNOSIS — F332 Major depressive disorder, recurrent severe without psychotic features: Principal | ICD-10-CM

## 2015-03-20 DIAGNOSIS — F1020 Alcohol dependence, uncomplicated: Secondary | ICD-10-CM | POA: Diagnosis present

## 2015-03-20 DIAGNOSIS — F10239 Alcohol dependence with withdrawal, unspecified: Secondary | ICD-10-CM | POA: Diagnosis present

## 2015-03-20 DIAGNOSIS — F10230 Alcohol dependence with withdrawal, uncomplicated: Secondary | ICD-10-CM | POA: Diagnosis present

## 2015-03-20 DIAGNOSIS — M48061 Spinal stenosis, lumbar region without neurogenic claudication: Secondary | ICD-10-CM

## 2015-03-20 DIAGNOSIS — M1710 Unilateral primary osteoarthritis, unspecified knee: Secondary | ICD-10-CM

## 2015-03-20 DIAGNOSIS — M5136 Other intervertebral disc degeneration, lumbar region: Secondary | ICD-10-CM

## 2015-03-20 MED ORDER — VITAMIN B-1 100 MG PO TABS
100.0000 mg | ORAL_TABLET | Freq: Every day | ORAL | Status: DC
  Administered 2015-03-20 – 2015-03-23 (×4): 100 mg via ORAL
  Filled 2015-03-20 (×3): qty 1

## 2015-03-20 MED ORDER — LORAZEPAM 2 MG PO TABS: 2.0000 mg | ORAL_TABLET | Freq: Three times a day (TID) | ORAL | Status: DC | PRN

## 2015-03-20 MED ORDER — TRAZODONE HCL 50 MG PO TABS
150.0000 mg | ORAL_TABLET | Freq: Every day | ORAL | Status: DC
  Administered 2015-03-20 – 2015-03-23 (×4): 150 mg via ORAL
  Filled 2015-03-20 (×5): qty 1

## 2015-03-20 MED ORDER — PANTOPRAZOLE SODIUM 40 MG PO TBEC
40.0000 mg | DELAYED_RELEASE_TABLET | Freq: Every day | ORAL | Status: DC
  Administered 2015-03-20 – 2015-03-24 (×5): 40 mg via ORAL
  Filled 2015-03-20 (×5): qty 1

## 2015-03-20 MED ORDER — LIDOCAINE 5 % EX PTCH
1.0000 | MEDICATED_PATCH | CUTANEOUS | Status: DC
  Administered 2015-03-20 – 2015-03-24 (×5): 1 via TRANSDERMAL
  Filled 2015-03-20 (×6): qty 1

## 2015-03-20 MED ORDER — GABAPENTIN 300 MG PO CAPS
300.0000 mg | ORAL_CAPSULE | Freq: Three times a day (TID) | ORAL | Status: DC
  Administered 2015-03-20 – 2015-03-23 (×9): 300 mg via ORAL
  Filled 2015-03-20 (×9): qty 1

## 2015-03-20 MED ORDER — CHLORDIAZEPOXIDE HCL 25 MG PO CAPS
50.0000 mg | ORAL_CAPSULE | Freq: Three times a day (TID) | ORAL | Status: DC
  Administered 2015-03-20 – 2015-03-21 (×4): 50 mg via ORAL
  Filled 2015-03-20 (×4): qty 2

## 2015-03-20 MED ORDER — CITALOPRAM HYDROBROMIDE 20 MG PO TABS
20.0000 mg | ORAL_TABLET | Freq: Every day | ORAL | Status: DC
  Administered 2015-03-21 – 2015-03-24 (×4): 20 mg via ORAL
  Filled 2015-03-20 (×4): qty 1

## 2015-03-20 MED ORDER — MELOXICAM 7.5 MG PO TABS
15.0000 mg | ORAL_TABLET | Freq: Every day | ORAL | Status: DC
  Administered 2015-03-20 – 2015-03-24 (×5): 15 mg via ORAL
  Filled 2015-03-20 (×5): qty 2

## 2015-03-20 NOTE — Progress Notes (Signed)
Recreation Therapy Notes  At approximately 11:30 am, LRT attempted assessment. Patient refused stating he wanted to be left alone.  Leonette [facility], LRT/CTRS 03/20/2015 4:58 PM

## 2015-03-20 NOTE — BHH Suicide Risk Assessment (Signed)
[facility] Admission Suicide Risk Assessment   Nursing information obtained from:    Demographic factors:    Current Mental Status:    Loss Factors:    Historical Factors:    Risk Reduction Factors:    Total Time spent with patient: 1 hour Principal Problem: Severe recurrent major depression without psychotic features Diagnosis:   Patient Active Problem List   Diagnosis Date Noted  . Alcohol use disorder, severe, dependence [F10.20] 03/20/2015  . Alcohol withdrawal [F10.239] 03/20/2015  . DDD (degenerative disc disease), lumbar [M51.36] 03/20/2015  . Osteoarthrosis, unspecified whether generalized or localized, involving lower leg [M17.9] 03/20/2015  . Spinal stenosis of lumbar region [M48.06] 03/20/2015  . Severe recurrent major depression without psychotic features [F33.2] 03/19/2015  . Self inflicted neck laceration [T14.8] 01/02/2015  . Hypertension [I10] 01/02/2015     Continued Clinical Symptoms:  Alcohol Use Disorder Identification Test Final Score (AUDIT): 16 The "Alcohol Use Disorders Identification Test", Guidelines for Use in Primary Care, Second Edition.  World [facility]). Score between 0-7:  no or low risk or alcohol related problems. Score between 8-15:  moderate risk of alcohol related problems. Score between 16-19:  high risk of alcohol related problems. Score 20 or above:  warrants further diagnostic evaluation for alcohol dependence and treatment.   CLINICAL FACTORS:   Depression:   Comorbid alcohol abuse/dependence Impulsivity Severe Alcohol/Substance Abuse/Dependencies Chronic Pain Previous Psychiatric Diagnoses and Treatments Medical Diagnoses and Treatments/Surgeries   Psychiatric Specialty Exam: Physical Exam  ROS    COGNITIVE FEATURES THAT CONTRIBUTE TO RISK:  None    SUICIDE RISK:   Severe:  Frequent, intense, and enduring suicidal ideation, specific plan, no subjective intent, but some objective markers of intent (i.e., choice of  lethal method), the method is accessible, some limited preparatory behavior, evidence of impaired self-control, severe dysphoria/symptomatology, multiple risk factors present, and few if any protective factors, particularly a lack of social support.  PLAN OF CARE: admit to [place] Making:  Established Problem, Worsening (2)  I certify that inpatient services furnished can reasonably be expected to improve the patient's condition.   Hildred Priest 03/20/2015, 2:08 PM

## 2015-03-20 NOTE — BHH Group Notes (Signed)
[place] Group Notes:  (Nursing/MHT/Case Management/Adjunct)  Date:  03/20/2015  Time:  11:57 AM  Type of Therapy:  Psychoeducational Skills  Participation Level:  Did Not Attend    Drake Leach 03/20/2015, 11:57 AM

## 2015-03-20 NOTE — BHH Group Notes (Signed)
[facility] LCSW Aftercare Discharge Planning Group Note   03/20/2015 1:09 PM  Participation Quality: Did not attend.   Wray Kearns, MSW, [place]

## 2015-03-20 NOTE — Progress Notes (Signed)
D: Pt denies SI/HI/AVH. Pt is irritable, tense and guarded with information, and not willing to participate in plan of care. Patient is not interacting with peers and staff.   A:  Pt was given scheduled medications. Pt was encouraged to attend groups. Q 15 minute checks were done for safety.  R:Pt attends evening group but did not interact with peers and staff. Pt is taking medication. Pt has no complaints. safety maintained on unit.

## 2015-03-20 NOTE — Plan of Care (Signed)
Problem: Ineffective individual coping Goal: LTG: Patient will report a decrease in negative feelings Outcome: Not Progressing Reluctant to interact with staff or peers.  Looks angry.  Stays to himself.

## 2015-03-20 NOTE — Progress Notes (Signed)
D: Pt denies SI/HI/AVH. Pt is irritable angry and agitated. Patient isolates to the room and he's not interacting with peers and staff.  Patient is currently on CIWA q 6 hours he appears anxious and was medicated per protocol.   A: Pt was offered support and encouragement. Pt was given scheduled medications. Pt was encouraged to attend groups. Q 15 minute checks were done for safety.  R:Pt did not attends evening group. Pt is compliant with medication. Pt has no complaints.Pt receptive to treatment and safety maintained on unit.

## 2015-03-20 NOTE — Progress Notes (Signed)
Patient stays to himself.  Reluctant to talk.  Looks tense.  Safety maintained.

## 2015-03-20 NOTE — Progress Notes (Signed)
Recreation Therapy Notes  Date: 09.16.16 Time: 3:00 pm Location: [facility]  Group Topic: Coping Skills  Goal Area(s) Addresses:  Patient will participate in healthy coping skill. Patient will verbalize at least one emotion experienced during group.  Behavioral Response: Did not attend  Intervention: Coloring  Activity: Patients were instructed to color either mandalas or other coloring sheets and focus on the emotions they were experiencing.  Education: LRT educated patients on different coping skills.  Education Outcome: Patient did not attend group.   Clinical Observations/Feedback: Patient did not attend group.  Leonette Monarch, LRT/CTRS 03/20/2015 4:46 PM

## 2015-03-20 NOTE — Plan of Care (Signed)
Problem: Alteration in mood & ability to function due to Goal: LTG-Pt reports reduction in suicidal thoughts (Patient reports reduction in suicidal thoughts and is able to verbalize a safety plan for whenever patient is feeling suicidal)  Outcome: Progressing Patient denies SI/HI/AVH. Goal: LTG-Pt verbalizes understanding of importance of med regimen (Patient verbalizes understanding of importance of medication regimen and need to continue outpatient care and support groups)  Outcome: Progressing Patient is compliant with medication.

## 2015-03-20 NOTE — Plan of Care (Signed)
Problem: Alteration in mood & ability to function due to Goal: LTG-Pt reports reduction in suicidal thoughts (Patient reports reduction in suicidal thoughts and is able to verbalize a safety plan for whenever patient is feeling suicidal)  Outcome: Progressing Patient denies SI/HI.  Goal: STG: Patient verbalizes decreases in signs of withdrawal Outcome: Progressing Patient verbalized decreased withdrawal symptoms.
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
## 2015-03-21 MED ORDER — CHLORDIAZEPOXIDE HCL 25 MG PO CAPS
25.0000 mg | ORAL_CAPSULE | Freq: Three times a day (TID) | ORAL | Status: AC
  Administered 2015-03-21 – 2015-03-24 (×9): 25 mg via ORAL
  Filled 2015-03-21 (×9): qty 1

## 2015-03-21 NOTE — [facility] Group Notes (Signed)
[place] LCSW Group Therapy  03/21/2015 3:30 PM  Type of Therapy:  Group Therapy  Participation Level:  Did Not Attend  Modes of Intervention:  Discussion, Education, Socialization and Support  Summary of Progress/Problems:Balance in life: Patients will discuss the concept of balance and how it looks and feels to be unbalanced. Pt will identify areas in their life that is unbalanced and ways to become more balanced.    [place] MSW, [place]  03/21/2015, 3:30 PM

## 2015-03-21 NOTE — Progress Notes (Signed)
[facility] MD Progress Note  03/21/2015 4:25 PM Jesse Obrien  MRN:  [id number] Subjective:   Patient is a 54 year old male who arrived to the ED via EMS on 9/15 from home with self-inflicted laceration to the right side of his neck along with alcohol intoxication. Patient has frequent history of similar presentations to the ED. Patient admits to drinking a case of beer in the last day as well as unknown dose of aspirin. Admits to cutting his neck with a shaving razor.   Laceration required closure with Dermabond and Steri-Strips. During my interview with patient today he reported that he is trying to relax. He was minimizing his alcohol use. He reported that he was just trying to calm down and wants to have a double portion. He reported that she does not have any heart problems. He was minimizing his use of alcohol. He currently is not experiencing any withdrawal symptoms he is been compliant with his medication and has been monitored closely by the staff. He is is spending most of the time in his room.  Principal Problem: Severe recurrent major depression without psychotic features Diagnosis:   Patient Active Problem List   Diagnosis Date Noted  . Alcohol use disorder, severe, dependence [F10.20] 03/20/2015  . Alcohol withdrawal [F10.239] 03/20/2015  . DDD (degenerative disc disease), lumbar [M51.36] 03/20/2015  . Osteoarthrosis, unspecified whether generalized or localized, involving lower leg [M17.9] 03/20/2015  . Spinal stenosis of lumbar region [M48.06] 03/20/2015  . Severe recurrent major depression without psychotic features [F33.2] 03/19/2015  . Self inflicted neck laceration [T14.8] 01/02/2015  . Hypertension [I10] 01/02/2015   Total Time spent with patient: 30 minutes   Past Medical History:  Past Medical History  Diagnosis Date  . Hypertension 01/02/2015   History reviewed. No pertinent past surgical history. Family History: History reviewed. No pertinent family history. Social  History:  History  Alcohol Use  . Yes     History  Drug Use No    Social History   Social History  . Marital Status: Married    Spouse Name: N/A  . Number of Children: N/A  . Years of Education: N/A   Social History Main Topics  . Smoking status: Former Smoker -- 15 years  . Smokeless tobacco: None  . Alcohol Use: Yes  . Drug Use: No  . Sexual Activity: Yes    Birth Control/ Protection: Condom   Other Topics Concern  . None   Social History Narrative   Additional History:    Sleep: Good  Appetite:  Good   Assessment:   Musculoskeletal: Strength & Muscle Tone: within normal limits Gait & Station: normal Patient leans: N/A   Psychiatric Specialty Exam: Physical Exam  ROS  Blood pressure 120/82, pulse 80, temperature 98.3 F (36.8 C), temperature source Oral, resp. rate 18, height 5\' 10"  (1.778 m), weight 205 lb (92.987 kg), SpO2 98 %.Body mass index is 29.41 kg/(m^2).  General Appearance: Casual and Fairly Groomed  Engineer, water::  Fair  Speech:  Clear and Coherent and Slow  Volume:  Decreased  Mood:  Anxious and Depressed  Affect:  Congruent  Thought Process:  Linear and Logical  Orientation:  Full (Time, Place, and Person)  Thought Content:  WDL  Suicidal Thoughts:  No  Homicidal Thoughts:  No  Memory:  Immediate;   Fair  Judgement:  Fair  Insight:  Lacking  Psychomotor Activity:  Normal  Concentration:  Fair  Recall:  [employer] of [place]  Language: Fair  Akathisia:  No  Handed:  Right  AIMS (if indicated):     Assets:  Communication Skills Physical Health Social Support  ADL's:  Intact  Cognition: WNL  Sleep:  Number of Hours: 8.15     Current Medications: Current Facility-Administered Medications  Medication Dose Route Frequency Provider Last Rate Last Dose  . alum & mag hydroxide-simeth (MAALOX/MYLANTA) 200-200-20 MG/5ML suspension 30 mL  30 mL Oral Q4H PRN Gonzella Lex, MD      . chlordiazePOXIDE (LIBRIUM) capsule 25 mg   25 mg Oral TID Rainey Pines, MD      . citalopram (CELEXA) tablet 20 mg  20 mg Oral Daily Hildred Priest, MD   20 mg at 03/21/15 0945  . folic acid (FOLVITE) tablet 1 mg  1 mg Oral Daily Gonzella Lex, MD   1 mg at 03/21/15 0945  . gabapentin (NEURONTIN) capsule 300 mg  300 mg Oral TID Hildred Priest, MD   300 mg at 03/21/15 0944  . lidocaine (LIDODERM) 5 % 1 patch  1 patch Transdermal Q24H Hildred Priest, MD   1 patch at 03/21/15 1430  . LORazepam (ATIVAN) tablet 2 mg  2 mg Oral TID PRN Hildred Priest, MD      . magnesium hydroxide (MILK OF MAGNESIA) suspension 30 mL  30 mL Oral Daily PRN Gonzella Lex, MD      . meloxicam (MOBIC) tablet 15 mg  15 mg Oral Daily Hildred Priest, MD   15 mg at 03/21/15 0944  . multivitamin with minerals tablet 1 tablet  1 tablet Oral Daily Gonzella Lex, MD   1 tablet at 03/21/15 0944  . pantoprazole (PROTONIX) EC tablet 40 mg  40 mg Oral Daily Hildred Priest, MD   40 mg at 03/21/15 0946  . thiamine (VITAMIN B-1) tablet 100 mg  100 mg Oral Daily Hildred Priest, MD   100 mg at 03/21/15 0944  . traZODone (DESYREL) tablet 150 mg  150 mg Oral QHS Hildred Priest, MD   150 mg at 03/20/15 2156    Lab Results:  Results for orders placed or performed during the hospital encounter of 03/19/15 (from the past 48 hour(s))  TSH     Status: None   Collection Time: 03/19/15  5:12 PM  Result Value Ref Range   TSH 0.607 0.350 - 4.500 uIU/mL    Physical Findings: AIMS: Facial and Oral Movements Muscles of Facial Expression: None, normal Lips and Perioral Area: None, normal Jaw: None, normal Tongue: None, normal,Extremity Movements Upper (arms, wrists, hands, fingers): None, normal Lower (legs, knees, ankles, toes): None, normal, Trunk Movements Neck, shoulders, hips: None, normal, Overall Severity Severity of abnormal movements (highest score from questions above): None,  normal Incapacitation due to abnormal movements: None, normal Patient's awareness of abnormal movements (rate only patient's report): No Awareness, Dental Status Current problems with teeth and/or dentures?: Yes Does patient usually wear dentures?: No  CIWA:  CIWA-Ar Total: 6 COWS:     Treatment Plan Summary: Daily contact with patient to assess and evaluate symptoms and progress in treatment and Medication management   Medical Decision Making:  New problem, with additional work up planned, Review of Psycho-Social Stressors (1) and Established Problem, Worsening (2)  Daily contact with patient to assess and evaluate symptoms and progress in treatment and Medication management   54 year old Caucasian male who has been in our emergency Department 9 times since January. So far this year the patient has had 2 suicidal attempts by self-inflicted injuries  to his neck. Patient has past history of alcohol dependence, depression and possible chronic pain  History of major depressive disorder: Continue citalopram 40 mg a day  Insomnia: Continue trazodone 50 mg by mouth daily at bedtime as needed  Alcohol withdrawal : Patient has been is started on a Librium taper, vital signs will be check every 8 hours, CIWA will be check every 8 hours. Ativan 2 mg will be offered when necessary if CIWA is greater than 15. Patient also receiving vitamin means and timing  Tobacco use disorder : Patient declines from receiving nicotine replacement   Vital signs every 8 hours  Diet low sodium  Precautions and every 15 minute checks and withdrawal precautions  Discharge planning: I we'll discuss case with social worker potentially if beds available we can transfer the patient under involuntary commitment to [place].      Rainey Pines 03/21/2015, 4:25 PM

## 2015-03-21 NOTE — Progress Notes (Signed)
Pt has been seclusive to his  room except at meal time. Pt's mood and affect has been depressed. Pt denies SI and A/v hallucinations.Pt c/o being tired . Pt is able to contract for safety. No withdawal s/s noted. Pt noted to be slightly irritable at times.
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
## 2015-03-21 NOTE — BHH Group Notes (Signed)
[facility] Group Notes:  (Nursing/MHT/Case Management/Adjunct)  Date:  03/21/2015  Time:  10:02 PM  Type of Therapy:  Evening Wrap-up Group  Participation Level:  Active  Participation Quality:  Appropriate  Affect:  Appropriate  Cognitive:  Alert  Insight:  Appropriate  Engagement in Group:  Engaged  Modes of Intervention:  Activity  Summary of Progress/Problems:  Levonne Spiller 03/21/2015, 10:02 PM

## 2015-03-22 NOTE — Progress Notes (Signed)
Pt has about 3-4 in open wound on rt. Side of his neck. Sterile stripes were appied in the ED but they came off when he showered . Medical consult ordered.

## 2015-03-22 NOTE — Progress Notes (Signed)
This 54 y.o. Male today stated, "I'm doing better but, I feel like I have been drinking a couple of beers; my wound is not healing(noted that right side of the neck wound with bloody drainage); bandage placed. Client continue to speak of, "that ER doctor didn't put those damn staples in right; I need to have another doctor look at this; I need to get this done and go home."

## 2015-03-22 NOTE — BHH Group Notes (Signed)
[facility] LCSW Group Therapy  03/22/2015 2:48 PM  Type of Therapy:  Group Therapy  Participation Level:  Minimal  Participation Quality:  Attentive  Affect:  Appropriate  Cognitive:  Alert  Insight:  Limited  Engagement in Therapy:  Limited  Modes of Intervention:  Discussion, Education, Socialization and Support  Summary of Progress/Problems: Pt will learn how to identify obstacles, self-sabotaging and enabling behaviors, what they are and why we do them. Pt will discuss unhealthy relationships and how to have positive healthy boundaries with those that sabotage and enable. Pt will identify aspects of self sabotage and enabling in themselves and how to limit these behaviors.  Jesse Obrien attended group and stayed the entire time. He discussed setting boundaries with his wife.   [place] MSW, [place]  03/22/2015, 2:48 PM

## 2015-03-22 NOTE — Progress Notes (Signed)
[facility] MD Progress Note  03/22/2015 6:32 PM ISSIAH HUFFAKER  MRN:  [id number] Subjective:   Patient is a 54 year old male who arrived to the ED via EMS on 9/15 from home with self-inflicted laceration to the right side of his neck along with alcohol intoxication. Patient seen for follow-up. He reported that he has started improving and the Steri-Strips his neck came out while he was taking shower yesterday. He reported that the medications have been helping him and he is not having any withdrawal symptoms at this time. He currently denied having any anxiety and his mood symptoms are also improving. He is not attending any groups at this time. He is being monitored closely by the staff. He is is spending most of the time in his room.   Principal Problem: Severe recurrent major depression without psychotic features Diagnosis:   Patient Active Problem List   Diagnosis Date Noted  . Alcohol use disorder, severe, dependence [F10.20] 03/20/2015  . Alcohol withdrawal [F10.239] 03/20/2015  . DDD (degenerative disc disease), lumbar [M51.36] 03/20/2015  . Osteoarthrosis, unspecified whether generalized or localized, involving lower leg [M17.9] 03/20/2015  . Spinal stenosis of lumbar region [M48.06] 03/20/2015  . Severe recurrent major depression without psychotic features [F33.2] 03/19/2015  . Self inflicted neck laceration [T14.8] 01/02/2015  . Hypertension [I10] 01/02/2015   Total Time spent with patient: 30 minutes   Past Medical History:  Past Medical History  Diagnosis Date  . Hypertension 01/02/2015   History reviewed. No pertinent past surgical history. Family History: History reviewed. No pertinent family history. Social History:  History  Alcohol Use  . Yes     History  Drug Use No    Social History   Social History  . Marital Status: Married    Spouse Name: N/A  . Number of Children: N/A  . Years of Education: N/A   Social History Main Topics  . Smoking status: Former Smoker  -- 15 years  . Smokeless tobacco: None  . Alcohol Use: Yes  . Drug Use: No  . Sexual Activity: Yes    Birth Control/ Protection: Condom   Other Topics Concern  . None   Social History Narrative   Additional History:    Sleep: Good  Appetite:  Good   Assessment:   Musculoskeletal: Strength & Muscle Tone: within normal limits Gait & Station: normal Patient leans: N/A   Psychiatric Specialty Exam: Physical Exam   ROS   Blood pressure 107/73, pulse 73, temperature 98.2 F (36.8 C), temperature source Oral, resp. rate 18, height 5\' 10"  (1.778 m), weight 205 lb (92.987 kg), SpO2 98 %.Body mass index is 29.41 kg/(m^2).  General Appearance: Casual and Fairly Groomed  Engineer, water::  Fair  Speech:  Clear and Coherent and Slow  Volume:  Decreased  Mood:  Anxious and Depressed  Affect:  Congruent  Thought Process:  Linear and Logical  Orientation:  Full (Time, Place, and Person)  Thought Content:  WDL  Suicidal Thoughts:  No  Homicidal Thoughts:  No  Memory:  Immediate;   Fair  Judgement:  Fair  Insight:  Lacking  Psychomotor Activity:  Normal  Concentration:  Fair  Recall:  [employer] of Knowledge:Fair  Language: Fair  Akathisia:  No  Handed:  Right  AIMS (if indicated):     Assets:  Armed forces logistics/support/administrative officer Physical Health Social Support  ADL's:  Intact  Cognition: WNL  Sleep:  Number of Hours: 6     Current Medications: Current Facility-Administered Medications  Medication Dose Route Frequency Provider Last Rate Last Dose  . alum & mag hydroxide-simeth (MAALOX/MYLANTA) 200-200-20 MG/5ML suspension 30 mL  30 mL Oral Q4H PRN Gonzella Lex, MD      . chlordiazePOXIDE (LIBRIUM) capsule 25 mg  25 mg Oral TID Rainey Pines, MD   25 mg at 03/22/15 1637  . citalopram (CELEXA) tablet 20 mg  20 mg Oral Daily Hildred Priest, MD   20 mg at 03/22/15 0953  . folic acid (FOLVITE) tablet 1 mg  1 mg Oral Daily Gonzella Lex, MD   1 mg at 03/22/15 0954  .  gabapentin (NEURONTIN) capsule 300 mg  300 mg Oral TID Hildred Priest, MD   300 mg at 03/22/15 1637  . lidocaine (LIDODERM) 5 % 1 patch  1 patch Transdermal Q24H Hildred Priest, MD   1 patch at 03/22/15 1321  . LORazepam (ATIVAN) tablet 2 mg  2 mg Oral TID PRN Hildred Priest, MD      . magnesium hydroxide (MILK OF MAGNESIA) suspension 30 mL  30 mL Oral Daily PRN Gonzella Lex, MD      . meloxicam (MOBIC) tablet 15 mg  15 mg Oral Daily Hildred Priest, MD   15 mg at 03/22/15 0953  . multivitamin with minerals tablet 1 tablet  1 tablet Oral Daily Gonzella Lex, MD   1 tablet at 03/22/15 0957  . pantoprazole (PROTONIX) EC tablet 40 mg  40 mg Oral Daily Hildred Priest, MD   40 mg at 03/22/15 0953  . thiamine (VITAMIN B-1) tablet 100 mg  100 mg Oral Daily Hildred Priest, MD   100 mg at 03/22/15 0953  . traZODone (DESYREL) tablet 150 mg  150 mg Oral QHS Hildred Priest, MD   150 mg at 03/21/15 2151    Lab Results:  No results found for this or any previous visit (from the past 50 hour(s)).  Physical Findings: AIMS: Facial and Oral Movements Muscles of Facial Expression: None, normal Lips and Perioral Area: None, normal Jaw: None, normal Tongue: None, normal,Extremity Movements Upper (arms, wrists, hands, fingers): None, normal Lower (legs, knees, ankles, toes): None, normal, Trunk Movements Neck, shoulders, hips: None, normal, Overall Severity Severity of abnormal movements (highest score from questions above): None, normal Incapacitation due to abnormal movements: None, normal Patient's awareness of abnormal movements (rate only patient's report): No Awareness, Dental Status Current problems with teeth and/or dentures?: Yes Does patient usually wear dentures?: No  CIWA:  CIWA-Ar Total: 2 COWS:     Treatment Plan Summary: Daily contact with patient to assess and evaluate symptoms and progress in treatment and  Medication management   Medical Decision Making:  New problem, with additional work up planned, Review of Psycho-Social Stressors (1) and Established Problem, Worsening (2)  Daily contact with patient to assess and evaluate symptoms and progress in treatment and Medication management   54 year old Caucasian male who has been in our emergency Department 9 times since January. So far this year the patient has had 2 suicidal attempts by self-inflicted injuries to his neck. Patient has past history of alcohol dependence, depression and possible chronic pain  History of major depressive disorder: Continue citalopram 40 mg a day  Insomnia: Continue trazodone 50 mg by mouth daily at bedtime as needed  Alcohol withdrawal : Patient has been is started on a Librium taper, vital signs will be check every 8 hours, CIWA will be check every 8 hours. Ativan 2 mg will be offered when necessary if CIWA  is greater than 15. Patient also receiving vitamin means and timing  Tobacco use disorder : Patient declines from receiving nicotine replacement   Vital signs every 8 hours  Diet low sodium  Precautions and every 15 minute checks and withdrawal precautions  Discharge planning: I will discuss case with social worker potentially if beds available we can transfer the patient under involuntary commitment to [place].      Rainey Pines 03/22/2015, 6:32 PM

## 2015-03-22 NOTE — Progress Notes (Signed)
Pt has been less seclusive to his room this period. Pt's mood and affect has been depressed. Pt denies SI and A/v hallucinations.Pt c/o being tired . Pt is able to contract for safety. No withdawal s/s noted. Pt noted to be slightly irritable at times.

## 2015-03-23 MED ORDER — GABAPENTIN 400 MG PO CAPS: 400.0000 mg | ORAL_CAPSULE | Freq: Three times a day (TID) | ORAL | Status: DC

## 2015-03-23 MED ORDER — PANTOPRAZOLE SODIUM 40 MG PO TBEC: 40.0000 mg | DELAYED_RELEASE_TABLET | Freq: Every day | ORAL | Status: DC

## 2015-03-23 MED ORDER — CITALOPRAM HYDROBROMIDE 20 MG PO TABS: 20.0000 mg | ORAL_TABLET | Freq: Every day | ORAL | Status: DC

## 2015-03-23 MED ORDER — MELOXICAM 15 MG PO TABS: 15.0000 mg | ORAL_TABLET | Freq: Every day | ORAL | Status: DC

## 2015-03-23 MED ORDER — GABAPENTIN 300 MG PO CAPS
400.0000 mg | ORAL_CAPSULE | Freq: Three times a day (TID) | ORAL | Status: DC
  Administered 2015-03-23 – 2015-03-24 (×3): 400 mg via ORAL
  Filled 2015-03-23 (×3): qty 1

## 2015-03-23 MED ORDER — TRAZODONE HCL 150 MG PO TABS
150.0000 mg | ORAL_TABLET | Freq: Every day | ORAL | Status: DC
Start: 2015-03-23 — End: 2015-11-01

## 2015-03-23 MED ORDER — LIDOCAINE 5 % EX PTCH: 1.0000 | MEDICATED_PATCH | CUTANEOUS | Status: DC

## 2015-03-23 NOTE — BHH Group Notes (Signed)
[facility] Group Notes:  (Nursing/MHT/Case Management/Adjunct)  Date:  03/23/2015  Time:  10:48 PM  Type of Therapy:  Group Therapy  Participation Level:  Active  Participation Quality:  Appropriate  Affect:  Appropriate  Cognitive:  Appropriate  Insight:  Appropriate  Engagement in Group:  Engaged  Modes of Intervention:  Discussion  Summary of Progress/Problems:  Kandis Fantasia 03/23/2015, 10:48 PM

## 2015-03-23 NOTE — BHH Group Notes (Signed)
[facility] LCSW Group Therapy  03/23/2015 2:53 PM  Type of Therapy:  Group Therapy  Participation Level:  Active  Participation Quality:  Appropriate and Attentive  Affect:  Appropriate  Cognitive:  Alert, Appropriate and Oriented  Insight:  Engaged  Engagement in Therapy:  Engaged  Modes of Intervention:  Discussion, Socialization and Support  Summary of Progress/Problems: Patient participated appropriately in group discussion but was called out early by staff and returned to group. Patient was able to identify obstacles in his life as negative people and sometimes having transportation difficulties as well to get to appts.   Keene Breath, MSW, LCSWA 03/23/2015, 2:53 PM

## 2015-03-23 NOTE — Progress Notes (Signed)
   03/23/15 1500  Clinical Encounter Type  Visited With Patient  Visit Type Initial  Referral From Nurse  Consult/Referral To Chaplain  Spiritual Encounters  Spiritual Needs Literature  Stress Factors  Patient Stress Factors None identified   Chaplain visited with patient concerning a HPOA and patient agreed to meet me tomorrow in medical mall to have it completed upon discharge.   Marjory Lies [phone]

## 2015-03-23 NOTE — Progress Notes (Signed)
[facility] MD Progress Note  03/23/2015 1:26 PM Jesse Obrien  MRN:  [id number] Subjective:   Patient is a 54 year old male who arrived to the ED via EMS on 9/15 from home with self-inflicted laceration to the right side of his neck along with alcohol intoxication. Patient seen for follow-up. He reported that he has started improving.  He is more willing and open to follow-up with outpatient care for mental health and substance abuse. He is also very interested in switching care to the  [place] [facility] where he could receive primary care and psychiatric care.  He denies SI, HI or having auditory and cannot hallucinations. Denies side effects from medications. Denies problems with his sleep, appetite, energy or concentration. Denies withdrawal symptoms. Denies having any physical complaints other than his chronic pain and hip and low back. Feels that the Neurontin and Lidoderm patches have not helped his pain at all.     Principal Problem: Severe recurrent major depression without psychotic features Diagnosis:   Patient Active Problem List   Diagnosis Date Noted  . Alcohol use disorder, severe, dependence [F10.20] 03/20/2015  . Alcohol withdrawal [F10.239] 03/20/2015  . DDD (degenerative disc disease), lumbar [M51.36] 03/20/2015  . Osteoarthrosis, unspecified whether generalized or localized, involving lower leg [M17.9] 03/20/2015  . Spinal stenosis of lumbar region [M48.06] 03/20/2015  . Severe recurrent major depression without psychotic features [F33.2] 03/19/2015  . Self inflicted neck laceration [T14.8] 01/02/2015  . Hypertension [I10] 01/02/2015   Total Time spent with patient: 30 minutes   Past Medical History:  Past Medical History  Diagnosis Date  . Hypertension 01/02/2015   History reviewed. No pertinent past surgical history. Family History: History reviewed. No pertinent family history. Social History:  History  Alcohol Use  . Yes     History  Drug Use No    Social History    Social History  . Marital Status: Married    Spouse Name: N/A  . Number of Children: N/A  . Years of Education: N/A   Social History Main Topics  . Smoking status: Former Smoker -- 15 years  . Smokeless tobacco: None  . Alcohol Use: Yes  . Drug Use: No  . Sexual Activity: Yes    Birth Control/ Protection: Condom   Other Topics Concern  . None   Social History Narrative   Additional History:    Sleep: Good  Appetite:  Good   Assessment:   Musculoskeletal: Strength & Muscle Tone: within normal limits Gait & Station: normal Patient leans: N/A   Psychiatric Specialty Exam: Physical Exam   Review of Systems  Constitutional: Negative.   HENT: Negative.   Eyes: Negative.   Respiratory: Negative.   Cardiovascular: Negative.   Gastrointestinal: Negative.   Genitourinary: Negative.   Musculoskeletal: Positive for back pain and joint pain.  Skin: Negative.   Neurological: Negative.   Endo/Heme/Allergies: Negative.   Psychiatric/Behavioral: Positive for depression and substance abuse. Negative for suicidal ideas and hallucinations. The patient is not nervous/anxious and does not have insomnia.     Blood pressure 112/73, pulse 76, temperature 98.2 F (36.8 C), temperature source Oral, resp. rate 18, height 5\' 10"  (1.778 m), weight 92.987 kg (205 lb), SpO2 98 %.Body mass index is 29.41 kg/(m^2).  General Appearance: Casual and Fairly Groomed  Engineer, water::  Fair  Speech:  Clear and Coherent and Slow  Volume:  Decreased  Mood:  Anxious and Depressed  Affect:  Congruent  Thought Process:  Linear and Logical  Orientation:  Full (Time, Place, and Person)  Thought Content:  WDL  Suicidal Thoughts:  No  Homicidal Thoughts:  No  Memory:  Immediate;   Fair  Judgement:  Fair  Insight:  Lacking  Psychomotor Activity:  Normal  Concentration:  Fair  Recall:  [employer] of Knowledge:Fair  Language: Fair  Akathisia:  No  Handed:  Right  AIMS (if indicated):      Assets:  Armed forces logistics/support/administrative officer Physical Health Social Support  ADL's:  Intact  Cognition: WNL  Sleep:  Number of Hours: 8     Current Medications: Current Facility-Administered Medications  Medication Dose Route Frequency Provider Last Rate Last Dose  . alum & mag hydroxide-simeth (MAALOX/MYLANTA) 200-200-20 MG/5ML suspension 30 mL  30 mL Oral Q4H PRN Gonzella Lex, MD      . chlordiazePOXIDE (LIBRIUM) capsule 25 mg  25 mg Oral TID Rainey Pines, MD   25 mg at 03/23/15 1055  . citalopram (CELEXA) tablet 20 mg  20 mg Oral Daily Hildred Priest, MD   20 mg at 03/23/15 1056  . folic acid (FOLVITE) tablet 1 mg  1 mg Oral Daily Gonzella Lex, MD   1 mg at 03/23/15 1057  . gabapentin (NEURONTIN) capsule 300 mg  300 mg Oral TID Hildred Priest, MD   300 mg at 03/23/15 1055  . lidocaine (LIDODERM) 5 % 1 patch  1 patch Transdermal Q24H Hildred Priest, MD   1 patch at 03/22/15 1321  . LORazepam (ATIVAN) tablet 2 mg  2 mg Oral TID PRN Hildred Priest, MD      . magnesium hydroxide (MILK OF MAGNESIA) suspension 30 mL  30 mL Oral Daily PRN Gonzella Lex, MD      . meloxicam (MOBIC) tablet 15 mg  15 mg Oral Daily Hildred Priest, MD   15 mg at 03/23/15 1055  . multivitamin with minerals tablet 1 tablet  1 tablet Oral Daily Gonzella Lex, MD   1 tablet at 03/23/15 1056  . pantoprazole (PROTONIX) EC tablet 40 mg  40 mg Oral Daily Hildred Priest, MD   40 mg at 03/23/15 1000  . thiamine (VITAMIN B-1) tablet 100 mg  100 mg Oral Daily Hildred Priest, MD   100 mg at 03/23/15 1056  . traZODone (DESYREL) tablet 150 mg  150 mg Oral QHS Hildred Priest, MD   150 mg at 03/22/15 2139    Lab Results:  No results found for this or any previous visit (from the past 48 hour(s)).  Physical Findings: AIMS: Facial and Oral Movements Muscles of Facial Expression: None, normal Lips and Perioral Area: None, normal Jaw: None,  normal Tongue: None, normal,Extremity Movements Upper (arms, wrists, hands, fingers): None, normal Lower (legs, knees, ankles, toes): None, normal, Trunk Movements Neck, shoulders, hips: None, normal, Overall Severity Severity of abnormal movements (highest score from questions above): None, normal Incapacitation due to abnormal movements: None, normal Patient's awareness of abnormal movements (rate only patient's report): No Awareness, Dental Status Current problems with teeth and/or dentures?: Yes Does patient usually wear dentures?: No  CIWA:  CIWA-Ar Total: 0 COWS:     Treatment Plan Summary: Daily contact with patient to assess and evaluate symptoms and progress in treatment and Medication management   Medical Decision Making:  New problem, with additional work up planned, Review of Psycho-Social Stressors (1) and Established Problem, Worsening (2)  Daily contact with patient to assess and evaluate symptoms and progress in treatment and Medication management   54 year old Caucasian male  who has been in our emergency Department 9 times since January. So far this year the patient has had 2 suicidal attempts by self-inflicted injuries to his neck. Patient has past history of alcohol dependence, depression and possible chronic pain  History of major depressive disorder: Continue citalopram 40 mg a day  Insomnia: Continue trazodone 50 mg by mouth daily at bedtime as needed  Alcohol withdrawal : Patient has been is started on a Librium taper, vital signs will be check every 8 hours, CIWA will be check every 8 hours. Ativan 2 mg will be offered when necessary if CIWA is greater than 15. Patient also receiving vitamin means and thiamine.  Chronic pain: Continue Neurontin however I will increase the dose to 400 mg 3 times a day, he will be continued on Celebrex and Lidoderm patch.  Tobacco use disorder : Patient declines from receiving nicotine replacement   Vital signs every 8  hours  Diet low sodium  Precautions and every 15 minute checks and withdrawal precautions  Discharge planning: Patient will be referred for outpatient treatment with the [place] [facility]. We will contact patient's wife for collateral information and plan to discharge tomorrow.     Hildred Priest 03/23/2015, 1:26 PM

## 2015-03-23 NOTE — [facility] Suicide Risk Assessment (Signed)
[facility] Discharge Suicide Risk Assessment   Demographic Factors:  Male and Caucasian  Total Time spent with patient: 30 minutes   Psychiatric Specialty Exam: Physical Exam  ROS  Have you used any form of tobacco in the last 30 days? (Cigarettes, Smokeless Tobacco, Cigars, and/or Pipes): Yes  Has this patient used any form of tobacco in the last 30 days? (Cigarettes, Smokeless Tobacco, Cigars, and/or Pipes) Yes, A prescription for an FDA-approved tobacco cessation medication was offered at discharge and the patient refused  Mental Status Per Nursing Assessment::   On Admission:     Current Mental Status by Physician: much calmer, and less irritable. Attending groups, compliant with medications, sleeping and eating well.  Denies SI, HI or hallucinations.  No access to guns which was confirmed with wife.  Supportive wife  Loss Factors: Decline in physical health  Historical Factors: Prior suicide attempts and Impulsivity  Risk Reduction Factors:   Living with another person, especially a relative and Positive social support  Continued Clinical Symptoms:  Depression:   Aggression Comorbid alcohol abuse/dependence Impulsivity Severe Alcohol/Substance Abuse/Dependencies Chronic Pain More than one psychiatric diagnosis  Cognitive Features That Contribute To Risk:  None    Suicide Risk:  Minimal: No identifiable suicidal ideation.  Patients presenting with no risk factors but with morbid ruminations; may be classified as minimal risk based on the severity of the depressive symptoms  Principal Problem: Severe recurrent major depression without psychotic features Discharge Diagnoses:  Patient Active Problem List   Diagnosis Date Noted  . Alcohol use disorder, severe, dependence [F10.20] 03/20/2015  . Alcohol withdrawal [F10.239] 03/20/2015  . DDD (degenerative disc disease), lumbar [M51.36] 03/20/2015  . Osteoarthrosis, unspecified whether generalized or localized, involving  lower leg [M17.9] 03/20/2015  . Spinal stenosis of lumbar region [M48.06] 03/20/2015  . Severe recurrent major depression without psychotic features [F33.2] 03/19/2015  . Self inflicted neck laceration [T14.8] 01/02/2015  . Hypertension [I10] 01/02/2015    Follow-up Information    Schedule an appointment as soon as possible for a visit with [facility].   Why:  Call to complete eligibility information prior to making appointment for primary care and mental health services.  We recommend, Outpatient Medication Management, Therapy, as well as primary care follow up.   Contact information:   [place] [address] Phone:[phone]       Is patient on multiple antipsychotic therapies at discharge:  No   Has Patient had three or more failed trials of antipsychotic monotherapy by history:  No  Recommended Plan for Multiple Antipsychotic Therapies: NA    Hildred Priest 03/24/2015, 7:09 AM

## 2015-03-23 NOTE — Plan of Care (Signed)
Problem: Ineffective individual coping Goal: LTG: Patient will report a decrease in negative feelings Outcome: Progressing Denies depression and SI

## 2015-03-23 NOTE — [facility] Group Notes (Signed)
[facility] Group Notes:  (Nursing/MHT/Case Management/Adjunct)  Date:  03/23/2015  Time:  2:32 PM  Type of Therapy:  Psychoeducational Skills  Participation Level:  Minimal  Participation Quality:  Appropriate and Pt left 10 min into group and came back with 10 min left of group.  Affect:  Appropriate  Cognitive:  Appropriate  Insight:  Appropriate  Engagement in Group:  Engaged  Modes of Intervention:  Discussion, Education and Support  Summary of Progress/Problems:  Adela Lank Madoni 03/23/2015, 2:32 PM

## 2015-03-23 NOTE — Progress Notes (Signed)
Recreation Therapy Notes  Date: 09.19.16 Time: 3:00 pm Location: Craft Room  Group Topic: Wellness  Goal Area(s) Addresses:  Patient will identify at least one item per each dimension of health. Patient will examine areas they are deficient in.  Behavioral Response: Did not attend  Intervention: 6 Dimensions of Health  Activity: Patients were given a sheet with the definitions of the 6 dimensions. Patients were given a worksheet with the 6 dimensions and were instructed to list at least one item in each dimension.   Education: LRT educated patients on ways to increase each dimension.  Education Outcome: Patient did not attend group.  Clinical Observations/Feedback: Patient did not attend group.  Leonette Monarch, LRT/CTRS 03/23/2015 4:21 PM

## 2015-03-23 NOTE — Progress Notes (Signed)
Denies depression and SI.  Safety maintained.
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
## 2015-03-23 NOTE — Progress Notes (Signed)
D: Pt denies SI/HI/AVH. Pt is pleasant and cooperative. Pt stated he tried to cut neck after he was drinking. Pt stated " I would never cut myself sober"   A: Pt was offered support and encouragement. Pt was given scheduled medications. Pt was encourage to attend groups. Q 15 minute checks were done for safety.   R:Pt attends groups and interacts well with peers and staff. Pt is taking medication. Pt has no complaints at this time .Pt receptive to treatment and safety maintained on unit.

## 2015-03-24 DIAGNOSIS — T148 Other injury of unspecified body region: Secondary | ICD-10-CM

## 2015-03-24 MED ORDER — CEPHALEXIN 500 MG PO CAPS: 500.0000 mg | ORAL_CAPSULE | Freq: Three times a day (TID) | ORAL | Status: DC

## 2015-03-24 MED ORDER — MUPIROCIN 2 % EX OINT: TOPICAL_OINTMENT | Freq: Two times a day (BID) | CUTANEOUS | Status: DC

## 2015-03-24 MED ORDER — CEPHALEXIN 500 MG PO CAPS
500.0000 mg | ORAL_CAPSULE | Freq: Three times a day (TID) | ORAL | Status: DC
  Administered 2015-03-24: 500 mg via ORAL
  Filled 2015-03-24 (×2): qty 1

## 2015-03-24 MED ORDER — MUPIROCIN 2 % EX OINT
TOPICAL_OINTMENT | Freq: Two times a day (BID) | CUTANEOUS | Status: DC
  Administered 2015-03-24: 12:00:00 via NASAL
  Filled 2015-03-24: qty 22

## 2015-03-24 NOTE — Consult Note (Signed)
Patient ID: Jesse Obrien, male   DOB: March 01, 1961, 54 y.o.   MRN: [id number] CC: Neck Wound HPI Jesse Obrien is a 54 y.o. male admitted to the behavioral health unit due to recent self inflicted neck wound. Discussed with patient and reviewed with chart found to be wound to his right lateral neck created by a straight razor. Patient focus on how he thinks the emergency department physician showed of "stitched it or stapled it up when I first came here". Patient is worried that the area has become infected because of its size. He's had some drainage from the area however it is remained clear and he has wants to make sure it is taking care of correctly. He is otherwise reporting that he is being discharged by behavioral health today. Denies fevers, chills, nausea, vomiting, shortness of breath, chest pain, diarrhea, constipation.  HPI  Past Medical History  Diagnosis Date  . Hypertension 01/02/2015    History reviewed. No pertinent past surgical history.  History reviewed. No pertinent family history.  Social History Social History  Substance Use Topics  . Smoking status: Former Smoker -- 15 years  . Smokeless tobacco: None  . Alcohol Use: Yes    No Known Allergies  Current Facility-Administered Medications  Medication Dose Route Frequency Provider Last Rate Last Dose  . alum & mag hydroxide-simeth (MAALOX/MYLANTA) 200-200-20 MG/5ML suspension 30 mL  30 mL Oral Q4H PRN Gonzella Lex, MD      . cephALEXin (KEFLEX) capsule 500 mg  500 mg Oral 3 times per day Hildred Priest, MD   500 mg at 03/24/15 0958  . citalopram (CELEXA) tablet 20 mg  20 mg Oral Daily Hildred Priest, MD   20 mg at 03/24/15 1000  . gabapentin (NEURONTIN) capsule 400 mg  400 mg Oral TID Hildred Priest, MD   400 mg at 03/24/15 0959  . lidocaine (LIDODERM) 5 % 1 patch  1 patch Transdermal Q24H Hildred Priest, MD   1 patch at 03/24/15 1000  . magnesium hydroxide (MILK OF  MAGNESIA) suspension 30 mL  30 mL Oral Daily PRN Gonzella Lex, MD      . meloxicam (MOBIC) tablet 15 mg  15 mg Oral Daily Hildred Priest, MD   15 mg at 03/24/15 0959  . mupirocin ointment (BACTROBAN) 2 %   Nasal BID Hildred Priest, MD      . pantoprazole (PROTONIX) EC tablet 40 mg  40 mg Oral Daily Hildred Priest, MD   40 mg at 03/24/15 1002  . traZODone (DESYREL) tablet 150 mg  150 mg Oral QHS Hildred Priest, MD   150 mg at 03/23/15 2121     Review of Systems A multi- point review of systems was asked and was negative except for the following positive findings: See history of present illness  Physical Exam Blood pressure 110/77, pulse 91, temperature 98.2 F (36.8 C), temperature source Oral, resp. rate 18, height 5\' 10"  (1.778 m), weight 92.987 kg (205 lb), SpO2 98 %. CONSTITUTIONAL: No acute distress. EYES: Pupils are equal, round, and reactive to light, Sclera are non-icteric. EARS, NOSE, MOUTH AND THROAT: The oropharynx is clear. The oral mucosa is pink and moist. Hearing is intact to voice. LYMPH NODES:  Lymph nodes in the neck are normal. RESPIRATORY:  There is normal respiratory effort without pathologic use of accessory muscles. CARDIOVASCULAR: Heart is regular without murmurs, gallops, or rubs. GI: The abdomen is  soft, nontender, and nondistended.  GU: Rectal deferred.   MUSCULOSKELETAL:  Normal muscle strength and tone. No cyanosis or edema.   SKIN: Right neck with a 3 cm laceration and local hyperemia. No purulent drainage. No palpable fluctuance. NEUROLOGIC: Motor and sensation is grossly normal. Cranial nerves are grossly intact. PSYCH:  Oriented to person, place and time. Affect is normal.  Data Reviewed  I have personally reviewed the patient's imaging, laboratory findings and medical records.    Assessment    54 year old male with a right neck wound    Plan    Given the size, location, time duration since creation of  this wound it was discussed with patient that the most prudent course of action is local wound care. To close this wound at this time would invite an abscess formation. Discussed with patient the importance of personal hygiene to include bathing with soap and water. The areas to be kept clean and dry between showers. Provided with simple dressings of plain gauze and tape to be used for the next week or until all drainage stops. Once there is no longer any drainage will be appropriate to transition to a standard adhesive bandage. No indications for surgical intervention at this time. No requirement for surgical follow-up.     Time spent with the patient was 30 minutes, with more than 50% of the time spent in face-to-face education, counseling and care coordination.     Clayburn Pert 03/24/2015, 12:29 PM

## 2015-03-24 NOTE — Progress Notes (Signed)
Patient ID: Jesse Obrien, male   DOB: 1961-01-27, 54 y.o.   MRN: [id number] Patient was discharged home with wife and taken to chaplain to get advanced directive.  Patient denied SI/HI/AVH.  Instructions reviewed.  Patient verbalized understanding of medications and follow up appointment and belongings returned.

## 2015-03-24 NOTE — Progress Notes (Signed)
Client has been calm and cooperative; denies having any suicidal thoughts; states that, he is looking forward to being discharged."

## 2015-03-26 NOTE — [facility] Suicide Risk Assessment (Signed)
[name] INPATIENT:  Family/Significant Other Suicide Prevention Education  Suicide Prevention Education:  Education Completed; Jesse Obrien (wife) [phone] has been identified by the patient as the family member/significant other with whom the patient will be residing, and identified as the person(s) who will aid the patient in the event of a mental health crisis (suicidal ideations/suicide attempt).  With written consent from the patient, the family member/significant other has been provided the following suicide prevention education, prior to the and/or following the discharge of the patient.  The suicide prevention education provided includes the following:  Suicide risk factors  Suicide prevention and interventions  National Suicide Hotline telephone number  [facility] assessment telephone number  [facility] Emergency Assistance [place] and/or Residential Mobile Crisis Unit telephone number  Request made of family/significant other to:  Remove weapons (e.g., guns, rifles, knives), all items previously/currently identified as safety concern.    Remove drugs/medications (over-the-counter, prescriptions, illicit drugs), all items previously/currently identified as a safety concern.  The family member/significant other verbalizes understanding of the suicide prevention education information provided.  The family member/significant other agrees to remove the items of safety concern listed above.  [place] MSW, [place]  03/26/2015, 9:41 AM

## 2015-03-26 NOTE — Progress Notes (Signed)
  [facility] Adult Case Management Discharge Plan :  Will you be returning to the same living situation after discharge:  Yes,    At discharge, do you have transportation home?: Yes,    Do you have the ability to pay for your medications: Yes,     Release of information consent forms completed and in the chart;  Patient's signature needed at discharge.  Patient to Follow up at: Follow-up Information    Schedule an appointment as soon as possible for a visit with [facility].   Why:  Call to complete eligibility information prior to making appointment for primary care and mental health services.  We recommend, Outpatient Medication Management, Therapy, as well as primary care follow up.   Contact information:   [place] [address] Phone:[phone] [phone]      Follow up with [facility].   Why:  If for some reason you are unable to get in with the [facility] can serve you. Walk-ins Monday-Friday between 9am-4pm., Hospital Follow up, Outpatient Medication Management, Therapy   Contact information:   [place] [place] [address] fax-[phone] EHMCN:[phone]      Patient denies SI/HI: Yes,       Safety Planning and Suicide Prevention discussed: Yes,     Have you used any form of tobacco in the last 30 days? (Cigarettes, Smokeless Tobacco, Cigars, and/or Pipes): Yes  Has patient been referred to the Quitline?: Patient refused referral  August Saucer, MSW, LCSW 03/26/2015, 11:18 AM

## 2015-07-07 ENCOUNTER — Emergency Department
Admission: EM | Admit: 2015-07-07 | Discharge: 2015-07-07 | Disposition: A | Discharge status: Home / Self Care | Attending: Emergency Medicine | Admitting: Emergency Medicine

## 2015-07-07 ENCOUNTER — Emergency Department

## 2015-07-07 DIAGNOSIS — Z79899 Other long term (current) drug therapy: Secondary | ICD-10-CM | POA: Insufficient documentation

## 2015-07-07 DIAGNOSIS — W01198A Fall on same level from slipping, tripping and stumbling with subsequent striking against other object, initial encounter: Secondary | ICD-10-CM | POA: Diagnosis not present

## 2015-07-07 DIAGNOSIS — S20411A Abrasion of right back wall of thorax, initial encounter: Secondary | ICD-10-CM | POA: Insufficient documentation

## 2015-07-07 DIAGNOSIS — M546 Pain in thoracic spine: Secondary | ICD-10-CM

## 2015-07-07 DIAGNOSIS — Z792 Long term (current) use of antibiotics: Secondary | ICD-10-CM | POA: Insufficient documentation

## 2015-07-07 DIAGNOSIS — Z87891 Personal history of nicotine dependence: Secondary | ICD-10-CM | POA: Insufficient documentation

## 2015-07-07 DIAGNOSIS — I10 Essential (primary) hypertension: Secondary | ICD-10-CM | POA: Insufficient documentation

## 2015-07-07 DIAGNOSIS — Z791 Long term (current) use of non-steroidal anti-inflammatories (NSAID): Secondary | ICD-10-CM | POA: Diagnosis not present

## 2015-07-07 DIAGNOSIS — Y9289 Other specified places as the place of occurrence of the external cause: Secondary | ICD-10-CM | POA: Insufficient documentation

## 2015-07-07 DIAGNOSIS — M545 Low back pain: Secondary | ICD-10-CM

## 2015-07-07 DIAGNOSIS — Y9389 Activity, other specified: Secondary | ICD-10-CM | POA: Diagnosis not present

## 2015-07-07 DIAGNOSIS — M5441 Lumbago with sciatica, right side: Secondary | ICD-10-CM | POA: Diagnosis not present

## 2015-07-07 DIAGNOSIS — Y998 Other external cause status: Secondary | ICD-10-CM | POA: Diagnosis not present

## 2015-07-07 DIAGNOSIS — T148XXA Other injury of unspecified body region, initial encounter: Secondary | ICD-10-CM

## 2015-07-07 DIAGNOSIS — S3992XA Unspecified injury of lower back, initial encounter: Secondary | ICD-10-CM | POA: Diagnosis present

## 2015-07-07 MED ORDER — LIDOCAINE 5 % EX PTCH
1.0000 | MEDICATED_PATCH | CUTANEOUS | Status: DC
  Administered 2015-07-07: 1 via TRANSDERMAL
  Filled 2015-07-07 (×2): qty 1

## 2015-07-07 MED ORDER — HYDROMORPHONE HCL 1 MG/ML IJ SOLN
INTRAMUSCULAR | Status: AC
  Filled 2015-07-07: qty 1

## 2015-07-07 MED ORDER — KETOROLAC TROMETHAMINE 60 MG/2ML IM SOLN
60.0000 mg | Freq: Once | INTRAMUSCULAR | Status: AC
  Administered 2015-07-07: 60 mg via INTRAMUSCULAR
  Filled 2015-07-07: qty 2

## 2015-07-07 MED ORDER — LIDOCAINE 5 % EX PTCH: 1.0000 | MEDICATED_PATCH | Freq: Two times a day (BID) | CUTANEOUS | Status: DC

## 2015-07-07 MED ORDER — ONDANSETRON 4 MG PO TBDP
4.0000 mg | ORAL_TABLET | Freq: Once | ORAL | Status: AC
  Administered 2015-07-07: 4 mg via ORAL
  Filled 2015-07-07: qty 1

## 2015-07-07 MED ORDER — OXYCODONE-ACETAMINOPHEN 5-325 MG PO TABS: 1.0000 | ORAL_TABLET | Freq: Four times a day (QID) | ORAL | Status: DC | PRN

## 2015-07-07 MED ORDER — MORPHINE SULFATE (PF) 4 MG/ML IV SOLN
4.0000 mg | Freq: Once | INTRAVENOUS | Status: AC
  Administered 2015-07-07: 4 mg via INTRAMUSCULAR
  Filled 2015-07-07: qty 1

## 2015-07-07 MED ORDER — OXYCODONE-ACETAMINOPHEN 5-325 MG PO TABS
1.0000 | ORAL_TABLET | Freq: Once | ORAL | Status: AC
  Administered 2015-07-07: 1 via ORAL
  Filled 2015-07-07: qty 1

## 2015-07-07 MED ORDER — DIAZEPAM 5 MG PO TABS
5.0000 mg | ORAL_TABLET | Freq: Once | ORAL | Status: AC
  Administered 2015-07-07: 5 mg via ORAL
  Filled 2015-07-07: qty 1

## 2015-07-07 MED ORDER — DIAZEPAM 5 MG PO TABS: 5.0000 mg | ORAL_TABLET | Freq: Three times a day (TID) | ORAL | Status: DC | PRN

## 2015-07-07 NOTE — Discharge Instructions (Signed)
Radicular Pain Radicular pain in either the arm or leg is usually from a bulging or herniated disk in the spine. A piece of the herniated disk may press against the nerves as the nerves exit the spine. This causes pain which is felt at the tips of the nerves down the arm or leg. Other causes of radicular pain may include:  Fractures.  Heart disease.  Cancer.  An abnormal and usually degenerative state of the nervous system or nerves (neuropathy). Diagnosis may require CT or MRI scanning to determine the primary cause.  Nerves that start at the neck (nerve roots) may cause radicular pain in the outer shoulder and arm. It can spread down to the thumb and fingers. The symptoms vary depending on which nerve root has been affected. In most cases radicular pain improves with conservative treatment. Neck problems may require physical therapy, a neck collar, or cervical traction. Treatment may take many weeks, and surgery may be considered if the symptoms do not improve.  Conservative treatment is also recommended for sciatica. Sciatica causes pain to radiate from the lower back or buttock area down the leg into the foot. Often there is a history of back problems. Most patients with sciatica are better after 2 to 4 weeks of rest and other supportive care. Short term bed rest can reduce the disk pressure considerably. Sitting, however, is not a good position since this increases the pressure on the disk. You should avoid bending, lifting, and all other activities which make the problem worse. Traction can be used in severe cases. Surgery is usually reserved for patients who do not improve within the first months of treatment. Only take over-the-counter or prescription medicines for pain, discomfort, or fever as directed by your caregiver. Narcotics and muscle relaxants may help by relieving more severe pain and spasm and by providing mild sedation. Cold or massage can give significant relief. Spinal manipulation  is not recommended. It can increase the degree of disc protrusion. Epidural steroid injections are often effective treatment for radicular pain. These injections deliver medicine to the spinal nerve in the space between the protective covering of the spinal cord and back bones (vertebrae). Your caregiver can give you more information about steroid injections. These injections are most effective when given within two weeks of the onset of pain.  You should see your caregiver for follow up care as recommended. A program for neck and back injury rehabilitation with stretching and strengthening exercises is an important part of management.  SEEK IMMEDIATE MEDICAL CARE IF:  You develop increased pain, weakness, or numbness in your arm or leg.  You develop difficulty with bladder or bowel control.  You develop abdominal pain.   This information is not intended to replace advice given to you by your health care provider. Make sure you discuss any questions you have with your health care provider.   Document Released: 07/28/2004 Document Revised: 07/11/2014 Document Reviewed: 01/14/2015 Elsevier Interactive Patient Education 2016 Elsevier Inc.  Back Pain, Adult Back pain is very common in adults.The cause of back pain is rarely dangerous and the pain often gets better over time.The cause of your back pain may not be known. Some common causes of back pain include:  Strain of the muscles or ligaments supporting the spine.  Wear and tear (degeneration) of the spinal disks.  Arthritis.  Direct injury to the back. For many people, back pain may return. Since back pain is rarely dangerous, most people can learn to manage this condition  on their own. HOME CARE INSTRUCTIONS Watch your back pain for any changes. The following actions may help to lessen any discomfort you are feeling:  Remain active. It is stressful on your back to sit or stand in one place for long periods of time. Do not sit, drive,  or stand in one place for more than 30 minutes at a time. Take short walks on even surfaces as soon as you are able.Try to increase the length of time you walk each day.  Exercise regularly as directed by your health care provider. Exercise helps your back heal faster. It also helps avoid future injury by keeping your muscles strong and flexible.  Do not stay in bed.Resting more than 1-2 days can delay your recovery.  Pay attention to your body when you bend and lift. The most comfortable positions are those that put less stress on your recovering back. Always use proper lifting techniques, including:  Bending your knees.  Keeping the load close to your body.  Avoiding twisting.  Find a comfortable position to sleep. Use a firm mattress and lie on your side with your knees slightly bent. If you lie on your back, put a pillow under your knees.  Avoid feeling anxious or stressed.Stress increases muscle tension and can worsen back pain.It is important to recognize when you are anxious or stressed and learn ways to manage it, such as with exercise.  Take medicines only as directed by your health care provider. Over-the-counter medicines to reduce pain and inflammation are often the most helpful.Your health care provider may prescribe muscle relaxant drugs.These medicines help dull your pain so you can more quickly return to your normal activities and healthy exercise.  Apply ice to the injured area:  Put ice in a plastic bag.  Place a towel between your skin and the bag.  Leave the ice on for 20 minutes, 2-3 times a day for the first 2-3 days. After that, ice and heat may be alternated to reduce pain and spasms.  Maintain a healthy weight. Excess weight puts extra stress on your back and makes it difficult to maintain good posture. SEEK MEDICAL CARE IF:  You have pain that is not relieved with rest or medicine.  You have increasing pain going down into the legs or buttocks.  You  have pain that does not improve in one week.  You have night pain.  You lose weight.  You have a fever or chills. SEEK IMMEDIATE MEDICAL CARE IF:   You develop new bowel or bladder control problems.  You have unusual weakness or numbness in your arms or legs.  You develop nausea or vomiting.  You develop abdominal pain.  You feel faint.   This information is not intended to replace advice given to you by your health care provider. Make sure you discuss any questions you have with your health care provider.   Document Released: 06/20/2005 Document Revised: 07/11/2014 Document Reviewed: 10/22/2013 Elsevier Interactive Patient Education [employer].

## 2015-07-07 NOTE — ED Notes (Signed)
Pt in with co lower back pain after falling yesterday, radiates into buttocks.

## 2015-07-07 NOTE — ED Provider Notes (Signed)
[facility] Emergency Department Provider Note  ____________________________________________  Time seen: Approximately 143 AM  I have reviewed the triage vital signs and the nursing notes.   HISTORY  Chief Complaint Back Pain    HPI Jesse Obrien is a 55 y.o. male who comes into the hospital with some back pain. The patient reports that he fell yesterday. The patient was sitting in his truck listening to music and got out and reports that he fell. The patient fell onto the lower portion of his truck and hit the right side of his back. He reports that he has a bruise on his back and he has some pain shooting down into his right buttock. The patient reports also that the area feels very sensitive and burning as well as tingling. The patient rates his pain a 7 out of 10 in intensity. The patient took 6 Tylenol today and reports that has not helped at all. The patient comes into the hospital for evaluation today.   Past Medical History  Diagnosis Date  . Hypertension 01/02/2015    Patient Active Problem List   Diagnosis Date Noted  . Alcohol use disorder, severe, dependence ([place]) 03/20/2015  . Alcohol withdrawal ([place]) 03/20/2015  . DDD (degenerative disc disease), lumbar 03/20/2015  . Osteoarthrosis, unspecified whether generalized or localized, involving lower leg 03/20/2015  . Spinal stenosis of lumbar region 03/20/2015  . Severe recurrent major depression without psychotic features ([place]) 03/19/2015  . Self inflicted neck laceration 01/02/2015  . Hypertension 01/02/2015    History reviewed. No pertinent past surgical history.  Current Outpatient Rx  Name  Route  Sig  Dispense  Refill  . cephALEXin (KEFLEX) 500 MG capsule   Oral   Take 1 capsule (500 mg total) by mouth every 8 (eight) hours.   30 capsule   0   . citalopram (CELEXA) 20 MG tablet   Oral   Take 1 tablet (20 mg total) by mouth daily.   30 tablet   0   . diazepam (VALIUM) 5 MG  tablet   Oral   Take 1 tablet (5 mg total) by mouth every 8 (eight) hours as needed for anxiety.   30 tablet   0   . gabapentin (NEURONTIN) 400 MG capsule   Oral   Take 1 capsule (400 mg total) by mouth 3 (three) times daily.   90 capsule   0   . lidocaine (LIDODERM) 5 %   Transdermal   Place 1 patch onto the skin daily. Remove & Discard patch within 12 hours or as directed by MD   30 patch   0   . lidocaine (LIDODERM) 5 %   Transdermal   Place 1 patch onto the skin every 12 (twelve) hours. Remove & Discard patch within 12 hours or as directed by MD   10 patch   0   . meloxicam (MOBIC) 15 MG tablet   Oral   Take 1 tablet (15 mg total) by mouth daily.   30 tablet   0   . mupirocin ointment (BACTROBAN) 2 %   Nasal   Place into the nose 2 (two) times daily.   22 g   0   . oxyCODONE-acetaminophen (ROXICET) 5-325 MG tablet   Oral   Take 1 tablet by mouth every 6 (six) hours as needed.   12 tablet   0   . pantoprazole (PROTONIX) 40 MG tablet   Oral   Take 1 tablet (40 mg total)  by mouth daily.   30 tablet   0   . traZODone (DESYREL) 150 MG tablet   Oral   Take 1 tablet (150 mg total) by mouth at bedtime.   30 tablet   0     Allergies Review of patient's allergies indicates no known allergies.  No family history on file.  Social History Social History  Substance Use Topics  . Smoking status: Former Smoker -- 15 years  . Smokeless tobacco: None  . Alcohol Use: Yes    Review of Systems Constitutional: No fever/chills Eyes: No visual changes. ENT: No sore throat. Cardiovascular: Denies chest pain. Respiratory: Denies shortness of breath. Gastrointestinal: No abdominal pain.  No nausea, no vomiting.  No diarrhea.  No constipation. Genitourinary: Negative for dysuria. Musculoskeletal: back pain. Skin: Negative for rash. Neurological: Negative for headaches, focal weakness or numbness.  10-point ROS otherwise  negative.  ____________________________________________   PHYSICAL EXAM:  VITAL SIGNS: ED Triage Vitals  Enc Vitals Group     BP 07/07/15 0114 149/98 mmHg     Pulse --      Resp 07/07/15 0114 18     Temp 07/07/15 0114 98.2 F (36.8 C)     Temp Source 07/07/15 0114 Oral     SpO2 07/07/15 0114 100 %     Weight 07/07/15 0114 210 lb (95.255 kg)     Height 07/07/15 0114 5\' 10"  (1.778 m)     Head Cir --      Peak Flow --      Pain Score 07/07/15 0115 10     Pain Loc --      Pain Edu? --      Excl. in [place]? --     Constitutional: Alert and oriented. Well appearing and in moderate distress. Eyes: Conjunctivae are normal. PERRL. EOMI. Head: Atraumatic. Nose: No congestion/rhinnorhea. Mouth/Throat: Mucous membranes are moist.  Oropharynx non-erythematous. Cardiovascular: Normal rate, regular rhythm. Grossly normal heart sounds.  Good peripheral circulation. Respiratory: Normal respiratory effort.  No retractions. Lungs CTAB. Gastrointestinal: Soft and nontender. No distention. Positive bowel sounds Musculoskeletal: Right sided tenderness to palpation along back with a positive straight leg raise on the right. Patient has some pain at the right SI joint as well. 2 spots of midline tenderness in low back and in thoracic spine.   Neurologic:  Normal speech and language. No gross focal neurologic deficits are appreciated.  Skin:  Skin is warm and dry. Abrasion to right side of back Psychiatric: Mood and affect are normal.   ____________________________________________   LABS (all labs ordered are listed, but only abnormal results are displayed)  Labs Reviewed - No data to display ____________________________________________  EKG  None ____________________________________________  RADIOLOGY  Lumbar spine x-ray: Degenerative change in the lumbar spine without acute fracture or subluxation  Thoracic spine x-ray: Degenerative change in the thoracic spine without evidence of acute  fracture ____________________________________________   PROCEDURES  Procedure(s) performed: None  Critical Care performed: No  ____________________________________________   INITIAL IMPRESSION / ASSESSMENT AND PLAN / ED COURSE  Pertinent labs & imaging results that were available during my care of the patient were reviewed by me and considered in my medical decision making (see chart for details).  This is a 55 year old male who comes in the hospital with a fall yesterday and some back pain today. I will give the patient is shot of morphine and Toradol as well as give him some Zofran. I will give the patient some Valium as well and  reassess his pain.  The patient had some improvement in his pain. I gave him a lidoderm patch as well as a percocet. I explained to the patient that he would have some pain but it would subside over the coming days.I also informed him that he needs to follow up with his primary care physician. The patient will be discharged to home to follow up with his PCP for his musculoskeletal back pain and abrasion.  ____________________________________________   FINAL CLINICAL IMPRESSION(S) / ED DIAGNOSES  Final diagnoses:  Right-sided low back pain with right-sided sciatica  Abrasion  Back pain of thoracolumbar region      Loney Hering, MD 07/07/15 [phone]

## 2015-07-14 ENCOUNTER — Emergency Department
Admission: EM | Admit: 2015-07-14 | Discharge: 2015-07-14 | Disposition: A | Discharge status: Home / Self Care | Attending: Emergency Medicine | Admitting: Emergency Medicine

## 2015-07-14 ENCOUNTER — Encounter: Payer: Self-pay | Admitting: *Deleted

## 2015-07-14 DIAGNOSIS — G8929 Other chronic pain: Secondary | ICD-10-CM | POA: Insufficient documentation

## 2015-07-14 DIAGNOSIS — Y9389 Activity, other specified: Secondary | ICD-10-CM | POA: Diagnosis not present

## 2015-07-14 DIAGNOSIS — Z79899 Other long term (current) drug therapy: Secondary | ICD-10-CM | POA: Insufficient documentation

## 2015-07-14 DIAGNOSIS — S3992XA Unspecified injury of lower back, initial encounter: Secondary | ICD-10-CM | POA: Diagnosis not present

## 2015-07-14 DIAGNOSIS — I10 Essential (primary) hypertension: Secondary | ICD-10-CM | POA: Diagnosis not present

## 2015-07-14 DIAGNOSIS — Z87891 Personal history of nicotine dependence: Secondary | ICD-10-CM | POA: Insufficient documentation

## 2015-07-14 DIAGNOSIS — Y9289 Other specified places as the place of occurrence of the external cause: Secondary | ICD-10-CM | POA: Insufficient documentation

## 2015-07-14 DIAGNOSIS — Z791 Long term (current) use of non-steroidal anti-inflammatories (NSAID): Secondary | ICD-10-CM | POA: Insufficient documentation

## 2015-07-14 DIAGNOSIS — W1839XA Other fall on same level, initial encounter: Secondary | ICD-10-CM | POA: Diagnosis not present

## 2015-07-14 DIAGNOSIS — Z792 Long term (current) use of antibiotics: Secondary | ICD-10-CM | POA: Insufficient documentation

## 2015-07-14 DIAGNOSIS — Y998 Other external cause status: Secondary | ICD-10-CM | POA: Diagnosis not present

## 2015-07-14 DIAGNOSIS — M549 Dorsalgia, unspecified: Secondary | ICD-10-CM

## 2015-07-14 HISTORY — DX: Unspecified osteoarthritis, unspecified site: M19.90

## 2015-07-14 MED ORDER — IBUPROFEN 400 MG PO TABS: 600.0000 mg | ORAL_TABLET | Freq: Once | ORAL | Status: DC

## 2015-07-14 NOTE — ED Provider Notes (Addendum)
[facility] Emergency Department Provider Note  ____________________________________________   I have reviewed the triage vital signs and the nursing notes.   HISTORY  Chief Complaint Fall and Back Pain    HPI Jesse Obrien is a 55 y.o. male states he has been drinking beer today.  He came in for back pain. He is taking a nice nap in the room. He wakes up to verbal stimuli. He would not initially tell me why he is here, it seems if he forgot. I then reminded him it was for back pain he states yes by back is hurting". He then went back to sleep. Patient is in no acute distress.   Past Medical History  Diagnosis Date  . Hypertension 01/02/2015  . DJD (degenerative joint disease)     Patient Active Problem List   Diagnosis Date Noted  . Alcohol use disorder, severe, dependence ([place]) 03/20/2015  . Alcohol withdrawal ([place]) 03/20/2015  . DDD (degenerative disc disease), lumbar 03/20/2015  . Osteoarthrosis, unspecified whether generalized or localized, involving lower leg 03/20/2015  . Spinal stenosis of lumbar region 03/20/2015  . Severe recurrent major depression without psychotic features ([place]) 03/19/2015  . Self inflicted neck laceration 01/02/2015  . Hypertension 01/02/2015    History reviewed. No pertinent past surgical history.  Current Outpatient Rx  Name  Route  Sig  Dispense  Refill  . cephALEXin (KEFLEX) 500 MG capsule   Oral   Take 1 capsule (500 mg total) by mouth every 8 (eight) hours.   30 capsule   0   . citalopram (CELEXA) 20 MG tablet   Oral   Take 1 tablet (20 mg total) by mouth daily.   30 tablet   0   . diazepam (VALIUM) 5 MG tablet   Oral   Take 1 tablet (5 mg total) by mouth every 8 (eight) hours as needed for anxiety.   30 tablet   0   . gabapentin (NEURONTIN) 400 MG capsule   Oral   Take 1 capsule (400 mg total) by mouth 3 (three) times daily.   90 capsule   0   . lidocaine (LIDODERM) 5 %   Transdermal   Place  1 patch onto the skin daily. Remove & Discard patch within 12 hours or as directed by MD   30 patch   0   . lidocaine (LIDODERM) 5 %   Transdermal   Place 1 patch onto the skin every 12 (twelve) hours. Remove & Discard patch within 12 hours or as directed by MD   10 patch   0   . meloxicam (MOBIC) 15 MG tablet   Oral   Take 1 tablet (15 mg total) by mouth daily.   30 tablet   0   . mupirocin ointment (BACTROBAN) 2 %   Nasal   Place into the nose 2 (two) times daily.   22 g   0   . oxyCODONE-acetaminophen (ROXICET) 5-325 MG tablet   Oral   Take 1 tablet by mouth every 6 (six) hours as needed.   12 tablet   0   . pantoprazole (PROTONIX) 40 MG tablet   Oral   Take 1 tablet (40 mg total) by mouth daily.   30 tablet   0   . traZODone (DESYREL) 150 MG tablet   Oral   Take 1 tablet (150 mg total) by mouth at bedtime.   30 tablet   0     Allergies Review of patient's  allergies indicates no known allergies.  History reviewed. No pertinent family history.  Social History Social History  Substance Use Topics  . Smoking status: Former Smoker -- 15 years  . Smokeless tobacco: None  . Alcohol Use: Yes    Review of Systems Cannot fully obtain secondary to patient intoxication  ____________________________________________   PHYSICAL EXAM:  VITAL SIGNS: ED Triage Vitals  Enc Vitals Group     BP 07/14/15 1350 178/98 mmHg     Pulse Rate 07/14/15 1350 109     Resp 07/14/15 1350 18     Temp 07/14/15 1350 98.1 F (36.7 C)     Temp Source 07/14/15 1350 Oral     SpO2 07/14/15 1350 91 %     Weight 07/14/15 1350 210 lb (95.255 kg)     Height 07/14/15 1350 5\' 10"  (1.778 m)     Head Cir --      Peak Flow --      Pain Score 07/14/15 1350 6     Pain Loc --      Pain Edu? --      Excl. in [place]? --     Constitutional:Patient sound asleep but easily arouses to verbal stimuli in no acute distress with no evidence of discomfort  Eyes: Conjunctivae are normal. PERRL.  EOMI. Head: Atraumatic. Nose: No congestion/rhinnorhea. Mouth/Throat: Mucous membranes are moist.  Oropharynx non-erythematous. Neck: No stridor.   Nontender with no meningismus Cardiovascular: Normal rate, regular rhythm. Grossly normal heart sounds.  Good peripheral circulation. Respiratory: Normal respiratory effort.  No retractions. Lungs CTAB. Abdominal: Soft and nontender. No distention. No guarding no rebound Back:  There is no focal tenderness or step off there is no midline tenderness there are no lesions noted. there is no CVA tenderness, Patient does not seem to be bothered by deep palpation to his low back.  Musculoskeletal: No lower extremity tenderness. No joint effusions, no DVT signs strong distal pulses no edema Neurologic: Noted per dissipation with exam, moves all fours.  Skin:  Skin is warm, dry and intact. No rash noted. Psychiatric: Mood and affect aresomnolent. Speech and behavior are normal.  ____________________________________________   LABS (all labs ordered are listed, but only abnormal results are displayed)  Labs Reviewed - No data to display ____________________________________________  EKG  I personally interpreted any EKGs ordered by me or triage  ____________________________________________  RADIOLOGY  I reviewed any imaging ordered by me or triage that were performed during my shift ____________________________________________   PROCEDURES  Procedure(s) performed: None  Critical Care performed: None  ____________________________________________   INITIAL IMPRESSION / ASSESSMENT AND PLAN / ED COURSE  Pertinent labs & imaging results that were available during my care of the patient were reviewed by me and considered in my medical decision making (see chart for details).  Patient here allegedly for back pain that he is actually just taking a nap and has been drinking. His been here many times for alcohol abuse. Obviously we are not not  going to give him any pain medications at this time. We will let him sleep for a while and see how he does. There is no evidence of closed head injury or other acute pathology. Patient states he had several  beers and his clinical exam is consistent with that.. Denies recent fall.  ----------------------------------------- 3:23 PM on 07/14/2015 -----------------------------------------  Patient now ambulatory to the department requesting narcotic pain medication. We will give him something for pain. If he is stable on his feet we will  discharge him. No acute medical issue noted at this time. There was loss and saturation of 91 noted however patient was sleepy. Oxygen saturation at discharge was 97% heart rate was 89,  ----------------------------------------- 3:45 PM on 07/14/2015 -----------------------------------------  Patient ambulates with no difficulty at discharge, declines offered pain medication will follow-up as an outpatient. Return precautions and follow-up given and understood.  ----------------------------------------- 3:34 PM on 07/14/2015 -----------------------------------------  Patient is demanding IV narcotic pain medication I will not give that to him he is very angry and upset about this. He is not clinically intoxicated at this time, able and bili with a steady gait he is tolerating by mouth we will discharge him. He is neurologically intact. ____________________________________________   FINAL CLINICAL IMPRESSION(S) / ED DIAGNOSES  Final diagnoses:  None     Schuyler Amor, MD 07/14/15 1454  Schuyler Amor, MD 07/14/15 [place], MD 07/14/15 [place], MD 07/14/15 [phone]

## 2015-07-14 NOTE — ED Notes (Signed)
Pt is intoxicated and c/o back pain. He states he has a hx of spinal stenosis.  [place] on New Years Day and has been drinking alcohol to help with pain.  Pain increased with movement.

## 2015-07-14 NOTE — ED Notes (Signed)
Pt c/o back pain. Did not understand why he cannot have narcotics after drinking ETOH today. Dr. Burlene Arnt talked to pt. Pt very upset that he was not getting narcotics here or a prescription for narcotics to take home. [facility] [place] md told him that we could discharge him and he could go to his primary care md. Pt was discharged and Amy, [employer], helped pt to phone in lobby.

## 2015-07-14 NOTE — ED Notes (Addendum)
Pt arrives via EMS from home, pt states new years day and is now complaning of lower back pain, states he drank 12 beers today to control the pain, denies any SI or HI but yelling and billegerent

## 2015-09-20 ENCOUNTER — Emergency Department
Admission: EM | Admit: 2015-09-20 | Discharge: 2015-09-20 | Disposition: A | Discharge status: Home / Self Care | Attending: Emergency Medicine | Admitting: Emergency Medicine

## 2015-09-20 ENCOUNTER — Emergency Department

## 2015-09-20 ENCOUNTER — Encounter: Payer: Self-pay | Admitting: Medical Oncology

## 2015-09-20 DIAGNOSIS — G8929 Other chronic pain: Secondary | ICD-10-CM | POA: Diagnosis not present

## 2015-09-20 DIAGNOSIS — Z791 Long term (current) use of non-steroidal anti-inflammatories (NSAID): Secondary | ICD-10-CM | POA: Insufficient documentation

## 2015-09-20 DIAGNOSIS — Y9389 Activity, other specified: Secondary | ICD-10-CM | POA: Insufficient documentation

## 2015-09-20 DIAGNOSIS — I10 Essential (primary) hypertension: Secondary | ICD-10-CM | POA: Insufficient documentation

## 2015-09-20 DIAGNOSIS — S32029A Unspecified fracture of second lumbar vertebra, initial encounter for closed fracture: Secondary | ICD-10-CM | POA: Insufficient documentation

## 2015-09-20 DIAGNOSIS — Y9289 Other specified places as the place of occurrence of the external cause: Secondary | ICD-10-CM | POA: Diagnosis not present

## 2015-09-20 DIAGNOSIS — M549 Dorsalgia, unspecified: Secondary | ICD-10-CM

## 2015-09-20 DIAGNOSIS — S32049A Unspecified fracture of fourth lumbar vertebra, initial encounter for closed fracture: Secondary | ICD-10-CM | POA: Insufficient documentation

## 2015-09-20 DIAGNOSIS — F10220 Alcohol dependence with intoxication, uncomplicated: Secondary | ICD-10-CM

## 2015-09-20 DIAGNOSIS — S32039A Unspecified fracture of third lumbar vertebra, initial encounter for closed fracture: Secondary | ICD-10-CM | POA: Diagnosis not present

## 2015-09-20 DIAGNOSIS — Z87891 Personal history of nicotine dependence: Secondary | ICD-10-CM | POA: Insufficient documentation

## 2015-09-20 DIAGNOSIS — M545 Low back pain: Secondary | ICD-10-CM | POA: Diagnosis present

## 2015-09-20 DIAGNOSIS — S32009A Unspecified fracture of unspecified lumbar vertebra, initial encounter for closed fracture: Secondary | ICD-10-CM

## 2015-09-20 DIAGNOSIS — Y998 Other external cause status: Secondary | ICD-10-CM | POA: Diagnosis not present

## 2015-09-20 DIAGNOSIS — Z79899 Other long term (current) drug therapy: Secondary | ICD-10-CM | POA: Insufficient documentation

## 2015-09-20 DIAGNOSIS — X58XXXA Exposure to other specified factors, initial encounter: Secondary | ICD-10-CM | POA: Diagnosis not present

## 2015-09-20 DIAGNOSIS — F10229 Alcohol dependence with intoxication, unspecified: Secondary | ICD-10-CM | POA: Insufficient documentation

## 2015-09-20 DIAGNOSIS — S32019A Unspecified fracture of first lumbar vertebra, initial encounter for closed fracture: Secondary | ICD-10-CM | POA: Insufficient documentation

## 2015-09-20 LAB — URINALYSIS COMPLETE WITH MICROSCOPIC (ARMC ONLY)
BACTERIA UA: NONE SEEN
Bilirubin Urine: NEGATIVE
Glucose, UA: NEGATIVE mg/dL
KETONES UR: NEGATIVE mg/dL
Leukocytes, UA: NEGATIVE
Nitrite: NEGATIVE
PROTEIN: NEGATIVE mg/dL
SQUAMOUS EPITHELIAL / LPF: NONE SEEN
Specific Gravity, Urine: 1.001 — ABNORMAL LOW (ref 1.005–1.030)
pH: 6 (ref 5.0–8.0)

## 2015-09-20 MED ORDER — SODIUM CHLORIDE 0.9 % IV BOLUS (SEPSIS)
1000.0000 mL | Freq: Once | INTRAVENOUS | Status: AC
  Administered 2015-09-20: 1000 mL via INTRAVENOUS

## 2015-09-20 MED ORDER — KETOROLAC TROMETHAMINE 30 MG/ML IJ SOLN
30.0000 mg | Freq: Once | INTRAMUSCULAR | Status: AC
  Administered 2015-09-20: 30 mg via INTRAMUSCULAR
  Filled 2015-09-20: qty 1

## 2015-09-20 NOTE — Discharge Instructions (Signed)
Chronic Back Pain  When back pain lasts longer than 3 months, it is called chronic back pain.People with chronic back pain often go through certain periods that are more intense (flare-ups).  CAUSES Chronic back pain can be caused by wear and tear (degeneration) on different structures in your back. These structures include:  The bones of your spine (vertebrae) and the joints surrounding your spinal cord and nerve roots (facets).  The strong, fibrous tissues that connect your vertebrae (ligaments). Degeneration of these structures may result in pressure on your nerves. This can lead to constant pain. HOME CARE INSTRUCTIONS  Avoid bending, heavy lifting, prolonged sitting, and activities which make the problem worse.  Take brief periods of rest throughout the day to reduce your pain. Lying down or standing usually is better than sitting while you are resting.  Take over-the-counter or prescription medicines only as directed by your caregiver. SEEK IMMEDIATE MEDICAL CARE IF:   You have weakness or numbness in one of your legs or feet.  You have trouble controlling your bladder or bowels.  You have nausea, vomiting, abdominal pain, shortness of breath, or fainting.   This information is not intended to replace advice given to you by your health care provider. Make sure you discuss any questions you have with your health care provider.   Document Released: 07/28/2004 Document Revised: 09/12/2011 Document Reviewed: 12/08/2014 Elsevier Interactive Patient Education 2016 [employer].  Alcohol Use Disorder Alcohol use disorder is a mental disorder. It is not a one-time incident of heavy drinking. Alcohol use disorder is the excessive and uncontrollable use of alcohol over time that leads to problems with functioning in one or more areas of daily living. People with this disorder risk harming themselves and others when they drink to excess. Alcohol use disorder also can cause other mental  disorders, such as mood and anxiety disorders, and serious physical problems. People with alcohol use disorder often misuse other drugs.  Alcohol use disorder is common and widespread. Some people with this disorder drink alcohol to cope with or escape from negative life events. Others drink to relieve chronic pain or symptoms of mental illness. People with a family history of alcohol use disorder are at higher risk of losing control and using alcohol to excess.  Drinking too much alcohol can cause injury, accidents, and health problems. One drink can be too much when you are:  Working.  Pregnant or breastfeeding.  Taking medicines. Ask your doctor.  Driving or planning to drive. SYMPTOMS  Signs and symptoms of alcohol use disorder may include the following:   Consumption ofalcohol inlarger amounts or over a longer period of time than intended.  Multiple unsuccessful attempts to cutdown or control alcohol use.   A great deal of time spent obtaining alcohol, using alcohol, or recovering from the effects of alcohol (hangover).  A strong desire or urge to use alcohol (cravings).   Continued use of alcohol despite problems at work, school, or home because of alcohol use.   Continued use of alcohol despite problems in relationships because of alcohol use.  Continued use of alcohol in situations when it is physically hazardous, such as driving a car.  Continued use of alcohol despite awareness of a physical or psychological problem that is likely related to alcohol use. Physical problems related to alcohol use can involve the brain, heart, liver, stomach, and intestines. Psychological problems related to alcohol use include intoxication, depression, anxiety, psychosis, delirium, and dementia.   The need for increased amounts  of alcohol to achieve the same desired effect, or a decreased effect from the consumption of the same amount of alcohol (tolerance).  Withdrawal symptoms upon  reducing or stopping alcohol use, or alcohol use to reduce or avoid withdrawal symptoms. Withdrawal symptoms include:  Racing heart.  Hand tremor.  Difficulty sleeping.  Nausea.  Vomiting.  Hallucinations.  Restlessness.  Seizures. DIAGNOSIS Alcohol use disorder is diagnosed through an assessment by your health care provider. Your health care provider may start by asking three or four questions to screen for excessive or problematic alcohol use. To confirm a diagnosis of alcohol use disorder, at least two symptoms must be present within a 97-month period. The severity of alcohol use disorder depends on the number of symptoms:  Mild--two or three.  Moderate--four or five.  Severe--six or more. Your health care provider may perform a physical exam or use results from lab tests to see if you have physical problems resulting from alcohol use. Your health care provider may refer you to a mental health professional for evaluation. TREATMENT  Some people with alcohol use disorder are able to reduce their alcohol use to low-risk levels. Some people with alcohol use disorder need to quit drinking alcohol. When necessary, mental health professionals with specialized training in substance use treatment can help. Your health care provider can help you decide how severe your alcohol use disorder is and what type of treatment you need. The following forms of treatment are available:   Detoxification. Detoxification involves the use of prescription medicines to prevent alcohol withdrawal symptoms in the first week after quitting. This is important for people with a history of symptoms of withdrawal and for heavy drinkers who are likely to have withdrawal symptoms. Alcohol withdrawal can be dangerous and, in severe cases, cause death. Detoxification is usually provided in a hospital or in-patient substance use treatment facility.  Counseling or talk therapy. Talk therapy is provided by substance use  treatment counselors. It addresses the reasons people use alcohol and ways to keep them from drinking again. The goals of talk therapy are to help people with alcohol use disorder find healthy activities and ways to cope with life stress, to identify and avoid triggers for alcohol use, and to handle cravings, which can cause relapse.  Medicines.Different medicines can help treat alcohol use disorder through the following actions:  Decrease alcohol cravings.  Decrease the positive reward response felt from alcohol use.  Produce an uncomfortable physical reaction when alcohol is used (aversion therapy).  Support groups. Support groups are run by people who have quit drinking. They provide emotional support, advice, and guidance. These forms of treatment are often combined. Some people with alcohol use disorder benefit from intensive combination treatment provided by specialized substance use treatment centers. Both inpatient and outpatient treatment programs are available.   This information is not intended to replace advice given to you by your health care provider. Make sure you discuss any questions you have with your health care provider.   Document Released: 07/28/2004 Document Revised: 07/11/2014 Document Reviewed: 09/27/2012 Elsevier Interactive Patient Education [employer].

## 2015-09-20 NOTE — ED Provider Notes (Signed)
[facility] Emergency Department Provider Note  ____________________________________________  Time seen: Approximately 7:52 AM  I have reviewed the triage vital signs and the nursing notes.   HISTORY  Chief Complaint Pain    HPI Jesse Obrien is a 55 y.o. male , NAD, presents emergency Department with 1 month history of right middle back pain. Patient does endorse that he is intoxicated at this time and has been drinking beer last night as well as this morning. He also states he has chronic back pain related to his [employer]. Was seen in the [place] [facility] in [place] for his back pain and received steroid injections which she states helped for 1 month. Has not returned to that facility for follow-up at this time. States he's very concerned about this right middle back pain.  States he drinks alcohol to help with his pain because over-the-counter medications don't help. He denies any chest pain but does endorse right upper quadrant and left upper quadrant abdominal pain from time to time. He is not having of any abdominal pain at this time. Over the last month he has had the right middle back pain radiating into the flank, at times. Denies dysuria, hematuria. Has had no nausea, vomiting, diarrhea. When asked if he wanted assistance with his alcohol abuse he stated "let's leave that question open".     Past Medical History  Diagnosis Date  . Hypertension 01/02/2015  . DJD (degenerative joint disease)     Patient Active Problem List   Diagnosis Date Noted  . Alcohol use disorder, severe, dependence ([place]) 03/20/2015  . Alcohol withdrawal ([place]) 03/20/2015  . DDD (degenerative disc disease), lumbar 03/20/2015  . Osteoarthrosis, unspecified whether generalized or localized, involving lower leg 03/20/2015  . Spinal stenosis of lumbar region 03/20/2015  . Severe recurrent major depression without psychotic features ([place]) 03/19/2015  . Self inflicted neck  laceration 01/02/2015  . Hypertension 01/02/2015    History reviewed. No pertinent past surgical history.  Current Outpatient Rx  Name  Route  Sig  Dispense  Refill  . cephALEXin (KEFLEX) 500 MG capsule   Oral   Take 1 capsule (500 mg total) by mouth every 8 (eight) hours.   30 capsule   0   . citalopram (CELEXA) 20 MG tablet   Oral   Take 1 tablet (20 mg total) by mouth daily.   30 tablet   0   . diazepam (VALIUM) 5 MG tablet   Oral   Take 1 tablet (5 mg total) by mouth every 8 (eight) hours as needed for anxiety.   30 tablet   0   . gabapentin (NEURONTIN) 400 MG capsule   Oral   Take 1 capsule (400 mg total) by mouth 3 (three) times daily.   90 capsule   0   . lidocaine (LIDODERM) 5 %   Transdermal   Place 1 patch onto the skin daily. Remove & Discard patch within 12 hours or as directed by MD   30 patch   0   . lidocaine (LIDODERM) 5 %   Transdermal   Place 1 patch onto the skin every 12 (twelve) hours. Remove & Discard patch within 12 hours or as directed by MD   10 patch   0   . meloxicam (MOBIC) 15 MG tablet   Oral   Take 1 tablet (15 mg total) by mouth daily.   30 tablet   0   . mupirocin ointment (BACTROBAN) 2 %  Nasal   Place into the nose 2 (two) times daily.   22 g   0   . oxyCODONE-acetaminophen (ROXICET) 5-325 MG tablet   Oral   Take 1 tablet by mouth every 6 (six) hours as needed.   12 tablet   0   . pantoprazole (PROTONIX) 40 MG tablet   Oral   Take 1 tablet (40 mg total) by mouth daily.   30 tablet   0   . traZODone (DESYREL) 150 MG tablet   Oral   Take 1 tablet (150 mg total) by mouth at bedtime.   30 tablet   0     Allergies Review of patient's allergies indicates no known allergies.  No family history on file.  Social History Social History  Substance Use Topics  . Smoking status: Former Smoker -- 15 years  . Smokeless tobacco: None  . Alcohol Use: Yes     Review of Systems  Constitutional: No fever/chills,  fatigue. Eyes: No visual changes.  Cardiovascular: No chest pain. Respiratory: No cough. No shortness of breath. No wheezing.  Gastrointestinal: No abdominal pain.  No nausea, vomiting.  No diarrhea.  No constipation. Genitourinary: Negative for dysuria, hematuria. No urinary hesitancy, urgency or increased frequency. Musculoskeletal: Positive for back pain.  Skin: Negative for rash. Neurological: Negative for headaches, focal weakness or numbness. 10-point ROS otherwise negative.  ____________________________________________   PHYSICAL EXAM:  VITAL SIGNS: ED Triage Vitals  Enc Vitals Group     BP 09/20/15 0747 122/88 mmHg     Pulse Rate 09/20/15 0747 100     Resp 09/20/15 0747 19     Temp 09/20/15 0747 97.8 F (36.6 C)     Temp Source 09/20/15 0747 Oral     SpO2 09/20/15 0747 98 %     Weight 09/20/15 0747 206 lb (93.441 kg)     Height 09/20/15 0747 5\' 11"  (1.803 m)     Head Cir --      Peak Flow --      Pain Score 09/20/15 0748 7     Pain Loc --      Pain Edu? --      Excl. in [place]? --     Constitutional: Alert and oriented. Well appearing and in no acute distress. Slurred speech but follows conversation moderately well.  Eyes: Conjunctivae are normal. PERRL. EOMI without pain.  Head: Atraumatic. Neck: Supple with FROM Hematological/Lymphatic/Immunilogical: No cervical lymphadenopathy. Cardiovascular: Normal rate, regular rhythm. Grossly normal S1 and S2.  Good peripheral circulation. Respiratory: Normal respiratory effort without tachypnea or retractions. Lungs CTAB. Gastrointestinal: Soft and nontender in all quadrants. No distention, guarding. No CVA tenderness. Musculoskeletal: No pain to palpation about the cervical, thoracic, lumbar or sacral spine to deep palpation. No muscle spasms evident to palpation.  FROM of lumbar spine.  No lower extremity tenderness nor edema.  No joint effusions. Neurologic:  Normal speech and language. No gross focal neurologic deficits  are appreciated.  Skin:  4 inch scar noted to the right lower back (patient notes this was a laceration from years ago). Skin is warm, dry and intact. No rash, bruising, swelling, skin sores noted. Psychiatric: Mood and affect are normal for alcohol intoxication with slight blunting. Speech is slurred but coherent.    ____________________________________________   LABS (all labs ordered are listed, but only abnormal results are displayed)  Labs Reviewed  URINALYSIS COMPLETEWITH MICROSCOPIC ([place] ONLY) - Abnormal; Notable for the following:    Color, Urine COLORLESS (*)  APPearance CLEAR (*)    Specific Gravity, Urine 1.001 (*)    Hgb urine dipstick 1+ (*)    All other components within normal limits   ____________________________________________  EKG  None ____________________________________________  RADIOLOGY I have personally viewed and evaluated these images (plain radiographs) as part of my medical decision making, as well as reviewing the written report by the radiologist.  Ct Renal Stone Study  09/20/2015  CLINICAL DATA:  Right low back pain and hematuria. EXAM: CT ABDOMEN AND PELVIS WITHOUT CONTRAST TECHNIQUE: Multidetector CT imaging of the abdomen and pelvis was performed following the standard protocol without IV contrast. COMPARISON:  None. FINDINGS: Lower chest: No significant pulmonary nodules or acute consolidative airspace disease. Hepatobiliary: Moderate diffuse hepatic steatosis. No liver mass. Tiny subcentimeter densities layering in the nondistended gallbladder could represent tiny gallstones. No gallbladder wall thickening or pericholecystic fluid. No biliary ductal dilatation. Pancreas: Normal, with no mass or duct dilation. Spleen: Normal size. No mass. Adrenals/Urinary Tract: Normal adrenals. No hydronephrosis. No renal stones. Normal caliber ureters, with no ureteral stones. Simple appearing partially exophytic 1.2 cm renal cyst in the anterior interpolar left  kidney. Additional subcentimeter hypodense renal cortical lesions in both kidneys are too small to characterize. Moderately distended and otherwise normal bladder with no bladder wall thickening. No bladder stones. Stomach/Bowel: Grossly normal stomach. Normal caliber small bowel with no small bowel wall thickening. Normal appendix. Normal large bowel with no diverticulosis, large bowel wall thickening or pericolonic fat stranding. Vascular/Lymphatic: Normal caliber mildly atherosclerotic abdominal aorta. No pathologically enlarged lymph nodes in the abdomen or pelvis. Reproductive: Normal size prostate with nonspecific internal prostatic calcifications. Other: No pneumoperitoneum, ascites or focal fluid collection. Musculoskeletal: No aggressive appearing focal osseous lesions. Bilateral hip osteoarthritis. There are non-displaced healing fractures of the right L1, L2, L3 and L4 transverse processes. Moderate degenerative changes in the visualized thoracolumbar spine. IMPRESSION: 1. Nondisplaced subacute healing fractures of the right L1, L2, L3 and L4 transverse processes. Correlate with injury history. 2. Otherwise no acute abnormality. No urolithiasis. No hydronephrosis. No evidence of bowel obstruction or acute bowel inflammation. 3. Moderate diffuse hepatic steatosis. 4. Possible cholelithiasis. Electronically Signed   By: Ilona Sorrel M.D.   On: 09/20/2015 10:20    ____________________________________________    PROCEDURES  Procedure(s) performed: None    Medications  ketorolac (TORADOL) 30 MG/ML injection 30 mg (30 mg Intramuscular Given 09/20/15 1058)  sodium chloride 0.9 % bolus 1,000 mL (1,000 mLs Intravenous New Bag/Given 09/20/15 1231)     ____________________________________________   INITIAL IMPRESSION / ASSESSMENT AND PLAN / ED COURSE  ----------------------------------------- 9:28 AM on 09/20/2015 -----------------------------------------  I entered the room to discuss  urinalysis results with the patient. He was noted to be laying in the bed asleep. He was awoken to verbal stimuli. He was notified of urinalysis results and discussed completing a CT stone protocol to further evaluate back/flank pain. Patient agreed to such and continues to point to the right lower/mid back and endorse pain in this area.  ----------------------------------------- 10:53 AM on 09/20/2015 -----------------------------------------  I spoke with Dr. Edgar Frisk in regards to the patient's physical exam and imaging results. At this time fractures are healing and required no acute intervention. Is advised that the patient have family come to the emergency department to pick him up so that he can safely be transported home. I entered the patient's exam room to discuss CT results with the patient. He was noted to be laying on his right side asleep, snoring  in the exam bed. He was woken to verbal stimuli. He was notified of the CT results and cannot recall any recent falls. He does note that his wife couldn't pick him up from the emergency department her phone number should be in his records. He does consent to me calling his wife and asking for her to come to the emergency department. Also discussed we can give him an injection of medication to help with his pain and patient agreed to such.  ----------------------------------------- 11:09 AM on 09/20/2015 -----------------------------------------  I have attempted to call the patient's wife via the phone numbers in the demographic section of his chart. At the answer machine on the home number and no voicemail was set up on the cell phone. Will continue to have the patient rest and sleep in this emergency department until he is clinically sober. Will continue to attempt to contact his wife.  ----------------------------------------- 12:23 PM on 09/20/2015 -----------------------------------------  Patient is still sleeping and snoring in  his exam room. Patient has consented to IV fluids, therefore 1053mL saline IV has been ordered. I was able to speak to the husband's wife, April and she is available to come to the emergency department to pick up her husband.  ----------------------------------------- 1:11 PM on 09/20/2015 -----------------------------------------  Patient's wife, April, has arrived in the emergency department and is ready to transport her husband home. ED course was discussed with the patient, who is alert and awake, and his wife prior to discharge and all understand all results within physical examination findings.   Pertinent labs & imaging results that were available during my care of the patient were reviewed by me and considered in my medical decision making (see chart for details).  Patient's diagnosis is consistent with subacute fractures of the lumbar spine that are healing and history of chronic lower back pain. Patient will be discharged home with instructions to follow-up with the [facility] in [facility] for further pain management.  Patient encouraged to seek treatment for alcohol abuse and dependence.  Patient and his wife are given ED precautions to return to the ED for any worsening or new symptoms.    ____________________________________________  FINAL CLINICAL IMPRESSION(S) / ED DIAGNOSES  Final diagnoses:  Closed fracture of lumbar vertebra, unspecified fracture morphology, unspecified lumbar vertebral level, initial encounter ([place])  Chronic back pain  Alcohol dependence with uncomplicated intoxication ([place])      NEW MEDICATIONS STARTED DURING THIS VISIT:  New Prescriptions   No medications on file         Braxton Feathers, PA-C 09/20/15 1313  Lavonia Drafts, MD 09/20/15 [phone]

## 2015-09-20 NOTE — ED Notes (Signed)
Patient transported to CT 

## 2015-09-20 NOTE — ED Notes (Signed)
Pt ambulatory to triage with reports that he has chronic lower back, mid back and neck pain. Pt reports that he drinks etoh to help with pain bc meds do not help. Pt does NOT want detox only wants help managing pain. Pt denies SI/HI.

## 2015-10-28 ENCOUNTER — Encounter: Payer: Self-pay | Admitting: Emergency Medicine

## 2015-10-28 ENCOUNTER — Emergency Department
Admission: EM | Admit: 2015-10-28 | Discharge: 2015-10-28 | Disposition: A | Discharge status: Other Acute Inpatient Hospital | Attending: Emergency Medicine | Admitting: Emergency Medicine

## 2015-10-28 ENCOUNTER — Inpatient Hospital Stay
Admission: EM | Admit: 2015-10-28 | Discharge: 2015-11-01 | DRG: 885 | Disposition: A | Discharge status: Home / Self Care | Source: Intra-hospital | Attending: Psychiatry | Admitting: Psychiatry

## 2015-10-28 DIAGNOSIS — F1994 Other psychoactive substance use, unspecified with psychoactive substance-induced mood disorder: Secondary | ICD-10-CM | POA: Diagnosis present

## 2015-10-28 DIAGNOSIS — I10 Essential (primary) hypertension: Secondary | ICD-10-CM | POA: Diagnosis present

## 2015-10-28 DIAGNOSIS — F1020 Alcohol dependence, uncomplicated: Secondary | ICD-10-CM | POA: Diagnosis present

## 2015-10-28 DIAGNOSIS — F10939 Alcohol use, unspecified with withdrawal, unspecified: Secondary | ICD-10-CM | POA: Diagnosis present

## 2015-10-28 DIAGNOSIS — Z811 Family history of alcohol abuse and dependence: Secondary | ICD-10-CM | POA: Diagnosis not present

## 2015-10-28 DIAGNOSIS — S162XXA Laceration of muscle, fascia and tendon at neck level, initial encounter: Secondary | ICD-10-CM | POA: Insufficient documentation

## 2015-10-28 DIAGNOSIS — Z915 Personal history of self-harm: Secondary | ICD-10-CM

## 2015-10-28 DIAGNOSIS — Z046 Encounter for general psychiatric examination, requested by authority: Secondary | ICD-10-CM

## 2015-10-28 DIAGNOSIS — Y939 Activity, unspecified: Secondary | ICD-10-CM | POA: Insufficient documentation

## 2015-10-28 DIAGNOSIS — F10239 Alcohol dependence with withdrawal, unspecified: Secondary | ICD-10-CM | POA: Diagnosis present

## 2015-10-28 DIAGNOSIS — L089 Local infection of the skin and subcutaneous tissue, unspecified: Secondary | ICD-10-CM

## 2015-10-28 DIAGNOSIS — K219 Gastro-esophageal reflux disease without esophagitis: Secondary | ICD-10-CM | POA: Diagnosis present

## 2015-10-28 DIAGNOSIS — Y929 Unspecified place or not applicable: Secondary | ICD-10-CM | POA: Diagnosis not present

## 2015-10-28 DIAGNOSIS — X789XXA Intentional self-harm by unspecified sharp object, initial encounter: Secondary | ICD-10-CM | POA: Insufficient documentation

## 2015-10-28 DIAGNOSIS — F17200 Nicotine dependence, unspecified, uncomplicated: Secondary | ICD-10-CM | POA: Diagnosis present

## 2015-10-28 DIAGNOSIS — R4589 Other symptoms and signs involving emotional state: Secondary | ICD-10-CM | POA: Diagnosis present

## 2015-10-28 DIAGNOSIS — F39 Unspecified mood [affective] disorder: Secondary | ICD-10-CM | POA: Diagnosis not present

## 2015-10-28 DIAGNOSIS — F10129 Alcohol abuse with intoxication, unspecified: Secondary | ICD-10-CM | POA: Diagnosis not present

## 2015-10-28 DIAGNOSIS — Z9119 Patient's noncompliance with other medical treatment and regimen: Secondary | ICD-10-CM

## 2015-10-28 DIAGNOSIS — Z91199 Patient's noncompliance with other medical treatment and regimen due to unspecified reason: Secondary | ICD-10-CM

## 2015-10-28 DIAGNOSIS — Y999 Unspecified external cause status: Secondary | ICD-10-CM | POA: Insufficient documentation

## 2015-10-28 DIAGNOSIS — IMO0002 Reserved for concepts with insufficient information to code with codable children: Secondary | ICD-10-CM

## 2015-10-28 DIAGNOSIS — F333 Major depressive disorder, recurrent, severe with psychotic symptoms: Principal | ICD-10-CM | POA: Diagnosis present

## 2015-10-28 DIAGNOSIS — F10230 Alcohol dependence with withdrawal, uncomplicated: Secondary | ICD-10-CM | POA: Diagnosis present

## 2015-10-28 DIAGNOSIS — R4689 Other symptoms and signs involving appearance and behavior: Secondary | ICD-10-CM

## 2015-10-28 DIAGNOSIS — G8929 Other chronic pain: Secondary | ICD-10-CM | POA: Diagnosis present

## 2015-10-28 DIAGNOSIS — F10929 Alcohol use, unspecified with intoxication, unspecified: Secondary | ICD-10-CM

## 2015-10-28 DIAGNOSIS — X838XXA Intentional self-harm by other specified means, initial encounter: Secondary | ICD-10-CM

## 2015-10-28 LAB — COMPREHENSIVE METABOLIC PANEL
ALBUMIN: 4.6 g/dL (ref 3.5–5.0)
ALT: 54 U/L (ref 17–63)
AST: 51 U/L — AB (ref 15–41)
Alkaline Phosphatase: 71 U/L (ref 38–126)
Anion gap: 8 (ref 5–15)
BUN: 7 mg/dL (ref 6–20)
CHLORIDE: 102 mmol/L (ref 101–111)
CO2: 24 mmol/L (ref 22–32)
Calcium: 8.8 mg/dL — ABNORMAL LOW (ref 8.9–10.3)
Creatinine, Ser: 0.85 mg/dL (ref 0.61–1.24)
GFR calc Af Amer: >60 mL/min (ref >60)
Glucose, Bld: 118 mg/dL — ABNORMAL HIGH (ref 65–99)
POTASSIUM: 3.4 mmol/L — AB (ref 3.5–5.1)
SODIUM: 134 mmol/L — AB (ref 135–145)
Total Bilirubin: 0.4 mg/dL (ref 0.3–1.2)
Total Protein: 8.1 g/dL (ref 6.5–8.1)

## 2015-10-28 LAB — SALICYLATE LEVEL: Salicylate Lvl: <4 mg/dL (ref 2.8–30.0)

## 2015-10-28 LAB — URINE DRUG SCREEN, QUALITATIVE (ARMC ONLY)
AMPHETAMINES, UR SCREEN: NOT DETECTED
Barbiturates, Ur Screen: NOT DETECTED
Benzodiazepine, Ur Scrn: NOT DETECTED
COCAINE METABOLITE, UR ~~LOC~~: NOT DETECTED
Cannabinoid 50 Ng, Ur ~~LOC~~: NOT DETECTED
MDMA (ECSTASY) UR SCREEN: NOT DETECTED
Methadone Scn, Ur: NOT DETECTED
Opiate, Ur Screen: NOT DETECTED
PHENCYCLIDINE (PCP) UR S: NOT DETECTED
TRICYCLIC, UR SCREEN: NOT DETECTED

## 2015-10-28 LAB — CBC
HCT: 48.8 % (ref 40.0–52.0)
HEMOGLOBIN: 16.8 g/dL (ref 13.0–18.0)
MCH: 33.6 pg (ref 26.0–34.0)
MCHC: 34.6 g/dL (ref 32.0–36.0)
MCV: 97.1 fL (ref 80.0–100.0)
PLATELETS: 178 10*3/uL (ref 150–440)
RBC: 5.02 MIL/uL (ref 4.40–5.90)
RDW: 13 % (ref 11.5–14.5)
WBC: 11.2 10*3/uL — AB (ref 3.8–10.6)

## 2015-10-28 LAB — ETHANOL: ALCOHOL ETHYL (B): 278 mg/dL — AB (ref <5)

## 2015-10-28 LAB — ACETAMINOPHEN LEVEL: Acetaminophen (Tylenol), Serum: <10 ug/mL — ABNORMAL LOW (ref 10–30)

## 2015-10-28 MED ORDER — PANTOPRAZOLE SODIUM 40 MG PO TBEC
40.0000 mg | DELAYED_RELEASE_TABLET | Freq: Every day | ORAL | Status: DC
  Administered 2015-10-29 – 2015-11-01 (×4): 40 mg via ORAL
  Filled 2015-10-28 (×4): qty 1

## 2015-10-28 MED ORDER — ASPIRIN 81 MG PO CHEW
324.0000 mg | CHEWABLE_TABLET | Freq: Once | ORAL | Status: AC
  Administered 2015-10-28: 324 mg via ORAL
  Filled 2015-10-28: qty 4

## 2015-10-28 MED ORDER — LORAZEPAM 2 MG PO TABS: 0.0000 mg | ORAL_TABLET | Freq: Four times a day (QID) | ORAL | Status: DC

## 2015-10-28 MED ORDER — PANTOPRAZOLE SODIUM 40 MG PO TBEC
40.0000 mg | DELAYED_RELEASE_TABLET | Freq: Every day | ORAL | Status: DC
  Administered 2015-10-28: 40 mg via ORAL
  Filled 2015-10-28: qty 1

## 2015-10-28 MED ORDER — TRAZODONE HCL 100 MG PO TABS
100.0000 mg | ORAL_TABLET | Freq: Every day | ORAL | Status: DC
Start: 2015-10-28 — End: 2015-11-01
  Administered 2015-10-28 – 2015-10-31 (×4): 100 mg via ORAL
  Filled 2015-10-28 (×4): qty 1

## 2015-10-28 MED ORDER — LIDOCAINE HCL (PF) 1 % IJ SOLN
INTRAMUSCULAR | Status: AC
  Administered 2015-10-28: 5 mL via INTRADERMAL
  Filled 2015-10-28: qty 5

## 2015-10-28 MED ORDER — ASPIRIN 325 MG PO TABS
ORAL_TABLET | ORAL | Status: AC
  Filled 2015-10-28: qty 1

## 2015-10-28 MED ORDER — CITALOPRAM HYDROBROMIDE 20 MG PO TABS
20.0000 mg | ORAL_TABLET | Freq: Every day | ORAL | Status: DC
  Administered 2015-10-28: 20 mg via ORAL
  Filled 2015-10-28: qty 1

## 2015-10-28 MED ORDER — LORAZEPAM 2 MG PO TABS: 0.0000 mg | ORAL_TABLET | Freq: Two times a day (BID) | ORAL | Status: DC

## 2015-10-28 MED ORDER — ACETAMINOPHEN 325 MG PO TABS
650.0000 mg | ORAL_TABLET | Freq: Four times a day (QID) | ORAL | Status: DC | PRN
  Administered 2015-10-28 – 2015-10-29 (×2): 650 mg via ORAL
  Filled 2015-10-28 (×2): qty 2

## 2015-10-28 MED ORDER — LORAZEPAM 2 MG PO TABS
0.0000 mg | ORAL_TABLET | Freq: Two times a day (BID) | ORAL | Status: AC
  Administered 2015-10-30: 1 mg via ORAL
  Administered 2015-10-31: 2 mg via ORAL
  Filled 2015-10-28: qty 1

## 2015-10-28 MED ORDER — LORAZEPAM 2 MG PO TABS
0.0000 mg | ORAL_TABLET | Freq: Four times a day (QID) | ORAL | Status: AC
  Filled 2015-10-28: qty 1

## 2015-10-28 MED ORDER — LIDOCAINE HCL (PF) 1 % IJ SOLN
5.0000 mL | Freq: Once | INTRAMUSCULAR | Status: AC
  Administered 2015-10-28: 5 mL via INTRADERMAL

## 2015-10-28 MED ORDER — MAGNESIUM HYDROXIDE 400 MG/5ML PO SUSP
30.0000 mL | Freq: Every day | ORAL | Status: DC | PRN
Start: 2015-10-28 — End: 2015-11-01

## 2015-10-28 MED ORDER — GABAPENTIN 400 MG PO CAPS
400.0000 mg | ORAL_CAPSULE | Freq: Three times a day (TID) | ORAL | Status: DC
  Administered 2015-10-28: 400 mg via ORAL
  Filled 2015-10-28: qty 1

## 2015-10-28 MED ORDER — CHLORDIAZEPOXIDE HCL 25 MG PO CAPS
25.0000 mg | ORAL_CAPSULE | Freq: Four times a day (QID) | ORAL | Status: AC
  Administered 2015-10-28 – 2015-10-31 (×11): 25 mg via ORAL
  Filled 2015-10-28 (×11): qty 1

## 2015-10-28 MED ORDER — CITALOPRAM HYDROBROMIDE 20 MG PO TABS
20.0000 mg | ORAL_TABLET | Freq: Every day | ORAL | Status: DC
  Administered 2015-10-29 – 2015-10-31 (×3): 20 mg via ORAL
  Filled 2015-10-28 (×4): qty 1

## 2015-10-28 MED ORDER — ALUM & MAG HYDROXIDE-SIMETH 200-200-20 MG/5ML PO SUSP: 30.0000 mL | ORAL | Status: DC | PRN

## 2015-10-28 MED ORDER — GABAPENTIN 300 MG PO CAPS
400.0000 mg | ORAL_CAPSULE | Freq: Three times a day (TID) | ORAL | Status: DC
  Administered 2015-10-28 – 2015-11-01 (×11): 400 mg via ORAL
  Filled 2015-10-28 (×12): qty 1

## 2015-10-28 NOTE — Progress Notes (Signed)
Admission Note:  D: Pt appeared depressed  With  a flat affect.  Pt  denies SI / AVH at this time Patient voice of drinking 2 6 packs   Every 4 days History of hypertensin. Patient has Chron's Disease. Patient cut throat in front of [employer]. Patient receive 6 stiches .    Pt is redirectable and cooperative with assessment.      A: Pt admitted to unit per protocol, skin assessment and search done and no contraband found.  Pt  educated on therapeutic milieu rules. Pt was introduced to milieu by nursing staff.    R: Pt was receptive to education about the milieu .  15 min safety checks started. Probation officer offered support

## 2015-10-28 NOTE — ED Notes (Signed)
Dr. Karma Greaser at bedside for suture placement to pts anterior portion of neck. 1 continuous suture with 7 passes.

## 2015-10-28 NOTE — BH Assessment (Signed)
Assessment Note  Jesse Obrien is an 55 y.o. male presenting to the ED under IVC and accompanied by [facility].  Pt reports suicidal ideations with a plan to cut his throat.  Pt has made superfical cuts to his throat.  Pt reports feeling sad and depressed over the recent death of his sister.  He also reports feeling overwhelmed because of the health condition of member's of his wife's family.  Pt admits to drinking a case of beer a day.  Diagnosis: Alcohol Intoxication/suicidal  Past Medical History:  Past Medical History  Diagnosis Date  . Hypertension 01/02/2015  . DJD (degenerative joint disease)     History reviewed. No pertinent past surgical history.  Family History: No family history on file.  Social History:  reports that he has been smoking.  He does not have any smokeless tobacco history on file. He reports that he drinks alcohol. He reports that he does not use illicit drugs.  Additional Social History:  Alcohol / Drug Use History of alcohol / drug use?: Yes Longest period of sobriety (when/how long): unknown Negative Consequences of Use: Financial, Personal relationships Substance #1 Name of Substance 1: ETOH 1 - Age of First Use: unsure 1 - Amount (size/oz): 12 case 1 - Frequency: daily 1 - Duration: all day 1 - Last Use / Amount: today  CIWA: CIWA-Ar BP: (!) 164/95 mmHg Pulse Rate: (!) 107 COWS:    Allergies: No Known Allergies  Home Medications:  (Not in a hospital admission)  OB/GYN Status:  No LMP for male patient.  General Assessment Data Location of Assessment: [facility] ED TTS Assessment: In system Is this a Tele or Face-to-Face Assessment?: Face-to-Face Is this an Initial Assessment or a Re-assessment for this encounter?: Initial Assessment Marital status: Married [place] name: N/A Is patient pregnant?: No Pregnancy Status: No Living Arrangements: Spouse/significant other Can pt return to current living arrangement?: Yes Admission  Status: Involuntary Is patient capable of signing voluntary admission?: Yes Referral Source: Self/Family/Friend Insurance type: Tricare     Crisis Care Plan Living Arrangements: Spouse/significant other Legal Guardian: Other: (self) Name of Psychiatrist: N/A Name of Therapist: N/A  Education Status Is patient currently in school?: No Current Grade: N/A Highest grade of school patient has completed: 12 Name of school: N/A Contact person: N/A  Risk to self with the past 6 months Suicidal Ideation: Yes-Currently Present Has patient been a risk to self within the past 6 months prior to admission? : No Suicidal Intent: Yes-Currently Present Has patient had any suicidal intent within the past 6 months prior to admission? : No Is patient at risk for suicide?: Yes Suicidal Plan?: Yes-Currently Present Has patient had any suicidal plan within the past 6 months prior to admission? : No Specify Current Suicidal Plan: Pt cut himself on the throat Access to Means: Yes Specify Access to Suicidal Means: Pt has access to knives What has been your use of drugs/alcohol within the last 12 months?: EtOh 12 pack daily Previous Attempts/Gestures: Yes How many times?: 3 Other Self Harm Risks: None identified Triggers for Past Attempts: Unpredictable Intentional Self Injurious Behavior: Cutting Comment - Self Injurious Behavior: Pt has a history of making superfical cuts on his neck. Family Suicide History: No Recent stressful life event(s): Other (Comment), Loss (Comment) (Loss of sister) Persecutory voices/beliefs?: No Depression: Yes Depression Symptoms: Loss of interest in usual pleasures, Feeling worthless/self pity, Isolating Substance abuse history and/or treatment for substance abuse?: Yes Suicide prevention information given to non-admitted  patients: Not applicable  Risk to Others within the past 6 months Homicidal Ideation: No Does patient have any lifetime risk of violence toward  others beyond the six months prior to admission? : No Thoughts of Harm to Others: No Current Homicidal Intent: No Current Homicidal Plan: No Access to Homicidal Means: No Identified Victim: None identified History of harm to others?: No Assessment of Violence: None Noted Violent Behavior Description: None identified Does patient have access to weapons?: No Criminal Charges Pending?: No Does patient have a court date: No Is patient on probation?: Unknown  Psychosis Hallucinations: None noted Delusions: None noted  Mental Status Report Appearance/Hygiene: In scrubs Eye Contact: Good Motor Activity: Freedom of movement Speech: Logical/coherent Level of Consciousness: Quiet/awake Mood: Depressed Affect: Depressed Anxiety Level: Minimal Thought Processes: Relevant Judgement: Impaired Orientation: Person, Place, Situation, Time Obsessive Compulsive Thoughts/Behaviors: None  Cognitive Functioning Concentration: Normal Memory: Recent Intact, Remote Intact IQ: Average Insight: Fair Impulse Control: Fair Appetite: Fair Weight Loss: 0 Weight Gain: 0 Sleep: Unable to Assess Vegetative Symptoms: None  ADLScreening [facility] Assessment Services) Patient's cognitive ability adequate to safely complete daily activities?: Yes Patient able to express need for assistance with ADLs?: Yes Independently performs ADLs?: Yes (appropriate for developmental age)  Prior Inpatient Therapy Prior Inpatient Therapy: Yes Prior Therapy Dates: 2016 Prior Therapy Facilty/Provider(s): [facility] Reason for Treatment: depression  Prior Outpatient Therapy Prior Outpatient Therapy: No Prior Therapy Dates: N/A Prior Therapy Facilty/Provider(s): N/A Reason for Treatment: N/A Does patient have an ACCT team?: No Does patient have Intensive In-House Services?  : No Does patient have [facility] services? : No Does patient have P4CC services?: No  ADL Screening (condition at time of admission) Patient's  cognitive ability adequate to safely complete daily activities?: Yes Patient able to express need for assistance with ADLs?: Yes Independently performs ADLs?: Yes (appropriate for developmental age)       Abuse/Neglect Assessment (Assessment to be complete while patient is alone) Physical Abuse: Denies Verbal Abuse: Denies Sexual Abuse: Denies Exploitation of patient/patient's resources: Denies Self-Neglect: Denies Values / Beliefs Cultural Requests During Hospitalization: None Spiritual Requests During Hospitalization: None Consults Spiritual Care Consult Needed: No Social Work Consult Needed: No      Additional Information 1:1 In Past 12 Months?: No CIRT Risk: No Elopement Risk: No Does patient have medical clearance?: No     Disposition:  Disposition Initial Assessment Completed for this Encounter: Yes Disposition of Patient: Other dispositions Other disposition(s): Other (Comment) (Psych MD consult)  On Site Evaluation by:   Reviewed with Physician:    Oneita Hurt 10/28/2015 5:33 AM

## 2015-10-28 NOTE — ED Notes (Addendum)
Patient to ER for c/o cutting own throat. Patient has superficial cut to front of throat. Patient has IVC papers pending. Patient brought in by Officer Ileene Patrick with [facility].

## 2015-10-28 NOTE — ED Provider Notes (Signed)
[facility] Emergency Department Provider Note  ____________________________________________  Time seen: Approximately 5:10 AM  I have reviewed the triage vital signs and the nursing notes.   HISTORY  Chief Complaint Psychiatric Evaluation  Patient is acutely intoxicated and uncooperative which limits history  HPI Jesse Obrien is a 55 y.o. male with an extensive history of self-harm and alcohol dependence and intoxication.  He has had severe neck injuries in the past as a result of stabbing himself.  Tonight he is brought in by police under involuntary commitment for slicing his own throat and an apparent suicide attempt.  He reports that he is depressed, hearing voices from guide, "tired of all these politicians", his sister recently passed away, and he drink case of beer.  He is in no pain at this time and states "I am used to pain".  He denies shortness of breath, chest pain, abdominal pain, headache.  He will not admit suicidality in spite of the large wound on his neck; when I ask him why he did it, he stated "because I can."  Symptoms are severe and made worse by alcohol.   Past Medical History  Diagnosis Date  . Hypertension 01/02/2015  . DJD (degenerative joint disease)     Patient Active Problem List   Diagnosis Date Noted  . Alcohol use disorder, severe, dependence ([place]) 03/20/2015  . Alcohol withdrawal ([place]) 03/20/2015  . DDD (degenerative disc disease), lumbar 03/20/2015  . Osteoarthrosis, unspecified whether generalized or localized, involving lower leg 03/20/2015  . Spinal stenosis of lumbar region 03/20/2015  . Severe recurrent major depression without psychotic features ([place]) 03/19/2015  . Self inflicted neck laceration 01/02/2015  . Hypertension 01/02/2015    History reviewed. No pertinent past surgical history.  Current Outpatient Rx  Name  Obrien  Sig  Dispense  Refill  . cephALEXin (KEFLEX) 500 MG capsule   Oral   Take 1 capsule  (500 mg total) by mouth every 8 (eight) hours.   30 capsule   0   . citalopram (CELEXA) 20 MG tablet   Oral   Take 1 tablet (20 mg total) by mouth daily.   30 tablet   0   . diazepam (VALIUM) 5 MG tablet   Oral   Take 1 tablet (5 mg total) by mouth every 8 (eight) hours as needed for anxiety.   30 tablet   0   . gabapentin (NEURONTIN) 400 MG capsule   Oral   Take 1 capsule (400 mg total) by mouth 3 (three) times daily.   90 capsule   0   . lidocaine (LIDODERM) 5 %   Transdermal   Place 1 patch onto the skin daily. Remove & Discard patch within 12 hours or as directed by MD   30 patch   0   . lidocaine (LIDODERM) 5 %   Transdermal   Place 1 patch onto the skin every 12 (twelve) hours. Remove & Discard patch within 12 hours or as directed by MD   10 patch   0   . meloxicam (MOBIC) 15 MG tablet   Oral   Take 1 tablet (15 mg total) by mouth daily.   30 tablet   0   . mupirocin ointment (BACTROBAN) 2 %   Nasal   Place into the nose 2 (two) times daily.   22 g   0   . oxyCODONE-acetaminophen (ROXICET) 5-325 MG tablet   Oral   Take 1 tablet by mouth every  6 (six) hours as needed.   12 tablet   0   . pantoprazole (PROTONIX) 40 MG tablet   Oral   Take 1 tablet (40 mg total) by mouth daily.   30 tablet   0   . traZODone (DESYREL) 150 MG tablet   Oral   Take 1 tablet (150 mg total) by mouth at bedtime.   30 tablet   0     Allergies Review of patient's allergies indicates no known allergies.  No family history on file.  Social History Social History  Substance Use Topics  . Smoking status: Current Every Day Smoker -- 15 years  . Smokeless tobacco: None  . Alcohol Use: Yes    Review of Systems Constitutional: No fever/chills Eyes: No visual changes. ENT: No sore throat. Cardiovascular: Denies chest pain. Respiratory: Denies shortness of breath. Gastrointestinal: No abdominal pain.  No nausea, no vomiting.  No diarrhea.  No  constipation. Genitourinary: Negative for dysuria. Musculoskeletal: Negative for back pain. Skin: Laceration to anterior neck, self-inflicted Neurological: Negative for headaches, focal weakness or numbness. Psych:  Unclear suicidality, hearing "voices from God"  10-point ROS otherwise negative.  ____________________________________________   PHYSICAL EXAM:  VITAL SIGNS: ED Triage Vitals  Enc Vitals Group     BP 10/28/15 0429 164/95 mmHg     Pulse Rate 10/28/15 0429 107     Resp 10/28/15 0429 18     Temp 10/28/15 0429 98.2 F (36.8 C)     Temp Source 10/28/15 0429 Oral     SpO2 10/28/15 0429 94 %     Weight 10/28/15 0429 210 lb (95.255 kg)     Height 10/28/15 0429 5\' 10"  (1.778 m)     Head Cir --      Peak Flow --      Pain Score --      Pain Loc --      Pain Edu? --      Excl. in [place]? --     Constitutional: Alert and oriented. Well appearing and in no acute distress. Eyes: Conjunctivae are normal. PERRL. EOMI. Head: Atraumatic. Nose: No congestion/rhinnorhea. Mouth/Throat: Mucous membranes are moist.  Oropharynx non-erythematous. Neck: No stridor.  No meningeal signs.  Large 4.7 cm horizontal laceration across lower anterior neck with subcutaneous tissue exposed but no significant bleeding and no airway compromise Cardiovascular: Normal rate, regular rhythm. Good peripheral circulation. Grossly normal heart sounds.   Respiratory: Normal respiratory effort.  No retractions. Lungs CTAB. Gastrointestinal: Soft and nontender. No distention.  Musculoskeletal: No lower extremity tenderness nor edema. No gross deformities of extremities. Neurologic:  Normal speech and language. No gross focal neurologic deficits are appreciated.  Skin:  Skin is warm, dry and intact. No rash noted. Psychiatric: Patient is clearly intoxicated, talking about occasionally hearing voices from God, and injured himself  ____________________________________________   LABS (all labs ordered are  listed, but only abnormal results are displayed)  Labs Reviewed  COMPREHENSIVE METABOLIC PANEL - Abnormal; Notable for the following:    Sodium 134 (*)    Potassium 3.4 (*)    Glucose, Bld 118 (*)    Calcium 8.8 (*)    AST 51 (*)    All other components within normal limits  ETHANOL - Abnormal; Notable for the following:    Alcohol, Ethyl (B) 278 (*)    All other components within normal limits  ACETAMINOPHEN LEVEL - Abnormal; Notable for the following:    Acetaminophen (Tylenol), Serum <10 (*)    All  other components within normal limits  CBC - Abnormal; Notable for the following:    WBC 11.2 (*)    All other components within normal limits  SALICYLATE LEVEL  URINE DRUG SCREEN, QUALITATIVE ([facility] ONLY)   ____________________________________________  EKG  None ____________________________________________  RADIOLOGY   No results found.  ____________________________________________   PROCEDURES  Procedure(s) performed: laceration repair, see procedure note(s).  LACERATION REPAIR Performed by: Hinda Kehr Authorized by: Hinda Kehr Consent: Verbal consent obtained. Risks and benefits: risks, benefits and alternatives were discussed Consent given by: patient Patient identity confirmed: provided demographic data Prepped and Draped in normal sterile fashion Wound explored  Laceration Location: anterior neck  Laceration Length: 4.7 cm  No Foreign Bodies seen or palpated  Anesthesia: local infiltration  Local anesthetic: lidocaine 1% without epinephrine  Anesthetic total: 5 ml  Irrigation method: syringe Amount of cleaning: standard  Skin closure: Ethilon 4-0  Number of sutures: continuous running suture with 7 passes  Technique: continuous running  Patient tolerance: Patient tolerated the procedure well with no immediate complications.   Critical Care performed: No ____________________________________________   INITIAL IMPRESSION / ASSESSMENT  AND PLAN / ED COURSE  Pertinent labs & imaging results that were available during my care of the patient were reviewed by me and considered in my medical decision making (see chart for details).  Significant lac to neck, but exploration reveals injury only extends subcutaneously and does not involve any vital structures.  Repaired after extensive irrigation as described in procedure note above.  Patient is intoxicated and in no acute distress.  IVC, pending psych eval. No indication for antibiotics.  Tetanus up to date.    CIWA protocol initiated.  ____________________________________________  FINAL CLINICAL IMPRESSION(S) / ED DIAGNOSES  Final diagnoses:  Substance induced mood disorder (HCC)  Alcohol use disorder, severe, dependence ([place])  Alcohol intoxication, with unspecified complication ([place])  Self-harm, initial encounter  Laceration  Involuntary commitment      NEW MEDICATIONS STARTED DURING THIS VISIT:  New Prescriptions   No medications on file      Note:  This document was prepared using Dragon voice recognition software and may include unintentional dictation errors.   Hinda Kehr, MD 10/28/15 [id number]

## 2015-10-28 NOTE — ED Notes (Signed)
Pt states he cut his neck and called police. When asked what he used, Pt states "I know how this goes, I have nothing else to say." When asked why pt was brought in, pt states, "Tired of all these politicians, they ain't seen the stuff Ive been through. My sister passed away, and I was smoking cigarettes and drinking beer." When asked if pt hears voices, pt states, "Sometimes I do, Voices from God." Pt reports ETOH, 1 case of beer over the last 24 hours.

## 2015-10-28 NOTE — Consult Note (Signed)
Jesse Obrien Face-to-Face Psychiatry Consult   Reason for Consult:  Consult for 55 year old man with history of alcohol abuse and depression who comes in after cutting himself on the throat again. Referring Physician:  Cinda Obrien Patient Identification: TIN ENGRAM MRN:  [id number] Principal Diagnosis: Substance induced mood disorder [facility]rien) Diagnosis:   Patient Active Problem List   Diagnosis Date Noted  . Substance induced mood disorder ([place]) [F19.94] 10/28/2015  . Suicidal behavior [F48.9] 10/28/2015  . Alcohol use disorder, severe, dependence ([place]) [F10.20] 03/20/2015  . Alcohol withdrawal ([place]) [F10.239] 03/20/2015  . DDD (degenerative disc disease), lumbar [M51.36] 03/20/2015  . Osteoarthrosis, unspecified whether generalized or localized, involving lower leg [M17.9] 03/20/2015  . Spinal stenosis of lumbar region [M48.06] 03/20/2015  . Severe recurrent major depression without psychotic features (Jesse Obrien) [F33.2] 03/19/2015  . Self inflicted neck laceration [T14.8] 01/02/2015  . Hypertension [I10] 01/02/2015    Total Time spent with patient: 1 hour  Subjective:   OTHON Obrien is a 55 y.o. male patient admitted with "I cut myself".  HPI:  Patient interviewed. Chart reviewed including old psychiatric notes. Labs and vitals reviewed. This 55 year old man came into the hospital apparently after calling an ambulance himself. While intoxicated last night he cut himself on the neck. Although the cut was described as superficial it did require sutures. Jesse Obrien says that he consumed about a case of beer yesterday. He admits that he's been drinking at least once a week and when he does drink it's usually binge drinking. He's been very sad and depressed recently even when not intoxicated. Recent stress was the death of his sister who died just a week ago. Sleep is somewhat erratic. Appetite okay. Denies any hallucinations or psychotic symptoms. Denies that he is using any other drugs currently. Patient is not  going to any outpatient mental health or substance abuse treatment. Gets little social support or activity.  Social history: Lives with his wife. He is married. Retired. Little activity outside the home. Sister died of cancer last week.  Medical history: Patient has high blood pressure for which she doesn't take his medicine. He's been prescribed antidepressants which she doesn't take. He has chronic back pain and isn't taking any medicine or doing anything for right now.  Substance abuse history: Long-standing history of alcohol abuse. No known history of seizures or delirium tremens. Tends not to abuse other drugs. Has tried to kill himself by cutting his throat multiple times in the past while drunk.  Past Psychiatric History: Patient has a history of depression as well as substance-induced mood problems. Tends not to follow-up with outpatient care. Unclear if antidepressants of been of any benefit. Several prior suicide attempt several prior hospitalizations. Last antidepressant citalopram.  Risk to Self: Suicidal Ideation: Yes-Currently Present Suicidal Intent: Yes-Currently Present Is patient at risk for suicide?: Yes Suicidal Plan?: Yes-Currently Present Specify Current Suicidal Plan: Pt cut himself on the throat Access to Means: Yes Specify Access to Suicidal Means: Pt has access to knives What has been your use of drugs/alcohol within the last 12 months?: EtOh 12 pack daily How many times?: 3 Other Self Harm Risks: None identified Triggers for Past Attempts: Unpredictable Intentional Self Injurious Behavior: Cutting Comment - Self Injurious Behavior: Pt has a history of making superfical cuts on his neck. Risk to Others: Homicidal Ideation: No Thoughts of Harm to Others: No Current Homicidal Intent: No Current Homicidal Plan: No Access to Homicidal Means: No Identified Victim: None identified History of harm to others?: No Assessment  of Violence: None Noted Violent Behavior  Description: None identified Does patient have access to weapons?: No Criminal Charges Pending?: No Does patient have a court date: No Prior Inpatient Therapy: Prior Inpatient Therapy: Yes Prior Therapy Dates: 2016 Prior Therapy Facilty/Provider(s): [facility] Reason for Treatment: depression Prior Outpatient Therapy: Prior Outpatient Therapy: No Prior Therapy Dates: N/A Prior Therapy Facilty/Provider(s): N/A Reason for Treatment: N/A Does patient have an ACCT team?: No Does patient have Intensive In-House Services?  : No Does patient have [facility] services? : No Does patient have P4CC services?: No  Past Medical History:  Past Medical History  Diagnosis Date  . Hypertension 01/02/2015  . DJD (degenerative joint disease)    History reviewed. No pertinent past surgical history. Family History: No family history on file. Family Psychiatric  History: Patient says there is a family history of alcohol abuse in both of his parents but no other family history of mental health problems. Social History:  History  Alcohol Use  . Yes     History  Drug Use No    Social History   Social History  . Marital Status: Married    Spouse Name: N/A  . Number of Children: N/A  . Years of Education: N/A   Social History Main Topics  . Smoking status: Current Every Day Smoker -- 15 years  . Smokeless tobacco: None  . Alcohol Use: Yes  . Drug Use: No  . Sexual Activity: Yes    Birth Control/ Protection: Condom   Other Topics Concern  . None   Social History Narrative   Additional Social History:    Allergies:  No Known Allergies  Labs:  Results for orders placed or performed during the hospital encounter of 10/28/15 (from the past 48 hour(s))  Urine Drug Screen, Qualitative ([facility] only)     Status: None   Collection Time: 10/28/15  4:34 AM  Result Value Ref Range   Tricyclic, Ur Screen NONE DETECTED NONE DETECTED   Amphetamines, Ur Screen NONE DETECTED NONE DETECTED   MDMA (Ecstasy)Ur  Screen NONE DETECTED NONE DETECTED   Cocaine Metabolite,Ur [place] NONE DETECTED NONE DETECTED   Opiate, Ur Screen NONE DETECTED NONE DETECTED   Phencyclidine (PCP) Ur S NONE DETECTED NONE DETECTED   Cannabinoid 50 Ng, Ur  NONE DETECTED NONE DETECTED   Barbiturates, Ur Screen NONE DETECTED NONE DETECTED   Benzodiazepine, Ur Scrn NONE DETECTED NONE DETECTED   Methadone Scn, Ur NONE DETECTED NONE DETECTED    Comment: (NOTE) 419  Tricyclics, urine               Cutoff 1000 ng/mL 200  Amphetamines, urine             Cutoff 1000 ng/mL 300  MDMA (Ecstasy), urine           Cutoff 500 ng/mL 400  Cocaine Metabolite, urine       Cutoff 300 ng/mL 500  Opiate, urine                   Cutoff 300 ng/mL 600  Phencyclidine (PCP), urine      Cutoff 25 ng/mL 700  Cannabinoid, urine              Cutoff 50 ng/mL 800  Barbiturates, urine             Cutoff 200 ng/mL 900  Benzodiazepine, urine           Cutoff 200 ng/mL 1000 Methadone, urine  Cutoff 300 ng/mL 1100 1200 The urine drug screen provides only a preliminary, unconfirmed 1300 analytical test result and should not be used for non-medical 1400 purposes. Clinical consideration and professional judgment should 1500 be applied to any positive drug screen result due to possible 1600 interfering substances. A more specific alternate chemical method 1700 must be used in order to obtain a confirmed analytical result.  1800 Gas chromato graphy / mass spectrometry (GC/MS) is the preferred 1900 confirmatory method.   Comprehensive metabolic panel     Status: Abnormal   Collection Time: 10/28/15  4:35 AM  Result Value Ref Range   Sodium 134 (L) 135 - 145 mmol/L   Potassium 3.4 (L) 3.5 - 5.1 mmol/L   Chloride 102 101 - 111 mmol/L   CO2 24 22 - 32 mmol/L   Glucose, Bld 118 (H) 65 - 99 mg/dL   BUN 7 6 - 20 mg/dL   Creatinine, Ser 0.85 0.61 - 1.24 mg/dL   Calcium 8.8 (L) 8.9 - 10.3 mg/dL   Total Protein 8.1 6.5 - 8.1 g/dL   Albumin 4.6 3.5 -  5.0 g/dL   AST 51 (H) 15 - 41 U/L   ALT 54 17 - 63 U/L   Alkaline Phosphatase 71 38 - 126 U/L   Total Bilirubin 0.4 0.3 - 1.2 mg/dL   GFR calc non Af Amer >60 >60 mL/min   GFR calc Af Amer >60 >60 mL/min    Comment: (NOTE) The eGFR has been calculated using the CKD EPI equation. This calculation has not been validated in all clinical situations. eGFR's persistently <60 mL/min signify possible Chronic Kidney Disease.    Anion gap 8 5 - 15  Ethanol (ETOH)     Status: Abnormal   Collection Time: 10/28/15  4:35 AM  Result Value Ref Range   Alcohol, Ethyl (B) 278 (H) <5 mg/dL    Comment:        LOWEST DETECTABLE LIMIT FOR SERUM ALCOHOL IS 5 mg/dL FOR MEDICAL PURPOSES ONLY   Salicylate level     Status: None   Collection Time: 10/28/15  4:35 AM  Result Value Ref Range   Salicylate Lvl <6.2 2.8 - 30.0 mg/dL  Acetaminophen level     Status: Abnormal   Collection Time: 10/28/15  4:35 AM  Result Value Ref Range   Acetaminophen (Tylenol), Serum <10 (L) 10 - 30 ug/mL    Comment:        THERAPEUTIC CONCENTRATIONS VARY SIGNIFICANTLY. A RANGE OF 10-30 ug/mL MAY BE AN EFFECTIVE CONCENTRATION FOR MANY PATIENTS. HOWEVER, SOME ARE BEST TREATED AT CONCENTRATIONS OUTSIDE THIS RANGE. ACETAMINOPHEN CONCENTRATIONS >150 ug/mL AT 4 HOURS AFTER INGESTION AND >50 ug/mL AT 12 HOURS AFTER INGESTION ARE OFTEN ASSOCIATED WITH TOXIC REACTIONS.   CBC     Status: Abnormal   Collection Time: 10/28/15  4:35 AM  Result Value Ref Range   WBC 11.2 (H) 3.8 - 10.6 K/uL   RBC 5.02 4.40 - 5.90 MIL/uL   Hemoglobin 16.8 13.0 - 18.0 g/dL   HCT 48.8 40.0 - 52.0 %   MCV 97.1 80.0 - 100.0 fL   MCH 33.6 26.0 - 34.0 pg   MCHC 34.6 32.0 - 36.0 g/dL   RDW 13.0 11.5 - 14.5 %   Platelets 178 150 - 440 K/uL    Current Facility-Administered Medications  Medication Dose Route Frequency Provider Last Rate Last Dose  . citalopram (CELEXA) tablet 20 mg  20 mg Oral Daily Gonzella Lex, MD      .  gabapentin  (NEURONTIN) capsule 400 mg  400 mg Oral TID Gonzella Lex, MD      . LORazepam (ATIVAN) tablet 0-4 mg  0-4 mg Oral Q6H Hinda Kehr, MD   0 mg at 10/28/15 9379   Followed by  . [START ON 10/30/2015] LORazepam (ATIVAN) tablet 0-4 mg  0-4 mg Oral Q12H Hinda Kehr, MD      . pantoprazole (PROTONIX) EC tablet 40 mg  40 mg Oral Daily Gonzella Lex, MD       Current Outpatient Prescriptions  Medication Sig Dispense Refill  . cephALEXin (KEFLEX) 500 MG capsule Take 1 capsule (500 mg total) by mouth every 8 (eight) hours. 30 capsule 0  . citalopram (CELEXA) 20 MG tablet Take 1 tablet (20 mg total) by mouth daily. 30 tablet 0  . diazepam (VALIUM) 5 MG tablet Take 1 tablet (5 mg total) by mouth every 8 (eight) hours as needed for anxiety. 30 tablet 0  . gabapentin (NEURONTIN) 400 MG capsule Take 1 capsule (400 mg total) by mouth 3 (three) times daily. 90 capsule 0  . lidocaine (LIDODERM) 5 % Place 1 patch onto the skin daily. Remove & Discard patch within 12 hours or as directed by MD 30 patch 0  . lidocaine (LIDODERM) 5 % Place 1 patch onto the skin every 12 (twelve) hours. Remove & Discard patch within 12 hours or as directed by MD 10 patch 0  . meloxicam (MOBIC) 15 MG tablet Take 1 tablet (15 mg total) by mouth daily. 30 tablet 0  . mupirocin ointment (BACTROBAN) 2 % Place into the nose 2 (two) times daily. 22 g 0  . oxyCODONE-acetaminophen (ROXICET) 5-325 MG tablet Take 1 tablet by mouth every 6 (six) hours as needed. 12 tablet 0  . pantoprazole (PROTONIX) 40 MG tablet Take 1 tablet (40 mg total) by mouth daily. 30 tablet 0  . traZODone (DESYREL) 150 MG tablet Take 1 tablet (150 mg total) by mouth at bedtime. 30 tablet 0    Musculoskeletal: Strength & Muscle Tone: decreased Gait & Station: ataxic Patient leans: N/A  Psychiatric Specialty Exam: Review of Systems  Constitutional: Negative.   Eyes: Negative.   Respiratory: Negative.   Cardiovascular: Negative.   Gastrointestinal: Negative.    Musculoskeletal: Positive for back pain and neck pain.  Skin: Negative.   Neurological: Negative.   Psychiatric/Behavioral: Positive for depression, suicidal ideas and substance abuse. Negative for hallucinations and memory loss. The patient is nervous/anxious and has insomnia.     Blood pressure 130/95, pulse 97, temperature 97.6 F (36.4 C), temperature source Oral, resp. rate 18, height '5\' 10"'  (1.778 m), weight 95.255 kg (210 lb), SpO2 97 %.Body mass index is 30.13 kg/(m^2).  General Appearance: Disheveled  Eye Contact::  Minimal  Speech:  Slow and Slurred  Volume:  Decreased  Mood:  Depressed  Affect:  Depressed and Flat  Thought Process:  Goal Directed  Orientation:  Full (Time, Place, and Person)  Thought Content:  Negative  Suicidal Thoughts:  Yes.  with intent/plan  Homicidal Thoughts:  No  Memory:  Immediate;   Good Recent;   Fair Remote;   Fair  Judgement:  Impaired  Insight:  Present  Psychomotor Activity:  Decreased  Concentration:  Fair  Recall:  [employer] of Knowledge:Fair  Language: Fair  Akathisia:  No  Handed:  Right  AIMS (if indicated):     Assets:  Financial Resources/Insurance Housing Social Support  ADL's:  Intact  Cognition: WNL  Sleep:      Treatment Plan Summary: Daily contact with patient to assess and evaluate symptoms and progress in treatment, Medication management and Plan 55 year old man came into the hospital after cutting himself on the neck. Intoxicated at the time. Current symptoms of major depression. Not getting any outpatient treatment. Not getting any substance abuse treatment. Recurrent suicide attempts in the past. Affect is still dysphoric he is minimally responsive seems very down and hopeless. Patient will be admitted to the psychiatry ward. Continuous observation. Continue when necessary Ativan as needed for withdrawal although typically he is able to detox easily. Restart citalopram restart pantoprazole restart Neurontin for  pain. Try and engage him in groups and activities on the unit and monitor improvement in the mood.  Disposition: Recommend psychiatric Inpatient admission when medically cleared. Supportive therapy provided about ongoing stressors.  Alethia Berthold, MD 10/28/2015 1:47 PM

## 2015-10-28 NOTE — Tx Team (Signed)
Initial Interdisciplinary Treatment Plan   PATIENT STRESSORS: Health problems Loss of sister Substance abuse   PATIENT STRENGTHS: Average or above average intelligence Communication skills   PROBLEM LIST: Problem List/Patient Goals Date to be addressed Date deferred Reason deferred Estimated date of resolution  Major Depressive Disorder 10/28/15     Suicidal behavior 10/28/15                                                DISCHARGE CRITERIA:  Improved stabilization in mood, thinking, and/or behavior  PRELIMINARY DISCHARGE PLAN: Outpatient therapy  PATIENT/FAMIILY INVOLVEMENT: This treatment plan has been presented to and reviewed with the patient, Jesse Obrien, and/or family member, .  The patient and family have been given the opportunity to ask questions and make suggestions.  [name] 10/28/2015, 6:40 PM

## 2015-10-28 NOTE — ED Notes (Signed)
Pts neck wound cleaned with sterile saline and betadine.

## 2015-10-29 DIAGNOSIS — F333 Major depressive disorder, recurrent, severe with psychotic symptoms: Principal | ICD-10-CM

## 2015-10-29 NOTE — Progress Notes (Signed)
Recreation Therapy Notes  Date: 04.27.17 Time: 1:00 pm Location: Craft Room  Group Topic: Leisure Education  Goal Area(s) Addresses:  Patient will identify things they are grateful for. Patient will identify how being grateful can influence decision making.  Behavioral Response: Attentive, Interactive  Intervention: Grateful Wheel  Activity: Patients were given a I Am Grateful For worksheet and instructed to write at least one thing they are grateful for under each category.  Education: LRT educated patients on why it is important to be aware of things we are grateful for.  Education Outcome: Acknowledges education/In group clarification offered  Clinical Observations/Feedback: Patient completed activity by writing at least one item for most of the categories. Patient contributed to group discussion by stating some things he is grateful for.  Leonette Monarch, LRT/CTRS 10/29/2015 2:27 PM

## 2015-10-29 NOTE — H&P (Signed)
Psychiatric Admission Assessment Adult  Patient Identification: Jesse Obrien MRN:  [id number] Date of Evaluation:  10/29/2015 Chief Complaint:  depression Principal Diagnosis: Major depressive disorder, recurrent, severe with psychotic features ([place]) Diagnosis:   Patient Active Problem List   Diagnosis Date Noted  . Suicidal behavior [F48.9] 10/28/2015  . Noncompliance [Z91.19] 10/28/2015  . Tobacco use disorder [F17.200] 10/28/2015  . Substance induced mood disorder ([place]) [F19.94] 10/28/2015  . GERD (gastroesophageal reflux disease) [K21.9] 10/28/2015  . Alcohol use disorder, severe, dependence ([place]) [F10.20] 03/20/2015  . DDD (degenerative disc disease), lumbar [M51.36] 03/20/2015  . Osteoarthrosis, unspecified whether generalized or localized, involving lower leg [M17.9] 03/20/2015  . Spinal stenosis of lumbar region [M48.06] 03/20/2015  . Major depressive disorder, recurrent, severe with psychotic features ([place]) [F33.3] 03/19/2015  . Self inflicted neck laceration [T14.8] 01/02/2015  . Hypertension [I10] 01/02/2015   History of Present Illness:  Identifying data. Mr. Jesse Obrien is a 55 year old male with history of alcoholism.  Chief complaint. "I got drunk."  History of present illness. Information was obtained from the patient and the chart. Mr. Jesse Obrien is well known to [place] as he has been admitted numerous times to our hospital for alcohol detox. He has been drinking more heavily for the past week, since his siter passed away. On the day of admission he had a case of beer. While drunk, the patient cut his neck again requiring stitches. He has been coming to the emergency room recently complaining of back pain. The patient denies any symptoms of depression, anxiety or psychosis. He denies other than alcohol substance use. The patient has a history of chronic back pain for which in the past he was prescribed narcotic painkillers.   Past psychiatric history. There were multiple  hospitalizations at [facility] as well as other hospitals for alcoholism. The patient has not been compliant with treatment. He has not been able to maintain sobriety but considers himself an episodic drinker. He had a several suicide attempts by overdose and cutting, always while drunk. He has been numerous times in alcohol rehabilitation at the [place].  Family psychiatric history. Multiple family members with alcoholism.  Past medical history. Chronic back pain, history of hypertension.  Social history. He is a retired Scientist, research (life sciences). He does not work with the [place] system. He is married and lives with his wife.  Total Time spent with patient: 1 hour  Past Psychiatric History: depression, alcoholism.  Is the patient at risk to self? Yes.    Has the patient been a risk to self in the past 6 months? Yes.    Has the patient been a risk to self within the distant past? No.  Is the patient a risk to others? No.  Has the patient been a risk to others in the past 6 months? No.  Has the patient been a risk to others within the distant past? No.   Prior Inpatient Therapy:   Prior Outpatient Therapy:    Alcohol Screening: 1. How often do you have a drink containing alcohol?: 2 to 3 times a week 2. How many drinks containing alcohol do you have on a typical day when you are drinking?: 10 or more 3. How often do you have six or more drinks on one occasion?: Less than monthly Preliminary Score: 5 4. How often during the last year have you found that you were not able to stop drinking once you had started?: Less than monthly 5. How often during the last year have  you failed to do what was normally expected from you becasue of drinking?: Less than monthly 6. How often during the last year have you needed a first drink in the morning to get yourself going after a heavy drinking session?: Never 7. How often during the last year have you had a feeling of guilt of remorse after drinking?:  Less than monthly 8. How often during the last year have you been unable to remember what happened the night before because you had been drinking?: Less than monthly 9. Have you or someone else been injured as a result of your drinking?: Yes, but not in the last year 10. Has a relative or friend or a doctor or another health worker been concerned about your drinking or suggested you cut down?: Yes, but not in the last year Alcohol Use Disorder Identification Test Final Score (AUDIT): 16 Brief Intervention: Yes Substance Abuse History in the last 12 months:  Yes.   Consequences of Substance Abuse: Negative Previous Psychotropic Medications: Yes  Psychological Evaluations: No  Past Medical History:  Past Medical History  Diagnosis Date  . Hypertension 01/02/2015  . DJD (degenerative joint disease)    History reviewed. No pertinent past surgical history. Family History: History reviewed. No pertinent family history. Family Psychiatric  History: alcoholism. Tobacco Screening: _0 ([id number])::1)@ Social History:  History  Alcohol Use  . Yes     History  Drug Use No    Additional Social History:                           Allergies:  No Known Allergies Lab Results:  Results for orders placed or performed during the hospital encounter of 10/28/15 (from the past 48 hour(s))  Urine Drug Screen, Qualitative ([place] only)     Status: None   Collection Time: 10/28/15  4:34 AM  Result Value Ref Range   Tricyclic, Ur Screen NONE DETECTED NONE DETECTED   Amphetamines, Ur Screen NONE DETECTED NONE DETECTED   MDMA (Ecstasy)Ur Screen NONE DETECTED NONE DETECTED   Cocaine Metabolite,Ur [place] NONE DETECTED NONE DETECTED   Opiate, Ur Screen NONE DETECTED NONE DETECTED   Phencyclidine (PCP) Ur S NONE DETECTED NONE DETECTED   Cannabinoid 50 Ng, Ur [place] NONE DETECTED NONE DETECTED   Barbiturates, Ur Screen NONE DETECTED NONE DETECTED   Benzodiazepine, Ur Scrn NONE DETECTED NONE DETECTED    Methadone Scn, Ur NONE DETECTED NONE DETECTED    Comment: (NOTE) 761  Tricyclics, urine               Cutoff 1000 ng/mL 200  Amphetamines, urine             Cutoff 1000 ng/mL 300  MDMA (Ecstasy), urine           Cutoff 500 ng/mL 400  Cocaine Metabolite, urine       Cutoff 300 ng/mL 500  Opiate, urine                   Cutoff 300 ng/mL 600  Phencyclidine (PCP), urine      Cutoff 25 ng/mL 700  Cannabinoid, urine              Cutoff 50 ng/mL 800  Barbiturates, urine             Cutoff 200 ng/mL 900  Benzodiazepine, urine           Cutoff 200 ng/mL 1000 Methadone, urine  Cutoff 300 ng/mL 1100 1200 The urine drug screen provides only a preliminary, unconfirmed 1300 analytical test result and should not be used for non-medical 1400 purposes. Clinical consideration and professional judgment should 1500 be applied to any positive drug screen result due to possible 1600 interfering substances. A more specific alternate chemical method 1700 must be used in order to obtain a confirmed analytical result.  1800 Gas chromato graphy / mass spectrometry (GC/MS) is the preferred 1900 confirmatory method.   Comprehensive metabolic panel     Status: Abnormal   Collection Time: 10/28/15  4:35 AM  Result Value Ref Range   Sodium 134 (L) 135 - 145 mmol/L   Potassium 3.4 (L) 3.5 - 5.1 mmol/L   Chloride 102 101 - 111 mmol/L   CO2 24 22 - 32 mmol/L   Glucose, Bld 118 (H) 65 - 99 mg/dL   BUN 7 6 - 20 mg/dL   Creatinine, Ser 0.85 0.61 - 1.24 mg/dL   Calcium 8.8 (L) 8.9 - 10.3 mg/dL   Total Protein 8.1 6.5 - 8.1 g/dL   Albumin 4.6 3.5 - 5.0 g/dL   AST 51 (H) 15 - 41 U/L   ALT 54 17 - 63 U/L   Alkaline Phosphatase 71 38 - 126 U/L   Total Bilirubin 0.4 0.3 - 1.2 mg/dL   GFR calc non Af Amer >60 >60 mL/min   GFR calc Af Amer >60 >60 mL/min    Comment: (NOTE) The eGFR has been calculated using the CKD EPI equation. This calculation has not been validated in all clinical situations. eGFR's  persistently <60 mL/min signify possible Chronic Kidney Disease.    Anion gap 8 5 - 15  Ethanol (ETOH)     Status: Abnormal   Collection Time: 10/28/15  4:35 AM  Result Value Ref Range   Alcohol, Ethyl (B) 278 (H) <5 mg/dL    Comment:        LOWEST DETECTABLE LIMIT FOR SERUM ALCOHOL IS 5 mg/dL FOR MEDICAL PURPOSES ONLY   Salicylate level     Status: None   Collection Time: 10/28/15  4:35 AM  Result Value Ref Range   Salicylate Lvl <6.8 2.8 - 30.0 mg/dL  Acetaminophen level     Status: Abnormal   Collection Time: 10/28/15  4:35 AM  Result Value Ref Range   Acetaminophen (Tylenol), Serum <10 (L) 10 - 30 ug/mL    Comment:        THERAPEUTIC CONCENTRATIONS VARY SIGNIFICANTLY. A RANGE OF 10-30 ug/mL MAY BE AN EFFECTIVE CONCENTRATION FOR MANY PATIENTS. HOWEVER, SOME ARE BEST TREATED AT CONCENTRATIONS OUTSIDE THIS RANGE. ACETAMINOPHEN CONCENTRATIONS >150 ug/mL AT 4 HOURS AFTER INGESTION AND >50 ug/mL AT 12 HOURS AFTER INGESTION ARE OFTEN ASSOCIATED WITH TOXIC REACTIONS.   CBC     Status: Abnormal   Collection Time: 10/28/15  4:35 AM  Result Value Ref Range   WBC 11.2 (H) 3.8 - 10.6 K/uL   RBC 5.02 4.40 - 5.90 MIL/uL   Hemoglobin 16.8 13.0 - 18.0 g/dL   HCT 48.8 40.0 - 52.0 %   MCV 97.1 80.0 - 100.0 fL   MCH 33.6 26.0 - 34.0 pg   MCHC 34.6 32.0 - 36.0 g/dL   RDW 13.0 11.5 - 14.5 %   Platelets 178 150 - 440 K/uL    Blood Alcohol level:  Lab Results  Component Value Date   ETH 278* 10/28/2015   ETH 256* 09/22/2246    Metabolic Disorder Labs:  No results found for:  HGBA1C, MPG No results found for: PROLACTIN Lab Results  Component Value Date   TRIG 288* 12/28/2013    Current Medications: Current Facility-Administered Medications  Medication Dose Route Frequency Provider Last Rate Last Dose  . acetaminophen (TYLENOL) tablet 650 mg  650 mg Oral Q6H PRN Gonzella Lex, MD   650 mg at 10/28/15 2218  . alum & mag hydroxide-simeth (MAALOX/MYLANTA) 200-200-20 MG/5ML  suspension 30 mL  30 mL Oral Q4H PRN Gonzella Lex, MD      . chlordiazePOXIDE (LIBRIUM) capsule 25 mg  25 mg Oral QID Clovis Fredrickson, MD   25 mg at 10/28/15 2216  . citalopram (CELEXA) tablet 20 mg  20 mg Oral Daily Gonzella Lex, MD      . gabapentin (NEURONTIN) capsule 400 mg  400 mg Oral TID Gonzella Lex, MD   400 mg at 10/28/15 2215  . LORazepam (ATIVAN) tablet 0-4 mg  0-4 mg Oral Q6H Gonzella Lex, MD   0 mg at 10/29/15 0000   Followed by  . [START ON 10/30/2015] LORazepam (ATIVAN) tablet 0-4 mg  0-4 mg Oral Q12H John T Clapacs, MD      . magnesium hydroxide (MILK OF MAGNESIA) suspension 30 mL  30 mL Oral Daily PRN Gonzella Lex, MD      . pantoprazole (PROTONIX) EC tablet 40 mg  40 mg Oral Daily Gonzella Lex, MD      . traZODone (DESYREL) tablet 100 mg  100 mg Oral QHS Clovis Fredrickson, MD   100 mg at 10/28/15 2216   PTA Medications: Prescriptions prior to admission  Medication Sig Dispense Refill Last Dose  . cephALEXin (KEFLEX) 500 MG capsule Take 1 capsule (500 mg total) by mouth every 8 (eight) hours. 30 capsule 0   . citalopram (CELEXA) 20 MG tablet Take 1 tablet (20 mg total) by mouth daily. 30 tablet 0   . diazepam (VALIUM) 5 MG tablet Take 1 tablet (5 mg total) by mouth every 8 (eight) hours as needed for anxiety. 30 tablet 0   . gabapentin (NEURONTIN) 400 MG capsule Take 1 capsule (400 mg total) by mouth 3 (three) times daily. 90 capsule 0   . lidocaine (LIDODERM) 5 % Place 1 patch onto the skin daily. Remove & Discard patch within 12 hours or as directed by MD 30 patch 0   . lidocaine (LIDODERM) 5 % Place 1 patch onto the skin every 12 (twelve) hours. Remove & Discard patch within 12 hours or as directed by MD 10 patch 0   . meloxicam (MOBIC) 15 MG tablet Take 1 tablet (15 mg total) by mouth daily. 30 tablet 0   . mupirocin ointment (BACTROBAN) 2 % Place into the nose 2 (two) times daily. 22 g 0   . oxyCODONE-acetaminophen (ROXICET) 5-325 MG tablet Take 1  tablet by mouth every 6 (six) hours as needed. 12 tablet 0   . pantoprazole (PROTONIX) 40 MG tablet Take 1 tablet (40 mg total) by mouth daily. 30 tablet 0   . traZODone (DESYREL) 150 MG tablet Take 1 tablet (150 mg total) by mouth at bedtime. 30 tablet 0     Musculoskeletal: Strength & Muscle Tone: within normal limits Gait & Station: normal Patient leans: N/A  Psychiatric Specialty Exam: I reviewed pysicl exam performed in the emergency room and agree with the findings. Physical Exam  Nursing note and vitals reviewed.   Review of Systems  Psychiatric/Behavioral: Positive for depression, suicidal ideas, hallucinations  and substance abuse.  All other systems reviewed and are negative.   Blood pressure 127/94, pulse 93, temperature 98.4 F (36.9 C), temperature source Oral, resp. rate 20, height _0  (1.778 m), weight 90.719 kg (200 lb), SpO2 98 %.Body mass index is 28.7 kg/(m^2).  See SRA.                                                  Sleep:  Number of Hours: 9     Treatment Plan Summary: Daily contact with patient to assess and evaluate symptoms and progress in treatment and Medication management   Mr. Walle is a 55 year old male with a history of alcoholism admitted after a suicide attempt by cutting his neck while drunk.  1. Suicidal ideation. The patient is able to contract for safety.  2. Depression. He has never been compliant with medications in the community. He refuses Celexa due to sexual side effects and is not interested in another antidepressant. Will start Wellbutrin.  3. Insomnia. He is on trazodone.  4. Chronic pain. He is on Neurontin.  5. Alcohol detox. He is on Librium taper. Vital signs were stable.   7. Substance abuse treatment. He declines [facility] or any other substance abuse program participation.  8. Disposition. He will be discharged to home with his wife. He will follow up with [facility] in [facility]. We do recommend intensive  outpatient substance abuse treatment program participation   Observation Level/Precautions:  15 minute checks  Laboratory:  CBC Chemistry Profile UDS UA  Psychotherapy:    Medications:    Consultations:    Discharge Concerns:    Estimated LOS:  Other:     I certify that inpatient services furnished can reasonably be expected to improve the patient's condition.    Orson Slick, MD 4/27/20178:09 AM

## 2015-10-29 NOTE — BHH Suicide Risk Assessment (Signed)
[facility] Admission Suicide Risk Assessment   Nursing information obtained from:    Demographic factors:    Current Mental Status:    Loss Factors:    Historical Factors:    Risk Reduction Factors:     Total Time spent with patient: 1 hour Principal Problem: Major depressive disorder, recurrent, severe with psychotic features ([place]) Diagnosis:   Patient Active Problem List   Diagnosis Date Noted  . Suicidal behavior [F48.9] 10/28/2015  . Noncompliance [Z91.19] 10/28/2015  . Tobacco use disorder [F17.200] 10/28/2015  . Substance induced mood disorder ([place]) [F19.94] 10/28/2015  . GERD (gastroesophageal reflux disease) [K21.9] 10/28/2015  . Alcohol use disorder, severe, dependence ([place]) [F10.20] 03/20/2015  . DDD (degenerative disc disease), lumbar [M51.36] 03/20/2015  . Osteoarthrosis, unspecified whether generalized or localized, involving lower leg [M17.9] 03/20/2015  . Spinal stenosis of lumbar region [M48.06] 03/20/2015  . Major depressive disorder, recurrent, severe with psychotic features ([place]) [F33.3] 03/19/2015  . Self inflicted neck laceration [T14.8] 01/02/2015  . Hypertension [I10] 01/02/2015   Subjective Data: Depression, alcoholism, suicide attempt.  Continued Clinical Symptoms:  Alcohol Use Disorder Identification Test Final Score (AUDIT): 16 The "Alcohol Use Disorders Identification Test", Guidelines for Use in Primary Care, Second Edition.  World Pharmacologist [facility]). Score between 0-7:  no or low risk or alcohol related problems. Score between 8-15:  moderate risk of alcohol related problems. Score between 16-19:  high risk of alcohol related problems. Score 20 or above:  warrants further diagnostic evaluation for alcohol dependence and treatment.  CLINICAL FACTORS:   Depression:   Impulsivity Severe Alcohol/Substance Abuse/Dependencies   Musculoskeletal: Strength & Muscle Tone: within normal limits Gait & Station: normal Patient leans: N/A  Psychiatric  Specialty Exam: Review of Systems  Psychiatric/Behavioral: Positive for depression, suicidal ideas and substance abuse.  All other systems reviewed and are negative.   Blood pressure 127/94, pulse 93, temperature 98.4 F (36.9 C), temperature source Oral, resp. rate 20, height 5\' 10"  (1.778 m), weight 90.719 kg (200 lb), SpO2 98 %.Body mass index is 28.7 kg/(m^2).  General Appearance: Fairly Groomed  Engineer, water::  Good  Speech:  Clear and Coherent  Volume:  Normal  Mood:  Anxious  Affect:  Appropriate  Thought Process:  Goal Directed  Orientation:  Full (Time, Place, and Person)  Thought Content:  Hallucinations: Auditory  Suicidal Thoughts:  Yes.  with intent/plan  Homicidal Thoughts:  No  Memory:  Immediate;   Fair Recent;   Fair Remote;   Fair  Judgement:  Poor  Insight:  Shallow  Psychomotor Activity:  Normal  Concentration:  Fair  Recall:  [place]  Language: Fair  Akathisia:  No  Handed:  Right  AIMS (if indicated):     Assets:  Communication Skills Desire for Improvement Financial Resources/Insurance Housing Resilience Social Support Transportation  Sleep:  Number of Hours: 9  Cognition: WNL  ADL's:  Intact    COGNITIVE FEATURES THAT CONTRIBUTE TO RISK:  None    SUICIDE RISK:   Severe:  Frequent, intense, and enduring suicidal ideation, specific plan, no subjective intent, but some objective markers of intent (i.e., choice of lethal method), the method is accessible, some limited preparatory behavior, evidence of impaired self-control, severe dysphoria/symptomatology, multiple risk factors present, and few if any protective factors, particularly a lack of social support.  PLAN OF CARE: hospital admission, medication management, substance abuse counseling, discharge planning.  Mr. Apolonio is a 55 year old male with a history of alcoholism admitted after a  suicide attempt by cutting his neck while drunk.  1. Suicidal ideation. The patient  is able to contract for safety.  2. Depression. He has never been compliant with medications in the community. We continue Celexa.  3. Insomnia. He is on trazodone.  4. Anxiety. He is on Atarax.   5. Alcohol detox. He is on Librium taper. Vital signs were stable.   7. Substance abuse treatment. We will attempt to place in the [place] program.   8. Disposition. TBE. He will likely be discharged to home with his wife. He will follow up with [facility] in [facility]. We do recommend intensive outpatient substance abuse treatment program participation    I certify that inpatient services furnished can reasonably be expected to improve the patient's condition.   Orson Slick, MD 10/29/2015, 7:52 AM

## 2015-10-29 NOTE — BHH Group Notes (Signed)
[place] LCSW Group Therapy  10/29/2015 3:29 PM  Type of Therapy:  Group Therapy  Participation Level:  Minimal  Participation Quality:  Attentive  Affect:  Flat  Cognitive:  Alert  Insight:  Limited  Engagement in Therapy:  Limited  Modes of Intervention:  Discussion, Education, Socialization and Support  Summary of Progress/Problems: Balance in life: Patients will discuss the concept of balance and how it looks and feels to be unbalanced. Pt will identify areas in their life that is unbalanced and ways to become more balanced.  Pt attended group and stayed the entire time. Pt sat quietly and listened to other group members share.   [place] MSW, [place]  10/29/2015, 3:29 PM

## 2015-10-29 NOTE — Plan of Care (Signed)
Problem: Ineffective individual coping Goal: STG: Pt will be able to identify effective and ineffective STG: Pt will be able to identify effective and ineffective coping patterns  Outcome: Not Progressing Limited interaction with unit programing

## 2015-10-29 NOTE — Progress Notes (Signed)
Recreation Therapy Notes  At approximately 11:00 am, LRT attempted assessment. Patient refused stating, "I need to be left alone right now." LRT will attempt assessment tomorrow.  Leonette Monarch, LRT/CTRS 10/29/2015 11:41 AM

## 2015-10-29 NOTE — Progress Notes (Signed)
Calm and cooperative. Flat affect. ciwa 0. Med compliant. Isolates in room except for medications. Did not attend group. No voiced thoughts of hurting himself. tylenol 650 mg po given 2218 PRN for pain. Will continue to monitor for safety and behavior.

## 2015-10-29 NOTE — Tx Team (Signed)
Interdisciplinary Treatment Plan Update (Adult)  Date:  10/29/2015 Time Reviewed:  3:43 PM  Progress in Treatment: Attending groups: Yes. Participating in groups:  Yes. Taking medication as prescribed:  Yes. Tolerating medication:  Yes. Family/Significant othe contact made:  No, will contact:  if patient provides consent Patient understands diagnosis:  Yes. Discussing patient identified problems/goals with staff:  Yes. Medical problems stabilized or resolved:  Yes. Denies suicidal/homicidal ideation: Yes. Issues/concerns per patient self-inventory:  Yes. Other:  New problem(s) identified: No, Describe:  none reported  Discharge Plan or Barriers: Patient will stabilize and discharge home with outpatient follow up provider in [facility].   Reason for Continuation of Hospitalization: Depression Medication stabilization Suicidal ideation Withdrawal symptoms  Comments:  Estimated length of stay: up to 3 days, expected discharge Sunday 11/01/15  New goal(s):  Review of initial/current patient goals per problem list:   1.  Goal(s): Participate in aftercare plan    Met:  No  Target date: by discharge  As evidenced by: patient will participate in aftercare plan AEB aftercare provider and housing plan identified at discharge 10/29/15: Patient can discharge home once stabilized but will need to identify an outpatient provider by discharge.   2.  Goal (s): Decrease depression   Met:  No  Target date: by discharge  As evidenced by: patient demonstrates decreased symptoms of depression and reports a Depression rating of 3 or less 10/29/15: Patient states he feels slightly better and will continue to monitor for depressive symptoms. Patient is taking Celexa.   3.  Goal(s): Patient will demonstrate decreased signs of withdrawal due to substance abuse   Met:  No  Target date: by discharge  As evidenced by: Patient will produce a CIWA/COWS score of 0, have stable vitals  signs, and no symptoms of withdrawal 10/29/15: Patient is feeling better and taking librium for detox.    Attendees: Patient:  Jesse Obrien 4/27/20173:43 PM  Physician:  Orson Slick, MD 4/27/20173:43 PM  Nursing:   Nicanor Bake, RN 4/27/20173:43 PM  Other:  Carmell Austria, LCSW 4/27/20173:43 PM  Other:   4/27/20173:43 PM  Other:   4/27/20173:43 PM  Other:  4/27/20173:43 PM  Other:  4/27/20173:43 PM  Other:  4/27/20173:43 PM  Other:  4/27/20173:43 PM  Other:  4/27/20173:43 PM  Other:  4/27/20173:43 PM  Other:   4/27/20173:43 PM   Scribe for Treatment Team:   Keene Breath, MSW, LCSW  10/29/2015, 3:43 PM

## 2015-10-29 NOTE — Progress Notes (Signed)
D: Patient remains in room at present time . No group participation this shift. No interaction with his peers . affect angry  And irritable . Patient out of room for meals only. Door to room closed and dark Patient stated slept fair last night .Stated appetite is good and energy level  Is normal. Stated concentration is good . Stated on Depression scale 3 , hopeless 0 and anxiety 3 .( low 0-10 high) Denies suicidal  homicidal ideations  .  No auditory hallucinations  No pain concerns . Appropriate ADL'S. Interacting with peers and staff.  A: Encourage patient participation with unit programming . Instruction  Given on  Medication , verbalize understanding. R: Voice no other concerns. Staff continue to monitor

## 2015-10-30 NOTE — Progress Notes (Signed)
[facility] MD Progress Note  10/30/2015 3:59 PM Jesse Obrien  MRN:  [id number]  Subjective:  Jesse Obrien is a 55 year old male with a history of depression, mood instability, and episodic drinking. The patient is noncompliant with treatment in the community for his mental health, substance abuse and medical problems. He was admitted after suicide attempt by cutting his throat that required stitches while drunk. He is on Librium taper and has no symptoms of alcohol withdrawal. He wants to be discharged on Sunday.  Jesse Obrien for the first time complaints of back pain today and requests tramadol. He does not have a prescriber in the community. We will not be able to offer controlled substances. He did not remember off his back pain until today. He is on Librium taper and seems to tolerate it well. There are no symptoms of withdrawal and vital signs are stable. The patient is unconvinced that he needs any medications for depression. He declines SSRIs as they cause sexual side effects. We started Wellbutrin. The patient wants to be discharged on Sunday.  Principal Problem: Major depressive disorder, recurrent, severe with psychotic features ([place]) Diagnosis:   Patient Active Problem List   Diagnosis Date Noted  . Suicidal behavior [F48.9] 10/28/2015  . Noncompliance [Z91.19] 10/28/2015  . Tobacco use disorder [F17.200] 10/28/2015  . Substance induced mood disorder ([place]) [F19.94] 10/28/2015  . GERD (gastroesophageal reflux disease) [K21.9] 10/28/2015  . Alcohol use disorder, severe, dependence ([place]) [F10.20] 03/20/2015  . DDD (degenerative disc disease), lumbar [M51.36] 03/20/2015  . Osteoarthrosis, unspecified whether generalized or localized, involving lower leg [M17.9] 03/20/2015  . Spinal stenosis of lumbar region [M48.06] 03/20/2015  . Major depressive disorder, recurrent, severe with psychotic features ([place]) [F33.3] 03/19/2015  . Self inflicted neck laceration [T14.8] 01/02/2015  . Hypertension  [I10] 01/02/2015   Total Time spent with patient: 20 minutes  Past Psychiatric History: Depression, suicide attempts, alcohol abuse.  Past Medical History:  Past Medical History  Diagnosis Date  . Hypertension 01/02/2015  . DJD (degenerative joint disease)    History reviewed. No pertinent past surgical history. Family History: History reviewed. No pertinent family history. Family Psychiatric  History: See H&P. Social History:  History  Alcohol Use  . Yes     History  Drug Use No    Social History   Social History  . Marital Status: Married    Spouse Name: N/A  . Number of Children: N/A  . Years of Education: N/A   Social History Main Topics  . Smoking status: Current Every Day Smoker -- 15 years  . Smokeless tobacco: None  . Alcohol Use: Yes  . Drug Use: No  . Sexual Activity: Yes    Birth Control/ Protection: Condom   Other Topics Concern  . None   Social History Narrative   Additional Social History:                         Sleep: Fair  Appetite:  Fair  Current Medications: Current Facility-Administered Medications  Medication Dose Route Frequency Provider Last Rate Last Dose  . acetaminophen (TYLENOL) tablet 650 mg  650 mg Oral Q6H PRN Gonzella Lex, MD   650 mg at 10/29/15 0906  . alum & mag hydroxide-simeth (MAALOX/MYLANTA) 200-200-20 MG/5ML suspension 30 mL  30 mL Oral Q4H PRN Gonzella Lex, MD      . chlordiazePOXIDE (LIBRIUM) capsule 25 mg  25 mg Oral QID Clovis Fredrickson, MD   25  mg at 10/30/15 1316  . citalopram (CELEXA) tablet 20 mg  20 mg Oral Daily Gonzella Lex, MD   20 mg at 10/30/15 0901  . gabapentin (NEURONTIN) capsule 400 mg  400 mg Oral TID Gonzella Lex, MD   400 mg at 10/30/15 0901  . LORazepam (ATIVAN) tablet 0-4 mg  0-4 mg Oral Q12H Gonzella Lex, MD   1 mg at 10/30/15 1206  . magnesium hydroxide (MILK OF MAGNESIA) suspension 30 mL  30 mL Oral Daily PRN Gonzella Lex, MD      . pantoprazole (PROTONIX) EC tablet 40  mg  40 mg Oral Daily Gonzella Lex, MD   40 mg at 10/30/15 0901  . traZODone (DESYREL) tablet 100 mg  100 mg Oral QHS Clovis Fredrickson, MD   100 mg at 10/29/15 2206    Lab Results: No results found for this or any previous visit (from the past 48 hour(s)).  Blood Alcohol level:  Lab Results  Component Value Date   ETH 278* 10/28/2015   ETH 256* 03/19/2015    Physical Findings: AIMS:  , ,  ,  ,    CIWA:  CIWA-Ar Total: 7 COWS:     Musculoskeletal: Strength & Muscle Tone: within normal limits Gait & Station: normal Patient leans: N/A  Psychiatric Specialty Exam: Review of Systems  Musculoskeletal: Positive for back pain.  All other systems reviewed and are negative.   Blood pressure 99/67, pulse 69, temperature 97.6 F (36.4 C), temperature source Oral, resp. rate 18, height 5\' 10"  (1.778 m), weight 90.719 kg (200 lb), SpO2 96 %.Body mass index is 28.7 kg/(m^2).  General Appearance: Casual  Eye Contact::  Fair  Speech:  Clear and Coherent  Volume:  Normal  Mood:  Angry and Anxious  Affect:  Congruent  Thought Process:  Goal Directed  Orientation:  Full (Time, Place, and Person)  Thought Content:  WDL  Suicidal Thoughts:  Yes.  with intent/plan  Homicidal Thoughts:  No  Memory:  Immediate;   Fair Recent;   Fair Remote;   Fair  Judgement:  Poor  Insight:  Lacking  Psychomotor Activity:  Normal  Concentration:  Fair  Recall:  [employer] of Knowledge:Fair  Language: Fair  Akathisia:  No  Handed:  Right  AIMS (if indicated):     Assets:  Communication Skills Desire for Improvement Financial Resources/Insurance Housing Intimacy Resilience Social Support  ADL's:  Intact  Cognition: WNL  Sleep:  Number of Hours: 7.25   Treatment Plan Summary: Daily contact with patient to assess and evaluate symptoms and progress in treatment and Medication management   Jesse Obrien is a 55 year old male with a history of alcoholism admitted after a suicide attempt by  cutting his neck while drunk.  1. Suicidal ideation. The patient is able to contract for safety in the hospital.  2. Depression. He has never been compliant with medications in the community. He refuses Celexa due to sexual side effects and is not interested in another antidepressant. Will start Wellbutrin.  3. Insomnia. He is on trazodone.  4. Chronic pain. He is on Neurontin. Today she requests tramadol. The patient does not have a prescriber in the community. He does not appear to be in pain.  5. Alcohol detox. He is on Librium taper. Vital signs were stable.   7. Substance abuse treatment. He now agrees to follow up at the [facility] for outpatient substance abuse treatment.   8. Disposition. He will be  discharged to home with his wife. He will follow up with the [place] for medication management and substance abuse treatment. The patient wants to be discharge on Sunday.  Orson Slick, MD 10/30/2015, 3:59 PM

## 2015-10-30 NOTE — Progress Notes (Signed)
Patient stated that his sister's death is the reason for depression and alcohol abuse.Patient denies suicidal ideations now.His main focus today was to get pain medicines for his back.Compliant with medications.No signs of withdrawal verbalized except mild anxiety.Attended groups.Appropaiate with staff & peers.

## 2015-10-30 NOTE — Plan of Care (Signed)
Problem: Ineffective individual coping Goal: STG: Patient will remain free from self harm Outcome: Progressing Pt remains free from harm.  Problem: Diagnosis: Increased Risk For Suicide Attempt Goal: LTG-Patient Will Report Improved Mood and Deny Suicidal LTG (by discharge) Patient will report improved mood and deny suicidal ideation.  Outcome: Progressing Pt denies SI at this time. Goal: LTG-Patient Will Report Absence of Withdrawal Symptoms LTG (by discharge): Patient will report absence of withdrawal symptoms.  Outcome: Progressing Pt denies withdrawal symptoms at this time.

## 2015-10-30 NOTE — Plan of Care (Signed)
Problem: Diagnosis: Increased Risk For Suicide Attempt Goal: LTG-Patient Will Report Improved Mood and Deny Suicidal LTG (by discharge) Patient will report improved mood and deny suicidal ideation.  Outcome: Progressing Denies suicidal ideations.     

## 2015-10-30 NOTE — Progress Notes (Signed)
D: Pt is seen in the milieu with minimal interaction with staff and peers. Affect is flat. He denies SI/HI/AVH at this time. Pt reports chronic back pain which he rates 5/10. Denies withdrawal symptoms. Pt states his goal for today was "to talk to the doctor and social worker." A: Emotional support and encouragement provided. Medications administered with education. q15 minute safety checks maintained. R: Pt remains free from harm. Will continue to monitor.

## 2015-10-30 NOTE — Plan of Care (Signed)
Problem: Alteration in mood Goal: LTG-Patient reports reduction in suicidal thoughts (Patient reports reduction in suicidal thoughts and is able to verbalize a safety plan for whenever patient is feeling suicidal)  Outcome: Progressing Pt denies SI at this time     

## 2015-10-30 NOTE — Progress Notes (Signed)
Recreation Therapy Notes  Date: 04.28.17 Time: 1:00 pm Location: Craft Room  Group Topic: Communication, Problem solving, Teamwork  Goal Area(s) Addresses:  Patient will effectively work with peer towards shared goal. Patient will identify skills used to make activity successful. Patient will identify benefit of using group skills effectively post d/c.  Behavioral Response: Attentive, Interactive  Intervention: [employer]  Activity: Patients were given 15 pipe cleaners and instructed to build a free standing tower using al 15 pipe cleaners. Patients were given 2 minutes to strategize. After approximately 5 minutes of building, patients were instructed to put their dominant hand behind their back. After approximately 3 minutes, patients were instructed to stop talking to each other.  Education: LRT educated patients on healthy support systems.  Education Outcome: In group clarification offered   Clinical Observations/Feedback: Patient worked with peers to build a tower. Patient used effective communication, problem solving, and teamwork skills. Patient contributed to group discussion by stating what skills his team used to be successful, why communication, problem solving, and teamwork are important skills, and what prevents him from using these skills at home.  Leonette Monarch, LRT/CTRS 10/30/2015 2:54 PM

## 2015-10-30 NOTE — BHH Group Notes (Signed)
[facility] LCSW Group Therapy  10/30/2015 3:39 PM   Type of Therapy: Group Therapy  Participation Level: Minimal  Participation Quality: Attentive  Affect: Flat  Cognitive: Alert  Insight: Limited  Engagement in Therapy: Limited  Modes of Intervention: Discussion, Education, Socialization and Support  Summary of Progress/Problems: Emotional Regulation: Patients will identify both negative and positive emotions. They will discuss emotions they have difficulty regulating and how they impact their lives. Patients will be asked to identify healthy coping skills to combat unhealthy reactions to negative emotions. Pt attended group and stayed the entire time. Pt sat quietly and listened to other group members share.   [place] MSW, [place]  10/30/2015 3:39 PM

## 2015-10-30 NOTE — Progress Notes (Signed)
Recreation Therapy Notes  INPATIENT RECREATION THERAPY ASSESSMENT  Patient Details Name: Jesse Obrien MRN: [id number] DOB: 29-Jul-1960 Today's Date: 10/30/2015  Patient Stressors: Relationship, Death, Other (Comment) (Wife doesn't spend time with him - she spends her time with other family members; sister passed away last week; "Being here putting up with two big mouths")  Coping Skills:   Isolate, Substance Abuse, Self-Injury, Exercise, Talking, Music, Sports, Other (Comment) (Call time out when need to)  Personal Challenges: Substance Abuse  Leisure Interests (2+):  Individual - Other (Comment), Nature - Fishing (Therapist, music work, yard work, take care of dogs, walk)  Futures trader Resources:  Yes  Community Resources:  Other (Comment) [employer])  Current Use: Yes  If no, Barriers?:    Patient Strengths:  Proactive, determined  Patient Identified Areas of Improvement:  stop drinking and smoking cigarettes  Current Recreation Participation:  Watching TV, taking care of dogs, yard work, taking care of house  Patient Goal for Hospitalization:  To go back home  [place] of Residence:  [place] of Residence:  [place]   Current SI (including self-harm):  No  Current HI:  No  Consent to Intern Participation: N/A  LRT offered one-to-one treatment sessions to help find coping skills for the alcohol use, but patient stated he did not need help. LRT will not develop a Recreational Therapy Care Plan at this time. If patient's status changes, LRT will develop a Recreational Therapy Care Plan.  Leonette Monarch, LRT/CTRS 10/30/2015, 3:13 PM

## 2015-10-30 NOTE — Progress Notes (Signed)
D: Pt denies SI/HI/AVH. Pt is pleasant and cooperative. Pt is concerned that he has chronic pain. Pt was encouraged to utilize non-pharmacological methods of pain relief and to possibly look into CAM when he D/C , but discuss with his doctor before he tries anything . Pt entire focus is revolved around pain.   A: Pt was offered support and encouragement. Pt was given scheduled medications. Pt was encourage to attend groups. Q 15 minute checks were done for safety.   R:Pt attends groups and interacts well with peers and staff. Pt is taking medication. Pt receptive to treatment and safety maintained on unit.

## 2015-10-30 NOTE — BHH Suicide Risk Assessment (Signed)
Jesse Obrien INPATIENT:  Family/Significant Other Suicide Prevention Education  Suicide Prevention Education:  Education Completed; Jesse Obrien (wife) [phone] has been identified by the patient as the family member/significant other with whom the patient will be residing, and identified as the person(s) who will aid the patient in the event of a mental health crisis (suicidal ideations/suicide attempt).  With written consent from the patient, the family member/significant other has been provided the following suicide prevention education, prior to the and/or following the discharge of the patient.  The suicide prevention education provided includes the following:  Suicide risk factors  Suicide prevention and interventions  National Suicide Hotline telephone number  [facility] assessment telephone number  [facility] Emergency Assistance [place] and/or Residential Mobile Crisis Unit telephone number  Request made of family/significant other to:  Remove weapons (e.g., guns, rifles, knives), all items previously/currently identified as safety concern.    Remove drugs/medications (over-the-counter, prescriptions, illicit drugs), all items previously/currently identified as a safety concern.  The family member/significant other verbalizes understanding of the suicide prevention education information provided.  The family member/significant other agrees to remove the items of safety concern listed above.  Keene Breath, MSW, LCSW 10/30/2015, 12:24 PM

## 2015-10-31 MED ORDER — MELOXICAM 7.5 MG PO TABS
7.5000 mg | ORAL_TABLET | Freq: Two times a day (BID) | ORAL | Status: DC
  Administered 2015-10-31 – 2015-11-01 (×2): 7.5 mg via ORAL
  Filled 2015-10-31 (×2): qty 1

## 2015-10-31 NOTE — Progress Notes (Signed)
Pt has been pleasant and cooperative. Pt's mood and affect has been depressed. Pt has been interacting appropriately on the unit . Pt has attended most unit activities.Pt denies SI and A/V hallucinations. Pt detoxing well.

## 2015-10-31 NOTE — Progress Notes (Signed)
[facility] MD Progress Note  10/31/2015 4:37 PM Jesse Obrien  MRN:  [id number]  Subjective:  Jesse Obrien is a 55 year old male with a history of depression, mood instability, and episodic drinking. The patient is noncompliant with treatment in the community for his mental health, substance abuse and medical problems. He was admitted after suicide attempt by cutting his throat that required stitches while drunk. He is on Librium taper and has no symptoms of alcohol withdrawal. He wants to be discharged on Sunday.  Jesse Obrien for the first time complaints of back pain today and requests tramadol. He does not have a prescriber in the community. We will not be able to offer controlled substances. He did not remember off his back pain until today. He is on Librium taper and seems to tolerate it well. There are no symptoms of withdrawal and vital signs are stable. The patient is unconvinced that he needs any medications for depression. He declines SSRIs as they cause sexual side effects. We started Wellbutrin. The patient wants to be discharged on Sunday.  Follow-up Saturday the 29th. Patient says that emotionally he is feeling completely normal. Denies any suicidal ideation. Denies feeling depressed. He is able to reflect in a fairly reasonable way on the way that the death of his sister has affected him. He has insight into the way that alcohol causes him to have such an extreme change in his behavior. No new complaints today. Continues to have chronic back pain.  Principal Problem: Major depressive disorder, recurrent, severe with psychotic features (Jesse Obrien) Diagnosis:   Patient Active Problem List   Diagnosis Date Noted  . Suicidal behavior [F48.9] 10/28/2015  . Noncompliance [Z91.19] 10/28/2015  . Tobacco use disorder [F17.200] 10/28/2015  . Substance induced mood disorder ([place]) [F19.94] 10/28/2015  . GERD (gastroesophageal reflux disease) [K21.9] 10/28/2015  . Alcohol use disorder, severe, dependence ([place])  [F10.20] 03/20/2015  . DDD (degenerative disc disease), lumbar [M51.36] 03/20/2015  . Osteoarthrosis, unspecified whether generalized or localized, involving lower leg [M17.9] 03/20/2015  . Spinal stenosis of lumbar region [M48.06] 03/20/2015  . Major depressive disorder, recurrent, severe with psychotic features (Jesse Obrien) [F33.3] 03/19/2015  . Self inflicted neck laceration [T14.8] 01/02/2015  . Hypertension [I10] 01/02/2015   Total Time spent with patient: 20 minutes  Past Psychiatric History: Depression, suicide attempts, alcohol abuse.  Past Medical History:  Past Medical History  Diagnosis Date  . Hypertension 01/02/2015  . DJD (degenerative joint disease)    History reviewed. No pertinent past surgical history. Family History: History reviewed. No pertinent family history. Family Psychiatric  History: See H&P. Social History:  History  Alcohol Use  . Yes     History  Drug Use No    Social History   Social History  . Marital Status: Married    Spouse Name: N/A  . Number of Children: N/A  . Years of Education: N/A   Social History Main Topics  . Smoking status: Current Every Day Smoker -- 15 years  . Smokeless tobacco: None  . Alcohol Use: Yes  . Drug Use: No  . Sexual Activity: Yes    Birth Control/ Protection: Condom   Other Topics Concern  . None   Social History Narrative   Additional Social History:                         Sleep: Fair  Appetite:  Fair  Current Medications: Current Facility-Administered Medications  Medication Dose Route Frequency Provider Last  Rate Last Dose  . acetaminophen (TYLENOL) tablet 650 mg  650 mg Oral Q6H PRN Gonzella Lex, MD   650 mg at 10/29/15 0906  . alum & mag hydroxide-simeth (MAALOX/MYLANTA) 200-200-20 MG/5ML suspension 30 mL  30 mL Oral Q4H PRN Gonzella Lex, MD      . chlordiazePOXIDE (LIBRIUM) capsule 25 mg  25 mg Oral QID Jolanta B Pucilowska, MD   25 mg at 10/31/15 1309  . citalopram (CELEXA) tablet  20 mg  20 mg Oral Daily Gonzella Lex, MD   20 mg at 10/31/15 0900  . gabapentin (NEURONTIN) capsule 400 mg  400 mg Oral TID Gonzella Lex, MD   400 mg at 10/31/15 0900  . LORazepam (ATIVAN) tablet 0-4 mg  0-4 mg Oral Q12H Gonzella Lex, MD   1 mg at 10/30/15 1206  . magnesium hydroxide (MILK OF MAGNESIA) suspension 30 mL  30 mL Oral Daily PRN Gonzella Lex, MD      . meloxicam (MOBIC) tablet 7.5 mg  7.5 mg Oral BID AC Gonzella Lex, MD   7.5 mg at 10/31/15 1310  . pantoprazole (PROTONIX) EC tablet 40 mg  40 mg Oral Daily Gonzella Lex, MD   40 mg at 10/31/15 0900  . traZODone (DESYREL) tablet 100 mg  100 mg Oral QHS Clovis Fredrickson, MD   100 mg at 10/30/15 2154    Lab Results: No results found for this or any previous visit (from the past 48 hour(s)).  Blood Alcohol level:  Lab Results  Component Value Date   ETH 278* 10/28/2015   ETH 256* 03/19/2015    Physical Findings: AIMS:  , ,  ,  ,    CIWA:  CIWA-Ar Total: 7 COWS:     Musculoskeletal: Strength & Muscle Tone: within normal limits Gait & Station: normal Patient leans: N/A  Psychiatric Specialty Exam: Review of Systems  Constitutional: Negative.   HENT: Negative.   Eyes: Negative.   Respiratory: Negative.   Cardiovascular: Negative.   Gastrointestinal: Negative.   Musculoskeletal: Positive for back pain.  Skin: Negative.   Neurological: Negative.   Psychiatric/Behavioral: Positive for substance abuse. Negative for depression, suicidal ideas, hallucinations and memory loss. The patient is not nervous/anxious and does not have insomnia.   All other systems reviewed and are negative.   Blood pressure 125/88, pulse 63, temperature 98 F (36.7 C), temperature source Oral, resp. rate 18, height 5\' 10"  (1.778 m), weight 90.719 kg (200 lb), SpO2 96 %.Body mass index is 28.7 kg/(m^2).  General Appearance: Casual  Eye Contact::  Fair  Speech:  Clear and Coherent  Volume:  Normal  Mood:  Angry and Anxious   Affect:  Congruent  Thought Process:  Goal Directed  Orientation:  Full (Time, Place, and Person)  Thought Content:  WDL  Suicidal Thoughts:  Yes.  with intent/plan  Homicidal Thoughts:  No  Memory:  Immediate;   Fair Recent;   Fair Remote;   Fair  Judgement:  Poor  Insight:  Lacking  Psychomotor Activity:  Normal  Concentration:  Fair  Recall:  [employer] of Knowledge:Fair  Language: Fair  Akathisia:  No  Handed:  Right  AIMS (if indicated):     Assets:  Communication Skills Desire for Improvement Financial Resources/Insurance Housing Intimacy Resilience Social Support  ADL's:  Intact  Cognition: WNL  Sleep:  Number of Hours: 7.15   Treatment Plan Summary: Daily contact with patient to assess and evaluate symptoms  and progress in treatment and Medication management   Mr. Daman is a 55 year old male with a history of alcoholism admitted after a suicide attempt by cutting his neck while drunk.  1. Suicidal ideation. The patient is able to contract for safety in the hospital.  2. Depression. He has never been compliant with medications in the community. He refuses Celexa due to sexual side effects and is not interested in another antidepressant. Will start Wellbutrin.  3. Insomnia. He is on trazodone.  4. Chronic pain. He is on Neurontin. Today she requests tramadol. The patient does not have a prescriber in the community. He does not appear to be in pain. I have added Moby Dick at a low daily dose to see if that can help with his chronic back pain.  5. Alcohol detox. He is on Librium taper. Vital signs were stable.   7. Substance abuse treatment. He now agrees to follow up at the [facility] for outpatient substance abuse treatment.   8. Disposition. He will be discharged to home with his wife. He will follow up with the [place] for medication management and substance abuse treatment. The patient wants to be discharge on Sunday.  Anticipate likely discharge tomorrow. Reviewed  with patient the obvious danger of going back to drinking again. He understands. Currently does not appear to be dangerous or suicidal. Not having active withdrawal symptoms.  Alethia Berthold, MD 10/31/2015, 4:37 PM

## 2015-11-01 MED ORDER — TRAZODONE HCL 100 MG PO TABS: 100.0000 mg | ORAL_TABLET | Freq: Every day | ORAL | Status: DC

## 2015-11-01 MED ORDER — PANTOPRAZOLE SODIUM 40 MG PO TBEC: 40.0000 mg | DELAYED_RELEASE_TABLET | Freq: Every day | ORAL | Status: DC

## 2015-11-01 MED ORDER — MELOXICAM 7.5 MG PO TABS: 7.5000 mg | ORAL_TABLET | Freq: Two times a day (BID) | ORAL | Status: DC

## 2015-11-01 MED ORDER — BUPROPION HCL ER (XL) 150 MG PO TB24
150.0000 mg | ORAL_TABLET | Freq: Every day | ORAL | Status: DC
  Administered 2015-11-01: 150 mg via ORAL
  Filled 2015-11-01: qty 1

## 2015-11-01 MED ORDER — BUPROPION HCL ER (XL) 150 MG PO TB24: 150.0000 mg | ORAL_TABLET | Freq: Every day | ORAL | Status: DC

## 2015-11-01 MED ORDER — GABAPENTIN 400 MG PO CAPS: 400.0000 mg | ORAL_CAPSULE | Freq: Three times a day (TID) | ORAL | Status: DC
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
## 2015-11-01 NOTE — Plan of Care (Signed)
Problem: Alteration in mood Goal: LTG-Patient reports reduction in suicidal thoughts (Patient reports reduction in suicidal thoughts and is able to verbalize a safety plan for whenever patient is feeling suicidal)  Outcome: Progressing Pt denies SI at this time     

## 2015-11-01 NOTE — Progress Notes (Signed)
  [facility] Adult Case Management Discharge Plan :  Will you be returning to the same living situation after discharge:  Yes,  home At discharge, do you have transportation home?: Yes,  family  Do you have the ability to pay for your medications: Yes,  insurance, income   Release of information consent forms completed and in the chart;  Patient's signature needed at discharge.  Patient to Follow up at: Follow-up Information    Follow up with [facility].   Why:  No appts available so patient will be seen at [facility]. Only available appts are Wednesdays throughout the month of May.    Contact information:   [place]  [phone] [phone]      Follow up with [facility]. Go on 11/04/2015.   Why:  For follow-up care appt Wednesday 11/04/15 at 9:00am   Contact information:   [address] [place], [place] Ph [phone] Fax [phone] Ardelle Lesches       Next level of care provider has access to [place] and Suicide Prevention discussed: Yes,  with patient and wife.   Have you used any form of tobacco in the last 30 days? (Cigarettes, Smokeless Tobacco, Cigars, and/or Pipes): Yes  Has patient been referred to the Quitline?: Patient refused referral  Patient has been referred for addiction treatment: Yes  [place] MSW, [place]  11/01/2015, 10:20 AM

## 2015-11-01 NOTE — Progress Notes (Signed)
D: Pt denies SI/HI/AV. Pt is pleasant and cooperative. Pt stated he was going to look into CAM to talk with his dr when he D/C.   A: Pt was offered support and encouragement. Pt was given scheduled medications. Pt was encourage to attend groups. Q 15 minute checks were done for safety.   R:Pt attends groups and interacts well with peers and staff. Pt is taking medication. Pt has no complaints at this time .Pt receptive to treatment and safety maintained on unit.

## 2015-11-01 NOTE — Tx Team (Signed)
Interdisciplinary Treatment Plan Update (Adult)  Date:  11/01/2015 Time Reviewed:  10:40 AM  Progress in Treatment: Attending groups: Yes. Participating in groups:  Yes. Taking medication as prescribed:  Yes. Tolerating medication:  Yes. Family/Significant othe contact made:  Yes, individual(s) contacted:  wife Patient understands diagnosis:  Yes. Discussing patient identified problems/goals with staff:  Yes. Medical problems stabilized or resolved:  Yes. Denies suicidal/homicidal ideation: Yes. Issues/concerns per patient self-inventory:  Yes. Other:  New problem(s) identified: No, Describe:  NA  Discharge Plan or Barriers:Pt plans to return home and follow up with outpatient.    Reason for Continuation of Hospitalization: Depression Medication stabilization Suicidal ideation  Comments:Mr. Rondon is a 55 year old male with a history of depression, mood instability, and episodic drinking. The patient is noncompliant with treatment in the community for his mental health, substance abuse and medical problems. He was admitted after suicide attempt by cutting his throat that required stitches while drunk. He is on Librium taper and has no symptoms of alcohol withdrawal. He wants to be discharged on Sunday. Mr. Sweetin for the first time complaints of back pain today and requests tramadol. He does not have a prescriber in the community. We will not be able to offer controlled substances. He did not remember off his back pain until today. He is on Librium taper and seems to tolerate it well. There are no symptoms of withdrawal and vital signs are stable. The patient is unconvinced that he needs any medications for depression. He declines SSRIs as they cause sexual side effects. We started Wellbutrin. The patient wants to be discharged on Sunday. Follow-up Saturday the 29th. Patient says that emotionally he is feeling completely normal. Denies any suicidal ideation. Denies feeling depressed. He is  able to reflect in a fairly reasonable way on the way that the death of his sister has affected him. He has insight into the way that alcohol causes him to have such an extreme change in his behavior. No new complaints today. Continues to have chronic back pain.   Estimated length of stay: Pt will likely d/c today.   New goal(s):  Review of initial/current patient goals per problem list:   1.  Goal(s): Patient will participate in aftercare plan * Met:  * Target date: at discharge * As evidenced by: Patient will participate within aftercare plan AEB aftercare provider and housing plan at discharge being identified.   2.  Goal (s): Patient will exhibit decreased depressive symptoms and suicidal ideations. * Met:  *  Target date: at discharge * As evidenced by: Patient will utilize self rating of depression at 3 or below and demonstrate decreased signs of depression or be deemed stable for discharge by MD.   3.  Goal(s): Patient will demonstrate decreased signs and symptoms of anxiety. * Met:  * Target date: at discharge * As evidenced by: Patient will utilize self rating of anxiety at 3 or below and demonstrated decreased signs of anxiety, or be deemed stable for discharge by MD   4.  Goal(s): Patient will demonstrate decreased signs of withdrawal due to substance abuse * Met:  * Target date: at discharge * As evidenced by: Patient will produce a CIWA/COWS score of 0, have stable vitals signs, and no symptoms of withdrawal.  Attendees: Patient:  Jesse Obrien 4/30/201710:40 AM  Family:   4/30/201710:40 AM  Physician:  Dr. Weber Cooks  4/30/201710:40 AM  Nursing:   Ozzie Hoyle, RN  4/30/201710:40 AM  Case Manager:   4/30/201710:40 AM  Counselor:   4/30/201710:40 AM  Other:  [place] 4/30/201710:40 AM  Other:   4/30/201710:40 AM  Other:   4/30/201710:40 AM  Other:  4/30/201710:40 AM  Other:  4/30/201710:40 AM  Other:  4/30/201710:40 AM  Other:  4/30/201710:40 AM  Other:   4/30/201710:40 AM  Other:  4/30/201710:40 AM  Other:   4/30/201710:40 AM   Scribe for Treatment Team:   Wray Kearns, MSW, LCSWA  11/01/2015, 10:40 AM

## 2015-11-01 NOTE — BHH Group Notes (Signed)
[facility] Group Notes:  (Nursing/MHT/Case Management/Adjunct)  Date:  11/01/2015  Time:  5:51 AM  Type of Therapy:  Group Therapy  Participation Level:  Active  Participation Quality:  Appropriate  Affect:  Appropriate  Cognitive:  Appropriate  Insight:  Good  Engagement in Group:  Engaged  Modes of Intervention:  N/A  Summary of Progress/Problems:  Marylynn Pearson 11/01/2015, 5:51 AM

## 2015-11-01 NOTE — Progress Notes (Signed)
Pt discharged from behavior med. All instructions were reviewed and pt stated they would comply. Pt denies SI and A/V hallucinations. All belongings returned to pt.

## 2015-11-01 NOTE — Discharge Summary (Signed)
Physician Discharge Summary Note  Patient:  Jesse Obrien is an 55 y.o., male MRN:  [id number] DOB:  [birth year] Patient phone:  [phone] (home)  Patient address:   [address] [place] [address],  Total Time spent with patient: 35 minutes  Date of Admission:  10/28/2015 Date of Discharge: 11/01/2015  Reason for Admission:  Patient was admitted through the emergency room where he was brought by local emergency services. Patient was intoxicated and had intentionally cut himself on his neck. Patient was admitted to the psychiatric hospital after being medically stabilized for the laceration. Monitor closely for any further suicidal behavior. Engaged in substance abuse treatment and monitoring of depression.  Principal Problem: Major depressive disorder, recurrent, severe with psychotic features [facility]) Discharge Diagnoses: Patient Active Problem List   Diagnosis Date Noted  . Suicidal behavior [F48.9] 10/28/2015  . Noncompliance [Z91.19] 10/28/2015  . Tobacco use disorder [F17.200] 10/28/2015  . Substance induced mood disorder ([place]) [F19.94] 10/28/2015  . GERD (gastroesophageal reflux disease) [K21.9] 10/28/2015  . Alcohol use disorder, severe, dependence ([place]) [F10.20] 03/20/2015  . DDD (degenerative disc disease), lumbar [M51.36] 03/20/2015  . Osteoarthrosis, unspecified whether generalized or localized, involving lower leg [M17.9] 03/20/2015  . Spinal stenosis of lumbar region [M48.06] 03/20/2015  . Major depressive disorder, recurrent, severe with psychotic features ([place]) [F33.3] 03/19/2015  . Self inflicted neck laceration [T14.8] 01/02/2015  . Hypertension [I10] 01/02/2015    Past Psychiatric History: Patient has a history of recurrent episodes of similar or identical behavior. He has previously cut himself on the neck and elsewhere when intoxicated. He has been diagnosed with alcohol abuse, substance-induced mood disorder, major depression, and has not followed up with  appropriate treatment in the community for these things. He does not have a history of delirium tremens and has a history of being able to detox safely. He does not report suicidal ideation or tend act on suicidal ideation while he is sober.  Past Medical History:  Past Medical History  Diagnosis Date  . Hypertension 01/02/2015  . DJD (degenerative joint disease)    History reviewed. No pertinent past surgical history. Family History: History reviewed. No pertinent family history. Family Psychiatric  History: Family history for alcohol use Social History:  History  Alcohol Use  . Yes     History  Drug Use No    Social History   Social History  . Marital Status: Married    Spouse Name: N/A  . Number of Children: N/A  . Years of Education: N/A   Social History Main Topics  . Smoking status: Current Every Day Smoker -- 15 years  . Smokeless tobacco: None  . Alcohol Use: Yes  . Drug Use: No  . Sexual Activity: Yes    Birth Control/ Protection: Condom   Other Topics Concern  . None   Social History Narrative    Hospital Course:  Patient was admitted to psychiatry on close observation. He was observed to be calm and appropriate in his behavior. Did not state or engage in any suicidal ideation or behavior. Patient was given medication for alcohol withdrawal which he tolerated well. There were no seizures, vital signs were stable, no sign of delirium. He appears to of detoxed without any incident. Patient admits that his mood is intermittently down but denies that he feels severely depressed when sober. He has consistently denied any suicidal ideation wish or plan in the hospital. He has engaged in some individual conversation and therapeutic assessment of his clinical needs.  At this point the patient completely denies suicidality, denies acute depression, states an understanding that alcohol use is toxic to the point of being life-threatening for him. He has been referred to [place]  programs as well as to local substance abuse programs. He has been encouraged strongly to do everything he can to discontinue alcohol use. This hospital stay he has been started on low book dose Wellbutrin as citalopram had not seemed to be helpful in the past. He will be discharged on Wellbutrin and trazodone as psychiatric medicines the latter primarily for sleep and also for anxiety and mood. He will be discharged on modest dose of meloxicam for his chronic pain as he has not been compliant with outpatient pain treatment. No longer meets commitment criteria. Although it is acknowledged that he is at long-term serious risk for repeat behavior which could potentially be life endangering there is no benefit to further hospital level treatment and he is not an acute danger while sober.  Physical Findings: AIMS:  , ,  ,  ,    CIWA:  CIWA-Ar Total: 7 COWS:     Musculoskeletal: Strength & Muscle Tone: within normal limits Gait & Station: normal Patient leans: N/A  Psychiatric Specialty Exam: Review of Systems  Constitutional: Negative.   HENT: Negative.   Eyes: Negative.   Respiratory: Negative.   Cardiovascular: Negative.   Gastrointestinal: Negative.   Musculoskeletal: Positive for back pain and joint pain.  Skin: Negative.   Neurological: Negative.   Psychiatric/Behavioral: Positive for substance abuse. Negative for depression, suicidal ideas, hallucinations and memory loss. The patient has insomnia. The patient is not nervous/anxious.     Blood pressure 133/81, pulse 71, temperature 97.7 F (36.5 C), temperature source Oral, resp. rate 18, height 5\' 10"  (1.778 m), weight 90.719 kg (200 lb), SpO2 96 %.Body mass index is 28.7 kg/(m^2).  General Appearance: Fairly Groomed  Engineer, water::  Good  Speech:  Slow  Volume:  Normal  Mood:  Euthymic  Affect:  Congruent  Thought Process:  Goal Directed  Orientation:  Full (Time, Place, and Person)  Thought Content:  Negative  Suicidal  Thoughts:  No  Homicidal Thoughts:  No  Memory:  Immediate;   Good Recent;   Fair Remote;   Fair  Judgement:  Fair  Insight:  Fair  Psychomotor Activity:  Normal  Concentration:  Fair  Recall:  [employer] [place]  Language: Fair  Akathisia:  No  Handed:  Right  AIMS (if indicated):     Assets:  Agricultural consultant Housing Intimacy Resilience Social Support  ADL's:  Intact  Cognition: WNL  Sleep:  Number of Hours: 6.15   Have you used any form of tobacco in the last 30 days? (Cigarettes, Smokeless Tobacco, Cigars, and/or Pipes): Yes  Has this patient used any form of tobacco in the last 30 days? (Cigarettes, Smokeless Tobacco, Cigars, and/or Pipes) Yes, Yes, A prescription for an FDA-approved tobacco cessation medication was offered at discharge and the patient refused  Blood Alcohol level:  Lab Results  Component Value Date   [facility] 278* 10/28/2015   ETH 256* 0000000    Metabolic Disorder Labs:  No results found for: HGBA1C, MPG No results found for: PROLACTIN Lab Results  Component Value Date   TRIG 288* 12/28/2013    See Psychiatric Specialty Exam and Suicide Risk Assessment completed by Attending Physician prior to discharge.  Discharge destination:  Home  Is patient on multiple antipsychotic therapies at discharge:  No  Has Patient had three or more failed trials of antipsychotic monotherapy by history:  No  Recommended Plan for Multiple Antipsychotic Therapies: NA     Medication List    ASK your doctor about these medications      Indication   cephALEXin 500 MG capsule  Commonly known as:  KEFLEX  Take 1 capsule (500 mg total) by mouth every 8 (eight) hours.      citalopram 20 MG tablet  Commonly known as:  CELEXA  Take 1 tablet (20 mg total) by mouth daily.      diazepam 5 MG tablet  Commonly known as:  VALIUM  Take 1 tablet (5 mg total) by mouth every 8 (eight) hours as needed for anxiety.       gabapentin 400 MG capsule  Commonly known as:  NEURONTIN  Take 1 capsule (400 mg total) by mouth 3 (three) times daily.      lidocaine 5 %  Commonly known as:  LIDODERM  Place 1 patch onto the skin daily. Remove & Discard patch within 12 hours or as directed by MD      lidocaine 5 %  Commonly known as:  LIDODERM  Place 1 patch onto the skin every 12 (twelve) hours. Remove & Discard patch within 12 hours or as directed by MD      meloxicam 15 MG tablet  Commonly known as:  MOBIC  Take 1 tablet (15 mg total) by mouth daily.      mupirocin ointment 2 %  Commonly known as:  BACTROBAN  Place into the nose 2 (two) times daily.      oxyCODONE-acetaminophen 5-325 MG tablet  Commonly known as:  ROXICET  Take 1 tablet by mouth every 6 (six) hours as needed.      pantoprazole 40 MG tablet  Commonly known as:  PROTONIX  Take 1 tablet (40 mg total) by mouth daily.      traZODone 150 MG tablet  Commonly known as:  DESYREL  Take 1 tablet (150 mg total) by mouth at bedtime.            Follow-up Information    Follow up with [facility].   Why:  No appts available so patient will be seen at [facility]. Only available appts are Wednesdays throughout the month of May.    Contact information:   [place] [address] [phone]      Follow up with [facility]. Go on 11/04/2015.   Why:  For follow-up care appt Wednesday 11/04/15 at 9:00am   Contact information:   [address] [place], [place] Ph [phone] Fax [phone] Ardelle Lesches       Follow-up recommendations:  Activity:  Increase activity as tolerated to normal. Diet:  Regular diet Other:  Strongly encouraged him to engage with outpatient medical and substance abuse treatment  Comments:  Patient is to be discharged today. He has been educated and expresses an understanding of the life or death need to stop  drinking. He has appropriate referral information. Tolerating medicine well. He agrees to the plan.  Signed: Alethia Berthold, MD 11/01/2015, 12:07 PM

## 2015-11-01 NOTE — [facility] Suicide Risk Assessment (Signed)
[facility] Admission Suicide Risk Assessment   Nursing information obtained from:    conversation with nursing on duty and review of chart Demographic factors:    older retired gentleman with chronic pain, alcohol abuse, history of depression, noncompliance with treatment, recurrent dangerous behavior when intoxicated Current Mental Status:    alert and oriented. Cooperative. Good eye contact. Blunted affect mood stated as being okay. Denies suicidal thoughts. No evidence of psychosis. Agrees to the need for outpatient treatment. No sign of delirium. Loss Factors:    sister recently passed away Historical Factors:    multiple hospital visits for self injury while intoxicated Risk Reduction Factors:    patient needs to engage with outpatient substance abuse treatment and he is quite aware of this. Also needs to engage with medical care for treatment of his chronic pain.  Total Time spent with patient: 1 hour Principal Problem: Major depressive disorder, recurrent, severe with psychotic features ([place]) Diagnosis:   Patient Active Problem List   Diagnosis Date Noted  . Suicidal behavior [F48.9] 10/28/2015  . Noncompliance [Z91.19] 10/28/2015  . Tobacco use disorder [F17.200] 10/28/2015  . Substance induced mood disorder ([place]) [F19.94] 10/28/2015  . GERD (gastroesophageal reflux disease) [K21.9] 10/28/2015  . Alcohol use disorder, severe, dependence ([place]) [F10.20] 03/20/2015  . DDD (degenerative disc disease), lumbar [M51.36] 03/20/2015  . Osteoarthrosis, unspecified whether generalized or localized, involving lower leg [M17.9] 03/20/2015  . Spinal stenosis of lumbar region [M48.06] 03/20/2015  . Major depressive disorder, recurrent, severe with psychotic features ([place]) [F33.3] 03/19/2015  . Self inflicted neck laceration [T14.8] 01/02/2015  . Hypertension [I10] 01/02/2015   Subjective Data: Depression, alcoholism, suicide attempt.  Continued Clinical Symptoms:  Alcohol Use Disorder Identification  Test Final Score (AUDIT): 16 The "Alcohol Use Disorders Identification Test", Guidelines for Use in Primary Care, Second Edition.  World Pharmacologist [facility]). Score between 0-7:  no or low risk or alcohol related problems. Score between 8-15:  moderate risk of alcohol related problems. Score between 16-19:  high risk of alcohol related problems. Score 20 or above:  warrants further diagnostic evaluation for alcohol dependence and treatment.  CLINICAL FACTORS:   Depression:   Impulsivity Severe Alcohol/Substance Abuse/Dependencies   Musculoskeletal: Strength & Muscle Tone: within normal limits Gait & Station: normal Patient leans: N/A  Psychiatric Specialty Exam: Review of Systems  Constitutional: Negative.   HENT: Negative.   Eyes: Negative.   Respiratory: Negative.   Cardiovascular: Negative.   Gastrointestinal: Negative.   Musculoskeletal: Negative.   Skin: Negative.   Neurological: Negative.   Psychiatric/Behavioral: Positive for substance abuse. Negative for depression and suicidal ideas. The patient has insomnia.   All other systems reviewed and are negative.   Blood pressure 133/81, pulse 71, temperature 97.7 F (36.5 C), temperature source Oral, resp. rate 18, height 5\' 10"  (1.778 m), weight 90.719 kg (200 lb), SpO2 96 %.Body mass index is 28.7 kg/(m^2).  General Appearance: Fairly Groomed  Engineer, water::  Good  Speech:  Clear and Coherent  Volume:  Normal  Mood:  Anxious  Affect:  Appropriate  Thought Process:  Goal Directed  Orientation:  Full (Time, Place, and Person)  Thought Content:  Negative  Suicidal Thoughts:  No  Homicidal Thoughts:  No  Memory:  Immediate;   Fair Recent;   Fair Remote;   Fair  Judgement:  Fair  Insight:  Fair and Shallow  Psychomotor Activity:  Normal  Concentration:  Fair  Recall:  [employer] of Knowledge:Fair  Language: Fair  Akathisia:  No  Handed:  Right  AIMS (if indicated):     Assets:  Communication Skills Desire  for Improvement Financial Resources/Insurance Housing Resilience Social Support Transportation  Sleep:  Number of Hours: 6.15  Cognition: WNL  ADL's:  Intact    COGNITIVE FEATURES THAT CONTRIBUTE TO RISK:  None    SUICIDE RISK:    Patient is at minimal short-term risk of suicide while he remains sober. Risk of repeat dangerous behavior is moderate to severe chronically related to alcohol abuse. Unlikely to be changed by any further treatment in the hospital. Patient has been educated multiple times about the life threatening dangers of continued abuse of alcohol. He is aware that there is very good treatment available at the [place] centers and knows how to get that treatment. He has been strongly encouraged to follow-up with [facility] and other physicians to get treatment for his chronic musculoskeletal pain. At this point the patient is stable and at his baseline mental state no longer an acute danger and no longer committable or requiring inpatient treatment.  PLAN OF CARE: hospital admission, medication management, substance abuse counseling, discharge planning.  Mr. Taboada is a 55 year old male with a history of alcoholism admitted after a suicide attempt by cutting his neck while drunk.  1. Suicidal ideation. The patient is able to contract for safety.  2. Depression. He has never been compliant with medications in the community. We continue Celexa.  3. Insomnia. He is on trazodone.  4. Anxiety. He is on Atarax.   5. Alcohol detox. He is on Librium taper. Vital signs were stable. Vital signs continue to be stable off of Librium  7. Substance abuse treatment. We will attempt to place in the [place] program.   8. Disposition. Patient is to be discharged home. No longer committable. Able to make decisions. Denies current suicidal ideation and at least states in agreement to the need and appropriateness of follow-up treatment.    I certify that inpatient services furnished can  reasonably be expected to improve the patient's condition.   Alethia Berthold, MD 11/01/2015, 12:01 PM

## 2015-11-11 ENCOUNTER — Emergency Department
Admission: EM | Admit: 2015-11-11 | Discharge: 2015-11-13 | Disposition: A | Discharge status: Other Acute Inpatient Hospital | Attending: Emergency Medicine | Admitting: Emergency Medicine

## 2015-11-11 DIAGNOSIS — F333 Major depressive disorder, recurrent, severe with psychotic symptoms: Secondary | ICD-10-CM | POA: Diagnosis not present

## 2015-11-11 DIAGNOSIS — F17200 Nicotine dependence, unspecified, uncomplicated: Secondary | ICD-10-CM | POA: Diagnosis not present

## 2015-11-11 DIAGNOSIS — Z91199 Patient's noncompliance with other medical treatment and regimen due to unspecified reason: Secondary | ICD-10-CM

## 2015-11-11 DIAGNOSIS — F10230 Alcohol dependence with withdrawal, uncomplicated: Secondary | ICD-10-CM | POA: Diagnosis present

## 2015-11-11 DIAGNOSIS — F1914 Other psychoactive substance abuse with psychoactive substance-induced mood disorder: Secondary | ICD-10-CM | POA: Insufficient documentation

## 2015-11-11 DIAGNOSIS — Y929 Unspecified place or not applicable: Secondary | ICD-10-CM | POA: Insufficient documentation

## 2015-11-11 DIAGNOSIS — Z9119 Patient's noncompliance with other medical treatment and regimen: Secondary | ICD-10-CM

## 2015-11-11 DIAGNOSIS — F1020 Alcohol dependence, uncomplicated: Secondary | ICD-10-CM | POA: Diagnosis present

## 2015-11-11 DIAGNOSIS — M5136 Other intervertebral disc degeneration, lumbar region: Secondary | ICD-10-CM | POA: Insufficient documentation

## 2015-11-11 DIAGNOSIS — M1990 Unspecified osteoarthritis, unspecified site: Secondary | ICD-10-CM | POA: Insufficient documentation

## 2015-11-11 DIAGNOSIS — X788XXA Intentional self-harm by other sharp object, initial encounter: Secondary | ICD-10-CM | POA: Diagnosis not present

## 2015-11-11 DIAGNOSIS — IMO0002 Reserved for concepts with insufficient information to code with codable children: Secondary | ICD-10-CM

## 2015-11-11 DIAGNOSIS — Y939 Activity, unspecified: Secondary | ICD-10-CM | POA: Diagnosis not present

## 2015-11-11 DIAGNOSIS — M51369 Other intervertebral disc degeneration, lumbar region without mention of lumbar back pain or lower extremity pain: Secondary | ICD-10-CM | POA: Diagnosis present

## 2015-11-11 DIAGNOSIS — S1191XA Laceration without foreign body of unspecified part of neck, initial encounter: Secondary | ICD-10-CM

## 2015-11-11 DIAGNOSIS — Z915 Personal history of self-harm: Secondary | ICD-10-CM | POA: Insufficient documentation

## 2015-11-11 DIAGNOSIS — I10 Essential (primary) hypertension: Secondary | ICD-10-CM | POA: Diagnosis not present

## 2015-11-11 DIAGNOSIS — L089 Local infection of the skin and subcutaneous tissue, unspecified: Secondary | ICD-10-CM

## 2015-11-11 DIAGNOSIS — Y999 Unspecified external cause status: Secondary | ICD-10-CM | POA: Diagnosis not present

## 2015-11-11 DIAGNOSIS — F1994 Other psychoactive substance use, unspecified with psychoactive substance-induced mood disorder: Secondary | ICD-10-CM | POA: Diagnosis present

## 2015-11-11 DIAGNOSIS — K219 Gastro-esophageal reflux disease without esophagitis: Secondary | ICD-10-CM | POA: Diagnosis present

## 2015-11-11 DIAGNOSIS — R4589 Other symptoms and signs involving emotional state: Secondary | ICD-10-CM | POA: Diagnosis present

## 2015-11-11 DIAGNOSIS — Z7289 Other problems related to lifestyle: Secondary | ICD-10-CM

## 2015-11-11 DIAGNOSIS — T1491 Suicide attempt: Secondary | ICD-10-CM | POA: Diagnosis present

## 2015-11-11 DIAGNOSIS — R4689 Other symptoms and signs involving appearance and behavior: Secondary | ICD-10-CM

## 2015-11-11 LAB — CBC
HEMATOCRIT: 41 % (ref 40.0–52.0)
Hemoglobin: 14.1 g/dL (ref 13.0–18.0)
MCH: 33.4 pg (ref 26.0–34.0)
MCHC: 34.3 g/dL (ref 32.0–36.0)
MCV: 97.5 fL (ref 80.0–100.0)
Platelets: 153 10*3/uL (ref 150–440)
RBC: 4.21 MIL/uL — AB (ref 4.40–5.90)
RDW: 13.1 % (ref 11.5–14.5)
WBC: 8.3 10*3/uL (ref 3.8–10.6)

## 2015-11-11 LAB — URINE DRUG SCREEN, QUALITATIVE (ARMC ONLY)
AMPHETAMINES, UR SCREEN: NOT DETECTED
Barbiturates, Ur Screen: NOT DETECTED
Benzodiazepine, Ur Scrn: POSITIVE — AB
COCAINE METABOLITE, UR ~~LOC~~: NOT DETECTED
Cannabinoid 50 Ng, Ur ~~LOC~~: NOT DETECTED
MDMA (ECSTASY) UR SCREEN: NOT DETECTED
METHADONE SCREEN, URINE: NOT DETECTED
OPIATE, UR SCREEN: NOT DETECTED
Phencyclidine (PCP) Ur S: NOT DETECTED
TRICYCLIC, UR SCREEN: NOT DETECTED

## 2015-11-11 LAB — URINALYSIS COMPLETE WITH MICROSCOPIC (ARMC ONLY)
BACTERIA UA: NONE SEEN
BILIRUBIN URINE: NEGATIVE
GLUCOSE, UA: NEGATIVE mg/dL
Ketones, ur: NEGATIVE mg/dL
LEUKOCYTES UA: NEGATIVE
NITRITE: NEGATIVE
Protein, ur: NEGATIVE mg/dL
RBC / HPF: NONE SEEN RBC/hpf (ref 0–5)
Specific Gravity, Urine: 1.002 — ABNORMAL LOW (ref 1.005–1.030)
Squamous Epithelial / LPF: NONE SEEN
WBC UA: NONE SEEN WBC/hpf (ref 0–5)
pH: 6 (ref 5.0–8.0)

## 2015-11-11 LAB — BASIC METABOLIC PANEL
ANION GAP: 12 (ref 5–15)
BUN: 7 mg/dL (ref 6–20)
CO2: 21 mmol/L — AB (ref 22–32)
Calcium: 8.3 mg/dL — ABNORMAL LOW (ref 8.9–10.3)
Chloride: 98 mmol/L — ABNORMAL LOW (ref 101–111)
Creatinine, Ser: 1.01 mg/dL (ref 0.61–1.24)
GLUCOSE: 102 mg/dL — AB (ref 65–99)
POTASSIUM: 3.6 mmol/L (ref 3.5–5.1)
Sodium: 131 mmol/L — ABNORMAL LOW (ref 135–145)

## 2015-11-11 LAB — ETHANOL: Alcohol, Ethyl (B): 246 mg/dL — ABNORMAL HIGH (ref <5)

## 2015-11-11 LAB — SALICYLATE LEVEL

## 2015-11-11 LAB — ACETAMINOPHEN LEVEL: Acetaminophen (Tylenol), Serum: <10 ug/mL — ABNORMAL LOW (ref 10–30)

## 2015-11-11 NOTE — ED Notes (Addendum)
No am meds ordered at this time  - I will monitor CIWA as pt reports that he drinks 18-24 beers everyday   Psych consult pending    assesement completed  He denies pain

## 2015-11-11 NOTE — ED Notes (Signed)
Meal provided - pt awake

## 2015-11-11 NOTE — ED Notes (Addendum)
Pt eating lunch. Pt still agitated and does not want to speak with this Probation officer.  No distress noted. Maintained on 15 minute checks and observation by security camera for safety.

## 2015-11-11 NOTE — ED Notes (Signed)
BEHAVIORAL HEALTH ROUNDING Patient sleeping: No. Patient alert and oriented: yes Behavior appropriate: Yes.  ; If no, describe:  Nutrition and fluids offered: yes Toileting and hygiene offered: Yes  Sitter present: q15 minute observations and security  monitoring Law enforcement present: Yes  ODS  

## 2015-11-11 NOTE — ED Notes (Signed)
Pt came to door cussing about his breakfast and security told pt breakfast was in room. Security ask pt not to be cussing in the hallway and pt stated to security "You need to leave me alone boy". Pt seems very irritable.

## 2015-11-11 NOTE — ED Provider Notes (Signed)
[facility] Emergency Department Provider Note   ____________________________________________  Time seen: Approximately 5:31 AM  I have reviewed the triage vital signs and the nursing notes.   HISTORY  Chief Complaint Suicide Attempt    HPI Jesse Obrien is a 55 y.o. male who presents to the ED from home with [facility] police with a chief complaint of "medicines swolling my body up" and self-inflicted laceration to throat.Patient has a history of depressive mood disorder with multiple frequent prior attempts at cutting his throat. Most recent attempts on 10/28/2015. He is unwilling to participate further in the interview and does not explain what medicines are swelling him up. He does state that he cut his throat "to see blood dripped down". Subsequently he is uncooperative with further interview.   Past Medical History  Diagnosis Date  . Hypertension 01/02/2015  . DJD (degenerative joint disease)     Patient Active Problem List   Diagnosis Date Noted  . Suicidal behavior 10/28/2015  . Noncompliance 10/28/2015  . Tobacco use disorder 10/28/2015  . Substance induced mood disorder ([place]) 10/28/2015  . GERD (gastroesophageal reflux disease) 10/28/2015  . Alcohol use disorder, severe, dependence ([place]) 03/20/2015  . DDD (degenerative disc disease), lumbar 03/20/2015  . Osteoarthrosis, unspecified whether generalized or localized, involving lower leg 03/20/2015  . Spinal stenosis of lumbar region 03/20/2015  . Major depressive disorder, recurrent, severe with psychotic features ([place]) 03/19/2015  . Self inflicted neck laceration 01/02/2015  . Hypertension 01/02/2015    No past surgical history on file.  Current Outpatient Rx  Name  Route  Sig  Dispense  Refill  . buPROPion (WELLBUTRIN XL) 150 MG 24 hr tablet   Oral   Take 1 tablet (150 mg total) by mouth daily.   30 tablet   0   . gabapentin (NEURONTIN) 400 MG capsule   Oral   Take 1 capsule  (400 mg total) by mouth 3 (three) times daily.   90 capsule   0   . lidocaine (LIDODERM) 5 %   Transdermal   Place 1 patch onto the skin daily. Remove & Discard patch within 12 hours or as directed by MD   30 patch   0   . lidocaine (LIDODERM) 5 %   Transdermal   Place 1 patch onto the skin every 12 (twelve) hours. Remove & Discard patch within 12 hours or as directed by MD   10 patch   0   . meloxicam (MOBIC) 7.5 MG tablet   Oral   Take 1 tablet (7.5 mg total) by mouth 2 (two) times daily before a meal.   60 tablet   0   . pantoprazole (PROTONIX) 40 MG tablet   Oral   Take 1 tablet (40 mg total) by mouth daily.   30 tablet   0   . traZODone (DESYREL) 100 MG tablet   Oral   Take 1 tablet (100 mg total) by mouth at bedtime.   30 tablet   0     Allergies Review of patient's allergies indicates no known allergies.  No family history on file.  Social History Social History  Substance Use Topics  . Smoking status: Current Every Day Smoker -- 15 years  . Smokeless tobacco: Not on file  . Alcohol Use: Yes    Review of Systems  Constitutional: No fever/chills. Eyes: No visual changes. ENT: Positive for throat laceration. No sore throat. Cardiovascular: Denies chest pain. Respiratory: Denies shortness of breath. Gastrointestinal: No  abdominal pain.  No nausea, no vomiting.  No diarrhea.  No constipation. Genitourinary: Negative for dysuria. Musculoskeletal: Negative for back pain. Skin: Negative for rash. Neurological: Negative for headaches, focal weakness or numbness. Psychiatric:Positive for self-injurious behavior.  10-point ROS otherwise negative.  ____________________________________________   PHYSICAL EXAM:  VITAL SIGNS: ED Triage Vitals  Enc Vitals Group     BP 11/11/15 0456 123/82 mmHg     Pulse Rate 11/11/15 0456 83     Resp 11/11/15 0456 18     Temp 11/11/15 0456 97.7 F (36.5 C)     Temp Source 11/11/15 0456 Oral     SpO2 11/11/15  0456 95 %     Weight 11/11/15 0456 210 lb (95.255 kg)     Height 11/11/15 0456 5\' 10"  (1.778 m)     Head Cir --      Peak Flow --      Pain Score 11/11/15 0457 6     Pain Loc --      Pain Edu? --      Excl. in [place]? --     Constitutional: Alert and oriented. Well appearing and in no acute distress.Intoxicated. Eyes: Conjunctivae are normal. PERRL. EOMI. Head: Atraumatic. Nose: No congestion/rhinnorhea. Mouth/Throat: Mucous membranes are moist.  Oropharynx non-erythematous. Neck: Well-healed incision from prior laceration. Sutures removed by patient per his report. There is a small 1 cm superficial laceration to the anterior neck without active bleeding or gaping. No stridor.  No carotid bruits. Cardiovascular: Normal rate, regular rhythm. Grossly normal heart sounds.  Good peripheral circulation. Respiratory: Normal respiratory effort.  No retractions. Lungs CTAB. Gastrointestinal: Soft and nontender. No distention. No abdominal bruits. No CVA tenderness. Musculoskeletal: No lower extremity tenderness nor edema.  No joint effusions. Neurologic:  Normal speech and language. No gross focal neurologic deficits are appreciated. No gait instability. Skin:  Skin is warm, dry and intact. No rash noted. Psychiatric: Mood and affect are agitated. Speech and behavior are normal.  ____________________________________________   LABS (all labs ordered are listed, but only abnormal results are displayed)  Labs Reviewed  CBC - Abnormal; Notable for the following:    RBC 4.21 (*)    All other components within normal limits  BASIC METABOLIC PANEL  ETHANOL  URINALYSIS COMPLETEWITH MICROSCOPIC ([facility] ONLY)  URINE DRUG SCREEN, QUALITATIVE ([facility] ONLY)  ACETAMINOPHEN LEVEL  SALICYLATE LEVEL   ____________________________________________  EKG  None ____________________________________________  RADIOLOGY  None ____________________________________________   PROCEDURES  Procedure(s)  performed: None  Critical Care performed: No  ____________________________________________   INITIAL IMPRESSION / ASSESSMENT AND PLAN / ED COURSE  Pertinent labs & imaging results that were available during my care of the patient were reviewed by me and considered in my medical decision making (see chart for details).  55 year old male who is well-known to the emergency department who presents with alcohol intoxication and self-injurious behavior. Minor laceration Dermabonded and Steri-Strips applied per nursing. Will place patient under IVC for his safety. Patient remains in the ED pending TTS and psychiatry consults. Tetanus is up-to-date.  ----------------------------------------- 6:18 AM on 11/11/2015 -----------------------------------------  Patient sleeping in no acute distress. He is medically cleared at this time for psychiatric disposition. ____________________________________________   FINAL CLINICAL IMPRESSION(S) / ED DIAGNOSES  Final diagnoses:  Suicidal behavior  Substance induced mood disorder ([place])  Alcohol use disorder, severe, dependence ([facility])  Laceration of neck, initial encounter  Self-mutilation      NEW MEDICATIONS STARTED DURING THIS VISIT:  New Prescriptions   No medications  on file     Note:  This document was prepared using Dragon voice recognition software and may include unintentional dictation errors.    Paulette Blanch, MD 11/11/15 [phone]

## 2015-11-11 NOTE — ED Notes (Signed)
Patient assigned to appropriate care area. Patient oriented to unit/care area: Informed that, for their safety, care areas are designed for safety and monitored by security cameras at all times; and visiting hours explained to patient. Patient verbalizes understanding, and verbal contract for safety obtained.  Pt sat in dayroom to wait for RN to bring him a pillow. RN handed pt a pillow and tried to speak with him. Pt turned and walked to his room and shut door. Pt appears quite agitated. Maintained on 15 minute checks and observation by security camera for safety.

## 2015-11-11 NOTE — ED Notes (Signed)
Patient asleep in room. No noted distress or abnormal behavior. Will continue 15 minute checks and observation by security cameras for safety. 

## 2015-11-11 NOTE — Consult Note (Signed)
[facility] Face-to-Face Psychiatry Consult   Reason for Consult:  Consult for this 55 year old man with a history of alcohol abuse and depression who presents once again to the emergency room having cut himself on the neck while intoxicated Referring Physician:  Paduchowski Patient Identification: Jesse Obrien MRN:  [id number] Principal Diagnosis: Alcohol use disorder, severe, dependence ([place]) Diagnosis:   Patient Active Problem List   Diagnosis Date Noted  . Suicidal behavior [F48.9] 10/28/2015  . Noncompliance [Z91.19] 10/28/2015  . Tobacco use disorder [F17.200] 10/28/2015  . Substance induced mood disorder ([place]) [F19.94] 10/28/2015  . GERD (gastroesophageal reflux disease) [K21.9] 10/28/2015  . Alcohol use disorder, severe, dependence ([place]) [F10.20] 03/20/2015  . DDD (degenerative disc disease), lumbar [M51.36] 03/20/2015  . Osteoarthrosis, unspecified whether generalized or localized, involving lower leg [M17.9] 03/20/2015  . Spinal stenosis of lumbar region [M48.06] 03/20/2015  . Major depressive disorder, recurrent, severe with psychotic features ([place]) [F33.3] 03/19/2015  . Self inflicted neck laceration [T14.8] 01/02/2015  . Hypertension [I10] 01/02/2015    Total Time spent with patient: 45 minutes  Subjective:   Jesse Obrien is a 55 y.o. male patient admitted with "I just want to be left alone".  HPI:  Patient interviewed. Chart reviewed. Labs and vitals reviewed. Patient came into the emergency room early this morning once again having cut himself on the neck intentionally while intoxicated. Reportedly he told intake at that time that he was not trying to kill himself but registered just wanted to see himself bleed. His blood alcohol level was above 200 on presentation. Patient has refused to engage in a more detailed interview with me today. He says that he just wants to be left alone and might talk some more later. Presumably he has been back to drinking most likely drinking  regularly. Benzodiazepines in the drug screen possibly could be from Valium more from medicine he was given for agitation. Since coming into the emergency room he has mostly been calm and been sleeping. Not engaging in any violence currently.  Social history: Lives with his wife. He is a retired English as a second language teacher. Does not work. Has very little to do around the house as near as I can tell.  Substance abuse history: Long-standing problems with alcohol abuse. No history known of seizures or delirium tremens. Multiple presentations with intoxication and agitated behavior while intoxicated. Typically does not abuse other substances.  Medical history: History of high blood pressure history of chronic pain from back disease history of gastric reflux. He has cut himself on the neck multiple times now.  Past Psychiatric History: Patient has a history of depression. Multiple self mutilation episodes on his neck. He tends to be noncompliant with outpatient treatment. He has been vague in the past as to whether medicines were really helpful with his mood. Sometimes feels that they are other times denies it. No history of mania that I can identify no history of psychosis.  Risk to Self: Suicidal Ideation: No-Not Currently/Within Last 6 Months Suicidal Intent: No-Not Currently/Within Last 6 Months Is patient at risk for suicide?: Yes Suicidal Plan?: No Specify Current Suicidal Plan: due to his behavior of cutting his neck, he is in harms way, though he denies suicidal intent Access to Means: No (Currently in the hospital) What has been your use of drugs/alcohol within the last 12 months?: use of alcohol Triggers for Past Attempts: Unpredictable Intentional Self Injurious Behavior: Cutting Risk to Others: Homicidal Ideation: No Thoughts of Harm to Others: No Current Homicidal Intent: No Current  Homicidal Plan: No Access to Homicidal Means: No Identified Victim: None identified History of harm to others?:  No Assessment of Violence: None Noted Violent Behavior Description: verbally aggressive Does patient have access to weapons?: No Criminal Charges Pending?: No Does patient have a court date: No Prior Inpatient Therapy: Prior Inpatient Therapy: Yes Prior Therapy Dates: 2017, 2017 Prior Therapy Facilty/Provider(s): [facility] Reason for Treatment: Depression Prior Outpatient Therapy: Prior Outpatient Therapy: No Prior Therapy Dates: n/a Prior Therapy Facilty/Provider(s): n/a Reason for Treatment: n/a Does patient have an ACCT team?: No Does patient have Intensive In-House Services?  : No Does patient have [facility] services? : No Does patient have P4CC services?: No  Past Medical History:  Past Medical History  Diagnosis Date  . Hypertension 01/02/2015  . DJD (degenerative joint disease)    No past surgical history on file. Family History: No family history on file. Family Psychiatric  History: Positive for depression. Social History:  History  Alcohol Use  . Yes     History  Drug Use No    Social History   Social History  . Marital Status: Married    Spouse Name: N/A  . Number of Children: N/A  . Years of Education: N/A   Social History Main Topics  . Smoking status: Current Every Day Smoker -- 15 years  . Smokeless tobacco: Not on file  . Alcohol Use: Yes  . Drug Use: No  . Sexual Activity: Yes    Birth Control/ Protection: Condom   Other Topics Concern  . Not on file   Social History Narrative   Additional Social History:    Allergies:  No Known Allergies  Labs:  Results for orders placed or performed during the hospital encounter of 11/11/15 (from the past 48 hour(s))  CBC     Status: Abnormal   Collection Time: 11/11/15  4:59 AM  Result Value Ref Range   WBC 8.3 3.8 - 10.6 K/uL   RBC 4.21 (L) 4.40 - 5.90 MIL/uL   Hemoglobin 14.1 13.0 - 18.0 g/dL   HCT 41.0 40.0 - 52.0 %   MCV 97.5 80.0 - 100.0 fL   MCH 33.4 26.0 - 34.0 pg   MCHC 34.3 32.0 - 36.0 g/dL    RDW 13.1 11.5 - 14.5 %   Platelets 153 150 - 440 K/uL  Basic metabolic panel     Status: Abnormal   Collection Time: 11/11/15  4:59 AM  Result Value Ref Range   Sodium 131 (L) 135 - 145 mmol/L   Potassium 3.6 3.5 - 5.1 mmol/L   Chloride 98 (L) 101 - 111 mmol/L   CO2 21 (L) 22 - 32 mmol/L   Glucose, Bld 102 (H) 65 - 99 mg/dL   BUN 7 6 - 20 mg/dL   Creatinine, Ser 1.01 0.61 - 1.24 mg/dL   Calcium 8.3 (L) 8.9 - 10.3 mg/dL   GFR calc non Af Amer >60 >60 mL/min   GFR calc Af Amer >60 >60 mL/min    Comment: (NOTE) The eGFR has been calculated using the CKD EPI equation. This calculation has not been validated in all clinical situations. eGFR's persistently <60 mL/min signify possible Chronic Kidney Disease.    Anion gap 12 5 - 15  Ethanol     Status: Abnormal   Collection Time: 11/11/15  4:59 AM  Result Value Ref Range   Alcohol, Ethyl (B) 246 (H) <5 mg/dL    Comment:        LOWEST DETECTABLE LIMIT FOR  SERUM ALCOHOL IS 5 mg/dL FOR MEDICAL PURPOSES ONLY   Urinalysis complete, with microscopic ([facility] only)     Status: Abnormal   Collection Time: 11/11/15  4:59 AM  Result Value Ref Range   Color, Urine COLORLESS (A) YELLOW   APPearance CLEAR (A) CLEAR   Glucose, UA NEGATIVE NEGATIVE mg/dL   Bilirubin Urine NEGATIVE NEGATIVE   Ketones, ur NEGATIVE NEGATIVE mg/dL   Specific Gravity, Urine 1.002 (L) 1.005 - 1.030   Hgb urine dipstick 1+ (A) NEGATIVE   pH 6.0 5.0 - 8.0   Protein, ur NEGATIVE NEGATIVE mg/dL   Nitrite NEGATIVE NEGATIVE   Leukocytes, UA NEGATIVE NEGATIVE   RBC / HPF NONE SEEN 0 - 5 RBC/hpf   WBC, UA NONE SEEN 0 - 5 WBC/hpf   Bacteria, UA NONE SEEN NONE SEEN   Squamous Epithelial / LPF NONE SEEN NONE SEEN  Urine Drug Screen, Qualitative ([facility] only)     Status: Abnormal   Collection Time: 11/11/15  4:59 AM  Result Value Ref Range   Tricyclic, Ur Screen NONE DETECTED NONE DETECTED   Amphetamines, Ur Screen NONE DETECTED NONE DETECTED   MDMA (Ecstasy)Ur Screen  NONE DETECTED NONE DETECTED   Cocaine Metabolite,Ur [place] NONE DETECTED NONE DETECTED   Opiate, Ur Screen NONE DETECTED NONE DETECTED   Phencyclidine (PCP) Ur S NONE DETECTED NONE DETECTED   Cannabinoid 50 Ng, Ur  NONE DETECTED NONE DETECTED   Barbiturates, Ur Screen NONE DETECTED NONE DETECTED   Benzodiazepine, Ur Scrn POSITIVE (A) NONE DETECTED   Methadone Scn, Ur NONE DETECTED NONE DETECTED    Comment: (NOTE) 010  Tricyclics, urine               Cutoff 1000 ng/mL 200  Amphetamines, urine             Cutoff 1000 ng/mL 300  MDMA (Ecstasy), urine           Cutoff 500 ng/mL 400  Cocaine Metabolite, urine       Cutoff 300 ng/mL 500  Opiate, urine                   Cutoff 300 ng/mL 600  Phencyclidine (PCP), urine      Cutoff 25 ng/mL 700  Cannabinoid, urine              Cutoff 50 ng/mL 800  Barbiturates, urine             Cutoff 200 ng/mL 900  Benzodiazepine, urine           Cutoff 200 ng/mL 1000 Methadone, urine                Cutoff 300 ng/mL 1100 1200 The urine drug screen provides only a preliminary, unconfirmed 1300 analytical test result and should not be used for non-medical 1400 purposes. Clinical consideration and professional judgment should 1500 be applied to any positive drug screen result due to possible 1600 interfering substances. A more specific alternate chemical method 1700 must be used in order to obtain a confirmed analytical result.  1800 Gas chromato graphy / mass spectrometry (GC/MS) is the preferred 1900 confirmatory method.   Acetaminophen level     Status: Abnormal   Collection Time: 11/11/15  4:59 AM  Result Value Ref Range   Acetaminophen (Tylenol), Serum <10 (L) 10 - 30 ug/mL    Comment:        THERAPEUTIC CONCENTRATIONS VARY SIGNIFICANTLY. A RANGE OF 10-30 ug/mL MAY BE AN EFFECTIVE CONCENTRATION  FOR MANY PATIENTS. HOWEVER, SOME ARE BEST TREATED AT CONCENTRATIONS OUTSIDE THIS RANGE. ACETAMINOPHEN CONCENTRATIONS >150 ug/mL AT 4 HOURS AFTER INGESTION  AND >50 ug/mL AT 12 HOURS AFTER INGESTION ARE OFTEN ASSOCIATED WITH TOXIC REACTIONS.   Salicylate level     Status: None   Collection Time: 11/11/15  4:59 AM  Result Value Ref Range   Salicylate Lvl <4.0 2.8 - 30.0 mg/dL    No current facility-administered medications for this encounter.   Current Outpatient Prescriptions  Medication Sig Dispense Refill  . buPROPion (WELLBUTRIN XL) 150 MG 24 hr tablet Take 1 tablet (150 mg total) by mouth daily. 30 tablet 0  . gabapentin (NEURONTIN) 400 MG capsule Take 1 capsule (400 mg total) by mouth 3 (three) times daily. 90 capsule 0  . lidocaine (LIDODERM) 5 % Place 1 patch onto the skin daily. Remove & Discard patch within 12 hours or as directed by MD 30 patch 0  . lidocaine (LIDODERM) 5 % Place 1 patch onto the skin every 12 (twelve) hours. Remove & Discard patch within 12 hours or as directed by MD 10 patch 0  . meloxicam (MOBIC) 7.5 MG tablet Take 1 tablet (7.5 mg total) by mouth 2 (two) times daily before a meal. 60 tablet 0  . pantoprazole (PROTONIX) 40 MG tablet Take 1 tablet (40 mg total) by mouth daily. 30 tablet 0  . traZODone (DESYREL) 100 MG tablet Take 1 tablet (100 mg total) by mouth at bedtime. 30 tablet 0    Musculoskeletal: Strength & Muscle Tone: decreased Gait & Station: unable to stand Patient leans: N/A  Psychiatric Specialty Exam: Review of Systems  Unable to perform ROS: patient unresponsive    Blood pressure 123/82, pulse 83, temperature 97.7 F (36.5 C), temperature source Oral, resp. rate 18, height '5\' 10"'  (1.778 m), weight 95.255 kg (210 lb), SpO2 95 %.Body mass index is 30.13 kg/(m^2).  General Appearance: Fairly Groomed  Engineer, water::  None  Speech:  Slow  Volume:  Decreased  Mood:  Dysphoric  Affect:  Flat  Thought Process:  Goal Directed  Orientation:  Full (Time, Place, and Person)  Thought Content:  Negative  Suicidal Thoughts:  Yes.  with intent/plan  Homicidal Thoughts:  No  Memory:  Immediate;    Negative Recent;   Negative Remote;   Negative  Judgement:  Impaired  Insight:  Shallow  Psychomotor Activity:  Psychomotor Retardation  Concentration:  Poor  Recall:  Poor  Fund of Knowledge:Poor  Language: Poor  Akathisia:  Negative  Handed:  Right  AIMS (if indicated):     Assets:  Financial Resources/Insurance Housing Social Support  ADL's:  Impaired  Cognition: Impaired,  Mild  Sleep:      Treatment Plan Summary: Daily contact with patient to assess and evaluate symptoms and progress in treatment, Medication management and Plan 55 year old man with history of alcohol abuse substance-induced mood disorder possible major depression multiple hospitalizations. He was just discharged from the hospital about 10 days ago after an admission for exactly the same thing. Clearly he is not able currently to follow through on staying sober outside the hospital. His behavior is obviously extremely dangerous. I think at this point it may be appropriate for [place] to try to escalate what we do for treatment. I have discussed this with TTS and I'm recommending that we contact the [facility] first and if they are on diversion contact the alcohol and drug abuse treatment center about possible referral for longer-term substance abuse treatment.  If that is not possible we will have to make a further decision in the future. For now detox protocol. Unlikely to benefit from admission to our psychiatric unit as he has not benefited from the multiple prior admissions he has had. Patient is insisting to me today that his medicines that he was taking were bad for him because they cause some kind of reaction that made him swell up. He cannot tell which medicine it is. As far as I can tell the only medicine that is even relatively new that he was taking might of been the Wellbutrin or possibly meloxicam although he had taken both of these in the past. In any case for now we will discontinue all of his outpatient medicine  except for detox medicines while he is withdrawing and not interactive.  Disposition: Supportive therapy provided about ongoing stressors.  Alethia Berthold, MD 11/11/2015 1:16 PM

## 2015-11-11 NOTE — ED Notes (Addendum)

## 2015-11-11 NOTE — ED Notes (Signed)
ED BHU [place] Is the patient under IVC or is there intent for IVC: Yes.   Is the patient medically cleared: Yes.   Is there vacancy in the ED BHU: Yes.   Is the population mix appropriate for patient: Yes.   Is the patient awaiting placement in inpatient or outpatient setting:    Has the patient had a psychiatric consult:  Consult pending    Survey of unit performed for contraband, proper placement and condition of furniture, tampering with fixtures in bathroom, shower, and each patient room: Yes.  ; Findings:  APPEARANCE/BEHAVIOR Calm and cooperative NEURO ASSESSMENT Orientation: oriented x4  Denies pain Hallucinations: No.None noted (Hallucinations) Speech: Normal Gait: normal RESPIRATORY ASSESSMENT Even  Unlabored respirations  CARDIOVASCULAR ASSESSMENT Pulses equal   regular rate  Skin warm and dry   GASTROINTESTINAL ASSESSMENT no GI complaint EXTREMITIES Full ROM  PLAN OF CARE Provide calm/safe environment. Vital signs assessed twice daily. ED BHU Assessment once each 12-hour shift. Collaborate with intake RN daily or as condition indicates. Assure the ED provider has rounded once each shift. Provide and encourage hygiene. Provide redirection as needed. Assess for escalating behavior; address immediately and inform ED provider.  Assess family dynamic and appropriateness for visitation as needed: Yes.  ; If necessary, describe findings:  Educate the patient/family about BHU procedures/visitation: Yes.  ; If necessary, describe findings:
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
## 2015-11-11 NOTE — ED Notes (Signed)
Pt in with co "medicines swolling my body up".  Pt with laceration to throat states "I just cut it to see blood drip down".  Denies gesture as a suicide attempt at this time.

## 2015-11-11 NOTE — ED Notes (Signed)
Pt allowed RN to obtain V/S. Pt given a drink. Pt had no complaints of HA or nausea. No tremor or sweating noted by RN. Pt wished to go back to sleep. Maintained on 15 minute checks and observation by security camera for safety.

## 2015-11-11 NOTE — ED Notes (Signed)
Pt. Alert and oriented, warm and dry, in no distress. Pt. Denies SI, HI, and AVH. Pt neck abrasions are covered with a dressing. No drainage noted during visual assessment. Pt. Encouraged to let nursing staff know of any concerns or needs.

## 2015-11-11 NOTE — ED Notes (Signed)

## 2015-11-11 NOTE — ED Notes (Signed)
ED BHU [place] Is the patient under IVC or is there intent for IVC: Yes.   Is the patient medically cleared: Yes.   Is there vacancy in the ED BHU: Yes.   Is the population mix appropriate for patient: Yes.   Is the patient awaiting placement in inpatient or outpatient setting: No. Has the patient had a psychiatric consult: Yes.   Survey of unit performed for contraband, proper placement and condition of furniture, tampering with fixtures in bathroom, shower, and each patient room: Yes.  ; Findings: n/a APPEARANCE/BEHAVIOR calm and cooperative NEURO ASSESSMENT Orientation: time, place and person Hallucinations: No.None noted (Hallucinations) Speech: Normal Gait: normal RESPIRATORY ASSESSMENT Normal expansion.  Clear to auscultation.  No rales, rhonchi, or wheezing. CARDIOVASCULAR ASSESSMENT regular rate and rhythm, S1, S2 normal, no murmur, click, rub or gallop GASTROINTESTINAL ASSESSMENT soft, nontender, BS WNL, no r/g EXTREMITIES normal strength, tone, and muscle mass PLAN OF CARE Provide calm/safe environment. Vital signs assessed twice daily. ED BHU Assessment once each 12-hour shift. Collaborate with intake RN daily or as condition indicates. Assure the ED provider has rounded once each shift. Provide and encourage hygiene. Provide redirection as needed. Assess for escalating behavior; address immediately and inform ED provider.  Assess family dynamic and appropriateness for visitation as needed: Yes.  ; If necessary, describe findings: n/a Educate the patient/family about BHU procedures/visitation: Yes.  ; If necessary, describe findings: n/a

## 2015-11-11 NOTE — ED Notes (Signed)
Report given to Noland Pizano B RN - pt to transfer at this time

## 2015-11-11 NOTE — BH Assessment (Signed)
[facility] ([place] [id number]) are currently on diversion with their mental health beds.

## 2015-11-11 NOTE — ED Notes (Signed)
Patient observed lying in bed with eyes closed  Even, unlabored respirations observed   NAD pt appears to be sleeping  I will continue to monitor along with every 15 minute visual observations and ongoing security monitoring    

## 2015-11-12 NOTE — ED Notes (Signed)
Patient asleep in room. No noted distress or abnormal behavior. Will continue 15 minute checks and observation by security cameras for safety. 

## 2015-11-12 NOTE — Progress Notes (Addendum)
Writer has been contacted by Ander Slade and informed the pts referral is incomplete. Writer has faxed the requested information to [phone]; The pts IVC documentation, Authorization #, SS# and Clinical Screening Page.   Writer spoke with representative Juliann Pulse @ [phone] to confirm that updated information has been received.   11/12/2015 Con Memos, [place], [place], [facility] Therapeutic Triage Specialist

## 2015-11-12 NOTE — Consult Note (Signed)
  Psychiatry: Patient evaluated. Chart reviewed. Labs reviewed. Patient is now denying any suicidal ideation. Vital signs stable. Not having acute alcohol withdrawal. Patient is not able to offer any greater insight or assurance that would make [place] think that this kind of behavior is under better control. I pointed out to him that this is multiple times he is presented to the emergency room having cut himself on the neck while intoxicated and the problem just seems to be getting worse. I am going to continue IVC at this point and we are working on possible referral to inpatient substance abuse treatment at the alcohol and drug abuse treatment center because the [place] is on diversion.

## 2015-11-12 NOTE — ED Provider Notes (Signed)
-----------------------------------------   7:16 AM on 11/12/2015 -----------------------------------------   Blood pressure 124/90, pulse 83, temperature 98.2 F (36.8 C), temperature source Oral, resp. rate 16, height 5\' 10"  (1.778 m), weight 210 lb (95.255 kg), SpO2 99 %.  The patient had no acute events since last update.  Calm and cooperative at this time.  Disposition is pending per Psychiatry/Behavioral Medicine team recommendations.     Daymon Larsen, MD 11/12/15 [phone]

## 2015-11-12 NOTE — BHH Counselor (Addendum)
Regional Referral form submitted to [facility] for Authorization and to [place] for review.  Tracking # [id number]

## 2015-11-12 NOTE — ED Notes (Signed)
Patient awake, alert, and oriented. He denies SI.  Patient is requesting to go home. He is aware that he is under IVC but states his drinking and cutting is "his business."  Patient is not experiencing any s/s ETOH withdrawal. Maintained on 15 minute checks and observation by security camera for safety.

## 2015-11-12 NOTE — ED Notes (Signed)
Patient resting quietly in room. No noted distress or abnormal behaviors noted. Will continue 15 minute checks and observation by security camera for safety. 

## 2015-11-12 NOTE — ED Notes (Signed)
Patient sitting quietly in dayroom. Maintained on all safety checks.

## 2015-11-12 NOTE — ED Notes (Signed)
Patient was informed that his wife called.  Patient has been resting in bed, in no apparent distress. Disposition is pending. Maintained on all safety checks.

## 2015-11-12 NOTE — ED Notes (Signed)
Patient concerned about possible transfer to another treatment facility. He says that even if he goes, he will have to be discharged by 5/28 secondary to planned vacation. Appears to have little insight into consequences of his drinking and self mutilation behaviors. Laceration to neck appears dry, no s/s infection. Steri strips intact.  Maintained on 15 minute checks and observation by security camera for safety.

## 2015-11-12 NOTE — ED Notes (Signed)

## 2015-11-12 NOTE — ED Notes (Addendum)
Pt. Alert and oriented, warm and dry, in no distress. Pt. Denies SI, HI, and AVH. Pt denies any withdraw symptoms at time of assessment. Pt. Encouraged to let nursing staff know of any concerns or needs.

## 2015-11-12 NOTE — ED Notes (Signed)

## 2015-11-13 NOTE — ED Notes (Signed)
Nurse contacted patient's wife April Decarlo [phone]) and informed her that patient's medications had been located and that she could pick them up from the hospital at her earliest convenience.  Wife stated that she would pick them up at some point today.

## 2015-11-13 NOTE — ED Notes (Signed)
Patient currently denies SI/HI/AVH and pain.  Patient states that his medication bottles are missing from belongings but also states its a possibility that they had gotten lost before admission. All other belongings returned and patient signed belongings inventory. Admission reviewed with patient, patient agreeable to plan to be transported to [facility] via police escort.

## 2015-11-13 NOTE — BH Assessment (Signed)
Referral information faxed to hospitals who offer dual diagnosis treatment;   [place]   [facility] [phone]

## 2015-11-13 NOTE — ED Notes (Signed)
Patient is currently in the shower. No signs of acute distress noted. Maintained on 15 minute checks and observation by security camera for safety.

## 2015-11-13 NOTE — BH Assessment (Signed)
Patient has been accepted to [facility].  Accepting physician is Dr. Selinda Flavin.  Call report to [phone].  Representative was [employer].  ER Staff is aware of it Lattie Haw, ER Sect.; Dr. Marcelene Butte, [place], Patient's Nurse)

## 2015-11-13 NOTE — ED Notes (Signed)
ENVIRONMENTAL ASSESSMENT Potentially harmful objects out of patient reach: Yes Personal belongings secured: Yes Patient dressed in hospital provided attire only: Yes Plastic bags out of patient reach: Yes Patient care equipment (cords, cables, call bells, lines, and drains) shortened, removed, or accounted for: Yes Equipment and supplies removed from bottom of stretcher: Yes Potentially toxic materials out of patient reach: Yes Sharps container removed or out of patient reach: Yes  Patient currently in room resting. No signs of distress noted at this time. Maintained on 15 minute checks and observation by security camera for safety.

## 2015-11-13 NOTE — ED Notes (Signed)
Patient is awake and is currently talking on the phone. No signs of distress noted at this time. Maintained on 15 minute checks and observation by security camera for safety.

## 2015-11-13 NOTE — ED Notes (Signed)
Patient continues to watch television in the dayroom. No signs of acute distress noted. Maintained on 15 minute checks and observation by security camera for safety.

## 2015-11-13 NOTE — ED Notes (Signed)
Patient asleep in room. No noted distress or abnormal behavior. Will continue 15 minute checks and observation by security cameras for safety. 

## 2015-11-13 NOTE — ED Notes (Signed)
Patient currently in the dayroom watching television. Patient is calm and cooperative with no signs of acute distress noted. Maintained on 15 minute checks and observation by security camera for safety.

## 2015-11-13 NOTE — ED Provider Notes (Signed)
-----------------------------------------   6:32 AM on 11/13/2015 -----------------------------------------   Blood pressure 133/92, pulse 66, temperature 98.3 F (36.8 C), temperature source Oral, resp. rate 18, height 5\' 10"  (1.778 m), weight 210 lb (95.255 kg), SpO2 98 %.  The patient had no acute events since last update.  Calm and cooperative at this time.  Disposition is pending per Psychiatry/Behavioral Medicine team recommendations.     Carrie Mew, MD 11/13/15 [phone]

## 2015-11-13 NOTE — ED Notes (Signed)
Patient was informed that he would be transported to [facility]. Patient is agreeable with this plan. Nurse assessed wounds on neck, wound covered with steri strips. No current signs of infection, wound is clean dry and intact. Maintained on 15 minute checks and observation by security camera for safety.

## 2015-11-13 NOTE — ED Notes (Signed)
Patient currently denies SI/HI/AVH. Patient states that he has pain rated a 7/10 in his back due to injury in January but he refuses pain medication at this time. Patient has an irritable mood but is cooperative with care. He is anxious to get placement soon and asked some questions about location and programming which nurse answered. No signs of acute distress at this time. Maintained on 15 minute checks and observation by security camera for safety.

## 2015-11-13 NOTE — BH Assessment (Signed)
Writer called and left a HIPPA Compliant message with [facility] ([phone]), requesting a return phone call.

## 2015-11-13 NOTE — Consult Note (Signed)
  Psychiatry: Follow-up for Friday the 12th. Patient is physically stable. Vital signs stable. Not showing any signs of delirium or tremor. Mood is stated as improved although affect remains blunted. Patient is denying any suicidal intent. Does not appear to be psychotic.  Arrangements have been made for transfer to [facility] for substance abuse treatment and further evaluation and treatment of suicidality. Patient remains under IVC for now. No change to current medicine. Transfer is anticipated later this evening

## 2015-12-08 IMAGING — CT CT HEAD WITHOUT CONTRAST
4 series · 18 of 30 positions shown, 19 images · non-contrast
Comparison: CT of the head performed 01/01/2011

CLINICAL DATA: Overdose.  Lethargy.

EXAM:
CT HEAD WITHOUT CONTRAST
TECHNIQUE: Contiguous axial images were obtained from the base of the skull
through the vertex without intravenous contrast.

[Series 2: head bone · axial · 0.44mm/px · z∈[+1121,+1275]mm · 8 of 93 slices shown]
[im 8/93  bone]
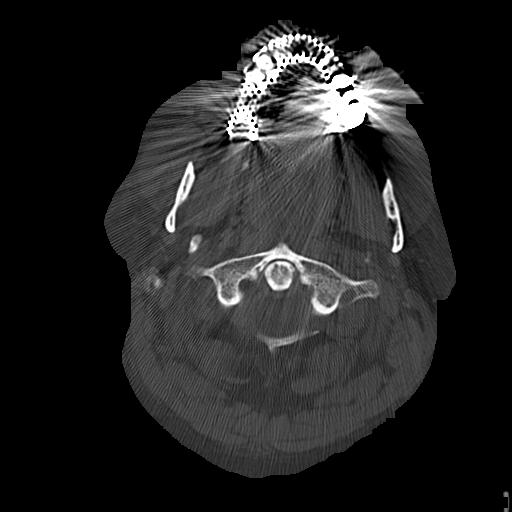
[im 24/93  bone]
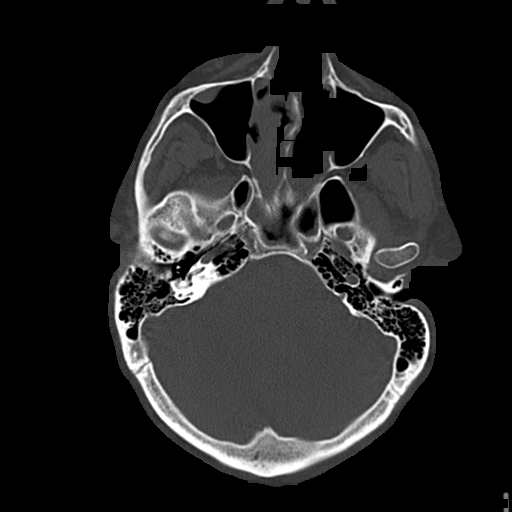
[im 31/93  bone]
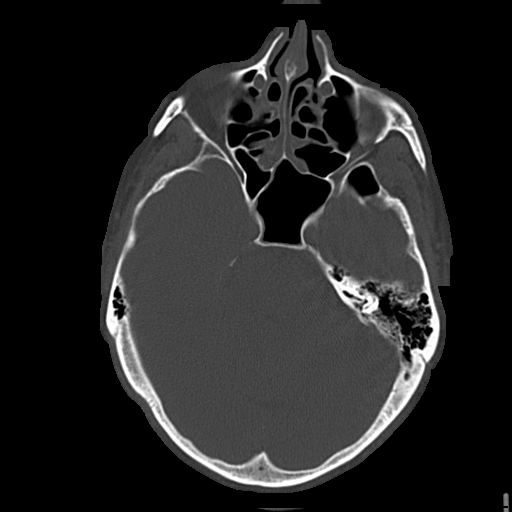
[im 39/93  bone]
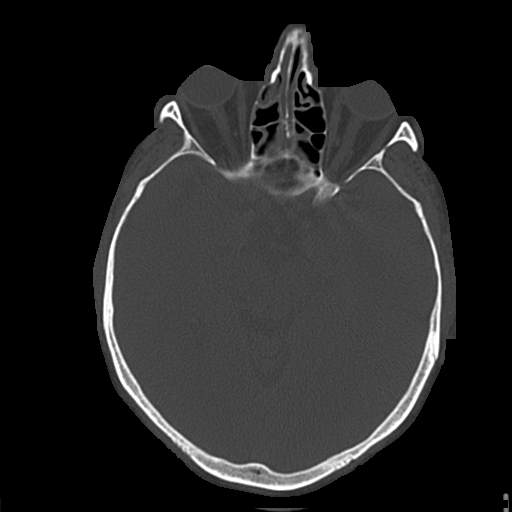
[im 54/93  bone]
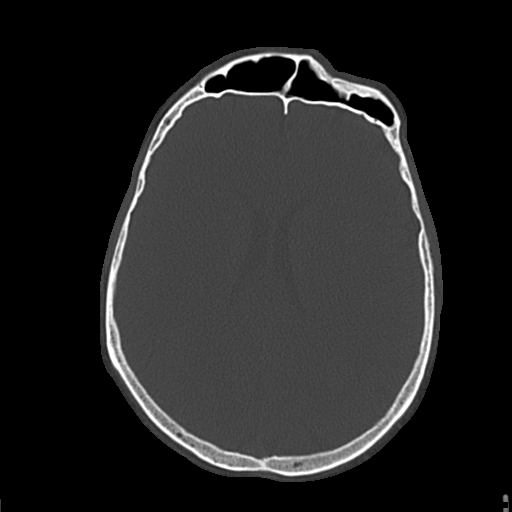
[im 62/93  bone]
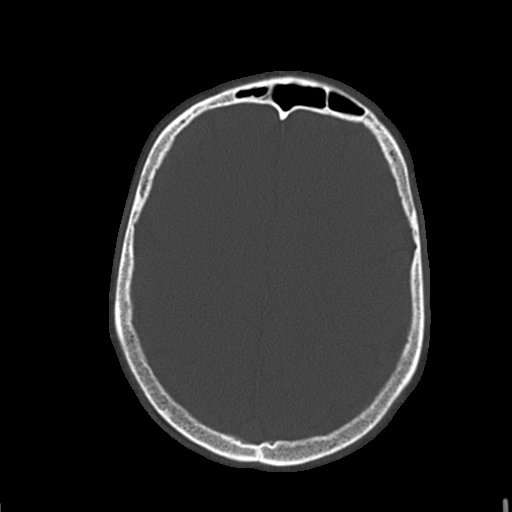
[im 70/93  bone]
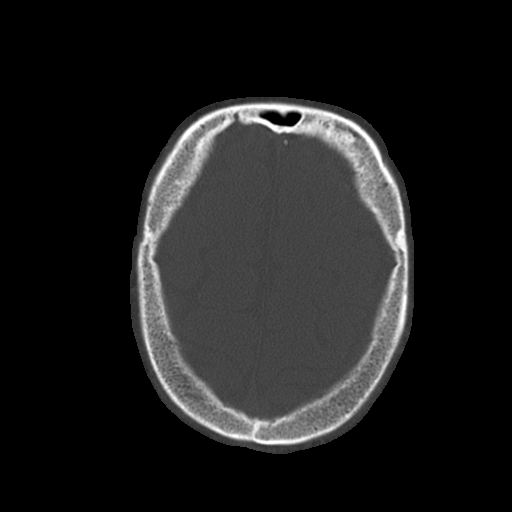
[im 85/93  bone]
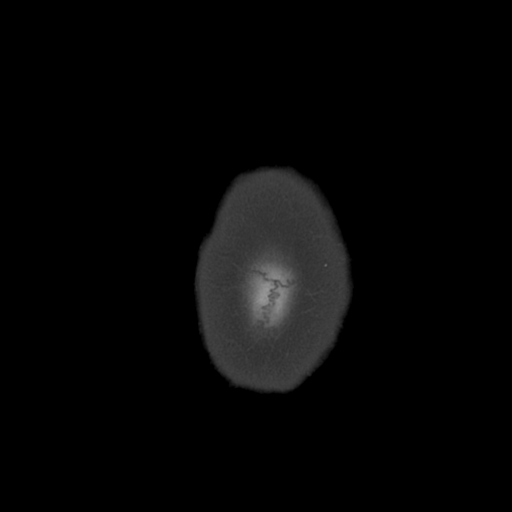

[Series 3: head wo · axial · 0.44mm/px · z∈[+1172,+1226]mm · 2 of 34 slices shown, 3 images]
[im 12/34  brain]
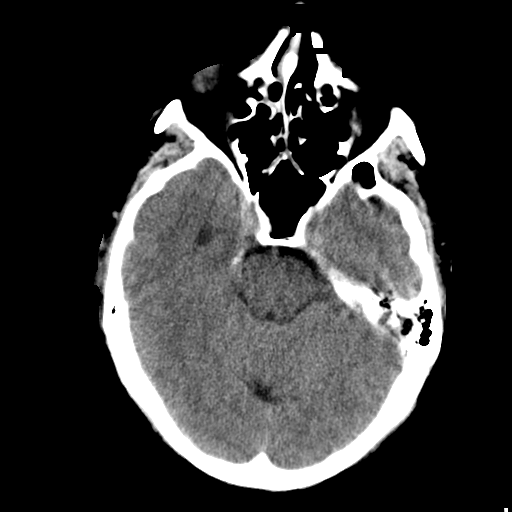
[im 12/34  bone]
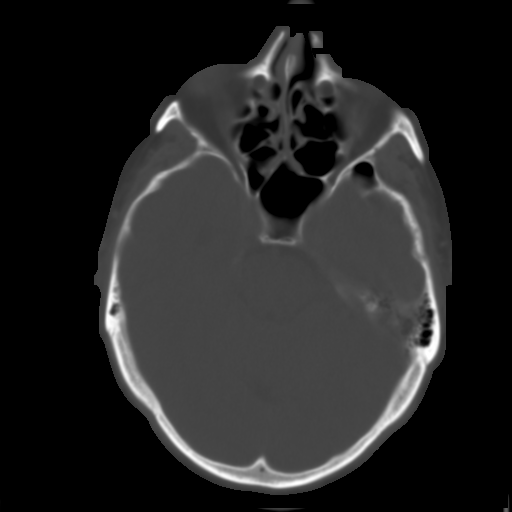
[im 23/34  brain]
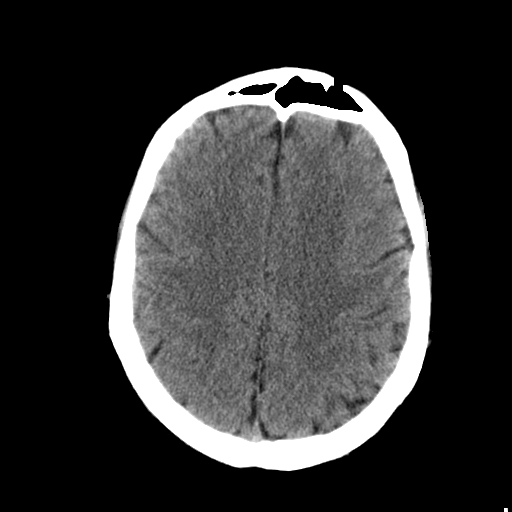

[Series 4: head wo recons · axial · 0.44mm/px · z∈[+1246,+1282]mm · 2 of 29 slices shown]
[im 10/29  brain]
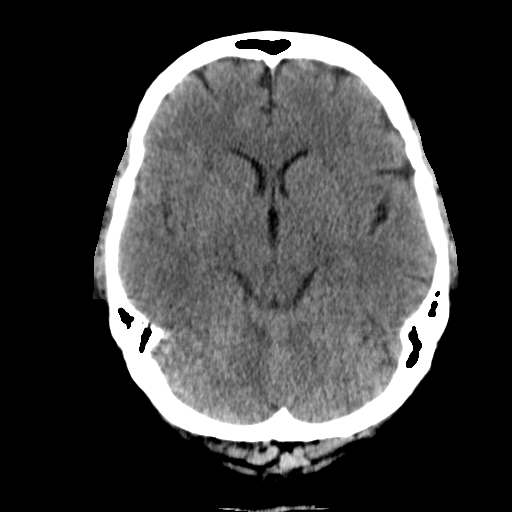
[im 19/29  brain]
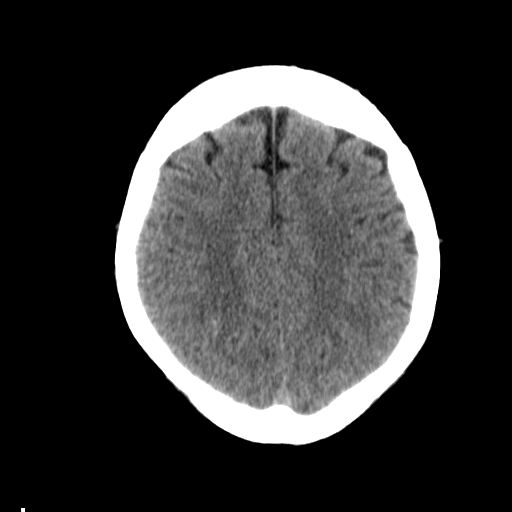

[Series 5: head bone recons · axial · 0.44mm/px · z∈[+1222,+1296]mm · 6 of 72 slices shown]
[im 9/72  bone]
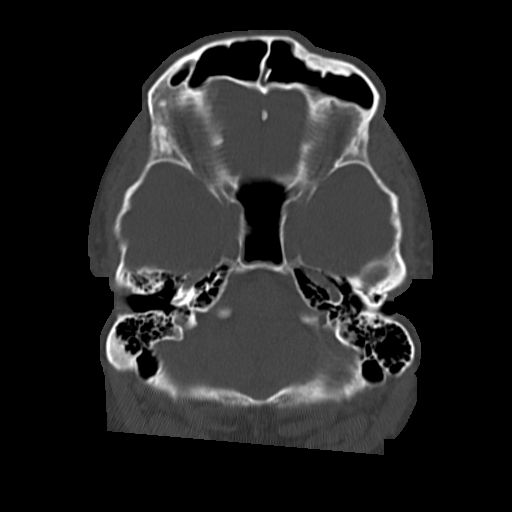
[im 18/72  bone]
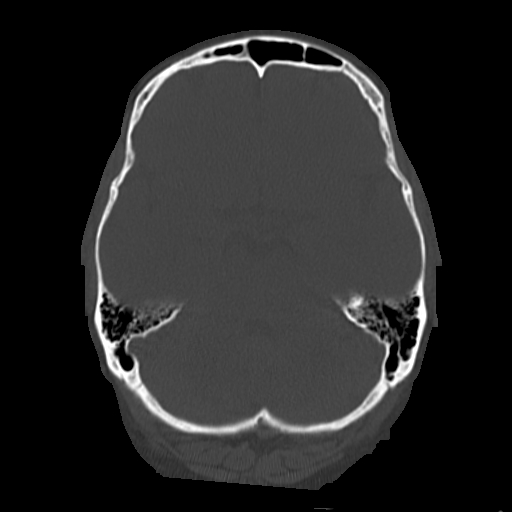
[im 27/72  bone]
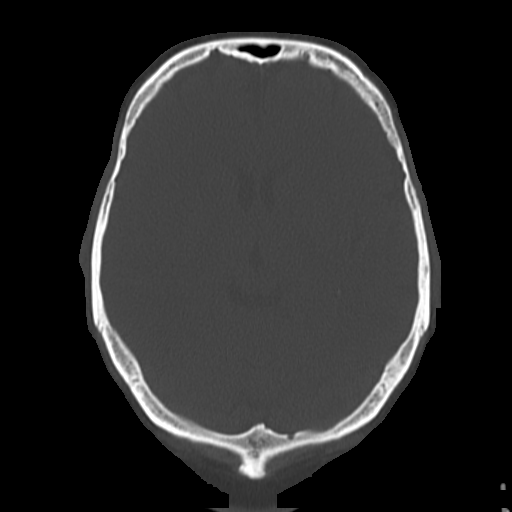
[im 36/72  bone]
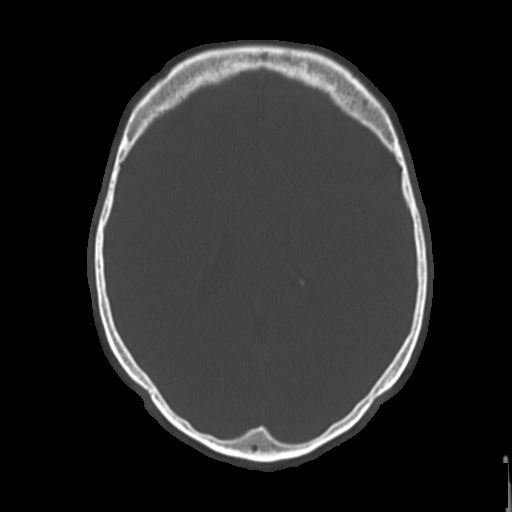
[im 45/72  bone]
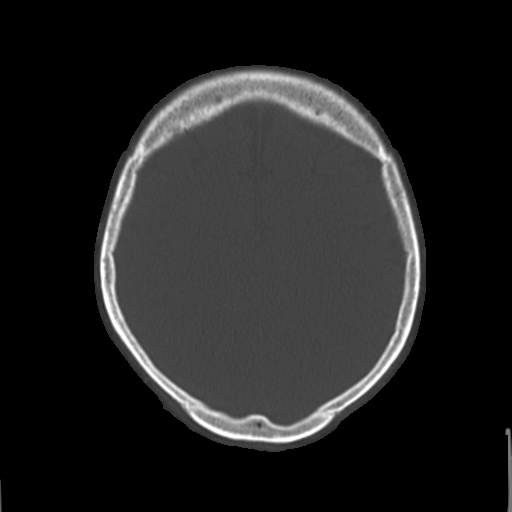
[im 54/72  bone]
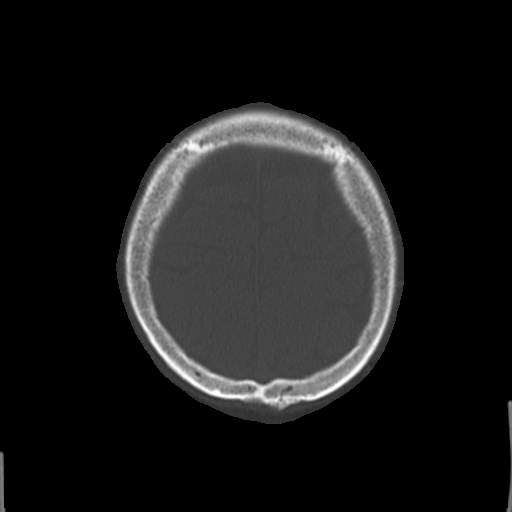

[18 of 30 positions shown; findings below may reference images not displayed]

FINDINGS: There is no evidence of acute infarction, mass lesion, or intra- or
extra-axial hemorrhage on CT.

Minimal periventricular white matter change likely reflects small
vessel ischemic microangiopathy.

The posterior fossa, including the cerebellum, brainstem and fourth
ventricle, is within normal limits. The third and lateral
ventricles, and basal ganglia are unremarkable in appearance. The
cerebral hemispheres are symmetric in appearance, with normal
gray-white differentiation. No mass effect or midline shift is seen.

There is no evidence of fracture; an apparent stable calcified
cm meningioma is noted at the vertex. There is mild chronic
deformity involving the outer table of the left frontal calvarium.
The orbits are within normal limits. The paranasal sinuses and
mastoid air cells are well-aerated. No significant soft tissue
abnormalities are seen.
IMPRESSION: 1. No acute intracranial pathology seen on CT.
2. Mild small vessel ischemic microangiopathy.
3. Apparent stable calcified 1.3 cm meningioma incidentally noted at
the vertex.

## 2017-09-19 ENCOUNTER — Emergency Department
Admission: EM | Admit: 2017-09-19 | Discharge: 2017-09-19 | Disposition: A | Discharge status: Home / Self Care | Attending: Emergency Medicine | Admitting: Emergency Medicine

## 2017-09-19 ENCOUNTER — Other Ambulatory Visit: Payer: Self-pay

## 2017-09-19 ENCOUNTER — Encounter: Payer: Self-pay | Admitting: Emergency Medicine

## 2017-09-19 DIAGNOSIS — F1010 Alcohol abuse, uncomplicated: Secondary | ICD-10-CM | POA: Insufficient documentation

## 2017-09-19 DIAGNOSIS — K0889 Other specified disorders of teeth and supporting structures: Secondary | ICD-10-CM | POA: Diagnosis present

## 2017-09-19 DIAGNOSIS — I10 Essential (primary) hypertension: Secondary | ICD-10-CM | POA: Insufficient documentation

## 2017-09-19 DIAGNOSIS — Z79899 Other long term (current) drug therapy: Secondary | ICD-10-CM | POA: Insufficient documentation

## 2017-09-19 DIAGNOSIS — F17200 Nicotine dependence, unspecified, uncomplicated: Secondary | ICD-10-CM | POA: Insufficient documentation

## 2017-09-19 LAB — URINE DRUG SCREEN, QUALITATIVE (ARMC ONLY)
Amphetamines, Ur Screen: NOT DETECTED
BARBITURATES, UR SCREEN: NOT DETECTED
Benzodiazepine, Ur Scrn: NOT DETECTED
CANNABINOID 50 NG, UR ~~LOC~~: NOT DETECTED
COCAINE METABOLITE, UR ~~LOC~~: NOT DETECTED
MDMA (Ecstasy)Ur Screen: NOT DETECTED
Methadone Scn, Ur: NOT DETECTED
OPIATE, UR SCREEN: NOT DETECTED
PHENCYCLIDINE (PCP) UR S: NOT DETECTED
TRICYCLIC, UR SCREEN: NOT DETECTED

## 2017-09-19 NOTE — Discharge Instructions (Addendum)
Please follow-up with the dentist tomorrow for a recheck.  Return to the emergency department sooner for any concerns.  It was a pleasure to take care of you today, and thank you for coming to our emergency department.  If you have any questions or concerns before leaving please ask the nurse to grab me and I'm more than happy to go through your aftercare instructions again.  If you were prescribed any opioid pain medication today such as Norco, Vicodin, Percocet, morphine, hydrocodone, or oxycodone please make sure you do not drive when you are taking this medication as it can alter your ability to drive safely.  If you have any concerns once you are home that you are not improving or are in fact getting worse before you can make it to your follow-up appointment, please do not hesitate to call [phone] and come back for further evaluation.  Darel Hong, MD  Results for orders placed or performed during the hospital encounter of 09/19/17  Urine Drug Screen, Qualitative  Result Value Ref Range   Tricyclic, Ur Screen NONE DETECTED NONE DETECTED   Amphetamines, Ur Screen NONE DETECTED NONE DETECTED   MDMA (Ecstasy)Ur Screen NONE DETECTED NONE DETECTED   Cocaine Metabolite,Ur [place] NONE DETECTED NONE DETECTED   Opiate, Ur Screen NONE DETECTED NONE DETECTED   Phencyclidine (PCP) Ur S NONE DETECTED NONE DETECTED   Cannabinoid [address], Ur [place] NONE DETECTED NONE DETECTED   Barbiturates, Ur Screen NONE DETECTED NONE DETECTED   Benzodiazepine, Ur Scrn NONE DETECTED NONE DETECTED   Methadone Scn, Ur NONE DETECTED NONE DETECTED

## 2017-09-19 NOTE — ED Triage Notes (Signed)
Pt to ED via [facility] from home for tooth pain and alcohol intoxication. Pt states that he drank a 12 pack of beer between last night and this morning. Pt states that he has been having tooth pain for the past year. Pt states that the pain got worse and that he cannot take it any more. Pt smells of alcohol.

## 2017-09-19 NOTE — ED Notes (Signed)
Patient resting at this time.

## 2017-09-19 NOTE — ED Notes (Signed)
Assisted patient to the restroom to urinate. Patient unsteady when standing.

## 2017-09-19 NOTE — ED Notes (Signed)
Spoke with patient's wife, Sharyn Lull, who states she is at work and unable to leave to pick patient up. States she does not know anyone else who may be available. MD and charge nurse aware.

## 2017-09-19 NOTE — ED Provider Notes (Signed)
[facility] Emergency Department Provider Note  ____________________________________________   I have reviewed the triage vital signs and the nursing notes. Where available I have reviewed prior notes and, if possible and indicated, outside hospital notes.    HISTORY  Chief Complaint Alcohol Intoxication and Dental Pain    HPI Jesse Obrien is a 57 y.o. male 3 DJD hypertension, he states he has a toothache and he also states "I know I am drunk". Denies trauma, did not fall he states he denies fever or chills able to eat and drink, his tooth has been hurting him for a year or longer.  He does not endorse SI or HI at this time   level 5 chart caveat; no further history available due to patient status.   Past Medical History:  Diagnosis Date  . DJD (degenerative joint disease)   . Hypertension 01/02/2015    Patient Active Problem List   Diagnosis Date Noted  . Suicidal behavior 10/28/2015  . Noncompliance 10/28/2015  . Tobacco use disorder 10/28/2015  . Substance induced mood disorder ([place]) 10/28/2015  . GERD (gastroesophageal reflux disease) 10/28/2015  . Alcohol use disorder, severe, dependence ([place] [address]) 03/20/2015  . DDD (degenerative disc disease), lumbar 03/20/2015  . Osteoarthrosis, unspecified whether generalized or localized, involving lower leg 03/20/2015  . Spinal stenosis of lumbar region 03/20/2015  . Major depressive disorder, recurrent, severe with psychotic features ([place]) 03/19/2015  . Self inflicted neck laceration 01/02/2015  . Hypertension 01/02/2015    History reviewed. No pertinent surgical history.  Prior to Admission medications   Medication Sig Start Date End Date Taking? Authorizing Provider  buPROPion (WELLBUTRIN XL) 150 MG 24 hr tablet Take 1 tablet (150 mg total) by mouth daily. 11/01/15   Clapacs, Madie Reno, MD  gabapentin (NEURONTIN) 400 MG capsule Take 1 capsule (400 mg total) by mouth 3 (three) times daily. 11/01/15   Clapacs,  Madie Reno, MD  meloxicam (MOBIC) 7.5 MG tablet Take 1 tablet (7.5 mg total) by mouth 2 (two) times daily before a meal. 11/01/15   Clapacs, Madie Reno, MD  pantoprazole (PROTONIX) 40 MG tablet Take 1 tablet (40 mg total) by mouth daily. 11/01/15   Clapacs, Madie Reno, MD  traZODone (DESYREL) 100 MG tablet Take 1 tablet (100 mg total) by mouth at bedtime. 11/01/15   Clapacs, Madie Reno, MD    Allergies Patient has no known allergies.  No family history on file.  Social History Social History   Tobacco Use  . Smoking status: Current Every Day Smoker    Years: 15.00  . Smokeless tobacco: Never Used  Substance Use Topics  . Alcohol use: Yes  . Drug use: No    Review of Systems Constitutional: No fever/chills Eyes: No visual changes. ENT: No sore throat. No stiff neck no neck pain Cardiovascular: Denies chest pain. Respiratory: Denies shortness of breath. Gastrointestinal:   no vomiting.  No diarrhea.  No constipation. Genitourinary: Negative for dysuria. Musculoskeletal: Negative lower extremity swelling Skin: Negative for rash. Neurological: Negative for severe headaches, focal weakness or numbness.   ____________________________________________   PHYSICAL EXAM:  VITAL SIGNS: ED Triage Vitals  Enc Vitals Group     BP 09/19/17 0728 131/90     Pulse Rate 09/19/17 0728 65     Resp 09/19/17 0728 18     Temp 09/19/17 0728 98.7 F (37.1 C)     Temp src --      SpO2 09/19/17 0728 96 %     Weight  09/19/17 0730 210 lb (95.3 kg)     Height 09/19/17 0730 5\' 11"  (1.803 m)     Head Circumference --      Peak Flow --      Pain Score 09/19/17 0729 7     Pain Loc --      Pain Edu? --      Excl. in [place]? --     Constitutional: Patient is clinically intoxicated smells of alcohol however he is awake and guarding his airway well Eyes: Conjunctivae are normal Head: Atraumatic HEENT: No congestion/rhinnorhea. Mucous membranes are moist.  Oropharynx non-erythematous very poor dentition noted, no  abscess or erythema noted to the gold tooth where he is suffering pain. Neck:   Nontender with no meningismus, no masses, no stridor Cardiovascular: Normal rate, regular rhythm. Grossly normal heart sounds.  Good peripheral circulation. Respiratory: Normal respiratory effort.  No retractions. Lungs CTAB. Abdominal: Soft and nontender. No distention. No guarding no rebound Back:  There is no focal tenderness or step off.  there is no midline tenderness there are no lesions noted. there is no CVA tenderness Musculoskeletal: No lower extremity tenderness, no upper extremity tenderness. No joint effusions, no DVT signs strong distal pulses no edema Neurologic:  Normal speech and language. No gross focal neurologic deficits are appreciated.  Skin:  Skin is warm, dry and intact. No rash noted. Psychiatric: Mood and affect are insistent with intoxication  ____________________________________________   LABS (all labs ordered are listed, but only abnormal results are displayed)  Labs Reviewed  COMPREHENSIVE METABOLIC PANEL  ETHANOL  CBC  URINE DRUG SCREEN, QUALITATIVE ([place])    Pertinent labs  results that were available during my care of the patient were reviewed by me and considered in my medical decision making (see chart for details). ____________________________________________  EKG  I personally interpreted any EKGs ordered by me or triage  ____________________________________________  RADIOLOGY  Pertinent labs & imaging results that were available during my care of the patient were reviewed by me and considered in my medical decision making (see chart for details). If possible, patient and/or family made aware of any abnormal findings.  No results found. ____________________________________________    PROCEDURES  Procedure(s) performed: None  Procedures  Critical Care performed: None  ____________________________________________   INITIAL IMPRESSION / ASSESSMENT  AND PLAN / ED COURSE  Pertinent labs & imaging results that were available during my care of the patient were reviewed by me and considered in my medical decision making (see chart for details).  She is here for what may be dental pain but really is intoxication.  Nothing really a mess in his mouth but I can see.  He has poor dentition which probably does or I suppose.  However, no evidence of abscess or [place] or angioedema or any other significant oral pathology.  Patient does admit to alcohol abuse and we will see how he feels after he sleeps some of that off.  We will keep an eye on him while he does so.    ____________________________________________   FINAL CLINICAL IMPRESSION(S) / ED DIAGNOSES  Final diagnoses:  None      This chart was dictated using voice recognition software.  Despite best efforts to proofread,  errors can occur which can change meaning.      Schuyler Amor, MD 09/19/17 [phone]

## 2017-09-19 NOTE — ED Provider Notes (Signed)
The patient has a ride home.  While he is still intoxicated he is able to ambulate and his friend can take him home.  Will refer to dentistry as an outpatient.   Darel Hong, MD 09/19/17 1051

## 2017-09-19 NOTE — ED Notes (Signed)
Patient refused e signature.

## 2017-09-19 NOTE — ED Notes (Signed)
Patient discharged and was picked up by his friend Jesse Obrien.

## 2018-01-06 ENCOUNTER — Other Ambulatory Visit: Payer: Self-pay

## 2018-01-06 ENCOUNTER — Emergency Department
Admission: EM | Admit: 2018-01-06 | Discharge: 2018-01-07 | Disposition: A | Discharge status: Home / Self Care | Attending: Emergency Medicine | Admitting: Emergency Medicine

## 2018-01-06 ENCOUNTER — Encounter: Payer: Self-pay | Admitting: Emergency Medicine

## 2018-01-06 DIAGNOSIS — IMO0002 Reserved for concepts with insufficient information to code with codable children: Secondary | ICD-10-CM

## 2018-01-06 DIAGNOSIS — F10920 Alcohol use, unspecified with intoxication, uncomplicated: Secondary | ICD-10-CM | POA: Diagnosis not present

## 2018-01-06 DIAGNOSIS — F17200 Nicotine dependence, unspecified, uncomplicated: Secondary | ICD-10-CM | POA: Insufficient documentation

## 2018-01-06 DIAGNOSIS — R45851 Suicidal ideations: Secondary | ICD-10-CM

## 2018-01-06 DIAGNOSIS — F329 Major depressive disorder, single episode, unspecified: Secondary | ICD-10-CM | POA: Diagnosis not present

## 2018-01-06 DIAGNOSIS — I10 Essential (primary) hypertension: Secondary | ICD-10-CM | POA: Diagnosis not present

## 2018-01-06 DIAGNOSIS — Y908 Blood alcohol level of 240 mg/100 ml or more: Secondary | ICD-10-CM | POA: Insufficient documentation

## 2018-01-06 DIAGNOSIS — Z789 Other specified health status: Secondary | ICD-10-CM | POA: Insufficient documentation

## 2018-01-06 DIAGNOSIS — Z915 Personal history of self-harm: Secondary | ICD-10-CM | POA: Diagnosis not present

## 2018-01-06 DIAGNOSIS — Z7289 Other problems related to lifestyle: Secondary | ICD-10-CM

## 2018-01-06 LAB — COMPREHENSIVE METABOLIC PANEL
ALT: 46 U/L — AB (ref 0–44)
AST: 41 U/L (ref 15–41)
Albumin: 4.7 g/dL (ref 3.5–5.0)
Alkaline Phosphatase: 79 U/L (ref 38–126)
Anion gap: 11 (ref 5–15)
BUN: 7 mg/dL (ref 6–20)
CHLORIDE: 108 mmol/L (ref 98–111)
CO2: 20 mmol/L — AB (ref 22–32)
CREATININE: 0.73 mg/dL (ref 0.61–1.24)
Calcium: 8.7 mg/dL — ABNORMAL LOW (ref 8.9–10.3)
GFR calc non Af Amer: >60 mL/min (ref >60)
Glucose, Bld: 105 mg/dL — ABNORMAL HIGH (ref 70–99)
POTASSIUM: 3.7 mmol/L (ref 3.5–5.1)
SODIUM: 139 mmol/L (ref 135–145)
Total Bilirubin: 0.7 mg/dL (ref 0.3–1.2)
Total Protein: 7.9 g/dL (ref 6.5–8.1)

## 2018-01-06 LAB — CBC
HCT: 44.8 % (ref 40.0–52.0)
HEMOGLOBIN: 15.6 g/dL (ref 13.0–18.0)
MCH: 34.1 pg — AB (ref 26.0–34.0)
MCHC: 34.8 g/dL (ref 32.0–36.0)
MCV: 98.2 fL (ref 80.0–100.0)
PLATELETS: 173 10*3/uL (ref 150–440)
RBC: 4.57 MIL/uL (ref 4.40–5.90)
RDW: 13.2 % (ref 11.5–14.5)
WBC: 8.4 10*3/uL (ref 3.8–10.6)

## 2018-01-06 LAB — ACETAMINOPHEN LEVEL

## 2018-01-06 LAB — SALICYLATE LEVEL

## 2018-01-06 LAB — ETHANOL: Alcohol, Ethyl (B): 267 mg/dL — ABNORMAL HIGH (ref <10)

## 2018-01-06 MED ORDER — HALOPERIDOL LACTATE 5 MG/ML IJ SOLN
10.0000 mg | Freq: Once | INTRAMUSCULAR | Status: AC
  Administered 2018-01-06: 10 mg via INTRAMUSCULAR
  Filled 2018-01-06: qty 2

## 2018-01-06 MED ORDER — LORAZEPAM 2 MG PO TABS
2.0000 mg | ORAL_TABLET | Freq: Once | ORAL | Status: DC
  Filled 2018-01-06: qty 1

## 2018-01-06 MED ORDER — LORAZEPAM 2 MG/ML IJ SOLN
2.0000 mg | Freq: Once | INTRAMUSCULAR | Status: AC
  Administered 2018-01-06: 2 mg via INTRAMUSCULAR

## 2018-01-06 MED ORDER — LORAZEPAM 2 MG/ML IJ SOLN
INTRAMUSCULAR | Status: AC
  Administered 2018-01-06: 2 mg via INTRAMUSCULAR
  Filled 2018-01-06: qty 1

## 2018-01-06 NOTE — ED Notes (Signed)

## 2018-01-06 NOTE — ED Provider Notes (Signed)
[facility] Emergency Department Provider Note       Time seen: ----------------------------------------- 4:26 PM on 01/06/2018 -----------------------------------------   I have reviewed the triage vital signs and the nursing notes.  HISTORY   Chief Complaint Suicidal and Fall    HPI Jesse Obrien is a 57 y.o. male with a history of hypertension and degenerative joint disease who presents to the ED for involuntary commitment.  Patient arrives via [facility] department under IVC for SI.  Patient cut his throat with a razor today.  Reportedly he cut his throat with a razor because he is tired of living and tired of living with his wife.  Superficial abrasion was noted prior to arrival.  Past Medical History:  Diagnosis Date  . DJD (degenerative joint disease)   . Hypertension 01/02/2015    Patient Active Problem List   Diagnosis Date Noted  . Suicidal behavior 10/28/2015  . Noncompliance 10/28/2015  . Tobacco use disorder 10/28/2015  . Substance induced mood disorder ([place]) 10/28/2015  . GERD (gastroesophageal reflux disease) 10/28/2015  . Alcohol use disorder, severe, dependence ([place]) 03/20/2015  . DDD (degenerative disc disease), lumbar 03/20/2015  . Osteoarthrosis, unspecified whether generalized or localized, involving lower leg 03/20/2015  . Spinal stenosis of lumbar region 03/20/2015  . Major depressive disorder, recurrent, severe with psychotic features ([place]) 03/19/2015  . Self inflicted neck laceration 01/02/2015  . Hypertension 01/02/2015    History reviewed. No pertinent surgical history.  Allergies Patient has no known allergies.  Social History Social History   Tobacco Use  . Smoking status: Current Every Day Smoker    Years: 15.00  . Smokeless tobacco: Never Used  Substance Use Topics  . Alcohol use: Yes  . Drug use: No   Review of Systems Constitutional: Negative for fever. ENT: Positive for abrasion to his  neck. Cardiovascular: Negative for chest pain. Respiratory: Negative for shortness of breath. Gastrointestinal: Negative for abdominal pain, vomiting and diarrhea. Musculoskeletal: Negative for back pain. Skin: Negative for rash. Neurological: Negative for headaches, focal weakness or numbness.  All systems negative/normal/unremarkable except as stated in the HPI  ____________________________________________   PHYSICAL EXAM:  VITAL SIGNS: ED Triage Vitals  Enc Vitals Group     BP 01/06/18 1534 (!) 142/81     Pulse Rate 01/06/18 1534 (!) 117     Resp 01/06/18 1534 16     Temp 01/06/18 1534 99.3 F (37.4 C)     Temp Source 01/06/18 1534 Oral     SpO2 01/06/18 1534 94 %     Weight 01/06/18 1535 205 lb (93 kg)     Height 01/06/18 1535 5\' 10"  (1.778 m)     Head Circumference --      Peak Flow --      Pain Score 01/06/18 1539 0     Pain Loc --      Pain Edu? --      Excl. in [place]? --    Constitutional: Alert and oriented. Well appearing and in no distress.  Patient appears intoxicated Eyes: Conjunctivae are normal. Normal extraocular movements. ENT   Head: Normocephalic and atraumatic.   Nose: No congestion/rhinnorhea.   Mouth/Throat: Mucous membranes are moist.   Neck: No stridor.  Small anterior superficial laceration is noted that does not require any closure Cardiovascular: Normal rate, regular rhythm. No murmurs, rubs, or gallops. Respiratory: Normal respiratory effort without tachypnea nor retractions. Breath sounds are clear and equal bilaterally. No wheezes/rales/rhonchi. Gastrointestinal: Soft and nontender.  Normal bowel sounds Musculoskeletal: Nontender with normal range of motion in extremities. No lower extremity tenderness nor edema. Neurologic:  Normal speech and language. No gross focal neurologic deficits are appreciated.  Skin: Small anterior laceration of the neck is noted Psychiatric: Mood and affect are normal. Speech and behavior are normal.   ____________________________________________  ED COURSE:  As part of my medical decision making, I reviewed the following data within the [place] History obtained from family if available, nursing notes, old chart and ekg, as well as notes from prior ED visits. Patient presented for suicidal ideation, we will assess with labs s indicated at this time and consult psychiatry.   Procedures ____________________________________________   LABS (pertinent positives/negatives)  Labs Reviewed  COMPREHENSIVE METABOLIC PANEL - Abnormal; Notable for the following components:      Result Value   CO2 20 (*)    Glucose, Bld 105 (*)    Calcium 8.7 (*)    ALT 46 (*)    All other components within normal limits  ETHANOL - Abnormal; Notable for the following components:   Alcohol, Ethyl (B) 267 (*)    All other components within normal limits  ACETAMINOPHEN LEVEL - Abnormal; Notable for the following components:   Acetaminophen (Tylenol), Serum <10 (*)    All other components within normal limits  CBC - Abnormal; Notable for the following components:   MCH 34.1 (*)    All other components within normal limits  SALICYLATE LEVEL  URINE DRUG SCREEN, QUALITATIVE ([facility] ONLY)  ____________________________________________  DIFFERENTIAL DIAGNOSIS   Suicidal ideation, laceration, depression, substance abuse  FINAL ASSESSMENT AND PLAN  Suicidal ideation, alcohol intoxication   Plan: The patient had presented for suicidal ideation with attempt by cutting his own throat.  Patient's labs do indicate significant intoxication.  Otherwise he appears medically stable for psychiatric evaluation.   Laurence Aly, MD   Note: This note was generated in part or whole with voice recognition software. Voice recognition is usually quite accurate but there are transcription errors that can and very often do occur. I apologize for any typographical errors that were not detected and  corrected.     Earleen Newport, MD 01/06/18 [phone]

## 2018-01-06 NOTE — ED Notes (Signed)
Pt observed talking to [facility] MD on computer  - meds to follow

## 2018-01-06 NOTE — ED Notes (Signed)
BEHAVIORAL HEALTH ROUNDING Patient sleeping: No. Patient alert and oriented: yes Behavior appropriate: Yes.  ; If no, describe:  Nutrition and fluids offered: yes Toileting and hygiene offered: Yes  Sitter present: q15 minute observations and security monitoring Law enforcement present: Yes    

## 2018-01-06 NOTE — ED Notes (Signed)
Pt dressed out. Pt belonging bag: shirt, pants, briefs, flip flops, 20$ cash.

## 2018-01-06 NOTE — ED Notes (Signed)
Pt. Resting quietly in room.
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
## 2018-01-06 NOTE — ED Triage Notes (Signed)
Arrives with [facility] under IVC paperwork for evaluation of SI.  Patient cut throat with a razor today "because I'm just tired of living".  Small superficial abrasion seen to throat.  No evidence of bleeding.  Denies HI

## 2018-01-07 NOTE — ED Notes (Signed)
VOL, IVC rescinded per [facility], pt pending D/C

## 2018-01-07 NOTE — ED Provider Notes (Signed)
-----------------------------------------   2:01 AM on 01/07/2018 -----------------------------------------   Blood pressure 111/77, pulse 91, temperature 98.5 F (36.9 C), temperature source Oral, resp. rate 16, height 5\' 10"  (1.778 m), weight 93 kg (205 lb), SpO2 94 %.  The patient had no acute events since last update.  Calm and cooperative at this time.  Disposition is pending Psychiatry/Behavioral Medicine team recommendations.     Darel Hong, MD 01/07/18 0201

## 2018-01-07 NOTE — ED Notes (Signed)
Pt. Up using bathroom, pt returned to room with steady gait. 

## 2018-01-07 NOTE — ED Notes (Signed)
Patient asleep in room. No noted distress or abnormal behavior. Will continue 15 minute checks and observation by security cameras for safety. 

## 2018-01-07 NOTE — ED Provider Notes (Signed)
Patient evaluated by psychiatry. They have rescinded IVC. Feel patient is safe for discharge home.   Nance Pear, MD 01/07/18 [phone]

## 2018-01-07 NOTE — ED Notes (Signed)
Pt discharged home. IVC rescinded. Pt denies SI/HI. VS stable. All belongings returned to patient. Patient ride waiting in lobby. Discharge instructions reviewed with patient.

## 2018-01-07 NOTE — ED Notes (Signed)
Pt wanting to know when he would be able to see a doctor. RN explained he would be speaking to the telepsych doctor this afternoon. Pt accepting. No behavorial issues.   Maintained on 15 minute checks and observation by security camera for safety.

## 2018-01-07 NOTE — Discharge Instructions (Signed)
Please seek medical attention for any high fevers, chest pain, shortness of breath, change in behavior, persistent vomiting, bloody stool or any other new or concerning symptoms.  

## 2018-01-25 ENCOUNTER — Encounter: Payer: Self-pay | Admitting: Emergency Medicine

## 2018-01-25 ENCOUNTER — Emergency Department
Admission: EM | Admit: 2018-01-25 | Discharge: 2018-01-25 | Disposition: A | Discharge status: Home / Self Care | Attending: Emergency Medicine | Admitting: Emergency Medicine

## 2018-01-25 ENCOUNTER — Other Ambulatory Visit: Payer: Self-pay

## 2018-01-25 DIAGNOSIS — F1994 Other psychoactive substance use, unspecified with psychoactive substance-induced mood disorder: Secondary | ICD-10-CM | POA: Diagnosis present

## 2018-01-25 DIAGNOSIS — I10 Essential (primary) hypertension: Secondary | ICD-10-CM | POA: Diagnosis not present

## 2018-01-25 DIAGNOSIS — F10920 Alcohol use, unspecified with intoxication, uncomplicated: Secondary | ICD-10-CM | POA: Diagnosis not present

## 2018-01-25 DIAGNOSIS — F32A Depression, unspecified: Secondary | ICD-10-CM

## 2018-01-25 DIAGNOSIS — F17210 Nicotine dependence, cigarettes, uncomplicated: Secondary | ICD-10-CM | POA: Diagnosis not present

## 2018-01-25 DIAGNOSIS — F1020 Alcohol dependence, uncomplicated: Secondary | ICD-10-CM | POA: Diagnosis present

## 2018-01-25 DIAGNOSIS — F329 Major depressive disorder, single episode, unspecified: Secondary | ICD-10-CM | POA: Diagnosis not present

## 2018-01-25 DIAGNOSIS — F1010 Alcohol abuse, uncomplicated: Secondary | ICD-10-CM

## 2018-01-25 DIAGNOSIS — F10230 Alcohol dependence with withdrawal, uncomplicated: Secondary | ICD-10-CM | POA: Diagnosis present

## 2018-01-25 LAB — URINE DRUG SCREEN, QUALITATIVE (ARMC ONLY)
Amphetamines, Ur Screen: NOT DETECTED
BARBITURATES, UR SCREEN: NOT DETECTED
Benzodiazepine, Ur Scrn: NOT DETECTED
CANNABINOID 50 NG, UR ~~LOC~~: NOT DETECTED
Cocaine Metabolite,Ur ~~LOC~~: NOT DETECTED
MDMA (Ecstasy)Ur Screen: NOT DETECTED
METHADONE SCREEN, URINE: NOT DETECTED
OPIATE, UR SCREEN: NOT DETECTED
PHENCYCLIDINE (PCP) UR S: NOT DETECTED
Tricyclic, Ur Screen: NOT DETECTED

## 2018-01-25 LAB — ACETAMINOPHEN LEVEL: Acetaminophen (Tylenol), Serum: <10 ug/mL — ABNORMAL LOW (ref 10–30)

## 2018-01-25 LAB — COMPREHENSIVE METABOLIC PANEL
ALT: 39 U/L (ref 0–44)
AST: 37 U/L (ref 15–41)
Albumin: 4.4 g/dL (ref 3.5–5.0)
Alkaline Phosphatase: 72 U/L (ref 38–126)
Anion gap: 11 (ref 5–15)
CO2: 21 mmol/L — ABNORMAL LOW (ref 22–32)
CREATININE: 0.93 mg/dL (ref 0.61–1.24)
Calcium: 8.5 mg/dL — ABNORMAL LOW (ref 8.9–10.3)
Chloride: 107 mmol/L (ref 98–111)
GFR calc Af Amer: >60 mL/min (ref >60)
GFR calc non Af Amer: >60 mL/min (ref >60)
Glucose, Bld: 107 mg/dL — ABNORMAL HIGH (ref 70–99)
Potassium: 3.6 mmol/L (ref 3.5–5.1)
Sodium: 139 mmol/L (ref 135–145)
Total Bilirubin: 0.6 mg/dL (ref 0.3–1.2)
Total Protein: 8.3 g/dL — ABNORMAL HIGH (ref 6.5–8.1)

## 2018-01-25 LAB — CBC
HEMATOCRIT: 46.1 % (ref 40.0–52.0)
Hemoglobin: 16.1 g/dL (ref 13.0–18.0)
MCH: 34.6 pg — AB (ref 26.0–34.0)
MCHC: 35 g/dL (ref 32.0–36.0)
MCV: 98.8 fL (ref 80.0–100.0)
Platelets: 187 10*3/uL (ref 150–440)
RBC: 4.66 MIL/uL (ref 4.40–5.90)
RDW: 13.3 % (ref 11.5–14.5)
WBC: 8.4 10*3/uL (ref 3.8–10.6)

## 2018-01-25 LAB — ETHANOL: Alcohol, Ethyl (B): 335 mg/dL (ref <10)

## 2018-01-25 LAB — SALICYLATE LEVEL: Salicylate Lvl: <7 mg/dL (ref 2.8–30.0)

## 2018-01-25 NOTE — ED Notes (Signed)
BEHAVIORAL HEALTH ROUNDING Patient sleeping: Yes.   Patient alert and oriented: eyes closed  Appears to be asleep Behavior appropriate: Yes.  ; If no, describe:  Nutrition and fluids offered: Yes  Toileting and hygiene offered: sleeping Sitter present: q 15 minute observations and security monitoring Law enforcement present: yes

## 2018-01-25 NOTE — ED Notes (Signed)
Pt given meal tray.

## 2018-01-25 NOTE — ED Provider Notes (Signed)
[facility] Emergency Department Provider Note  Time seen: 8:21 AM  I have reviewed the triage vital signs and the nursing notes.   HISTORY  Chief Complaint Alcohol Intoxication and Psychiatric Evaluation    HPI Jesse Obrien is a 57 y.o. male with a past medical history of hypertension, alcohol abuse, depression, presents to the emergency department for depression and alcohol use.  According to the patient he has been going through a lot recently, states he is going through a likely divorce.  He has been drinking alcohol, states his last drink was approximately 3 hours ago.  States he has been having worsening depression.  Patient denies any SI or HI.  Has no medical complaints today.   Past Medical History:  Diagnosis Date  . DJD (degenerative joint disease)   . Hypertension 01/02/2015    Patient Active Problem List   Diagnosis Date Noted  . Suicidal behavior 10/28/2015  . Noncompliance 10/28/2015  . Tobacco use disorder 10/28/2015  . Substance induced mood disorder ([place]) 10/28/2015  . GERD (gastroesophageal reflux disease) 10/28/2015  . Alcohol use disorder, severe, dependence ([place]) 03/20/2015  . DDD (degenerative disc disease), lumbar 03/20/2015  . Osteoarthrosis, unspecified whether generalized or localized, involving lower leg 03/20/2015  . Spinal stenosis of lumbar region 03/20/2015  . Major depressive disorder, recurrent, severe with psychotic features ([place]) 03/19/2015  . Self inflicted neck laceration 01/02/2015  . Hypertension 01/02/2015    History reviewed. No pertinent surgical history.  Prior to Admission medications   Medication Sig Start Date End Date Taking? Authorizing Provider  buPROPion (WELLBUTRIN XL) 150 MG 24 hr tablet Take 1 tablet (150 mg total) by mouth daily. Patient not taking: Reported on 01/06/2018 11/01/15   Clapacs, Madie [place], MD  gabapentin (NEURONTIN) 400 MG capsule Take 1 capsule (400 mg total) by mouth 3 (three) times  daily. Patient not taking: Reported on 01/06/2018 11/01/15   Clapacs, Madie Reno, MD  meloxicam (MOBIC) 7.5 MG tablet Take 1 tablet (7.5 mg total) by mouth 2 (two) times daily before a meal. Patient not taking: Reported on 01/06/2018 11/01/15   Clapacs, Madie Reno, MD  pantoprazole (PROTONIX) 40 MG tablet Take 1 tablet (40 mg total) by mouth daily. Patient not taking: Reported on 01/06/2018 11/01/15   Clapacs, Madie Reno, MD  traZODone (DESYREL) 100 MG tablet Take 1 tablet (100 mg total) by mouth at bedtime. Patient not taking: Reported on 01/06/2018 11/01/15   Clapacs, Madie Reno, MD    No Known Allergies  History reviewed. No pertinent family history.  Social History Social History   Tobacco Use  . Smoking status: Current Every Day Smoker    Years: 15.00  . Smokeless tobacco: Never Used  Substance Use Topics  . Alcohol use: Yes  . Drug use: No    Review of Systems Constitutional: Negative for fever. Cardiovascular: Negative for chest pain. Respiratory: Negative for shortness of breath.  Occasional cough. Gastrointestinal: Negative for abdominal pain, vomiting and diarrhea. Genitourinary: Negative for urinary compaints Musculoskeletal: Negative for musculoskeletal complaints Skin: Negative for skin complaints  Neurological: Negative for headache All other ROS negative  ____________________________________________   PHYSICAL EXAM:  VITAL SIGNS: ED Triage Vitals  Enc Vitals Group     BP 01/25/18 0751 (!) 151/94     Pulse Rate 01/25/18 0751 97     Resp 01/25/18 0751 18     Temp 01/25/18 0751 98.4 F (36.9 C)     Temp Source 01/25/18 0751 Oral  SpO2 01/25/18 0751 96 %     Weight 01/25/18 0752 200 lb (90.7 kg)     Height 01/25/18 0752 5\' 10"  (1.778 m)     Head Circumference --      Peak Flow --      Pain Score 01/25/18 0752 0     Pain Loc --      Pain Edu? --      Excl. in [place]? --    Constitutional: Alert and oriented. Well appearing and in no distress. Eyes: Normal exam ENT    Head: Normocephalic and atraumatic.   Mouth/Throat: Mucous membranes are moist. Cardiovascular: Normal rate, regular rhythm. No murmur Respiratory: Normal respiratory effort without tachypnea nor retractions. Breath sounds are clear Gastrointestinal: Soft and nontender. No distention.  Musculoskeletal: Nontender with normal range of motion in all extremities Neurologic:  Normal speech and language. No gross focal neurologic deficits Skin:  Skin is warm, dry and intact.  Psychiatric: Mood and affect are normal.   ____________________________________________   INITIAL IMPRESSION / ASSESSMENT AND PLAN / ED COURSE  Pertinent labs & imaging results that were available during my care of the patient were reviewed by me and considered in my medical decision making (see chart for details).  Patient presents to the emergency department for alcohol use, depression.  States worsening depression currently going through a divorce.  Denies any SI or HI.  Does not meet IVC criteria.  We will check labs, have psychiatry see the patient.  Currently here on a voluntary basis.  The patient has been seen by psychiatry.  They believe the patient is safe for discharge home.  Patient will be discharged home.  Has been provided outpatient resources.  ____________________________________________   FINAL CLINICAL IMPRESSION(S) / ED DIAGNOSES  Alcohol use disorder Depression    Harvest Dark, MD 01/25/18 1444

## 2018-01-25 NOTE — ED Notes (Signed)
Pt talking to this tech repeatedly stating, "You look good in those scrubs. Come in here. I'm 56 but I know you look good in those scrubs." This tech moved chair out of view of the pts room in order to avoid inappropriate comments.

## 2018-01-25 NOTE — Discharge Instructions (Addendum)
You have been seen in the emergency department for a  psychiatric concern. You have been evaluated both medically as well as psychiatrically. Please follow-up with your outpatient resources provided. Return to the emergency department for any worsening symptoms, or any thoughts of hurting yourself or anyone else so that we may attempt to help you. 

## 2018-01-25 NOTE — ED Notes (Signed)
Pt dressed out by this RN and EDT [employer]
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
## 2018-01-25 NOTE — ED Triage Notes (Signed)
Pt arrived via [facility] voluntarily with reports of alcohol intoxication, at present pt is denying SI/HI, but states he has had these thoughts in the past.   Pt initated the call to [facility].  Pt states he usually goes on a drinking binge every 3-4 days, states he drinks about 24 beers, but state "it's not enough"  Pt cooperative in triage.

## 2018-01-25 NOTE — Consult Note (Signed)
[name]   Reason for Consult: Consult for this 57 year old man with a history of alcohol abuse who presented intoxicated Referring Physician: Paduchowski Patient Identification: Jesse Obrien MRN:  [id number] Principal Diagnosis: Substance induced mood disorder ([place]) Diagnosis:   Patient Active Problem List   Diagnosis Date Noted  . Suicidal behavior [R46.89] 10/28/2015  . Noncompliance [Z91.19] 10/28/2015  . Tobacco use disorder [F17.200] 10/28/2015  . Substance induced mood disorder ([place]) [F19.94] 10/28/2015  . GERD (gastroesophageal reflux disease) [K21.9] 10/28/2015  . Alcohol use disorder, severe, dependence ([place]) [F10.20] 03/20/2015  . DDD (degenerative disc disease), lumbar [M51.36] 03/20/2015  . Osteoarthrosis, unspecified whether generalized or localized, involving lower leg [M17.10] 03/20/2015  . Spinal stenosis of lumbar region [M48.061] 03/20/2015  . 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 Self inflicted neck laceration [IMO0002] 01/02/2015  . Hypertension [I10] 01/02/2015    Total Time spent with patient: 1 hour  Subjective:   Jesse Obrien is a 57 y.o. male patient admitted with "I was drinking" patient seen chart reviewed.  57 year old man with a long history of alcohol abuse came to the hospital intoxicated blood alcohol level up over 300.  Was belligerent and agitated but has been sleeping and sobering up when I saw him.  Patient says he is been drinking about.  HPI: 15 beers a day.  Denies other drug use.  Denies any depression denies suicidal or homicidal ideation.  Patient presents as somewhat irritable but not psychotic and not threatening.  Indicates that he has little interest in stopping drinking and little motivation to make much change.  No specific new stressor reported.  Social history: Retired lives at home with his wife  Medical history: Multiple chronic medical  problems including some chronic pain  Substance abuse history: Long history of alcohol abuse usually binge type.  No history of DTs or seizures  Past Psychiatric History: Patient has been seen many times previously in the emergency room and has had admissions to the hospital in the past as well.  There have been times in the past when he was intoxicated and cut himself or did other things to intentionally injure himself.  Has been diagnosed with depression in the past.  Not currently compliant with any outpatient psychiatric follow-up.  Risk to Self: Suicidal Ideation: No Suicidal Intent: No Is patient at risk for suicide?: No Suicidal Plan?: No Access to Means: No What has been your use of drugs/alcohol within the last 12 months?: Alcohol How many times?: 1 Other Self Harm Risks: Ongoing substance use Triggers for Past Attempts: Family contact Intentional Self Injurious Behavior: Cutting Comment - Self Injurious Behavior: hx of cutting with a razor Risk to Others: Homicidal Ideation: No Thoughts of Harm to Others: No Current Homicidal Intent: No Current Homicidal Plan: No Access to Homicidal Means: No Identified Victim: None reported History of harm to others?: No Assessment of Violence: None Noted Violent Behavior Description: None reported Does patient have access to weapons?: No Criminal Charges Pending?: No Does patient have a court date: No Prior Inpatient Therapy: Prior Inpatient Therapy: Yes Prior Therapy Dates: 2017, 2016, 2015 Prior Therapy Facilty/Provider(s): [facility] Reason for Treatment: Depression Prior Outpatient Therapy: Prior Outpatient Therapy: No Does patient have an ACCT team?: No Does patient have Intensive In-House Services?  : No Does patient have [facility] services? : No Does patient have P4CC services?: No  Past Medical History:  Past Medical History:  Diagnosis Date  . DJD (  degenerative joint disease)   . Hypertension 01/02/2015   History reviewed. No  pertinent surgical history. Family History: History reviewed. No pertinent family history. Family Psychiatric  History: None reported Social History:  Social History   Substance and Sexual Activity  Alcohol Use Yes     Social History   Substance and Sexual Activity  Drug Use No    Social History   Socioeconomic History  . Marital status: Married    Spouse name: Not on file  . Number of children: Not on file  . Years of education: Not on file  . Highest education level: Not on file  Occupational History  . Not on file  Social Needs  . Financial resource strain: Not on file  . Food insecurity:    Worry: Not on file    Inability: Not on file  . Transportation needs:    Medical: Not on file    Non-medical: Not on file  Tobacco Use  . Smoking status: Current Every Day Smoker    Years: 15.00  . Smokeless tobacco: Never Used  Substance and Sexual Activity  . Alcohol use: Yes  . Drug use: No  . Sexual activity: Yes    Birth control/protection: Condom  Lifestyle  . Physical activity:    Days per week: Not on file    Minutes per session: Not on file  . Stress: Not on file  Relationships  . Social connections:    Talks on phone: Not on file    Gets together: Not on file    Attends religious service: Not on file    Active member of club or organization: Not on file    Attends meetings of clubs or organizations: Not on file    Relationship status: Not on file  Other Topics Concern  . Not on file  Social History Narrative  . Not on file   Additional Social History:    Allergies:  No Known Allergies  Labs:  Results for orders placed or performed during the hospital encounter of 01/25/18 (from the past 48 hour(s))  Comprehensive metabolic panel     Status: Abnormal   Collection Time: 01/25/18  8:03 AM  Result Value Ref Range   Sodium 139 135 - 145 mmol/L   Potassium 3.6 3.5 - 5.1 mmol/L   Chloride 107 98 - 111 mmol/L   CO2 21 (L) 22 - 32 mmol/L   Glucose, Bld  107 (H) 70 - 99 mg/dL   BUN <5 (L) 6 - 20 mg/dL   Creatinine, Ser 0.93 0.61 - 1.24 mg/dL   Calcium 8.5 (L) 8.9 - 10.3 mg/dL   Total Protein 8.3 (H) 6.5 - 8.1 g/dL   Albumin 4.4 3.5 - 5.0 g/dL   AST 37 15 - 41 U/L   ALT 39 0 - 44 U/L   Alkaline Phosphatase 72 38 - 126 U/L   Total Bilirubin 0.6 0.3 - 1.2 mg/dL   GFR calc non Af Amer >60 >60 mL/min   GFR calc Af Amer >60 >60 mL/min    Comment: (NOTE) The eGFR has been calculated using the CKD EPI equation. This calculation has not been validated in all clinical situations. eGFR's persistently <60 mL/min signify possible Chronic Kidney Disease.    Anion gap 11 5 - 15    Comment: Performed at [facility], [address] [place], [place] [address]  Ethanol     Status: Abnormal   Collection Time: 01/25/18  8:03 AM  Result Value Ref  Range   Alcohol, Ethyl (B) 335 (HH) <10 mg/dL    Comment: CRITICAL RESULT CALLED TO, READ BACK BY AND VERIFIED WITH AMY TEAGUE AT 0981 01/25/18 DAS (NOTE) Lowest detectable limit for serum alcohol is 10 mg/dL. For medical purposes only. Performed at [facility], [address] [place], [place] [address]   cbc     Status: Abnormal   Collection Time: 01/25/18  8:03 AM  Result Value Ref Range   WBC 8.4 3.8 - 10.6 K/uL   RBC 4.66 4.40 - 5.90 MIL/uL   Hemoglobin 16.1 13.0 - 18.0 g/dL   HCT 46.1 40.0 - 52.0 %   MCV 98.8 80.0 - 100.0 fL   MCH 34.6 (H) 26.0 - 34.0 pg   MCHC 35.0 32.0 - 36.0 g/dL   RDW 13.3 11.5 - 14.5 %   Platelets 187 150 - 440 K/uL    Comment: Performed at [facility], [address] [place], [place] [address]  Urine Drug Screen, Qualitative     Status: None   Collection Time: 01/25/18  8:03 AM  Result Value Ref Range   Tricyclic, Ur Screen NONE DETECTED NONE DETECTED   Amphetamines, Ur Screen NONE DETECTED NONE DETECTED   MDMA (Ecstasy)Ur Screen NONE DETECTED NONE DETECTED   Cocaine Metabolite,Ur Mabie NONE DETECTED NONE DETECTED   Opiate, Ur Screen  NONE DETECTED NONE DETECTED   Phencyclidine (PCP) Ur S NONE DETECTED NONE DETECTED   Cannabinoid 50 Ng, Ur Lillie NONE DETECTED NONE DETECTED   Barbiturates, Ur Screen NONE DETECTED NONE DETECTED   Benzodiazepine, Ur Scrn NONE DETECTED NONE DETECTED   Methadone Scn, Ur NONE DETECTED NONE DETECTED    Comment: (NOTE) Tricyclics + metabolites, urine    Cutoff 1000 ng/mL Amphetamines + metabolites, urine  Cutoff 1000 ng/mL MDMA (Ecstasy), urine              Cutoff 500 ng/mL Cocaine Metabolite, urine          Cutoff 300 ng/mL Opiate + metabolites, urine        Cutoff 300 ng/mL Phencyclidine (PCP), urine         Cutoff 25 ng/mL Cannabinoid, urine                 Cutoff 50 ng/mL Barbiturates + metabolites, urine  Cutoff 200 ng/mL Benzodiazepine, urine              Cutoff 200 ng/mL Methadone, urine                   Cutoff 300 ng/mL The urine drug screen provides only a preliminary, unconfirmed analytical test result and should not be used for non-medical purposes. Clinical consideration and professional judgment should be applied to any positive drug screen result due to possible interfering substances. A more specific alternate chemical method must be used in order to obtain a confirmed analytical result. Gas chromatography / mass spectrometry (GC/MS) is the preferred confirmat ory method. Performed at [facility], [address] [place], [place] [address]   Acetaminophen level     Status: Abnormal   Collection Time: 01/25/18  8:03 AM  Result Value Ref Range   Acetaminophen (Tylenol), Serum <10 (L) 10 - 30 ug/mL    Comment: (NOTE) Therapeutic concentrations vary significantly. A range of 10-30 ug/mL  may be an effective concentration for many patients. However, some  are best treated at concentrations outside of this range. Acetaminophen concentrations >150 ug/mL at 4 hours after ingestion  and >  50 ug/mL at 12 hours after ingestion are often associated with  toxic  reactions. Performed at [facility], [address] [place], [place] [address]   Salicylate level     Status: None   Collection Time: 01/25/18  8:03 AM  Result Value Ref Range   Salicylate Lvl <7.5 2.8 - 30.0 mg/dL    Comment: Performed at [facility], [address] [place], [place] [address]    No current facility-administered medications for this encounter.    Current Outpatient Medications  Medication Sig Dispense Refill  . buPROPion (WELLBUTRIN XL) 150 MG 24 hr tablet Take 1 tablet (150 mg total) by mouth daily. (Patient not taking: Reported on 01/06/2018) 30 tablet 0  . gabapentin (NEURONTIN) 400 MG capsule Take 1 capsule (400 mg total) by mouth 3 (three) times daily. (Patient not taking: Reported on 01/06/2018) 90 capsule 0  . meloxicam (MOBIC) 7.5 MG tablet Take 1 tablet (7.5 mg total) by mouth 2 (two) times daily before a meal. (Patient not taking: Reported on 01/06/2018) 60 tablet 0  . pantoprazole (PROTONIX) 40 MG tablet Take 1 tablet (40 mg total) by mouth daily. (Patient not taking: Reported on 01/06/2018) 30 tablet 0  . traZODone (DESYREL) 100 MG tablet Take 1 tablet (100 mg total) by mouth at bedtime. (Patient not taking: Reported on 01/06/2018) 30 tablet 0    Musculoskeletal: Strength & Muscle Tone: within normal limits Gait & Station: normal Patient leans: N/A  Psychiatric Specialty Exam: Physical Exam  Nursing note and vitals reviewed. Constitutional: He appears well-developed and well-nourished.  HENT:  Head: Normocephalic and atraumatic.  Eyes: Pupils are equal, round, and reactive to light. Conjunctivae are normal.  Neck: Normal range of motion.  Cardiovascular: Regular rhythm and normal heart sounds.  Respiratory: Effort normal. No respiratory distress.  GI: Soft.  Musculoskeletal: Normal range of motion.  Neurological: He is alert.  Skin: Skin is warm and dry.  Psychiatric: His affect is blunt. His speech is delayed. He is slowed.  Cognition and memory are impaired. He expresses impulsivity. He expresses no homicidal and no suicidal ideation.    Review of Systems  Constitutional: Negative.   HENT: Negative.   Eyes: Negative.   Respiratory: Negative.   Cardiovascular: Negative.   Gastrointestinal: Negative.   Musculoskeletal: Negative.   Skin: Negative.   Neurological: Negative.   Psychiatric/Behavioral: Positive for substance abuse. Negative for depression, hallucinations, memory loss and suicidal ideas. The patient is not nervous/anxious and does not have insomnia.     Blood pressure (!) 144/79, pulse 82, temperature 98.4 F (36.9 C), temperature source Oral, resp. rate 17, height '5\' 10"'  (1.778 m), weight 90.7 kg (200 lb), SpO2 98 %.Body mass index is 28.7 kg/m.  General Appearance: Casual  Eye Contact:  Fair  Speech:  Clear and Coherent  Volume:  Normal  Mood:  Dysphoric  Affect:  Constricted  Thought Process:  Goal Directed  Orientation:  Full (Time, Place, and Person)  Thought Content:  Logical  Suicidal Thoughts:  No  Homicidal Thoughts:  No  Memory:  Immediate;   Fair Recent;   Fair Remote;   Fair  Judgement:  Fair  Insight:  Shallow  Psychomotor Activity:  Decreased  Concentration:  Concentration: Fair  Recall:  [employer] of Knowledge:  Fair  Language:  Fair  Akathisia:  No  Handed:  Right  AIMS (if indicated):     Assets:  Desire for Improvement Housing Social Support  ADL's:  Intact  Cognition:  WNL  Sleep:        Treatment Plan Summary: Plan Patient came into the hospital intoxicated but has now sobered up.  He does not have a history of seizures or delirium tremens.  Currently presents as lucid and clear thinking and denies any suicidal ideation.  We spent some time doing some talk about his alcohol use and the patient says that on one hand he would sort of like to quit but does not really see much benefit in engaging in any kind of treatment.  He thinks perhaps he will simply pray  and that will take care of the problem.  Patient offered referral to [facility] as usual.  Does not however require inpatient treatment or meet commitment criteria.  Disposition: No evidence of imminent risk to self or others at present.   Patient does not meet criteria for psychiatric inpatient admission.  Alethia Berthold, MD 01/25/2018 6:46 PM

## 2018-01-25 NOTE — ED Notes (Signed)

## 2019-02-18 ENCOUNTER — Encounter: Payer: Self-pay | Admitting: Psychiatry

## 2019-02-18 ENCOUNTER — Emergency Department
Admission: EM | Admit: 2019-02-18 | Discharge: 2019-02-19 | Disposition: A | Discharge status: Home / Self Care | Attending: Emergency Medicine | Admitting: Emergency Medicine

## 2019-02-18 DIAGNOSIS — F17200 Nicotine dependence, unspecified, uncomplicated: Secondary | ICD-10-CM | POA: Insufficient documentation

## 2019-02-18 DIAGNOSIS — F10929 Alcohol use, unspecified with intoxication, unspecified: Secondary | ICD-10-CM

## 2019-02-18 DIAGNOSIS — I10 Essential (primary) hypertension: Secondary | ICD-10-CM | POA: Insufficient documentation

## 2019-02-18 DIAGNOSIS — F329 Major depressive disorder, single episode, unspecified: Secondary | ICD-10-CM | POA: Diagnosis present

## 2019-02-18 DIAGNOSIS — F10230 Alcohol dependence with withdrawal, uncomplicated: Secondary | ICD-10-CM | POA: Diagnosis not present

## 2019-02-18 DIAGNOSIS — F1020 Alcohol dependence, uncomplicated: Secondary | ICD-10-CM | POA: Diagnosis present

## 2019-02-18 LAB — CBC WITH DIFFERENTIAL/PLATELET
Abs Immature Granulocytes: 0.07 10*3/uL (ref 0.00–0.07)
Basophils Absolute: 0.1 10*3/uL (ref 0.0–0.1)
Basophils Relative: 1 %
Eosinophils Absolute: 0.3 10*3/uL (ref 0.0–0.5)
Eosinophils Relative: 3 %
HCT: 44.7 % (ref 39.0–52.0)
Hemoglobin: 15.3 g/dL (ref 13.0–17.0)
Immature Granulocytes: 1 %
Lymphocytes Relative: 37 %
Lymphs Abs: 3.8 10*3/uL (ref 0.7–4.0)
MCH: 33.2 pg (ref 26.0–34.0)
MCHC: 34.2 g/dL (ref 30.0–36.0)
MCV: 97 fL (ref 80.0–100.0)
Monocytes Absolute: 0.8 10*3/uL (ref 0.1–1.0)
Monocytes Relative: 8 %
Neutro Abs: 5.2 10*3/uL (ref 1.7–7.7)
Neutrophils Relative %: 50 %
Platelets: 178 10*3/uL (ref 150–400)
RBC: 4.61 MIL/uL (ref 4.22–5.81)
RDW: 12.4 % (ref 11.5–15.5)
WBC: 10.1 10*3/uL (ref 4.0–10.5)
nRBC: 0 % (ref 0.0–0.2)

## 2019-02-18 LAB — COMPREHENSIVE METABOLIC PANEL
ALT: 46 U/L — ABNORMAL HIGH (ref 0–44)
AST: 41 U/L (ref 15–41)
Albumin: 4.2 g/dL (ref 3.5–5.0)
Alkaline Phosphatase: 71 U/L (ref 38–126)
Anion gap: 10 (ref 5–15)
BUN: 5 mg/dL — ABNORMAL LOW (ref 6–20)
CO2: 21 mmol/L — ABNORMAL LOW (ref 22–32)
Calcium: 8.6 mg/dL — ABNORMAL LOW (ref 8.9–10.3)
Chloride: 104 mmol/L (ref 98–111)
Creatinine, Ser: 0.76 mg/dL (ref 0.61–1.24)
GFR calc Af Amer: >60 mL/min (ref >60)
GFR calc non Af Amer: >60 mL/min (ref >60)
Glucose, Bld: 110 mg/dL — ABNORMAL HIGH (ref 70–99)
Potassium: 3.3 mmol/L — ABNORMAL LOW (ref 3.5–5.1)
Sodium: 135 mmol/L (ref 135–145)
Total Bilirubin: 0.7 mg/dL (ref 0.3–1.2)
Total Protein: 7.8 g/dL (ref 6.5–8.1)

## 2019-02-18 LAB — URINE DRUG SCREEN, QUALITATIVE (ARMC ONLY)
Amphetamines, Ur Screen: NOT DETECTED
Barbiturates, Ur Screen: NOT DETECTED
Benzodiazepine, Ur Scrn: NOT DETECTED
Cannabinoid 50 Ng, Ur ~~LOC~~: NOT DETECTED
Cocaine Metabolite,Ur ~~LOC~~: NOT DETECTED
MDMA (Ecstasy)Ur Screen: NOT DETECTED
Methadone Scn, Ur: NOT DETECTED
Opiate, Ur Screen: NOT DETECTED
Phencyclidine (PCP) Ur S: NOT DETECTED
Tricyclic, Ur Screen: NOT DETECTED

## 2019-02-18 LAB — URINALYSIS, COMPLETE (UACMP) WITH MICROSCOPIC
Bacteria, UA: NONE SEEN
Bilirubin Urine: NEGATIVE
Glucose, UA: NEGATIVE mg/dL
Ketones, ur: NEGATIVE mg/dL
Leukocytes,Ua: NEGATIVE
Nitrite: NEGATIVE
Protein, ur: NEGATIVE mg/dL
Specific Gravity, Urine: 1.001 — ABNORMAL LOW (ref 1.005–1.030)
Squamous Epithelial / HPF: NONE SEEN (ref 0–5)
WBC, UA: NONE SEEN WBC/hpf (ref 0–5)
pH: 6 (ref 5.0–8.0)

## 2019-02-18 LAB — ACETAMINOPHEN LEVEL: Acetaminophen (Tylenol), Serum: <10 ug/mL — ABNORMAL LOW (ref 10–30)

## 2019-02-18 LAB — ETHANOL: Alcohol, Ethyl (B): 282 mg/dL — ABNORMAL HIGH (ref <10)

## 2019-02-18 LAB — SALICYLATE LEVEL: Salicylate Lvl: <7 mg/dL (ref 2.8–30.0)

## 2019-02-18 MED ORDER — VITAMIN B-1 100 MG PO TABS: 100.0000 mg | ORAL_TABLET | Freq: Every day | ORAL | Status: DC

## 2019-02-18 MED ORDER — ZIPRASIDONE MESYLATE 20 MG IM SOLR
10.0000 mg | Freq: Once | INTRAMUSCULAR | Status: AC
  Administered 2019-02-18: 10 mg via INTRAMUSCULAR
  Filled 2019-02-18: qty 20

## 2019-02-18 MED ORDER — LORAZEPAM 1 MG PO TABS: 1.0000 mg | ORAL_TABLET | Freq: Three times a day (TID) | ORAL | Status: DC

## 2019-02-18 MED ORDER — LOPERAMIDE HCL 2 MG PO CAPS
2.0000 mg | ORAL_CAPSULE | ORAL | Status: DC | PRN
  Filled 2019-02-18: qty 2

## 2019-02-18 MED ORDER — LORAZEPAM 1 MG PO TABS: 1.0000 mg | ORAL_TABLET | Freq: Two times a day (BID) | ORAL | Status: DC

## 2019-02-18 MED ORDER — HYDROXYZINE HCL 25 MG PO TABS
25.0000 mg | ORAL_TABLET | Freq: Four times a day (QID) | ORAL | Status: DC | PRN
  Administered 2019-02-18: 20:00:00 25 mg via ORAL
  Filled 2019-02-18: qty 1

## 2019-02-18 MED ORDER — LORAZEPAM 1 MG PO TABS: 1.0000 mg | ORAL_TABLET | Freq: Four times a day (QID) | ORAL | Status: DC

## 2019-02-18 MED ORDER — DIPHENHYDRAMINE HCL 50 MG/ML IJ SOLN
50.0000 mg | Freq: Once | INTRAMUSCULAR | Status: AC
  Administered 2019-02-18: 08:00:00 50 mg via INTRAMUSCULAR
  Filled 2019-02-18: qty 1

## 2019-02-18 MED ORDER — LORAZEPAM 1 MG PO TABS: 1.0000 mg | ORAL_TABLET | Freq: Every day | ORAL | Status: DC

## 2019-02-18 MED ORDER — LORAZEPAM 2 MG/ML IJ SOLN
2.0000 mg | Freq: Once | INTRAMUSCULAR | Status: AC
  Administered 2019-02-18: 08:00:00 2 mg via INTRAMUSCULAR
  Filled 2019-02-18: qty 1

## 2019-02-18 MED ORDER — LORAZEPAM 1 MG PO TABS: 1.0000 mg | ORAL_TABLET | Freq: Four times a day (QID) | ORAL | Status: DC | PRN

## 2019-02-18 MED ORDER — ONDANSETRON 4 MG PO TBDP
4.0000 mg | ORAL_TABLET | Freq: Four times a day (QID) | ORAL | Status: DC | PRN
  Filled 2019-02-18: qty 1

## 2019-02-18 MED ORDER — THIAMINE HCL 100 MG/ML IJ SOLN: 100.0000 mg | Freq: Once | INTRAMUSCULAR | Status: DC

## 2019-02-18 MED ORDER — ADULT MULTIVITAMIN W/MINERALS CH: 1.0000 | ORAL_TABLET | Freq: Every day | ORAL | Status: DC

## 2019-02-18 NOTE — ED Notes (Signed)
Pt verbally aggressive with staff and refusing to move to hallway bed or dress in scrubs.  EDP spoke with patient. Waiting for orders.
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
## 2019-02-18 NOTE — ED Triage Notes (Signed)
Patient reports that he has a drinking problem and he has come in to seek help. Pt. Reports he has had this problem his whole life.

## 2019-02-18 NOTE — ED Notes (Addendum)
Pt given IM injections.  Pt took injections willingly.

## 2019-02-18 NOTE — ED Notes (Signed)
Patient talking with psychiatric NP York Cerise

## 2019-02-18 NOTE — ED Notes (Signed)
PT  PLACED  UNDER  IVC  PER  MD  INFORMED  AMY  RN

## 2019-02-18 NOTE — ED Notes (Signed)
Pt asleep, lunch tray placed on sink in rm.  

## 2019-02-18 NOTE — ED Notes (Addendum)
Report given to [place], Therapist, sports.

## 2019-02-18 NOTE — ED Notes (Signed)
Report to include Situation, Background, Assessment, and Recommendations received from Jadeka RN. Patient alert and oriented, warm and dry, in no acute distress. Patient denies SI, HI, AVH and pain. Patient made aware of Q15 minute rounds and security cameras for their safety. Patient instructed to come to me with needs or concerns. 

## 2019-02-18 NOTE — Progress Notes (Signed)
Patient able to be complaint with providing UA sampling, will attempt to again gain compliance in changing out to hospital attire.

## 2019-02-18 NOTE — ED Provider Notes (Signed)
[facility] Emergency Department Provider Note  ____________________________________________  Time seen: Approximately 10:51 AM  I have reviewed the triage vital signs and the nursing notes.   HISTORY  Chief Complaint Alcohol Problem    HPI Jesse Obrien is a 58 y.o. male with a history of hypertension, alcohol abuse, major depression, suicide attempt who comes the ED by EMS today after heavy drinking.  States that he wants help due to his alcohol abuse.  However, he is also verbally abusive, shouting at myself and other staff to "get the hell out of here."  Difficult to reorient the patient to the fact that he is in the emergency department and this is where we work and that we are here to help him.  Reports that he is a binge drinker and does not drink every day but when he does he might drink a case or 2.  Denies shaking seizures or hallucinosis.  Denies hallucinations currently but is evasive about SI or HI.  Denies drug use.     Past Medical History:  Diagnosis Date  . DJD (degenerative joint disease)   . Hypertension 01/02/2015     Patient Active Problem List   Diagnosis Date Noted  . Suicidal behavior 10/28/2015  . Noncompliance 10/28/2015  . Tobacco use disorder 10/28/2015  . Substance induced mood disorder ([place]) 10/28/2015  . GERD (gastroesophageal reflux disease) 10/28/2015  . Alcohol use disorder, severe, dependence ([place]) 03/20/2015  . DDD (degenerative disc disease), lumbar 03/20/2015  . Osteoarthrosis, unspecified whether generalized or localized, involving lower leg 03/20/2015  . Spinal stenosis of lumbar region 03/20/2015  . Major depressive disorder, recurrent, severe with psychotic features ([place]) 03/19/2015  . Self inflicted neck laceration 01/02/2015  . Hypertension 01/02/2015     History reviewed. No pertinent surgical history.   Prior to Admission medications   Not on File     Allergies Patient has no known  allergies.   History reviewed. No pertinent family history.  Social History Social History   Tobacco Use  . Smoking status: Current Every Day Smoker    Years: 15.00  . Smokeless tobacco: Never Used  Substance Use Topics  . Alcohol use: Yes  . Drug use: No    Review of Systems  Constitutional:   No fever or chills.  ENT:   No sore throat. No rhinorrhea. Cardiovascular:   No chest pain or syncope. Respiratory:   No dyspnea or cough. Gastrointestinal:   Negative for abdominal pain, vomiting and diarrhea.  Musculoskeletal:   Negative for focal pain or swelling All other systems reviewed and are negative except as documented above in ROS and HPI.  ____________________________________________   PHYSICAL EXAM:  VITAL SIGNS: ED Triage Vitals  Enc Vitals Group     BP 02/18/19 0636 (!) 134/100     Pulse Rate 02/18/19 0636 100     Resp 02/18/19 0636 18     Temp 02/18/19 0636 98.4 F (36.9 C)     Temp Source 02/18/19 0636 Oral     SpO2 02/18/19 0636 98 %     Weight 02/18/19 0638 203 lb (92.1 kg)     Height 02/18/19 0638 5\' 10"  (1.778 m)     Head Circumference --      Peak Flow --      Pain Score 02/18/19 0638 0     Pain Loc --      Pain Edu? --      Excl. in [place]? --  Vital signs reviewed, nursing assessments reviewed. Exam limited by patient uncooperativeness  Constitutional:   Alert and oriented to person and place. Non-toxic appearance. Eyes:   Conjunctivae are normal. EOMI.  ENT      Head:   Normocephalic and atraumatic.          Neck:   No meningismus. Full ROM. Hematological/Lymphatic/Immunilogical:   No cervical lymphadenopathy. Cardiovascular:   RRR.  Cap refill less than 2 seconds. Respiratory:   Normal respiratory effort without tachypnea/retractions.  Musculoskeletal:   Normal range of motion in all extremities. No joint effusions.  No lower extremity tenderness.  No edema. Neurologic:   Slurred speech and language.  Agitated, combative,  threatening Motor grossly intact. No acute focal neurologic deficits are appreciated.  Skin:    Skin is warm, dry and intact. No rash noted.  No petechiae, purpura, or bullae.  ____________________________________________    LABS (pertinent positives/negatives) (all labs ordered are listed, but only abnormal results are displayed) Labs Reviewed  COMPREHENSIVE METABOLIC PANEL - Abnormal; Notable for the following components:      Result Value   Potassium 3.3 (*)    CO2 21 (*)    Glucose, Bld 110 (*)    BUN 5 (*)    Calcium 8.6 (*)    ALT 46 (*)    All other components within normal limits  ETHANOL - Abnormal; Notable for the following components:   Alcohol, Ethyl (B) 282 (*)    All other components within normal limits  ACETAMINOPHEN LEVEL - Abnormal; Notable for the following components:   Acetaminophen (Tylenol), Serum <10 (*)    All other components within normal limits  URINALYSIS, COMPLETE (UACMP) WITH MICROSCOPIC - Abnormal; Notable for the following components:   Color, Urine COLORLESS (*)    APPearance CLEAR (*)    Specific Gravity, Urine 1.001 (*)    Hgb urine dipstick SMALL (*)    All other components within normal limits  SALICYLATE LEVEL  CBC WITH DIFFERENTIAL/PLATELET  URINE DRUG SCREEN, QUALITATIVE ([facility] ONLY)   ____________________________________________     ____________________________________________    RADIOLOGY  No results found.  ____________________________________________   PROCEDURES .Critical Care Performed by: Carrie Mew, MD Authorized by: Carrie Mew, MD   Critical care provider statement:    Critical care time (minutes):  35   Critical care was necessary to treat or prevent imminent or life-threatening deterioration of the following conditions:  Toxidrome and CNS failure or compromise   Critical care was time spent personally by me on the following activities:  Discussions with consultants, evaluation of patient's  response to treatment, examination of patient, ordering and performing treatments and interventions, ordering and review of laboratory studies, ordering and review of radiographic studies, pulse oximetry, re-evaluation of patient's condition, obtaining history from patient or surrogate and review of old charts    ____________________________________________    CLINICAL IMPRESSION / ASSESSMENT AND PLAN / ED COURSE  Medications ordered in the ED: Medications  LORazepam (ATIVAN) injection 2 mg (2 mg Intramuscular Given 02/18/19 0758)  ziprasidone (GEODON) injection 10 mg (10 mg Intramuscular Given 02/18/19 0758)  diphenhydrAMINE (BENADRYL) injection 50 mg (50 mg Intramuscular Given 02/18/19 0758)    Pertinent labs & imaging results that were available during my care of the patient were reviewed by me and considered in my medical decision making (see chart for details).  Jesse Obrien was evaluated in Emergency Department on 02/18/2019 for the symptoms described in the history of present illness. He was evaluated in  the context of the global COVID-19 pandemic, which necessitated consideration that the patient might be at risk for infection with the SARS-CoV-2 virus that causes COVID-19. Institutional protocols and algorithms that pertain to the evaluation of patients at risk for COVID-19 are in a state of rapid change based on information released by regulatory bodies including the CDC and federal and state organizations. These policies and algorithms were followed during the patient's care in the ED.     Clinical Course as of Feb 17 1050  Mon Feb 18, 2019  0735 Patient presents with intoxication, combativeness.  He is uncooperative.  Has been off his medications for depression and has a history of suicidal behavior.  Will need to initiate involuntary commitment, give intramuscular Geodon, Benadryl, Ativan to calm the patient for safety of himself and others until he has metabolized his blood  alcohol and can be reassessed.   [PS]  4627 Patient calm.  Labs unremarkable except for alcohol level of 280.  Will await clinical sobriety for psych assessment and disposition.   [PS]    Clinical Course User Index [PS] Carrie Mew, MD     ____________________________________________   FINAL CLINICAL IMPRESSION(S) / ED DIAGNOSES    Final diagnoses:  Alcoholic intoxication with complication [facility])     ED Discharge Orders    None      Portions of this note were generated with dragon dictation software. Dictation errors may occur despite best attempts at proofreading.   Carrie Mew, MD 02/18/19 1056

## 2019-02-18 NOTE — ED Notes (Signed)
Snack and beverage given. 

## 2019-02-18 NOTE — ED Notes (Signed)
Patient when first arrived to the [facility] was cooperative and compliant with staff direction, but now is becoming visibly agitated and difficult to gain compliance with staff direction. Pt. Is visibly intoxicated and admits to drinking alcohol. Pt. Asked by this Probation officer and additional staff to change out into required hospital attire, but adamantly refuses and becomes more agitated and posturing towards staff. Will speak to treatment team regarding patient presentation and behavior.

## 2019-02-18 NOTE — ED Notes (Signed)
Pt dressed out into appropriate behavioral health clothing with this tech, Vet,EDT and Amy,RN in the rm. Pt belongings consist of a pair of blue jeans, white tennis shoes, red/blue plaid boxers, a blue shirt and a brown wallet with two five dollar bills and four one dollar bills. Pt belongings placed into a pt belongings bag and labeled with pt name. Pt calm and cooperative while dressing out.

## 2019-02-18 NOTE — ED Notes (Signed)
Hourly rounding reveals patient in room. No complaints, stable, in no acute distress. Q15 minute rounds and monitoring via Security Cameras to continue. 

## 2019-02-18 NOTE — ED Notes (Signed)
Attempted to administer patient his ordered medications, patient was asleep and would not take medications

## 2019-02-18 NOTE — ED Notes (Signed)
Patient assigned to appropriate care area   Introduced self to pt  Patient oriented to unit/care area: Informed that, for their safety, care areas are designed for safety and visiting and phone hours explained to patient. Patient verbalizes understanding, and verbal contract for safety obtained  Environment secured  

## 2019-02-19 DIAGNOSIS — F10230 Alcohol dependence with withdrawal, uncomplicated: Secondary | ICD-10-CM

## 2019-02-19 NOTE — ED Notes (Signed)
BEHAVIORAL HEALTH ROUNDING Patient sleeping: No. Patient alert and oriented: yes Behavior appropriate: Yes.  ; If no, describe:  Nutrition and fluids offered: yes Toileting and hygiene offered: Yes  Sitter present: q15 minute observations and security camera monitoring Law enforcement present: Yes  ODS  

## 2019-02-19 NOTE — ED Notes (Signed)
Hourly rounding reveals patient in room. No complaints, stable, in no acute distress. Q15 minute rounds and monitoring via Security Cameras to continue. 

## 2019-02-19 NOTE — ED Provider Notes (Signed)
-----------------------------------------   4:58 AM on 02/19/2019 -----------------------------------------   Blood pressure 129/87, pulse (!) 115, temperature 99.3 F (37.4 C), temperature source Oral, resp. rate 20, height 5\' 10"  (1.778 m), weight 92.1 kg, SpO2 95 %.  The patient had no acute events since last update.  Calm and cooperative at this time.  Disposition is pending per Psychiatry/Behavioral Medicine team recommendations.     Alfred Levins, [place], MD 02/19/19 [phone]

## 2019-02-19 NOTE — Discharge Instructions (Addendum)
You are cleared by the psychiatric team for discharge home.  Your wife is picking you up.  Return to the ER for any other concerns

## 2019-02-19 NOTE — ED Provider Notes (Signed)
Patient cleared by the psychiatric team for discharge home.  Patient's wife is going to come pick him up.   Vanessa [place], MD 02/19/19 1310

## 2019-02-19 NOTE — ED Notes (Signed)

## 2019-02-19 NOTE — Consult Note (Signed)
[facility] Face-to-Face Psychiatry Consult   Reason for Consult:  Alcohol dependence Referring Physician:  EDP Patient Identification: Jesse Obrien MRN:  [id number] Principal Diagnosis: Alcohol dependence with uncomplicated withdrawal (Jesse Obrien) Diagnosis:  Principal Problem:   Alcohol dependence with uncomplicated withdrawal (Jesse Obrien)   Total Time spent with patient: 30 minutes  Subjective:   Jesse Obrien is a 58 y.o. male patient reports that he was just drinking too much yesterday. He denies any suicidal or homicidal ideations and denies any hallucinations. He reports that he will be returning home with his wife and he gave permission to call her for collateral. He refuses substance abuse treatment options. He states that he just wants to return home.   Collateral gained from patient's wife, April Puccinelli, at [phone]. She reports that the patient is safe to discharge home. He has not made any suicidal or homicidal comments. She reports that he "must" have drank to much alcohol and he has done it before. She has no concerns at all with the patient being discharged. She also reports taht she will be the one picking the patient up.  HPI:  Per Dr. Joni Fears: 58 y.o. male with a history of hypertension, alcohol abuse, major depression, suicide attempt who comes the ED by EMS today after heavy drinking.  States that he wants help due to his alcohol abuse.  However, he is also verbally abusive, shouting at myself and other staff to "get the hell out of here."  Difficult to reorient the patient to the fact that he is in the emergency department and this is where we work and that we are here to help him. Reports that he is a binge drinker and does not drink every day but when he does he might drink a case or 2.  Denies shaking seizures or hallucinosis.  Denies hallucinations currently but is evasive about SI or HI.  Denies drug use.   Patient is seen by this provider face-to-face. The patient has been pleasant,  calm, and cooperative. He has a long history of alcohol abuse and has been to several substance abuse treatments. He stated that he doesn't have any withdrawal symptoms and never has. The patient at this time does not meet inpatient psychiatric treatment criteria and is psychiatrically cleared. I have rescinded the IVC and notified Dr. Nickolas Madrid of the recommendations.   Past Psychiatric History: depression and alcohol dependence  Risk to Self:   Risk to Others:   Prior Inpatient Therapy:   Prior Outpatient Therapy:    Past Medical History:  Past Medical History:  Diagnosis Date  . DJD (degenerative joint disease)   . Hypertension 01/02/2015   History reviewed. No pertinent surgical history. Family History: History reviewed. No pertinent family history. Family Psychiatric  History: None reported Social History:  Social History   Substance and Sexual Activity  Alcohol Use Yes     Social History   Substance and Sexual Activity  Drug Use No    Social History   Socioeconomic History  . Marital status: Married    Spouse name: Not on file  . Number of children: Not on file  . Years of education: Not on file  . Highest education level: Not on file  Occupational History  . Not on file  Social Needs  . Financial resource strain: Not on file  . Food insecurity    Worry: Not on file    Inability: Not on file  . Transportation needs    Medical: Not on file  Non-medical: Not on file  Tobacco Use  . Smoking status: Current Every Day Smoker    Years: 15.00  . Smokeless tobacco: Never Used  Substance and Sexual Activity  . Alcohol use: Yes  . Drug use: No  . Sexual activity: Yes    Birth control/protection: Condom  Lifestyle  . Physical activity    Days per week: Not on file    Minutes per session: Not on file  . Stress: Not on file  Relationships  . Social Herbalist on phone: Not on file    Gets together: Not on file    Attends religious service: Not on file     Active member of club or organization: Not on file    Attends meetings of clubs or organizations: Not on file    Relationship status: Not on file  Other Topics Concern  . Not on file  Social History Narrative  . Not on file   Additional Social History:    Allergies:  No Known Allergies  Labs:  Results for orders placed or performed during the hospital encounter of 02/18/19 (from the past 48 hour(s))  Comprehensive metabolic panel     Status: Abnormal   Collection Time: 02/18/19  6:43 AM  Result Value Ref Range   Sodium 135 135 - 145 mmol/L   Potassium 3.3 (L) 3.5 - 5.1 mmol/L   Chloride 104 98 - 111 mmol/L   CO2 21 (L) 22 - 32 mmol/L   Glucose, Bld 110 (H) 70 - 99 mg/dL   BUN 5 (L) 6 - 20 mg/dL   Creatinine, Ser 0.76 0.61 - 1.24 mg/dL   Calcium 8.6 (L) 8.9 - 10.3 mg/dL   Total Protein 7.8 6.5 - 8.1 g/dL   Albumin 4.2 3.5 - 5.0 g/dL   AST 41 15 - 41 U/L   ALT 46 (H) 0 - 44 U/L   Alkaline Phosphatase 71 38 - 126 U/L   Total Bilirubin 0.7 0.3 - 1.2 mg/dL   GFR calc non Af Amer >60 >60 mL/min   GFR calc Af Amer >60 >60 mL/min   Anion gap 10 5 - 15    Comment: Performed at [facility], [address], [place], [place] [address]  Ethanol     Status: Abnormal   Collection Time: 02/18/19  6:43 AM  Result Value Ref Range   Alcohol, Ethyl (B) 282 (H) <10 mg/dL    Comment: (NOTE) Lowest detectable limit for serum alcohol is 10 mg/dL. For medical purposes only. Performed at [facility], [address] [place], [place] [address]   Acetaminophen level     Status: Abnormal   Collection Time: 02/18/19  6:43 AM  Result Value Ref Range   Acetaminophen (Tylenol), Serum <10 (L) 10 - 30 ug/mL    Comment: (NOTE) Therapeutic concentrations vary significantly. A range of 10-30 ug/mL  may be an effective concentration for many patients. However, some  are best treated at concentrations outside of this range. Acetaminophen concentrations >150 ug/mL at 4 hours after  ingestion  and >50 ug/mL at 12 hours after ingestion are often associated with  toxic reactions. Performed at [facility], [address] [place], [place] [address]   Salicylate level     Status: None   Collection Time: 02/18/19  6:43 AM  Result Value Ref Range   Salicylate Lvl <2.5 2.8 - 30.0 mg/dL    Comment: Performed at [facility], [place],  [place] [address]  CBC with Differential     Status: None   Collection Time: 02/18/19  6:43 AM  Result Value Ref Range   WBC 10.1 4.0 - 10.5 K/uL   RBC 4.61 4.22 - 5.81 MIL/uL   Hemoglobin 15.3 13.0 - 17.0 g/dL   HCT 44.7 39.0 - 52.0 %   MCV 97.0 80.0 - 100.0 fL   MCH 33.2 26.0 - 34.0 pg   MCHC 34.2 30.0 - 36.0 g/dL   RDW 12.4 11.5 - 15.5 %   Platelets 178 150 - 400 K/uL   nRBC 0.0 0.0 - 0.2 %   Neutrophils Relative % 50 %   Neutro Abs 5.2 1.7 - 7.7 K/uL   Lymphocytes Relative 37 %   Lymphs Abs 3.8 0.7 - 4.0 K/uL   Monocytes Relative 8 %   Monocytes Absolute 0.8 0.1 - 1.0 K/uL   Eosinophils Relative 3 %   Eosinophils Absolute 0.3 0.0 - 0.5 K/uL   Basophils Relative 1 %   Basophils Absolute 0.1 0.0 - 0.1 K/uL   Immature Granulocytes 1 %   Abs Immature Granulocytes 0.07 0.00 - 0.07 K/uL    Comment: Performed at [facility], [address], [place], [place] [address]  Urine Drug Screen, Qualitative     Status: None   Collection Time: 02/18/19  6:53 AM  Result Value Ref Range   Tricyclic, Ur Screen NONE DETECTED NONE DETECTED   Amphetamines, Ur Screen NONE DETECTED NONE DETECTED   MDMA (Ecstasy)Ur Screen NONE DETECTED NONE DETECTED   Cocaine Metabolite,Ur [place] NONE DETECTED NONE DETECTED   Opiate, Ur Screen NONE DETECTED NONE DETECTED   Phencyclidine (PCP) Ur S NONE DETECTED NONE DETECTED   Cannabinoid 50 Ng, Ur [place] NONE DETECTED NONE DETECTED   Barbiturates, Ur Screen NONE DETECTED NONE DETECTED   Benzodiazepine, Ur Scrn NONE DETECTED NONE DETECTED   Methadone Scn, Ur NONE DETECTED  NONE DETECTED    Comment: (NOTE) Tricyclics + metabolites, urine    Cutoff 1000 ng/mL Amphetamines + metabolites, urine  Cutoff 1000 ng/mL MDMA (Ecstasy), urine              Cutoff 500 ng/mL Cocaine Metabolite, urine          Cutoff 300 ng/mL Opiate + metabolites, urine        Cutoff 300 ng/mL Phencyclidine (PCP), urine         Cutoff 25 ng/mL Cannabinoid, urine                 Cutoff 50 ng/mL Barbiturates + metabolites, urine  Cutoff 200 ng/mL Benzodiazepine, urine              Cutoff 200 ng/mL Methadone, urine                   Cutoff 300 ng/mL The urine drug screen provides only a preliminary, unconfirmed analytical test result and should not be used for non-medical purposes. Clinical consideration and professional judgment should be applied to any positive drug screen result due to possible interfering substances. A more specific alternate chemical method must be used in order to obtain a confirmed analytical result. Gas chromatography / mass spectrometry (GC/MS) is the preferred confirmat ory method. Performed at [facility], [address] [place], [place] [address]   Urinalysis, Complete w Microscopic     Status: Abnormal   Collection Time: 02/18/19  6:53 AM  Result Value Ref Range   Color, Urine COLORLESS (A) YELLOW  APPearance CLEAR (A) CLEAR   Specific Gravity, Urine 1.001 (L) 1.005 - 1.030   pH 6.0 5.0 - 8.0   Glucose, UA NEGATIVE NEGATIVE mg/dL   Hgb urine dipstick SMALL (A) NEGATIVE   Bilirubin Urine NEGATIVE NEGATIVE   Ketones, ur NEGATIVE NEGATIVE mg/dL   Protein, ur NEGATIVE NEGATIVE mg/dL   Nitrite NEGATIVE NEGATIVE   Leukocytes,Ua NEGATIVE NEGATIVE   WBC, UA NONE SEEN 0 - 5 WBC/hpf   Bacteria, UA NONE SEEN NONE SEEN   Squamous Epithelial / LPF NONE SEEN 0 - 5    Comment: Performed at [facility], [address] [place], [place] [address]    Current Facility-Administered Medications  Medication Dose Route Frequency Provider Last  Rate Last Dose  . hydrOXYzine (ATARAX/VISTARIL) tablet 25 mg  25 mg Oral Q6H PRN Patrecia Pour, NP   25 mg at 02/18/19 1958  . loperamide (IMODIUM) capsule 2-4 mg  2-4 mg Oral PRN Patrecia Pour, NP      . LORazepam (ATIVAN) tablet 1 mg  1 mg Oral Q6H PRN Patrecia Pour, NP      . LORazepam (ATIVAN) tablet 1 mg  1 mg Oral QID Patrecia Pour, NP   Stopped at 02/18/19 1515   Followed by  . LORazepam (ATIVAN) tablet 1 mg  1 mg Oral TID Patrecia Pour, NP       Followed by  . [START ON 02/20/2019] LORazepam (ATIVAN) tablet 1 mg  1 mg Oral BID Patrecia Pour, NP       Followed by  . [START ON 02/22/2019] LORazepam (ATIVAN) tablet 1 mg  1 mg Oral Daily Waylan Boga Y, NP      . multivitamin with minerals tablet 1 tablet  1 tablet Oral Daily Patrecia Pour, NP   Stopped at 02/18/19 1515  . ondansetron (ZOFRAN-ODT) disintegrating tablet 4 mg  4 mg Oral Q6H PRN Patrecia Pour, NP      . thiamine (B-1) injection 100 mg  100 mg Intramuscular Once Patrecia Pour, NP   Stopped at 02/18/19 1515  . thiamine (VITAMIN B-1) tablet 100 mg  100 mg Oral Daily Patrecia Pour, NP       No current outpatient medications on file.    Musculoskeletal: Strength & Muscle Tone: within normal limits Gait & Station: normal Patient leans: N/A  Psychiatric Specialty Exam: Physical Exam  Nursing note and vitals reviewed. Constitutional: He is oriented to person, place, and time. He appears well-developed and well-nourished.  Cardiovascular: Normal rate.  Respiratory: Effort normal.  Musculoskeletal: Normal range of motion.  Neurological: He is alert and oriented to person, place, and time.    Review of Systems  Constitutional: Negative.   HENT: Negative.   Eyes: Negative.   Respiratory: Negative.   Cardiovascular: Negative.   Gastrointestinal: Negative.   Genitourinary: Negative.   Musculoskeletal: Negative.   Skin: Negative.   Neurological: Negative.   Endo/Heme/Allergies: Negative.    Psychiatric/Behavioral: Positive for substance abuse.    Blood pressure 137/82, pulse 92, temperature 98.4 F (36.9 C), temperature source Oral, resp. rate 18, height 5\' 10"  (1.778 m), weight 92.1 kg, SpO2 98 %.Body mass index is 29.13 kg/m.  General Appearance: Casual  Eye Contact:  Good  Speech:  Clear and Coherent and Normal Rate  Volume:  Normal  Mood:  Euthymic  Affect:  Congruent  Thought Process:  Coherent and Descriptions of Associations: Intact  Orientation:  Full (Time, Place, and Person)  Thought Content:  WDL  Suicidal Thoughts:  No  Homicidal Thoughts:  No  Memory:  Immediate;   Good Recent;   Good Remote;   Good  Judgement:  Fair  Insight:  Fair  Psychomotor Activity:  Normal  Concentration:  Concentration: Good and Attention Span: Good  Recall:  Good  Fund of Knowledge:  Good  Language:  Good  Akathisia:  No  Handed:  Right  AIMS (if indicated):     Assets:  Communication Skills Desire for Improvement Financial Resources/Insurance Housing Resilience Social Support Transportation  ADL's:  Intact  Cognition:  WNL  Sleep:        Treatment Plan Summary: Recommend substance abuse treatment  Return home Follow up with outpatinet provider  Disposition: No evidence of imminent risk to self or others at present.   Patient does not meet criteria for psychiatric inpatient admission. Supportive therapy provided about ongoing stressors. Discussed crisis plan, support from social network, calling 911, coming to the Emergency Department, and calling Suicide Hotline.  [place], FNP 02/19/2019 12:14 PM

## 2019-02-19 NOTE — ED Notes (Signed)
ED BHU [place] Is the patient under IVC or is there intent for IVC: Yes.   Is the patient medically cleared: Yes.   Is there vacancy in the ED BHU: Yes.   Is the population mix appropriate for patient: Yes.   Is the patient awaiting placement in inpatient or outpatient setting: Yes.   Has the patient had a psychiatric consult: Yes.   Survey of unit performed for contraband, proper placement and condition of furniture, tampering with fixtures in bathroom, shower, and each patient room: Yes.  ; Findings:  APPEARANCE/BEHAVIOR Calm and cooperative NEURO ASSESSMENT Orientation: oriented x3  Denies pain Hallucinations: No.None noted (Hallucinations)  Denies  Speech: Normal Gait: normal RESPIRATORY ASSESSMENT Even  Unlabored respirations  CARDIOVASCULAR ASSESSMENT Pulses equal   regular rate  Skin warm and dry   GASTROINTESTINAL ASSESSMENT no GI complaint EXTREMITIES Full ROM  PLAN OF CARE Provide calm/safe environment. Vital signs assessed twice daily. ED BHU Assessment once each 12-hour shift.  Assure the ED provider has rounded once each shift. Provide and encourage hygiene. Provide redirection as needed. Assess for escalating behavior; address immediately and inform ED provider.  Assess family dynamic and appropriateness for visitation as needed: Yes.  ; If necessary, describe findings:  Educate the patient/family about BHU procedures/visitation: Yes.  ; If necessary, describe findings:

## 2019-02-19 NOTE — ED Notes (Signed)
E signature strip not working at time of discharge

## 2019-02-19 NOTE — ED Notes (Signed)
Patient observed lying in bed with eyes closed  Even, unlabored respirations observed   NAD pt appears to be sleeping  I will continue to monitor along with every 15 minute visual observations and ongoing security camera monitoring    

## 2019-02-26 ENCOUNTER — Other Ambulatory Visit: Payer: Self-pay

## 2019-02-26 ENCOUNTER — Encounter: Payer: Self-pay | Admitting: Emergency Medicine

## 2019-02-26 ENCOUNTER — Emergency Department
Admission: EM | Admit: 2019-02-26 | Discharge: 2019-02-26 | Disposition: A | Discharge status: Home / Self Care | Attending: Emergency Medicine | Admitting: Emergency Medicine

## 2019-02-26 DIAGNOSIS — I10 Essential (primary) hypertension: Secondary | ICD-10-CM | POA: Diagnosis not present

## 2019-02-26 DIAGNOSIS — F10920 Alcohol use, unspecified with intoxication, uncomplicated: Secondary | ICD-10-CM

## 2019-02-26 DIAGNOSIS — F1010 Alcohol abuse, uncomplicated: Secondary | ICD-10-CM | POA: Diagnosis present

## 2019-02-26 DIAGNOSIS — R45851 Suicidal ideations: Secondary | ICD-10-CM | POA: Diagnosis not present

## 2019-02-26 DIAGNOSIS — Y908 Blood alcohol level of 240 mg/100 ml or more: Secondary | ICD-10-CM | POA: Diagnosis not present

## 2019-02-26 DIAGNOSIS — F17210 Nicotine dependence, cigarettes, uncomplicated: Secondary | ICD-10-CM | POA: Insufficient documentation

## 2019-02-26 DIAGNOSIS — F329 Major depressive disorder, single episode, unspecified: Secondary | ICD-10-CM | POA: Diagnosis not present

## 2019-02-26 LAB — URINE DRUG SCREEN, QUALITATIVE (ARMC ONLY)
Amphetamines, Ur Screen: NOT DETECTED
Barbiturates, Ur Screen: NOT DETECTED
Benzodiazepine, Ur Scrn: NOT DETECTED
Cannabinoid 50 Ng, Ur ~~LOC~~: NOT DETECTED
Cocaine Metabolite,Ur ~~LOC~~: NOT DETECTED
MDMA (Ecstasy)Ur Screen: NOT DETECTED
Methadone Scn, Ur: NOT DETECTED
Opiate, Ur Screen: NOT DETECTED
Phencyclidine (PCP) Ur S: NOT DETECTED
Tricyclic, Ur Screen: NOT DETECTED

## 2019-02-26 LAB — URINALYSIS, COMPLETE (UACMP) WITH MICROSCOPIC
Bacteria, UA: NONE SEEN
Bilirubin Urine: NEGATIVE
Glucose, UA: NEGATIVE mg/dL
Ketones, ur: NEGATIVE mg/dL
Leukocytes,Ua: NEGATIVE
Nitrite: NEGATIVE
Protein, ur: NEGATIVE mg/dL
Specific Gravity, Urine: 1.002 — ABNORMAL LOW (ref 1.005–1.030)
Squamous Epithelial / HPF: NONE SEEN (ref 0–5)
WBC, UA: NONE SEEN WBC/hpf (ref 0–5)
pH: 6 (ref 5.0–8.0)

## 2019-02-26 LAB — CBC
HCT: 46.1 % (ref 39.0–52.0)
Hemoglobin: 15.9 g/dL (ref 13.0–17.0)
MCH: 33.5 pg (ref 26.0–34.0)
MCHC: 34.5 g/dL (ref 30.0–36.0)
MCV: 97.1 fL (ref 80.0–100.0)
Platelets: 192 10*3/uL (ref 150–400)
RBC: 4.75 MIL/uL (ref 4.22–5.81)
RDW: 12.5 % (ref 11.5–15.5)
WBC: 9.9 10*3/uL (ref 4.0–10.5)
nRBC: 0 % (ref 0.0–0.2)

## 2019-02-26 LAB — BASIC METABOLIC PANEL
Anion gap: 11 (ref 5–15)
BUN: 6 mg/dL (ref 6–20)
CO2: 21 mmol/L — ABNORMAL LOW (ref 22–32)
Calcium: 8.6 mg/dL — ABNORMAL LOW (ref 8.9–10.3)
Chloride: 106 mmol/L (ref 98–111)
Creatinine, Ser: 0.76 mg/dL (ref 0.61–1.24)
GFR calc Af Amer: >60 mL/min (ref >60)
GFR calc non Af Amer: >60 mL/min (ref >60)
Glucose, Bld: 107 mg/dL — ABNORMAL HIGH (ref 70–99)
Potassium: 3.7 mmol/L (ref 3.5–5.1)
Sodium: 138 mmol/L (ref 135–145)

## 2019-02-26 LAB — ETHANOL: Alcohol, Ethyl (B): 296 mg/dL — ABNORMAL HIGH (ref <10)

## 2019-02-26 LAB — ACETAMINOPHEN LEVEL: Acetaminophen (Tylenol), Serum: <10 ug/mL — ABNORMAL LOW (ref 10–30)

## 2019-02-26 LAB — SALICYLATE LEVEL: Salicylate Lvl: <7 mg/dL (ref 2.8–30.0)

## 2019-02-26 NOTE — ED Notes (Signed)
Pt dresses out at this time and moved into room   PT belongings include: jeans, boxers, tshirt, and shoes.

## 2019-02-26 NOTE — ED Notes (Signed)
Pt sleeping heavily at this time. PT awoke by this RN, states he drove himself here to hospital. RN informed pt that he will need a ride to be discharged. Pt returned to sleeping

## 2019-02-26 NOTE — Consult Note (Signed)
[facility] Consult   Reason for Consult:  ETOH abuse and SI Referring Physician:  EDP Patient Identification: Jesse Obrien MRN:  [id number] Principal Diagnosis: <principal problem not specified> Diagnosis:  Active Problems:   * No active hospital problems. *   Total Time spent with patient: 30 minutes  Subjective:   Jesse Obrien is a 58 y.o. male patient reports that yesterday he drank approximately 1 case of beer and became upset.  Patient reports today that he is not suicidal or homicidal denies any hallucinations.  Patient further states that he wants to get some help but when offered facility for residential treatment or for detox he refuses those.  Patient states that he does not feel that he needs to be admitted to the hospital either.  Patient states that he would like to have resources for follow-up treatment.  HPI:  Per Dr. Kerman Passey:  58 y.o. male with a past medical history of arthritis, hypertension, alcohol abuse, presents to the emergency department for suicidal ideation and self-mutilation.  According to the patient he has had worsening depression, last night he began thinking of killing himself.  Patient states he had razor blade and was going to cut his neck.  Patient has a very superficial cut across his skin over his neck.  Patient does admit to alcohol use last night.  Denies any drug use.  Denies any medical complaints with a largely negative review systems including negative for fever cough congestion or shortness of breath.  Patient is seen by this provider face-to-face.  Patient is lying in the bed resting with his eyes closed but is easily aroused.  Patient denies any suicidal or homicidal ideations and denies any hallucinations.  Patient has been seen by this provider before and presents intoxicated making threats but when he become sober he denies all of his comments.  Patient has been provided with multiple resources in the past and is encouraged to  go to follow-up appointments for assistance.  At this time the patient does not meet inpatient criteria and is psychiatrically cleared.  I have rescinded the patient's IVC and I will notify Dr. Kerman Passey about the recommendations.  Past Psychiatric History: ETOH abuse, depression  Risk to Self: Suicidal Ideation: No Suicidal Intent: No Is patient at risk for suicide?: No Suicidal Plan?: No Access to Means: No What has been your use of drugs/alcohol within the last 12 months?: Alcohol How many times?: 0 Other Self Harm Risks: Active Alcohol abuse Triggers for Past Attempts: Spouse contact, Other personal contacts, Other (Comment) Intentional Self Injurious Behavior: None Risk to Others: Homicidal Ideation: No Thoughts of Harm to Others: No Current Homicidal Intent: No Current Homicidal Plan: No Access to Homicidal Means: No Identified Victim: Reports of none History of harm to others?: No Assessment of Violence: None Noted Violent Behavior Description: Reports of none Does patient have access to weapons?: No Criminal Charges Pending?: No Does patient have a court date: No Prior Inpatient Therapy:   Prior Outpatient Therapy:    Past Medical History:  Past Medical History:  Diagnosis Date  . DJD (degenerative joint disease)   . Hypertension 01/02/2015   History reviewed. No pertinent surgical history. Family History: No family history on file. Family Psychiatric  History: None reported Social History:  Social History   Substance and Sexual Activity  Alcohol Use Yes     Social History   Substance and Sexual Activity  Drug Use No    Social History   Socioeconomic  History  . Marital status: Married    Spouse name: Not on file  . Number of children: Not on file  . Years of education: Not on file  . Highest education level: Not on file  Occupational History  . Not on file  Social Needs  . Financial resource strain: Not on file  . Food insecurity    Worry: Not on  file    Inability: Not on file  . Transportation needs    Medical: Not on file    Non-medical: Not on file  Tobacco Use  . Smoking status: Current Every Day Smoker    Years: 15.00  . Smokeless tobacco: Never Used  Substance and Sexual Activity  . Alcohol use: Yes  . Drug use: No  . Sexual activity: Yes    Birth control/protection: Condom  Lifestyle  . Physical activity    Days per week: Not on file    Minutes per session: Not on file  . Stress: Not on file  Relationships  . Social Herbalist on phone: Not on file    Gets together: Not on file    Attends religious service: Not on file    Active member of club or organization: Not on file    Attends meetings of clubs or organizations: Not on file    Relationship status: Not on file  Other Topics Concern  . Not on file  Social History Narrative  . Not on file   Additional Social History:    Allergies:  No Known Allergies  Labs:  Results for orders placed or performed during the hospital encounter of 02/26/19 (from the past 48 hour(s))  CBC     Status: None   Collection Time: 02/26/19  7:25 AM  Result Value Ref Range   WBC 9.9 4.0 - 10.5 K/uL   RBC 4.75 4.22 - 5.81 MIL/uL   Hemoglobin 15.9 13.0 - 17.0 g/dL   HCT 46.1 39.0 - 52.0 %   MCV 97.1 80.0 - 100.0 fL   MCH 33.5 26.0 - 34.0 pg   MCHC 34.5 30.0 - 36.0 g/dL   RDW 12.5 11.5 - 15.5 %   Platelets 192 150 - 400 K/uL   nRBC 0.0 0.0 - 0.2 %    Comment: Performed at [facility], [address] [place], [place] [id number]  Basic metabolic panel     Status: Abnormal   Collection Time: 02/26/19  7:25 AM  Result Value Ref Range   Sodium 138 135 - 145 mmol/L   Potassium 3.7 3.5 - 5.1 mmol/L   Chloride 106 98 - 111 mmol/L   CO2 21 (L) 22 - 32 mmol/L   Glucose, Bld 107 (H) 70 - 99 mg/dL   BUN 6 6 - 20 mg/dL   Creatinine, Ser 0.76 0.61 - 1.24 mg/dL   Calcium 8.6 (L) 8.9 - 10.3 mg/dL   GFR calc non Af Amer >60 >60 mL/min   GFR calc Af Amer >60 >60  mL/min   Anion gap 11 5 - 15    Comment: Performed at [facility], [address] [place], [place] [address]  Ethanol     Status: Abnormal   Collection Time: 02/26/19  7:25 AM  Result Value Ref Range   Alcohol, Ethyl (B) 296 (H) <10 mg/dL    Comment: (NOTE) Lowest detectable limit for serum alcohol is 10 mg/dL. For medical purposes only. Performed at [facility], [address], [place], [place] [address]  Acetaminophen level     Status: Abnormal   Collection Time: 02/26/19  7:25 AM  Result Value Ref Range   Acetaminophen (Tylenol), Serum <10 (L) 10 - 30 ug/mL    Comment: (NOTE) Therapeutic concentrations vary significantly. A range of 10-30 ug/mL  may be an effective concentration for many patients. However, some  are best treated at concentrations outside of this range. Acetaminophen concentrations >150 ug/mL at 4 hours after ingestion  and >50 ug/mL at 12 hours after ingestion are often associated with  toxic reactions. Performed at [facility], [address] [place], [place] [id number]   Salicylate level     Status: None   Collection Time: 02/26/19  7:25 AM  Result Value Ref Range   Salicylate Lvl [id number] 2.8 - 30.0 mg/dL    Comment: Performed at [facility], [address] [place], [place] [address]  Urine Drug Screen, Qualitative ([facility] only)     Status: None   Collection Time: 02/26/19  7:30 AM  Result Value Ref Range   Tricyclic, Ur Screen NONE DETECTED NONE DETECTED   Amphetamines, Ur Screen NONE DETECTED NONE DETECTED   MDMA (Ecstasy)Ur Screen NONE DETECTED NONE DETECTED   Cocaine Metabolite,Ur [place] NONE DETECTED NONE DETECTED   Opiate, Ur Screen NONE DETECTED NONE DETECTED   Phencyclidine (PCP) Ur S NONE DETECTED NONE DETECTED   Cannabinoid 50 Ng, Ur [place] NONE DETECTED NONE DETECTED   Barbiturates, Ur Screen NONE DETECTED NONE DETECTED   Benzodiazepine, Ur Scrn NONE DETECTED NONE DETECTED   Methadone Scn, Ur NONE DETECTED  NONE DETECTED    Comment: (NOTE) Tricyclics + metabolites, urine    Cutoff 1000 ng/mL Amphetamines + metabolites, urine  Cutoff 1000 ng/mL MDMA (Ecstasy), urine              Cutoff 500 ng/mL Cocaine Metabolite, urine          Cutoff 300 ng/mL Opiate + metabolites, urine        Cutoff 300 ng/mL Phencyclidine (PCP), urine         Cutoff 25 ng/mL Cannabinoid, urine                 Cutoff 50 ng/mL Barbiturates + metabolites, urine  Cutoff 200 ng/mL Benzodiazepine, urine              Cutoff 200 ng/mL Methadone, urine                   Cutoff 300 ng/mL The urine drug screen provides only a preliminary, unconfirmed analytical test result and should not be used for non-medical purposes. Clinical consideration and professional judgment should be applied to any positive drug screen result due to possible interfering substances. A more specific alternate chemical method must be used in order to obtain a confirmed analytical result. Gas chromatography / mass spectrometry (GC/MS) is the preferred confirmat ory method. Performed at [facility], [address] [place], [place] [address]   Urinalysis, Complete w Microscopic     Status: Abnormal   Collection Time: 02/26/19  7:30 AM  Result Value Ref Range   Color, Urine COLORLESS (A) YELLOW   APPearance CLEAR (A) CLEAR   Specific Gravity, Urine 1.002 (L) 1.005 - 1.030   pH 6.0 5.0 - 8.0   Glucose, UA NEGATIVE NEGATIVE mg/dL   Hgb urine dipstick SMALL (A) NEGATIVE   Bilirubin Urine NEGATIVE NEGATIVE   Ketones, ur NEGATIVE NEGATIVE mg/dL   Protein, ur NEGATIVE NEGATIVE mg/dL   Nitrite  NEGATIVE NEGATIVE   Leukocytes,Ua NEGATIVE NEGATIVE   WBC, UA NONE SEEN 0 - 5 WBC/hpf   Bacteria, UA NONE SEEN NONE SEEN   Squamous Epithelial / LPF NONE SEEN 0 - 5    Comment: Performed at [facility], [address] [place], [place] [address]    No current facility-administered medications for this encounter.    No current outpatient  medications on file.    Musculoskeletal: Strength & Muscle Tone: within normal limits Gait & Station: normal Patient leans: N/A  Psychiatric Specialty Exam: Physical Exam  Nursing note and vitals reviewed. Constitutional: He is oriented to person, place, and time. He appears well-developed and well-nourished.  Cardiovascular: Normal rate.  Respiratory: Effort normal.  Musculoskeletal: Normal range of motion.  Neurological: He is alert and oriented to person, place, and time.    Review of Systems  Constitutional: Negative.   HENT: Negative.   Eyes: Negative.   Respiratory: Negative.   Cardiovascular: Negative.   Gastrointestinal: Negative.   Genitourinary: Negative.   Musculoskeletal: Negative.   Skin: Negative.   Neurological: Negative.   Endo/Heme/Allergies: Negative.   Psychiatric/Behavioral: Positive for depression and hallucinations.    Blood pressure (!) 150/96, pulse 99, temperature 97.9 F (36.6 C), temperature source Oral, resp. rate 20, SpO2 95 %.There is no height or weight on file to calculate BMI.  General Appearance: Casual  Eye Contact:  Good  Speech:  Clear and Coherent and Normal Rate  Volume:  Decreased  Mood:  Depressed  Affect:  Congruent  Thought Process:  Coherent and Descriptions of Associations: Intact  Orientation:  Full (Time, Place, and Person)  Thought Content:  WDL  Suicidal Thoughts:  No  Homicidal Thoughts:  No  Memory:  Immediate;   Good Recent;   Good Remote;   Good  Judgement:  Fair  Insight:  Lacking  Psychomotor Activity:  Normal  Concentration:  Concentration: Good  Recall:  Good  Fund of Knowledge:  Good  Language:  Good  Akathisia:  No  Handed:  Right  AIMS (if indicated):     Assets:  Communication Skills Desire for Improvement Financial Resources/Insurance Housing Resilience Social Support Transportation  ADL's:  Intact  Cognition:  WNL  Sleep:        Treatment Plan Summary: Follow up with outpatient  provider  Provide outpatient resources Cessation of alcohol use Seek counseling for depression and or alcohol abuse  Disposition: No evidence of imminent risk to self or others at present.   Patient does not meet criteria for psychiatric inpatient admission. Discussed crisis plan, support from social network, calling 911, coming to the Emergency Department, and calling Suicide Hotline.  [place], FNP 02/26/2019 11:01 AM

## 2019-02-26 NOTE — ED Notes (Signed)
Patient is resting comfortably. 

## 2019-02-26 NOTE — ED Provider Notes (Signed)
[facility] Emergency Department Provider Note  Time seen: 7:26 AM  I have reviewed the triage vital signs and the nursing notes.   HISTORY  Chief Complaint Alcohol Problem  HPI Jesse Obrien is a 58 y.o. male with a past medical history of arthritis, hypertension, alcohol abuse, presents to the emergency department for suicidal ideation and self-mutilation.  According to the patient he has had worsening depression, last night he began thinking of killing himself.  Patient states he had razor blade and was going to cut his neck.  Patient has a very superficial cut across his skin over his neck.  Patient does admit to alcohol use last night.  Denies any drug use.  Denies any medical complaints with a largely negative review systems including negative for fever cough congestion or shortness of breath.  Past Medical History:  Diagnosis Date  . DJD (degenerative joint disease)   . Hypertension 01/02/2015    Patient Active Problem List   Diagnosis Date Noted  . Suicidal behavior 10/28/2015  . Noncompliance 10/28/2015  . Tobacco use disorder 10/28/2015  . Substance induced mood disorder ([place]) 10/28/2015  . GERD (gastroesophageal reflux disease) 10/28/2015  . Alcohol dependence with uncomplicated withdrawal ([place]) 03/20/2015  . DDD (degenerative disc disease), lumbar 03/20/2015  . Osteoarthrosis, unspecified whether generalized or localized, involving lower leg 03/20/2015  . Spinal stenosis of lumbar region 03/20/2015  . 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 Self inflicted neck laceration 01/02/2015  . Hypertension 01/02/2015    History reviewed. No pertinent surgical history.  Prior to Admission medications   Not on File    No Known Allergies  No family history on file.  Social History Social History   Tobacco Use  . Smoking status: Current Every Day Smoker    Years: 15.00  . Smokeless tobacco: Never  Used  Substance Use Topics  . Alcohol use: Yes  . Drug use: No    Review of Systems Constitutional: Negative for fever. Cardiovascular: Negative for chest pain. Respiratory: Negative for shortness of breath. Gastrointestinal: Negative for abdominal pain, vomiting  Musculoskeletal: Negative for musculoskeletal complaints Skin: Negative for skin complaints  Neurological: Negative for headache All other ROS negative  ____________________________________________   PHYSICAL EXAM:  VITAL SIGNS: ED Triage Vitals [02/26/19 0703]  Enc Vitals Group     BP (!) 150/96     Pulse Rate 99     Resp 20     Temp 97.9 F (36.6 C)     Temp Source Oral     SpO2 95 %     Weight      Height      Head Circumference      Peak Flow      Pain Score 0     Pain Loc      Pain Edu?      Excl. in [place]?     Constitutional: Alert and oriented. Well appearing and in no distress. Eyes: Normal exam ENT      Head: Normocephalic and atraumatic      Mouth/Throat: Mucous membranes are moist. Cardiovascular: Normal rate, regular rhythm.  Respiratory: Normal respiratory effort without tachypnea nor retractions. Breath sounds are clear Gastrointestinal: Soft and nontender. No distention. Musculoskeletal: Nontender with normal range of motion in all extremities.  Neurologic:  Normal speech and language. No gross focal neurologic deficits Skin: Abrasion to neck.  Psychiatric: Mood and affect are normal.   ____________________________________________   INITIAL IMPRESSION /  ASSESSMENT AND PLAN / ED COURSE  Pertinent labs & imaging results that were available during my care of the patient were reviewed by me and considered in my medical decision making (see chart for details).   Patient presents to the emergency department for alcohol abuse and suicidal ideation.  Patient made superficial cuts to his neck last night as well.  Given his self-mutilation with alcohol abuse history and suicidal ideation I have  placed the patient under an involuntary commitment.  We will have psychiatry TTS evaluate.  Currently patient appears well.  Superficial cut to the neck does not require any further medical care.  Patient has been seen by psychiatry.  They rescinded the patient's IVC and believe the patient is safe for discharge home from a psychiatric standpoint.  Patient's medical work-up has been largely nonrevealing besides an elevated alcohol level of 296.  We will attempt to have the patient find a ride home.  If he is unable we will monitor until sober and then discharge.  Jesse Obrien was evaluated in Emergency Department on 02/26/2019 for the symptoms described in the history of present illness. He was evaluated in the context of the global COVID-19 pandemic, which necessitated consideration that the patient might be at risk for infection with the SARS-CoV-2 virus that causes COVID-19. Institutional protocols and algorithms that pertain to the evaluation of patients at risk for COVID-19 are in a state of rapid change based on information released by regulatory bodies including the CDC and federal and state organizations. These policies and algorithms were followed during the patient's care in the ED.  ____________________________________________   FINAL CLINICAL IMPRESSION(S) / ED DIAGNOSES  Suicidal ideation Alcohol abuse Self-mutilation Alcohol intoxication.   Harvest Dark, MD 02/26/19 1042

## 2019-02-26 NOTE — BH Assessment (Signed)
Assessment Note  Jesse Obrien is an 58 y.o. male who presents to the ER seeking assistance for his alcohol use. Patient also reports of having depression and denies SI/HI and AV/H. Patient states he want helps for his alcohol use but when offered to coordinate him going to a detox facility or outpatient treatment, patient decline. He request to be left alone.  During the interview, patient was calm and appeared to be sleep but was able to engaged. Patient is well known to the ER for similar presentation. When he presents to the ER intoxicated he states he is going to self harm or wants help but when he becomes sober, he denies SI and request to go home. Patient was last seen in the ER 02/18/2019 for similar complaint.  Throughout the interview, the patient denied SI/HI and AV/H.  Diagnosis: Alcohol Use Disorder  Past Medical History:  Past Medical History:  Diagnosis Date  . DJD (degenerative joint disease)   . Hypertension 01/02/2015    History reviewed. No pertinent surgical history.  Family History: No family history on file.  Social History:  reports that he has been smoking. He has smoked for the past 15.00 years. He has never used smokeless tobacco. He reports current alcohol use. He reports that he does not use drugs.  Additional Social History:  Alcohol / Drug Use Pain Medications: See PTA Prescriptions: See PTA Over the Counter: See PTA History of alcohol / drug use?: Yes Longest period of sobriety (when/how long): Unable to quantify Negative Consequences of Use: Personal relationships, Work / School Substance #1 Name of Substance 1: Alcohol 1 - Age of First Use: unknown 1 - Amount (size/oz): Unable to quantify 1 - Frequency: Daily 1 - Duration: Unable to quantify 1 - Last Use / Amount: 02/26/2019  CIWA: CIWA-Ar BP: (!) 150/96 Pulse Rate: 99 COWS:    Allergies: No Known Allergies  Home Medications: (Not in a hospital admission)   OB/GYN Status:  No LMP for  male patient.  General Assessment Data Location of Assessment: [facility] ED TTS Assessment: In system Is this a Tele or Face-to-Face Assessment?: Face-to-Face Is this an Initial Assessment or a Re-assessment for this encounter?: Initial Assessment Language Other than English: No Living Arrangements: Other (Comment)(Private Home) What gender do you identify as?: Male Marital status: Married Pregnancy Status: No Living Arrangements: Spouse/significant other Can pt return to current living arrangement?: Yes Admission Status: Involuntary Petitioner: ED Attending Is patient capable of signing voluntary admission?: No(Under IVC) Referral Source: Self/Family/Friend Insurance type: Restaurant manager, fast food Exam ([place]) Medical Exam completed: Yes  Crisis Care Plan Living Arrangements: Spouse/significant other Legal Guardian: Other:(Self) Name of Psychiatrist: Reports of none Name of Therapist: Reports of none  Education Status Is patient currently in school?: No Is the patient employed, unemployed or receiving disability?: Unemployed  Risk to self with the past 6 months Suicidal Ideation: No Has patient been a risk to self within the past 6 months prior to admission? : No Suicidal Intent: No Has patient had any suicidal intent within the past 6 months prior to admission? : No Is patient at risk for suicide?: No Suicidal Plan?: No Has patient had any suicidal plan within the past 6 months prior to admission? : No Access to Means: No What has been your use of drugs/alcohol within the last 12 months?: Alcohol Previous Attempts/Gestures: No How many times?: 0 Other Self Harm Risks: Active Alcohol abuse Triggers for Past Attempts: Spouse contact, Other personal contacts,  Other (Comment) Intentional Self Injurious Behavior: None Family Suicide History: No Recent stressful life event(s): Other (Comment) Persecutory voices/beliefs?: No Depression: Yes Depression Symptoms:  Feeling angry/irritable, Isolating Substance abuse history and/or treatment for substance abuse?: Yes Suicide prevention information given to non-admitted patients: Not applicable  Risk to Others within the past 6 months Homicidal Ideation: No Does patient have any lifetime risk of violence toward others beyond the six months prior to admission? : No Thoughts of Harm to Others: No Current Homicidal Intent: No Current Homicidal Plan: No Access to Homicidal Means: No Identified Victim: Reports of none History of harm to others?: No Assessment of Violence: None Noted Violent Behavior Description: Reports of none Does patient have access to weapons?: No Criminal Charges Pending?: No Does patient have a court date: No Is patient on probation?: No  Psychosis Hallucinations: None noted Delusions: None noted  Mental Status Report Appearance/Hygiene: Unremarkable, In scrubs Eye Contact: Good Motor Activity: Freedom of movement, Unremarkable Speech: Logical/coherent, Unremarkable Level of Consciousness: Alert Mood: Sad, Pleasant Affect: Appropriate to circumstance Anxiety Level: Minimal Thought Processes: Coherent, Relevant Judgement: Unimpaired Orientation: Person, Place, Time, Situation, Appropriate for developmental age Obsessive Compulsive Thoughts/Behaviors: None  Cognitive Functioning Concentration: Normal Memory: Recent Intact, Remote Intact Is patient IDD: No Insight: Fair Impulse Control: Fair Appetite: Good Have you had any weight changes? : No Change Sleep: No Change Total Hours of Sleep: 8 Vegetative Symptoms: None  ADLScreening [facility] Assessment Services) Patient's cognitive ability adequate to safely complete daily activities?: Yes Patient able to express need for assistance with ADLs?: Yes Independently performs ADLs?: Yes (appropriate for developmental age)  Prior Inpatient Therapy Prior Inpatient Therapy: No  Prior Outpatient Therapy Prior Outpatient  Therapy: No Does patient have an ACCT team?: Yes Does patient have Intensive In-House Services?  : No Does patient have [facility] services? : No Does patient have P4CC services?: No  ADL Screening (condition at time of admission) Patient's cognitive ability adequate to safely complete daily activities?: Yes Is the patient deaf or have difficulty hearing?: No Does the patient have difficulty seeing, even when wearing glasses/contacts?: No Does the patient have difficulty concentrating, remembering, or making decisions?: No Patient able to express need for assistance with ADLs?: Yes Does the patient have difficulty dressing or bathing?: No Independently performs ADLs?: Yes (appropriate for developmental age) Does the patient have difficulty walking or climbing stairs?: No Weakness of Legs: None Weakness of Arms/Hands: None  Home Assistive Devices/Equipment Home Assistive Devices/Equipment: None  Therapy Consults (therapy consults require a physician order) PT Evaluation Needed: No OT Evalulation Needed: No SLP Evaluation Needed: No Abuse/Neglect Assessment (Assessment to be complete while patient is alone) Abuse/Neglect Assessment Can Be Completed: Yes Physical Abuse: Denies Verbal Abuse: Denies Sexual Abuse: Denies Exploitation of patient/patient's resources: Denies Self-Neglect: Denies Values / Beliefs Cultural Requests During Hospitalization: None Spiritual Requests During Hospitalization: None Consults Spiritual Care Consult Needed: No Social Work Consult Needed: No Regulatory affairs officer (For Healthcare) Does Patient Have a Medical Advance Directive?: No Would patient like information on creating a medical advance directive?: No - Patient declined       Child/Adolescent Assessment Running Away Risk: Denies(Patient is an adult)  Disposition:  Disposition Initial Assessment Completed for this Encounter: Yes  On Site Evaluation by:   Reviewed with Physician:    Gunnar Fusi MS, LCAS, [facility], [place], [place] Therapeutic Triage Specialist 02/26/2019 11:07 AM

## 2019-02-26 NOTE — ED Notes (Signed)
IVC  RESCINDED  PER  TRAVIS  NP  INFORMED  [place]  RN

## 2019-02-26 NOTE — ED Notes (Signed)
Attempted to call patient's spouse, April, with his permission but received no answer.  Will continue to attempt.

## 2019-02-26 NOTE — ED Notes (Signed)
Patient is requesting to leave. Nurse spoke to patient and addressed same.

## 2019-02-26 NOTE — ED Notes (Addendum)
Wife called back to hospital.  This RN spoke to wife on the phone.  Wife is working and will be available to pick patient up when she gets off at 3pm.

## 2019-02-26 NOTE — ED Notes (Signed)
IVC PENDING  CONSULT ?

## 2019-02-26 NOTE — ED Notes (Signed)
Coffee given to pt per request at this time

## 2019-02-26 NOTE — ED Notes (Signed)
TTS and psych at bedside.  

## 2019-02-26 NOTE — ED Triage Notes (Signed)
Patient to ER for c/o "I need help because I drink too much.". Patient reports drinking 12 pack every 3 days. States "I binge drink.". Patient denies having seizures when stopping drinking.

## 2019-02-26 NOTE — ED Notes (Signed)
Patient is sleeping  

## 2019-05-09 ENCOUNTER — Emergency Department
Admission: EM | Admit: 2019-05-09 | Discharge: 2019-05-09 | Disposition: A | Discharge status: Home / Self Care | Attending: Emergency Medicine | Admitting: Emergency Medicine

## 2019-05-09 ENCOUNTER — Encounter: Payer: Self-pay | Admitting: Emergency Medicine

## 2019-05-09 ENCOUNTER — Other Ambulatory Visit: Payer: Self-pay

## 2019-05-09 DIAGNOSIS — Y908 Blood alcohol level of 240 mg/100 ml or more: Secondary | ICD-10-CM | POA: Diagnosis not present

## 2019-05-09 DIAGNOSIS — F17200 Nicotine dependence, unspecified, uncomplicated: Secondary | ICD-10-CM | POA: Diagnosis not present

## 2019-05-09 DIAGNOSIS — F10920 Alcohol use, unspecified with intoxication, uncomplicated: Secondary | ICD-10-CM

## 2019-05-09 DIAGNOSIS — I10 Essential (primary) hypertension: Secondary | ICD-10-CM | POA: Insufficient documentation

## 2019-05-09 DIAGNOSIS — F10120 Alcohol abuse with intoxication, uncomplicated: Secondary | ICD-10-CM | POA: Insufficient documentation

## 2019-05-09 LAB — URINE DRUG SCREEN, QUALITATIVE (ARMC ONLY)
Amphetamines, Ur Screen: NOT DETECTED
Barbiturates, Ur Screen: NOT DETECTED
Benzodiazepine, Ur Scrn: NOT DETECTED
Cannabinoid 50 Ng, Ur ~~LOC~~: NOT DETECTED
Cocaine Metabolite,Ur ~~LOC~~: NOT DETECTED
MDMA (Ecstasy)Ur Screen: NOT DETECTED
Methadone Scn, Ur: NOT DETECTED
Opiate, Ur Screen: NOT DETECTED
Phencyclidine (PCP) Ur S: NOT DETECTED
Tricyclic, Ur Screen: NOT DETECTED

## 2019-05-09 LAB — CBC
HCT: 46.5 % (ref 39.0–52.0)
Hemoglobin: 16.1 g/dL (ref 13.0–17.0)
MCH: 33 pg (ref 26.0–34.0)
MCHC: 34.6 g/dL (ref 30.0–36.0)
MCV: 95.3 fL (ref 80.0–100.0)
Platelets: 200 10*3/uL (ref 150–400)
RBC: 4.88 MIL/uL (ref 4.22–5.81)
RDW: 12.3 % (ref 11.5–15.5)
WBC: 8.5 10*3/uL (ref 4.0–10.5)
nRBC: 0 % (ref 0.0–0.2)

## 2019-05-09 LAB — COMPREHENSIVE METABOLIC PANEL
ALT: 37 U/L (ref 0–44)
AST: 35 U/L (ref 15–41)
Albumin: 4.4 g/dL (ref 3.5–5.0)
Alkaline Phosphatase: 71 U/L (ref 38–126)
Anion gap: 12 (ref 5–15)
BUN: 6 mg/dL (ref 6–20)
CO2: 21 mmol/L — ABNORMAL LOW (ref 22–32)
Calcium: 8.3 mg/dL — ABNORMAL LOW (ref 8.9–10.3)
Chloride: 103 mmol/L (ref 98–111)
Creatinine, Ser: 0.79 mg/dL (ref 0.61–1.24)
GFR calc Af Amer: >60 mL/min (ref >60)
GFR calc non Af Amer: >60 mL/min (ref >60)
Glucose, Bld: 111 mg/dL — ABNORMAL HIGH (ref 70–99)
Potassium: 3.5 mmol/L (ref 3.5–5.1)
Sodium: 136 mmol/L (ref 135–145)
Total Bilirubin: 0.6 mg/dL (ref 0.3–1.2)
Total Protein: 8 g/dL (ref 6.5–8.1)

## 2019-05-09 LAB — ETHANOL: Alcohol, Ethyl (B): 356 mg/dL (ref <10)

## 2019-05-09 NOTE — ED Notes (Signed)
VOL/DRUG/ALCOHOL ASSESSMENT

## 2019-05-09 NOTE — ED Notes (Signed)
Pt ate food in breakfast tray. Pt threw away shoe.

## 2019-05-09 NOTE — BH Assessment (Signed)
TTS consult wasn't complete. Patient discharge prior to writer speaking with him.

## 2019-05-09 NOTE — ED Triage Notes (Signed)
Pt to triage via w/c with no distress noted, brought in by [employer] as requested by pt for detox of alcohol

## 2019-05-09 NOTE — ED Notes (Signed)
Pt to be discharged. RN called pt's wife who will be here to pick him up in 30 minutes.

## 2019-05-09 NOTE — ED Notes (Signed)
Pt discharged home with wife. VS stable. Pt denies SI. All belongings returned to patient. Pt signed for discharged, but refused to take discharge instructions home.

## 2019-05-09 NOTE — ED Provider Notes (Signed)
[facility] Emergency Department Provider Note   ____________________________________________    I have reviewed the triage vital signs and the nursing notes.   HISTORY  Chief Complaint Drug / Alcohol Assessment     HPI Jesse Obrien is a 58 y.o. male with history as noted below who reports that he binge drinks alcohol.  He reports that he does not feel shaky when he does not have alcohol, no history of seizures or altered mental status from alcohol withdrawal.  He is here because he binge drank alcohol last night, is requesting detox.  Denies SI or HI to me.  No physical complaints  Past Medical History:  Diagnosis Date  . DJD (degenerative joint disease)   . Hypertension 01/02/2015    Patient Active Problem List   Diagnosis Date Noted  . Suicidal behavior 10/28/2015  . Noncompliance 10/28/2015  . Tobacco use disorder 10/28/2015  . Substance induced mood disorder ([place]) 10/28/2015  . GERD (gastroesophageal reflux disease) 10/28/2015  . Alcohol dependence with uncomplicated withdrawal ([place]) 03/20/2015  . DDD (degenerative disc disease), lumbar 03/20/2015  . Osteoarthrosis, unspecified whether generalized or localized, involving lower leg 03/20/2015  . Spinal stenosis of lumbar region 03/20/2015  . 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 Self inflicted neck laceration 01/02/2015  . Hypertension 01/02/2015    History reviewed. No pertinent surgical history.  Prior to Admission medications   Not on File     Allergies Patient has no known allergies.  No family history on file.  Social History Social History   Tobacco Use  . Smoking status: Current Every Day Smoker    Years: 15.00  . Smokeless tobacco: Never Used  Substance Use Topics  . Alcohol use: Yes  . Drug use: No    Review of Systems  Constitutional: No tremors Eyes: No visual changes.  ENT: No neck pain Cardiovascular:  Denies chest pain. Respiratory: Denies shortness of breath. Gastrointestinal: No abdominal pain.  No nausea, no vomiting.   Genitourinary: Negative for dysuria. Musculoskeletal: Negative for back pain. Skin: Negative for rash. Neurological: Negative for headaches    ____________________________________________   PHYSICAL EXAM:  VITAL SIGNS: ED Triage Vitals  Enc Vitals Group     BP 05/09/19 0642 (!) 145/100     Pulse Rate 05/09/19 0642 94     Resp 05/09/19 0642 20     Temp 05/09/19 0642 98.2 F (36.8 C)     Temp Source 05/09/19 0642 Oral     SpO2 05/09/19 0642 98 %     Weight 05/09/19 0643 90.7 kg (200 lb)     Height 05/09/19 0643 1.778 m (5\' 10" )     Head Circumference --      Peak Flow --      Pain Score 05/09/19 0643 0     Pain Loc --      Pain Edu? --      Excl. in [place]? --     Constitutional: Alert and oriented.   Nose: No congestion/rhinnorhea. Mouth/Throat: Mucous membranes are moist.   Neck:  Painless ROM Cardiovascular: Normal rate, regular rhythm. Grossly normal heart sounds.  Good peripheral circulation. Respiratory: Normal respiratory effort.  No retractions. Lungs CTAB. Gastrointestinal: Soft and nontender. No distention.  No CVA tenderness.  Musculoskeletal: .  Warm and well perfused Neurologic:  Normal speech and language. No gross focal neurologic deficits are appreciated.  Skin:  Skin is warm, dry and intact. No rash noted.  Psychiatric: Mood and affect are normal. Speech and behavior are normal.  ____________________________________________   LABS (all labs ordered are listed, but only abnormal results are displayed)  Labs Reviewed  COMPREHENSIVE METABOLIC PANEL - Abnormal; Notable for the following components:      Result Value   CO2 21 (*)    Glucose, Bld 111 (*)    Calcium 8.3 (*)    All other components within normal limits  ETHANOL - Abnormal; Notable for the following components:   Alcohol, Ethyl (B) 356 (*)    All other components  within normal limits  CBC  URINE DRUG SCREEN, QUALITATIVE ([facility] ONLY)   ____________________________________________  EKG  None ____________________________________________  RADIOLOGY  None ____________________________________________   PROCEDURES  Procedure(s) performed: No  Procedures   Critical Care performed: No ____________________________________________   INITIAL IMPRESSION / ASSESSMENT AND PLAN / ED COURSE  Pertinent labs & imaging results that were available during my care of the patient were reviewed by me and considered in my medical decision making (see chart for details).  Patient well-appearing in no acute distress, admits to being mildly intoxicated.  No SI or HI.  Will have TTS see patient for detox program referral  ----------------------------------------- 9:24 AM on 05/09/2019 -----------------------------------------  Patient's lab work is overall reassuring, elevated alcohol as expected.  However now the patient states that he does not want to wait any longer for detox placement, he would like to leave.  He has no SI or HI and he has decisional capacity.  His wife will come pick him up    ____________________________________________   FINAL CLINICAL IMPRESSION(S) / ED DIAGNOSES  Final diagnoses:  Alcoholic intoxication without complication ([place])        Note:  This document was prepared using Dragon voice recognition software and may include unintentional dictation errors.   Lavonia Drafts, MD 05/09/19 [phone]

## 2019-06-06 ENCOUNTER — Other Ambulatory Visit: Payer: Self-pay

## 2019-06-06 ENCOUNTER — Emergency Department

## 2019-06-06 ENCOUNTER — Emergency Department
Admission: EM | Admit: 2019-06-06 | Discharge: 2019-06-06 | Disposition: A | Discharge status: Home / Self Care | Attending: Emergency Medicine | Admitting: Emergency Medicine

## 2019-06-06 DIAGNOSIS — F17200 Nicotine dependence, unspecified, uncomplicated: Secondary | ICD-10-CM | POA: Insufficient documentation

## 2019-06-06 DIAGNOSIS — M545 Low back pain, unspecified: Secondary | ICD-10-CM

## 2019-06-06 DIAGNOSIS — I10 Essential (primary) hypertension: Secondary | ICD-10-CM | POA: Insufficient documentation

## 2019-06-06 DIAGNOSIS — F10920 Alcohol use, unspecified with intoxication, uncomplicated: Secondary | ICD-10-CM | POA: Insufficient documentation

## 2019-06-06 NOTE — ED Triage Notes (Signed)
Pt to ED via EMS from home. Per ems pt drank 24 pack of beer tonight and fell. Pt c/o lower back pain. Pt a&o x4. Pt denies hitting head during fall tonight.

## 2019-06-06 NOTE — ED Provider Notes (Signed)
[facility] Emergency Department Provider Note   ____________________________________________   I have reviewed the triage vital signs and the nursing notes.   HISTORY  Chief Complaint Low back pain  History limited by: Not Limited   HPI Jesse Obrien is a 58 y.o. male who presents to the emergency department today because of concern for low back pain. Patient states that he was drinking alcohol and fell. Started having pain in his lower back. Describes it as sharp. Says he does have a history of previous back injury. Denies hitting his head. Does have a history of SI but denies any current SI.    Records reviewed. Per medical record review patient has a history of frequent ER visits for alcohol use disorder. Suicidal ideation.   Past Medical History:  Diagnosis Date  . DJD (degenerative joint disease)   . Hypertension 01/02/2015    Patient Active Problem List   Diagnosis Date Noted  . Suicidal behavior 10/28/2015  . Noncompliance 10/28/2015  . Tobacco use disorder 10/28/2015  . Substance induced mood disorder ([place]) 10/28/2015  . GERD (gastroesophageal reflux disease) 10/28/2015  . Alcohol dependence with uncomplicated withdrawal ([place]) 03/20/2015  . DDD (degenerative disc disease), lumbar 03/20/2015  . Osteoarthrosis, unspecified whether generalized or localized, involving lower leg 03/20/2015  . Spinal stenosis of lumbar region 03/20/2015  . 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 Self inflicted neck laceration 01/02/2015  . Hypertension 01/02/2015    History reviewed. No pertinent surgical history.  Prior to Admission medications   Not on File    Allergies Patient has no known allergies.  No family history on file.  Social History Social History   Tobacco Use  . Smoking status: Current Every Day Smoker    Years: 15.00  . Smokeless tobacco: Never Used  Substance Use Topics  . Alcohol  use: Yes  . Drug use: No    Review of Systems Constitutional: No fever/chills Eyes: No visual changes. ENT: No sore throat. Cardiovascular: Denies chest pain. Respiratory: Denies shortness of breath. Gastrointestinal: No abdominal pain.  No nausea, no vomiting.  No diarrhea.   Genitourinary: Negative for dysuria. Musculoskeletal: Positive for low back pain. Skin: Negative for rash. Neurological: Negative for headaches, focal weakness or numbness.  ____________________________________________   PHYSICAL EXAM:  VITAL SIGNS: ED Triage Vitals  Enc Vitals Group     BP 06/06/19 0156 (!) 150/98     Pulse Rate 06/06/19 0153 93     Resp 06/06/19 0153 16     Temp 06/06/19 0153 98.2 F (36.8 C)     Temp Source 06/06/19 0153 Oral     SpO2 06/06/19 0153 98 %     Weight 06/06/19 0153 199 lb 15.3 oz (90.7 kg)     Height 06/06/19 0153 5\' 10"  (1.778 m)     Head Circumference --      Peak Flow --      Pain Score 06/06/19 0153 7   Constitutional: Alert and oriented.  Eyes: Conjunctivae are normal.  ENT      Head: Normocephalic and atraumatic.      Nose: No congestion/rhinnorhea.      Mouth/Throat: Mucous membranes are moist.      Neck: No stridor. No midline tenderness.  Hematological/Lymphatic/Immunilogical: No cervical lymphadenopathy. Cardiovascular: Normal rate, regular rhythm.  No murmurs, rubs, or gallops. Respiratory: Normal respiratory effort without tachypnea nor retractions. Breath sounds are clear and equal bilaterally. No wheezes/rales/rhonchi. Gastrointestinal: Soft and non  tender. No rebound. No guarding.  Genitourinary: Deferred Musculoskeletal: Normal range of motion in all extremities. No lower extremity edema. Neurologic:  Slightly intoxicated. No gross focal neurologic deficits are appreciated.  Skin:  Skin is warm, dry and intact. No rash noted. Psychiatric: Mood and affect are normal. Speech and behavior are normal. Patient exhibits appropriate insight and  judgment.  ____________________________________________    LABS (pertinent positives/negatives)  None  ____________________________________________   EKG  None  ____________________________________________    RADIOLOGY  Lumbar Spine No acute abnormality  ____________________________________________   PROCEDURES  Procedures  ____________________________________________   INITIAL IMPRESSION / ASSESSMENT AND PLAN / ED COURSE  Pertinent labs & imaging results that were available during my care of the patient were reviewed by me and considered in my medical decision making (see chart for details).   Patient presents to the emergency department today because of concerns for low back pain after a fall.  Patient does admit to drinking alcohol tonight.  On exam patient did not have any midline tenderness however given concern for pain did obtain an x-ray. This did not show any acute osseous abnormality. Patient denies hitting his head and no hematomas or lacerations identified.   Will discharge home to follow-up with PCP and will give patient outpatient follow-up information.   ____________________________________________   FINAL CLINICAL IMPRESSION(S) / ED DIAGNOSES  Final diagnoses:  Alcoholic intoxication without complication ([place])  Midline low back pain without sciatica, unspecified chronicity     Note: This dictation was prepared with Dragon dictation. Any transcriptional errors that result from this process are unintentional     Nance Pear, MD 06/06/19 [phone]

## 2019-06-06 NOTE — ED Notes (Signed)
Family called to come pick up pt

## 2019-06-06 NOTE — Discharge Instructions (Addendum)
Please seek medical attention for any high fevers, chest pain, shortness of breath, change in behavior, persistent vomiting, bloody stool or any other new or concerning symptoms.  

## 2019-06-24 ENCOUNTER — Encounter: Payer: Self-pay | Admitting: Emergency Medicine

## 2019-06-24 ENCOUNTER — Other Ambulatory Visit: Payer: Self-pay

## 2019-06-24 ENCOUNTER — Emergency Department
Admission: EM | Admit: 2019-06-24 | Discharge: 2019-06-24 | Disposition: A | Discharge status: Home / Self Care | Attending: Emergency Medicine | Admitting: Emergency Medicine

## 2019-06-24 DIAGNOSIS — I10 Essential (primary) hypertension: Secondary | ICD-10-CM | POA: Insufficient documentation

## 2019-06-24 DIAGNOSIS — F329 Major depressive disorder, single episode, unspecified: Secondary | ICD-10-CM | POA: Diagnosis not present

## 2019-06-24 DIAGNOSIS — F17210 Nicotine dependence, cigarettes, uncomplicated: Secondary | ICD-10-CM | POA: Insufficient documentation

## 2019-06-24 DIAGNOSIS — F32A Depression, unspecified: Secondary | ICD-10-CM

## 2019-06-24 DIAGNOSIS — Z1339 Encounter for screening examination for other mental health and behavioral disorders: Secondary | ICD-10-CM | POA: Diagnosis present

## 2019-06-24 DIAGNOSIS — F1024 Alcohol dependence with alcohol-induced mood disorder: Secondary | ICD-10-CM | POA: Insufficient documentation

## 2019-06-24 HISTORY — DX: Alcohol abuse, uncomplicated: F10.10

## 2019-06-24 LAB — BASIC METABOLIC PANEL
Anion gap: 8 (ref 5–15)
BUN: 10 mg/dL (ref 6–20)
CO2: 24 mmol/L (ref 22–32)
Calcium: 8.7 mg/dL — ABNORMAL LOW (ref 8.9–10.3)
Chloride: 103 mmol/L (ref 98–111)
Creatinine, Ser: 0.86 mg/dL (ref 0.61–1.24)
GFR calc Af Amer: >60 mL/min (ref >60)
GFR calc non Af Amer: >60 mL/min (ref >60)
Glucose, Bld: 96 mg/dL (ref 70–99)
Potassium: 3.6 mmol/L (ref 3.5–5.1)
Sodium: 135 mmol/L (ref 135–145)

## 2019-06-24 MED ORDER — PANTOPRAZOLE SODIUM 40 MG PO TBEC: 40.0000 mg | DELAYED_RELEASE_TABLET | Freq: Every day | ORAL | 2 refills | Status: AC

## 2019-06-24 MED ORDER — MELOXICAM 15 MG PO TABS: 15.0000 mg | ORAL_TABLET | Freq: Every day | ORAL | 2 refills | Status: AC

## 2019-06-24 MED ORDER — CHLORDIAZEPOXIDE HCL 5 MG PO CAPS: ORAL_CAPSULE | ORAL | 0 refills | Status: DC

## 2019-06-24 MED ORDER — CITALOPRAM HYDROBROMIDE 20 MG PO TABS: 20.0000 mg | ORAL_TABLET | Freq: Every day | ORAL | 2 refills | Status: AC

## 2019-06-24 MED ORDER — TRAZODONE HCL 150 MG PO TABS: 150.0000 mg | ORAL_TABLET | Freq: Every day | ORAL | 2 refills | Status: DC

## 2019-06-24 NOTE — ED Provider Notes (Signed)
[facility] Emergency Department Provider Note  ____________________________________________   First MD Initiated Contact with Patient 06/24/19 [phone]     (approximate)  I have reviewed the triage vital signs and the nursing notes.   HISTORY  Chief Complaint Detox    HPI Jesse Obrien is a 58 y.o. male with a long-term history of alcohol abuse and who was previously been treated for alcohol abuse and depression as well as alcohol associated mood disorder.  He presents tonight requesting to be back on his psychiatric medication as well as help with his alcohol abuse.  He does not want to go to a treatment facility.  He says that he formally took gabapentin, some medicine for his mood which was apparently Celexa according medical record, and several other medications.  He has been on a Librium taper before to help him curb his alcohol abuse and he wants to try to decrease.  He says he has a number of issues going on with his family and he does not have any suicidal ideation or homicidal ideation but he knows he needs to cut back on his drinking.  He has not had complicated alcohol withdrawal in the past.  He denies fever/chills, sore throat, chest pain, shortness of breath, nausea, vomiting, and abdominal pain.  He has chronic pain syndrome for many years.  He has poor dentition and has plans to see a dentist now that he has dental insurance.  He said everything is just too much and he needs to be back on his medicine but he stated multiple times that he is not want hurt himself or anything else.  His symptoms are chronic, gradually worsening over time, and severe, nothing particular makes them better or worse.         Past Medical History:  Diagnosis Date  . Alcohol abuse   . DJD (degenerative joint disease)   . Hypertension 01/02/2015    Patient Active Problem List   Diagnosis Date Noted  . Suicidal behavior 10/28/2015  . Noncompliance 10/28/2015  . Tobacco  use disorder 10/28/2015  . Substance induced mood disorder ([place]) 10/28/2015  . GERD (gastroesophageal reflux disease) 10/28/2015  . Alcohol dependence with uncomplicated withdrawal ([place]) 03/20/2015  . DDD (degenerative disc disease), lumbar 03/20/2015  . Osteoarthrosis, unspecified whether generalized or localized, involving lower leg 03/20/2015  . Spinal stenosis of lumbar region 03/20/2015  . Major depressive disorder, recurrent, severe with psychotic features ([place]) 03/19/2015  . Self inflicted neck laceration 01/02/2015  . Hypertension 01/02/2015    History reviewed. No pertinent surgical history.  Prior to Admission medications   Medication Sig Start Date End Date Taking? Authorizing Provider  chlordiazePOXIDE (LIBRIUM) 5 MG capsule Take 6 caps (30 mg) 4x daily on day 1. Take 5 caps (25 mg) 4x daily on day 2. Take 4 caps (20 mg) 4x daily on day 3. Take 3 caps (15 mg) 4x daily on day 4. Take 2 caps (10 mg) 4x daily on day 5. Take 2 caps (10 mg) 3x daily on day 6. Take 1 cap (5 mg) 3x daily on day 7. Take 1 cap (5 mg) twice daily on day 8. Take 1 cap (5 mg) in the evening on day 9. 06/24/19   Hinda Kehr, MD  citalopram (CELEXA) 20 MG tablet Take 1 tablet (20 mg total) by mouth daily. 06/24/19 06/23/20  Hinda Kehr, MD  meloxicam (MOBIC) 15 MG tablet Take 1 tablet (15 mg total) by mouth daily. 06/24/19 06/23/20  Hinda Kehr, MD  pantoprazole (PROTONIX) 40 MG tablet Take 1 tablet (40 mg total) by mouth daily. 06/24/19   Hinda Kehr, MD  traZODone (DESYREL) 150 MG tablet Take 1 tablet (150 mg total) by mouth at bedtime. 06/24/19   Hinda Kehr, MD    Allergies Patient has no known allergies.  History reviewed. No pertinent family history.  Social History Social History   Tobacco Use  . Smoking status: Current Every Day Smoker    Years: 15.00  . Smokeless tobacco: Never Used  Substance Use Topics  . Alcohol use: Yes  . Drug use: No    Review of Systems Constitutional:  No fever/chills Eyes: No visual changes. ENT: No sore throat.  Poor dentition. Cardiovascular: Denies chest pain. Respiratory: Denies shortness of breath. Gastrointestinal: No abdominal pain.  No nausea, no vomiting.  No diarrhea.  No constipation. Genitourinary: Negative for dysuria. Musculoskeletal: Negative for neck pain.  Negative for back pain. Integumentary: Negative for rash. Neurological: Chronic pain syndrome.  Negative for headaches, focal weakness or numbness. Psych: Chronic alcohol abuse.  Denies SI/HI.   ____________________________________________   PHYSICAL EXAM:  VITAL SIGNS: ED Triage Vitals  Enc Vitals Group     BP 06/24/19 0429 (!) 148/104     Pulse Rate 06/24/19 0429 94     Resp 06/24/19 0429 20     Temp 06/24/19 0429 99.1 F (37.3 C)     Temp Source 06/24/19 0429 Oral     SpO2 06/24/19 0429 98 %     Weight 06/24/19 0429 90.7 kg (200 lb)     Height 06/24/19 0429 1.778 m (5\' 10" )     Head Circumference --      Peak Flow --      Pain Score 06/24/19 0432 0     Pain Loc --      Pain Edu? --      Excl. in [place]? --     Constitutional: Alert and oriented.  No acute distress.  Clinically sober.  Reports that he last drank beer about 8 hours prior to my assessment. Eyes: Conjunctivae are normal.  Head: Atraumatic. Nose: No congestion/rhinnorhea. Mouth/Throat: Chronically poor dentition with no evidence of acute abscess or infection. Neck: No stridor.  No meningeal signs.   Cardiovascular: Normal rate, regular rhythm. Good peripheral circulation. Grossly normal heart sounds. Respiratory: Normal respiratory effort.  No retractions. Gastrointestinal: Soft and nontender. No distention.  Musculoskeletal: No lower extremity tenderness nor edema. No gross deformities of extremities. Neurologic: No tremor.  Normal speech and language. No gross focal neurologic deficits are appreciated.  Skin:  Skin is warm, dry and intact. Psychiatric: Mood and affect are normal.  Speech and behavior are normal.  Patient denies SI/HI, admits to alcohol dependence.  ____________________________________________   LABS (all labs ordered are listed, but only abnormal results are displayed)  Labs Reviewed  BASIC METABOLIC PANEL - Abnormal; Notable for the following components:      Result Value   Calcium 8.7 (*)    All other components within normal limits  CBC   ____________________________________________  EKG  No indication for emergent EKG ____________________________________________  RADIOLOGY I, Hinda Kehr, personally viewed and evaluated these images (plain radiographs) as part of my medical decision making, as well as reviewing the written report by the radiologist.  ED MD interpretation: No indication for emergent imaging  Official radiology report(s): No results found.  ____________________________________________   PROCEDURES   Procedure(s) performed (including Critical Care):  Procedures   ____________________________________________  INITIAL IMPRESSION / MDM / ASSESSMENT AND PLAN / ED COURSE  As part of my medical decision making, I reviewed the following data within the [facility] notes reviewed and incorporated, Labs reviewed , Old chart reviewed, Notes from prior ED visits and [place] Controlled Substance Database   Differential diagnosis includes, but is not limited to, alcohol dependence, substance-induced mood disorder, depression.  The patient is not endorsing suicidal ideation or homicidal ideation and does not appear to represent a danger to himself or anyone else, and he appears to have the capacity to make his own decisions.  His basic metabolic panel is normal.  I reviewed the medical record including his psychiatric admission for about 3 years ago and verified the medications that he was on at the time.  He has access to the [facility] in [place].  I agreed to refill some of his medications as listed  below and encouraged him to follow-up either at the [facility] in [place] or at [facility] or [facility] for additional management of his chronic issues.  He understands and agrees with the plan.  I gave my usual customary return precautions.          ____________________________________________  FINAL CLINICAL IMPRESSION(S) / ED DIAGNOSES  Final diagnoses:  Alcohol dependence with alcohol-induced mood disorder (HCC)  Depression, unspecified depression type     MEDICATIONS GIVEN DURING THIS VISIT:  Medications - No data to display   ED Discharge Orders         Ordered    citalopram (CELEXA) 20 MG tablet  Daily     06/24/19 0721    meloxicam (MOBIC) 15 MG tablet  Daily     06/24/19 0721    pantoprazole (PROTONIX) 40 MG tablet  Daily     06/24/19 0721    traZODone (DESYREL) 150 MG tablet  Daily at bedtime     06/24/19 0721    chlordiazePOXIDE (LIBRIUM) 5 MG capsule     06/24/19 [id number]          *Please note:  WAVERLY THIMM was evaluated in Emergency Department on 06/24/2019 for the symptoms described in the history of present illness. He was evaluated in the context of the global COVID-19 pandemic, which necessitated consideration that the patient might be at risk for infection with the SARS-CoV-2 virus that causes COVID-19. Institutional protocols and algorithms that pertain to the evaluation of patients at risk for COVID-19 are in a state of rapid change based on information released by regulatory bodies including the CDC and federal and state organizations. These policies and algorithms were followed during the patient's care in the ED.  Some ED evaluations and interventions may be delayed as a result of limited staffing during the pandemic.*  Note:  This document was prepared using Dragon voice recognition software and may include unintentional dictation errors.   Hinda Kehr, MD 06/24/19 [phone]

## 2019-06-24 NOTE — ED Triage Notes (Signed)
Patient reports here seeking help with alcohol.  Reports he usually drinks 30, 12oz beer every 3 days.

## 2019-09-05 ENCOUNTER — Emergency Department

## 2019-09-05 ENCOUNTER — Other Ambulatory Visit: Payer: Self-pay

## 2019-09-05 ENCOUNTER — Emergency Department
Admission: EM | Admit: 2019-09-05 | Discharge: 2019-09-05 | Disposition: A | Discharge status: Home / Self Care | Attending: Emergency Medicine | Admitting: Emergency Medicine

## 2019-09-05 DIAGNOSIS — I10 Essential (primary) hypertension: Secondary | ICD-10-CM | POA: Diagnosis not present

## 2019-09-05 DIAGNOSIS — R079 Chest pain, unspecified: Secondary | ICD-10-CM

## 2019-09-05 DIAGNOSIS — F17210 Nicotine dependence, cigarettes, uncomplicated: Secondary | ICD-10-CM | POA: Diagnosis not present

## 2019-09-05 DIAGNOSIS — Z20822 Contact with and (suspected) exposure to covid-19: Secondary | ICD-10-CM | POA: Insufficient documentation

## 2019-09-05 DIAGNOSIS — Z79899 Other long term (current) drug therapy: Secondary | ICD-10-CM | POA: Diagnosis not present

## 2019-09-05 DIAGNOSIS — M7021 Olecranon bursitis, right elbow: Secondary | ICD-10-CM | POA: Diagnosis not present

## 2019-09-05 DIAGNOSIS — Y939 Activity, unspecified: Secondary | ICD-10-CM | POA: Insufficient documentation

## 2019-09-05 LAB — CBC
HCT: 43 % (ref 39.0–52.0)
Hemoglobin: 14.7 g/dL (ref 13.0–17.0)
MCH: 32.9 pg (ref 26.0–34.0)
MCHC: 34.2 g/dL (ref 30.0–36.0)
MCV: 96.2 fL (ref 80.0–100.0)
Platelets: 210 10*3/uL (ref 150–400)
RBC: 4.47 MIL/uL (ref 4.22–5.81)
RDW: 12.2 % (ref 11.5–15.5)
WBC: 8.6 10*3/uL (ref 4.0–10.5)
nRBC: 0 % (ref 0.0–0.2)

## 2019-09-05 LAB — COMPREHENSIVE METABOLIC PANEL
ALT: 25 U/L (ref 0–44)
AST: 25 U/L (ref 15–41)
Albumin: 4 g/dL (ref 3.5–5.0)
Alkaline Phosphatase: 72 U/L (ref 38–126)
Anion gap: 10 (ref 5–15)
BUN: 8 mg/dL (ref 6–20)
CO2: 22 mmol/L (ref 22–32)
Calcium: 8.8 mg/dL — ABNORMAL LOW (ref 8.9–10.3)
Chloride: 108 mmol/L (ref 98–111)
Creatinine, Ser: 0.71 mg/dL (ref 0.61–1.24)
GFR calc Af Amer: >60 mL/min (ref >60)
GFR calc non Af Amer: >60 mL/min (ref >60)
Glucose, Bld: 104 mg/dL — ABNORMAL HIGH (ref 70–99)
Potassium: 3.5 mmol/L (ref 3.5–5.1)
Sodium: 140 mmol/L (ref 135–145)
Total Bilirubin: 0.6 mg/dL (ref 0.3–1.2)
Total Protein: 7.5 g/dL (ref 6.5–8.1)

## 2019-09-05 LAB — ETHANOL: Alcohol, Ethyl (B): 189 mg/dL — ABNORMAL HIGH (ref <10)

## 2019-09-05 LAB — TROPONIN I (HIGH SENSITIVITY)
Troponin I (High Sensitivity): 7 ng/L (ref <18)
Troponin I (High Sensitivity): 8 ng/L (ref <18)

## 2019-09-05 LAB — SARS CORONAVIRUS 2 (TAT 6-24 HRS): SARS Coronavirus 2: NEGATIVE

## 2019-09-05 MED ORDER — IBUPROFEN 400 MG PO TABS: 400.0000 mg | ORAL_TABLET | Freq: Three times a day (TID) | ORAL | 0 refills | Status: AC | PRN

## 2019-09-05 NOTE — ED Triage Notes (Signed)
Pt in with co left sided chest pain for few months, states worse for 2 days. Pt has had some shob, no fever. Pt also co right elbow pain, states "I think I have bursitis". Pt has had 15 pk of beer tonight.

## 2019-09-05 NOTE — ED Notes (Signed)
Report from Chris, RN

## 2019-09-05 NOTE — Discharge Instructions (Signed)
Try to rest your elbow and take the ibuprofen to help with the inflammation.  If this is not resolving you to follow-up with the orthopedic surgery team.  Return to the ER for any other concerns.  Make sure you eat something while using the ibuprofen and stop taking if you develop black stools or blood in your stool or any other concerns.  Your cardiac work-up was reassuring.  For your shortness of breath you should cut down your smoking

## 2019-09-05 NOTE — ED Notes (Signed)
Patient requests to have BP cuff moved from right to left arm due to his "water" in his right elbow

## 2019-09-05 NOTE — ED Provider Notes (Signed)
[facility] Emergency Department Provider Note  ____________________________________________   First MD Initiated Contact with Patient 09/05/19 0701     (approximate)  I have reviewed the triage vital signs and the nursing notes.   HISTORY  Chief Complaint Chest Pain    HPI Jesse Obrien is a 59 y.o. male with history of alcohol abuse, hypertension who comes in with chest pain.  Patient reported that he had some chest discomfort that has been going on for months.  He states he is not sure exactly what brings it on.  He states that that pain has gone away completely and he is no longer worried about that.  The pain is mild in nature, intermittent.  He also reports of shortness of breath to the triage note but again states that he does smoke cigarettes and thinks he may be starting to develop COPD.  Denies any risk factors for PE.  Currently denies feeling short of breath at this time.  No known Covid contacts but he is open to getting tested today.  Finally patient reports that he has had swelling behind his right elbow that started yesterday.  Denies any trauma or pain to the elbow.  No fevers, redness, warmth.  He is able to range it well.  Denies this ever happening before.          Past Medical History:  Diagnosis Date  . Alcohol abuse   . DJD (degenerative joint disease)   . Hypertension 01/02/2015    Patient Active Problem List   Diagnosis Date Noted  . Suicidal behavior 10/28/2015  . Noncompliance 10/28/2015  . Tobacco use disorder 10/28/2015  . Substance induced mood disorder ([place]) 10/28/2015  . GERD (gastroesophageal reflux disease) 10/28/2015  . Alcohol dependence with uncomplicated withdrawal ([place]) 03/20/2015  . DDD (degenerative disc disease), lumbar 03/20/2015  . Osteoarthrosis, unspecified whether generalized or localized, involving lower leg 03/20/2015  . Spinal stenosis of lumbar region 03/20/2015  . Major depressive disorder,  recurrent, severe with psychotic features ([place]) 03/19/2015  . Self inflicted neck laceration 01/02/2015  . Hypertension 01/02/2015    No past surgical history on file.  Prior to Admission medications   Medication Sig Start Date End Date Taking? Authorizing Provider  chlordiazePOXIDE (LIBRIUM) 5 MG capsule Take 6 caps (30 mg) 4x daily on day 1. Take 5 caps (25 mg) 4x daily on day 2. Take 4 caps (20 mg) 4x daily on day 3. Take 3 caps (15 mg) 4x daily on day 4. Take 2 caps (10 mg) 4x daily on day 5. Take 2 caps (10 mg) 3x daily on day 6. Take 1 cap (5 mg) 3x daily on day 7. Take 1 cap (5 mg) twice daily on day 8. Take 1 cap (5 mg) in the evening on day 9. 06/24/19   Hinda Kehr, MD  citalopram (CELEXA) 20 MG tablet Take 1 tablet (20 mg total) by mouth daily. 06/24/19 06/23/20  Hinda Kehr, MD  meloxicam (MOBIC) 15 MG tablet Take 1 tablet (15 mg total) by mouth daily. 06/24/19 06/23/20  Hinda Kehr, MD  pantoprazole (PROTONIX) 40 MG tablet Take 1 tablet (40 mg total) by mouth daily. 06/24/19   Hinda Kehr, MD  traZODone (DESYREL) 150 MG tablet Take 1 tablet (150 mg total) by mouth at bedtime. 06/24/19   Hinda Kehr, MD    Allergies Patient has no known allergies.  No family history on file.  Social History Social History   Tobacco Use  .  Smoking status: Current Every Day Smoker    Years: 15.00  . Smokeless tobacco: Never Used  Substance Use Topics  . Alcohol use: Yes  . Drug use: No      Review of Systems Constitutional: No fever/chills Eyes: No visual changes. ENT: No sore throat. Cardiovascular: Positive chest pain Respiratory: Positive shortness of breath Gastrointestinal: No abdominal pain.  No nausea, no vomiting.  No diarrhea.  No constipation. Genitourinary: Negative for dysuria. Musculoskeletal: Negative for back pain.  Right elbow swelling Skin: Negative for rash. Neurological: Negative for headaches, focal weakness or numbness. All other ROS  negative ____________________________________________   PHYSICAL EXAM:  VITAL SIGNS: ED Triage Vitals  Enc Vitals Group     BP 09/05/19 0445 140/80     Pulse Rate 09/05/19 0443 86     Resp 09/05/19 0443 20     Temp 09/05/19 0443 98.6 F (37 C)     Temp Source 09/05/19 0443 Oral     SpO2 09/05/19 0443 97 %     Weight 09/05/19 0444 205 lb (93 kg)     Height 09/05/19 0444 5\' 10"  (1.778 m)     Head Circumference --      Peak Flow --      Pain Score 09/05/19 0444 5     Pain Loc --      Pain Edu? --      Excl. in [place]? --     Constitutional: Alert and oriented. Well appearing and in no acute distress. Eyes: Conjunctivae are normal. EOMI. Head: Atraumatic. Nose: No congestion/rhinnorhea. Mouth/Throat: Mucous membranes are moist.   Neck: No stridor. Trachea Midline. FROM Cardiovascular: Normal rate, regular rhythm. Grossly normal heart sounds.  Good peripheral circulation. Respiratory: Normal respiratory effort.  No retractions.  Clear lungs for the most part.  I heard one expiratory wheeze but it resolved with coughing Gastrointestinal: Soft and nontender. No distention. No abdominal bruits.  Musculoskeletal: No lower extremity tenderness nor edema.  Olecranon bursitis on the right.  No warmth or erythema.  Able to range the joint fully.  Good distal pulse Neurologic:  Normal speech and language. No gross focal neurologic deficits are appreciated.  Skin:  Skin is warm, dry and intact. No rash noted. Psychiatric: Mood and affect are normal. Speech and behavior are normal. GU: Deferred   ____________________________________________   LABS (all labs ordered are listed, but only abnormal results are displayed)  Labs Reviewed  ETHANOL - Abnormal; Notable for the following components:      Result Value   Alcohol, Ethyl (B) 189 (*)    All other components within normal limits  COMPREHENSIVE METABOLIC PANEL - Abnormal; Notable for the following components:   Glucose, Bld 104 (*)     Calcium 8.8 (*)    All other components within normal limits  SARS CORONAVIRUS 2 (TAT 6-24 HRS)  CBC  TROPONIN I (HIGH SENSITIVITY)  TROPONIN I (HIGH SENSITIVITY)   ____________________________________________   ED ECG REPORT I, Vanessa [place], the attending physician, personally viewed and interpreted this ECG.  EKG is normal sinus rate of 84, no ST elevation, no T wave inversions, normal intervals ____________________________________________  RADIOLOGY Robert Bellow, personally viewed and evaluated these images (plain radiographs) as part of my medical decision making, as well as reviewing the written report by the radiologist.  ED MD interpretation: No pneumonia noted  Official radiology report(s): DG Chest 2 View  Result Date: 09/05/2019 CLINICAL DATA:  Shortness of breath EXAM: CHEST - 2  VIEW COMPARISON:  December 25, 2013 FINDINGS: The heart size and mediastinal contours are within normal limits. Both lungs are clear. The visualized skeletal structures are unremarkable. IMPRESSION: No active cardiopulmonary disease. Electronically Signed   By: Prudencio Pair M.D.   On: 09/05/2019 05:33    ____________________________________________   PROCEDURES  Procedure(s) performed (including Critical Care):  Procedures   ____________________________________________   INITIAL IMPRESSION / ASSESSMENT AND PLAN / ED COURSE   Jesse Obrien was evaluated in Emergency Department on 09/05/2019 for the symptoms described in the history of present illness. He was evaluated in the context of the global COVID-19 pandemic, which necessitated consideration that the patient might be at risk for infection with the SARS-CoV-2 virus that causes COVID-19. Institutional protocols and algorithms that pertain to the evaluation of patients at risk for COVID-19 are in a state of rapid change based on information released by regulatory bodies including the CDC and federal and state organizations. These  policies and algorithms were followed during the patient's care in the ED.    Most Likely DDx:  -MSK (atypical chest pain) but will get cardiac markers to evaluate for ACS given risk factors/age  DDx that was also considered d/t potential to cause harm, but was found less likely based on history and physical (as detailed above): -PNA (no fevers, cough but CXR to evaluate) -PNX (reassured with equal b/l breath sounds, CXR to evaluate) -Symptomatic anemia (will get H&H) -Pulmonary embolism as no sob at rest, not pleuritic in nature, no hypoxia -Aortic Dissection as no tearing pain and no radiation to the mid back, pulses equal -Pericarditis no rub on exam, EKG changes or hx to suggest dx -Tamponade (no notable SOB, tachycardic, hypotensive) -Esophageal rupture (no h/o diffuse vomitting/no crepitus).  For patient's shortness of breath I suspect is most likely secondary to his smoking history.  Patient has no risk factors for PE.  He is currently no longer short of breath and was just happy that the chest x-ray was negative.  We discussed cutting back on his smoking.  For his olecranon bursitis there is no evidence of this being septic in nature.  Very mild amount of swelling.  Patient denies having this previously.  We discussed treatment with anti-inflammatories.  Discussed that he should cut down his alcohol use while using as needed with food to prevent GI bleeding.  We also discussed rest ice compression and elevation  No evidence of anemia.  Cardiac markers are negative.  Chest x-ray negative  Tropes negative x2   Alcohol is elevated at 189.  Patient has been here for 3 hours and is clinically acting sober.  Patient is going to get a ride home.     ____________________________________________   FINAL CLINICAL IMPRESSION(S) / ED DIAGNOSES   Final diagnoses:  Olecranon bursitis of right elbow  Chest pain, unspecified type     MEDICATIONS GIVEN DURING THIS VISIT:  Medications  - No data to display   ED Discharge Orders         Ordered    ibuprofen (ADVIL) 400 MG tablet  Every 8 hours PRN     09/05/19 0740           Note:  This document was prepared using Dragon voice recognition software and may include unintentional dictation errors.   Vanessa [place], MD 09/05/19 [phone]

## 2019-09-05 NOTE — ED Notes (Signed)
Patient states he has experienced chest pain for the last "several months" that is intermittent. Patient states it hurts on the left side when pain comes, unsure of things that improve or worsen. Patient states he is concerned with his left lung due to hx of smoking. Patient states while he is here he wants to be seen, get his results, and go home. Patient also states that he wants his right elbow looked at due to "water" collecting around it. Right elbow is swollen. Patient states he has drank alcohol tonight/this AM

## 2019-09-05 NOTE — ED Notes (Signed)
Pt refusing to wear monitoring. States he has worn it for long enough. awaiting EDP update. Denies chest pain. Pt alert and oriented X4, cooperative, RR even and unlabored, color WNL. Pt in NAD.

## 2019-09-05 NOTE — ED Notes (Signed)
Patient hit call light and requested restroom assistance. When this nurse entered room patient was fully dressed and standing at door. Patient educated on toilet placement in room. Patient states he does not like the wires he had on him and he wanted to wear his own clothes.

## 2019-09-05 NOTE — ED Notes (Signed)
Placed ace wrap to right elbow as requested by EDP provider. Pt alert and oriented X 4, stable for discharge. RR even and unlabored, color WNL. Discussed discharge instructions and follow up when appropriate. Instructed to follow up with ER for any life threatening symptoms or concerns that patient or family of patient may have

## 2019-09-05 NOTE — ED Notes (Signed)
Registration at bedside.

## 2020-10-28 ENCOUNTER — Encounter: Payer: Self-pay | Admitting: Emergency Medicine

## 2020-10-28 ENCOUNTER — Other Ambulatory Visit: Payer: Self-pay

## 2020-10-28 ENCOUNTER — Emergency Department
Admission: EM | Admit: 2020-10-28 | Discharge: 2020-10-28 | Disposition: A | Discharge status: Home / Self Care | Attending: Emergency Medicine | Admitting: Emergency Medicine

## 2020-10-28 DIAGNOSIS — I10 Essential (primary) hypertension: Secondary | ICD-10-CM | POA: Insufficient documentation

## 2020-10-28 DIAGNOSIS — F17210 Nicotine dependence, cigarettes, uncomplicated: Secondary | ICD-10-CM | POA: Insufficient documentation

## 2020-10-28 DIAGNOSIS — F10129 Alcohol abuse with intoxication, unspecified: Secondary | ICD-10-CM | POA: Diagnosis not present

## 2020-10-28 DIAGNOSIS — Y908 Blood alcohol level of 240 mg/100 ml or more: Secondary | ICD-10-CM | POA: Diagnosis not present

## 2020-10-28 DIAGNOSIS — F1010 Alcohol abuse, uncomplicated: Secondary | ICD-10-CM

## 2020-10-28 LAB — COMPREHENSIVE METABOLIC PANEL
ALT: 38 U/L (ref 0–44)
AST: 38 U/L (ref 15–41)
Albumin: 4.9 g/dL (ref 3.5–5.0)
Alkaline Phosphatase: 85 U/L (ref 38–126)
Anion gap: 10 (ref 5–15)
BUN: 7 mg/dL (ref 6–20)
CO2: 24 mmol/L (ref 22–32)
Calcium: 9.2 mg/dL (ref 8.9–10.3)
Chloride: 106 mmol/L (ref 98–111)
Creatinine, Ser: 0.84 mg/dL (ref 0.61–1.24)
GFR, Estimated: >60 mL/min (ref >60)
Glucose, Bld: 95 mg/dL (ref 70–99)
Potassium: 3.9 mmol/L (ref 3.5–5.1)
Sodium: 140 mmol/L (ref 135–145)
Total Bilirubin: 0.8 mg/dL (ref 0.3–1.2)
Total Protein: 8.7 g/dL — ABNORMAL HIGH (ref 6.5–8.1)

## 2020-10-28 LAB — CBC
HCT: 50.4 % (ref 39.0–52.0)
Hemoglobin: 17.5 g/dL — ABNORMAL HIGH (ref 13.0–17.0)
MCH: 33.8 pg (ref 26.0–34.0)
MCHC: 34.7 g/dL (ref 30.0–36.0)
MCV: 97.5 fL (ref 80.0–100.0)
Platelets: 223 10*3/uL (ref 150–400)
RBC: 5.17 MIL/uL (ref 4.22–5.81)
RDW: 12.6 % (ref 11.5–15.5)
WBC: 9.5 10*3/uL (ref 4.0–10.5)
nRBC: 0 % (ref 0.0–0.2)

## 2020-10-28 LAB — ETHANOL: Alcohol, Ethyl (B): 314 mg/dL (ref <10)

## 2020-10-28 LAB — MAGNESIUM: Magnesium: 2.6 mg/dL — ABNORMAL HIGH (ref 1.7–2.4)

## 2020-10-28 MED ORDER — THIAMINE HCL 100 MG/ML IJ SOLN: 100.0000 mg | Freq: Every day | INTRAMUSCULAR | Status: DC

## 2020-10-28 MED ORDER — LORAZEPAM 2 MG/ML IJ SOLN: 0.0000 mg | Freq: Two times a day (BID) | INTRAMUSCULAR | Status: DC

## 2020-10-28 MED ORDER — LORAZEPAM 2 MG PO TABS: 0.0000 mg | ORAL_TABLET | Freq: Two times a day (BID) | ORAL | Status: DC

## 2020-10-28 MED ORDER — NICOTINE 21 MG/24HR TD PT24: 21.0000 mg | MEDICATED_PATCH | Freq: Every day | TRANSDERMAL | Status: DC

## 2020-10-28 MED ORDER — LORAZEPAM 2 MG/ML IJ SOLN: 0.0000 mg | Freq: Four times a day (QID) | INTRAMUSCULAR | Status: DC

## 2020-10-28 MED ORDER — THIAMINE HCL 100 MG PO TABS: 100.0000 mg | ORAL_TABLET | Freq: Every day | ORAL | Status: DC

## 2020-10-28 MED ORDER — LORAZEPAM 2 MG PO TABS: 0.0000 mg | ORAL_TABLET | Freq: Four times a day (QID) | ORAL | Status: DC

## 2020-10-28 NOTE — ED Triage Notes (Signed)
Pt to ED via [facility] with c/o ETOH intoxication. Pt states binge drinker, drinks a case every 3-4 days. [facility] reports has received calls to residences for the last few days. Pt to ED voluntarily for ETOh intoxication. Pt denies SI/HI at this time.  Pt states has drank a case of beer today.

## 2020-10-28 NOTE — ED Notes (Signed)
Pt stating he does not want treatment. Pt given referral information with discharge papers. Pt wife at ed to pick patient up due to alcohol level. Pt making inappropriate remarks to this RN and staff. Pt able to ambulate with steady gait to lobby at this time. E-signature not working at this time. Pt verbalized understanding of D/C instructions, prescriptions and follow up care with no further questions at this time. Pt in NAD and ambulatory at time of D/C.

## 2020-10-28 NOTE — BH Assessment (Signed)
Comprehensive Clinical Assessment (CCA) Note  10/28/2020 Jesse Obrien [id number]   Jesse Obrien, 60 year old male who presents to [facility] ED voluntarily for treatment. Per triage note, Pt to ED via [facility] with c/o ETOH intoxication. Pt states binge drinker, drinks a case every 3-4 days. [facility] reports has received calls to residences for the last few days. Pt to ED voluntarily for ETOh intoxication. Pt denies SI/HI at this time. Pt states has drank a case of beer today.   During TTS assessment pt presents alert and oriented x 4, restless but cooperative, and mood-congruent with affect. The pt does not appear to be responding to internal or external stimuli. Neither is the pt presenting with any delusional thinking. Pt verified the information provided to triage RN.   Pt identifies his main complaint to be that he drinks too much. Patient states he drinks because he is in pain and drinking helps ease the pain. Patient reports he has been consistent with this routine for the past 10 years. Patient reports he was in the [employer] and suffers from chronic pain in his back, knee, and shoulders. Patient states he does not want inpatient treatment at this time and will consider going for outpatient later. Patient denied drug use. Pt reports no current INPT hx or OPT hx. Pt reports a medical hx of chronic pain. Pt denies SI/HI/AH/VH. Writer provided patient with local resources for substance abuse treatment.     Chief Complaint:  Chief Complaint  Patient presents with  . Alcohol Problem   Visit Diagnosis: Alcohol Use Disorder    CCA Screening, Triage and Referral (STR)  Patient Reported Information How did you hear about [place]? No data recorded Referral name: EMS  Referral phone number: No data recorded  Whom do you see for routine medical problems? No data recorded Practice/Facility Name: No data recorded Practice/Facility Phone Number: No data recorded Name of Contact: No data recorded Contact Number:  No data recorded Contact Fax Number: No data recorded Prescriber Name: No data recorded Prescriber Address (if known): No data recorded  What Is the Reason for Your Visit/Call Today? No data recorded How Long Has This Been Causing You Problems? > than 6 months  What Do You Feel Would Help You the Most Today? Alcohol or Drug Use Treatment; Medication(s)   Have You Recently Been in Any Inpatient Treatment (Hospital/Detox/Crisis Center/28-Day Program)? No  Name/Location of Program/Hospital:No data recorded How Long Were You There? No data recorded When Were You Discharged? No data recorded  Have You Ever Received Services From [facility] Before? No data recorded Who Do You See at [facility]? No data recorded  Have You Recently Had Any Thoughts About Hurting Yourself? No  Are You Planning to Commit Suicide/Harm Yourself At This time? No   Have you Recently Had Thoughts About [place]? No  Explanation: No data recorded  Have You Used Any Alcohol or Drugs in the Past 24 Hours? Yes  How Long Ago Did You Use Drugs or Alcohol? No data recorded What Did You Use and How Much? Alcohol..Patient reports he drinks 12 pack or case of beer every 3-4 days.   Do You Currently Have a Therapist/Psychiatrist? No  Name of Therapist/Psychiatrist: No data recorded  Have You Been Recently Discharged From Any Office Practice or Programs? No  Explanation of Discharge From Practice/Program: No data recorded    CCA Screening Triage Referral Assessment Type of Contact: Face-to-Face  Is this Initial or Reassessment? No data recorded Date  Telepsych consult ordered in CHL:  No data recorded Time Telepsych consult ordered in CHL:  No data recorded  Patient Reported Information Reviewed? Yes  Patient Left Without Being Seen? No data recorded Reason for Not Completing Assessment: No data recorded  Collateral Involvement: None provided   Does Patient Have a [place]? No data recorded Name and Contact of Legal Guardian: No data recorded If Minor and Not Living with Parent(s), Who has Custody? n/a  Is CPS involved or ever been involved? Never  Is APS involved or ever been involved? Never   Patient Determined To Be At Risk for Harm To Self or Others Based on Review of Patient Reported Information or Presenting Complaint? No  Method: No data recorded Availability of Means: No data recorded Intent: No data recorded Notification Required: No data recorded Additional Information for Danger to Others Potential: No data recorded Additional Comments for Danger to Others Potential: No data recorded Are There Guns or Other Weapons in Your Home? No data recorded Types of Guns/Weapons: No data recorded Are These Weapons Safely Secured?                            No data recorded Who Could Verify You Are Able To Have These Secured: No data recorded Do You Have any Outstanding Charges, Pending Court Dates, Parole/Probation? No data recorded Contacted To Inform of Risk of Harm To Self or Others: No data recorded  Location of Assessment: [facility] ED   Does Patient Present under Involuntary Commitment? No  IVC Papers Initial File Date: No data recorded  [place] of Residence: [place]   Patient Currently Receiving the Following Services: SAIOP (Substance Abuse Intensive Outpatient Program   Determination of Need: Emergent (2 hours)   Options For Referral: Medication Management; ED Visit     CCA Biopsychosocial Intake/Chief Complaint:  No data recorded Current Symptoms/Problems: No data recorded  Patient Reported Schizophrenia/Schizoaffective Diagnosis in Past: No data recorded  Strengths: No data recorded Preferences: No data recorded Abilities: No data recorded  Type of Services Patient Feels are Needed: No data recorded  Initial Clinical Notes/Concerns: No data recorded  Mental Health Symptoms Depression:  No data recorded   Duration of Depressive symptoms: No data recorded  Mania:  No data recorded  Anxiety:   No data recorded  Psychosis:  No data recorded  Duration of Psychotic symptoms: No data recorded  Trauma:  No data recorded  Obsessions:  No data recorded  Compulsions:  No data recorded  Inattention:  No data recorded  Hyperactivity/Impulsivity:  No data recorded  Oppositional/Defiant Behaviors:  No data recorded  Emotional Irregularity:  No data recorded  Other Mood/Personality Symptoms:  No data recorded   Mental Status Exam Appearance and self-care  Stature:  No data recorded  Weight:  No data recorded  Clothing:  No data recorded  Grooming:  No data recorded  Cosmetic use:  No data recorded  Posture/gait:  No data recorded  Motor activity:  No data recorded  Sensorium  Attention:  No data recorded  Concentration:  No data recorded  Orientation:  No data recorded  Recall/memory:  No data recorded  Affect and Mood  Affect:  No data recorded  Mood:  No data recorded  Relating  Eye contact:  No data recorded  Facial expression:  No data recorded  Attitude toward examiner:  No data recorded  Thought and Language  Speech flow: No data recorded  Thought content:  No data recorded  Preoccupation:  No data recorded  Hallucinations:  No data recorded  Organization:  No data recorded  [employer] of Knowledge:  No data recorded  Intelligence:  No data recorded  Abstraction:  No data recorded  Judgement:  No data recorded  Reality Testing:  No data recorded  Insight:  No data recorded  Decision Making:  No data recorded  Social Functioning  Social Maturity:  No data recorded  Social Judgement:  No data recorded  Stress  Stressors:  No data recorded  Coping Ability:  No data recorded  Skill Deficits:  No data recorded  Supports:  No data recorded    Religion:    Leisure/Recreation:    Exercise/Diet:     CCA Employment/Education Employment/Work  Situation:    Education:     CCA Family/Childhood History Family and Relationship History:    Childhood History:     Child/Adolescent Assessment:     CCA Substance Use Alcohol/Drug Use:                           ASAM's:  Six Dimensions of Multidimensional Assessment  Dimension 1:  Acute Intoxication and/or Withdrawal Potential:      Dimension 2:  Biomedical Conditions and Complications:      Dimension 3:  Emotional, Behavioral, or Cognitive Conditions and Complications:     Dimension 4:  Readiness to Change:     Dimension 5:  Relapse, Continued use, or Continued Problem Potential:     Dimension 6:  Recovery/Living Environment:     ASAM Severity Score:    ASAM Recommended Level of Treatment:     Substance use Disorder (SUD)    Recommendations for Services/Supports/Treatments:    DSM5 Diagnoses: Patient Active Problem List   Diagnosis Date Noted  . Suicidal behavior 10/28/2015  . Noncompliance 10/28/2015  . Tobacco use disorder 10/28/2015  . Substance induced mood disorder ([place]) 10/28/2015  . GERD (gastroesophageal reflux disease) 10/28/2015  . Alcohol dependence with uncomplicated withdrawal ([place]) 03/20/2015  . DDD (degenerative disc disease), lumbar 03/20/2015  . Osteoarthrosis, unspecified whether generalized or localized, involving lower leg 03/20/2015  . Spinal stenosis of lumbar region 03/20/2015  . Major depressive disorder, recurrent, severe with psychotic features ([place]) 03/19/2015  . Self inflicted neck laceration 01/02/2015  . Hypertension 01/02/2015    Patient Centered Plan: Patient is on the following Treatment Plan(s):  Substance Abuse   Referrals to Alternative Service(s): Referred to Alternative Service(s):   Place:   Date:   Time:    Referred to Alternative Service(s):   Place:   Date:   Time:    Referred to Alternative Service(s):   Place:   Date:   Time:    Referred to Alternative Service(s):   Place:   Date:   Time:      Kaleth Koy Glennon Mac, Counselor, LCAS-A

## 2020-10-28 NOTE — ED Provider Notes (Addendum)
[facility] Emergency Department Provider Note  ____________________________________________   Event Date/Time   First MD Initiated Contact with Patient 10/28/20 1652     (approximate)  I have reviewed the triage vital signs and the nursing notes.   HISTORY  Chief Complaint Alcohol Problem   HPI Jesse Obrien is a 60 y.o. male with a past medical history of DJD, HTN and alcohol abuse who presents voluntarily accompanied by [name] for assessment of persistent alcohol abuse.  Patient was brought in by [name] for assistance with his alcohol use.  Patient is clinically intoxicated initial interview but states he is coming in requesting assistance with alcohol use.  He states he drinks approximately 1 case every 3 to 4 days but is not a daily drinker.  States he had a case earlier today before coming in.  States he typically drinks to help with his chronic back and knee and hip pain.  He has not had any falls last couple days, chest pain, change in his chronic cough, abdominal pain, back pain, headache, earache, sore throat, rash or any other acute sick symptoms.  Denies taking any other medications or any illicit drug use.  Endorses tobacco abuse.  Denies any SI HI or hallucinations.  States he is never gone to alcohol withdrawal before.         Past Medical History:  Diagnosis Date  . Alcohol abuse   . DJD (degenerative joint disease)   . Hypertension 01/02/2015    Patient Active Problem List   Diagnosis Date Noted  . Suicidal behavior 10/28/2015  . Noncompliance 10/28/2015  . Tobacco use disorder 10/28/2015  . Substance induced mood disorder ([place]) 10/28/2015  . GERD (gastroesophageal reflux disease) 10/28/2015  . Alcohol dependence with uncomplicated withdrawal ([place]) 03/20/2015  . DDD (degenerative disc disease), lumbar 03/20/2015  . Osteoarthrosis, unspecified whether generalized or localized, involving lower leg 03/20/2015  . Spinal  stenosis of lumbar region 03/20/2015  . Major depressive disorder, recurrent, severe with psychotic features ([place]) 03/19/2015  . Self inflicted neck laceration 01/02/2015  . Hypertension 01/02/2015    History reviewed. No pertinent surgical history.  Prior to Admission medications   Medication Sig Start Date End Date Taking? Authorizing Provider  chlordiazePOXIDE (LIBRIUM) 5 MG capsule Take 6 caps (30 mg) 4x daily on day 1. Take 5 caps (25 mg) 4x daily on day 2. Take 4 caps (20 mg) 4x daily on day 3. Take 3 caps (15 mg) 4x daily on day 4. Take 2 caps (10 mg) 4x daily on day 5. Take 2 caps (10 mg) 3x daily on day 6. Take 1 cap (5 mg) 3x daily on day 7. Take 1 cap (5 mg) twice daily on day 8. Take 1 cap (5 mg) in the evening on day 9. 06/24/19   Hinda Kehr, MD  citalopram (CELEXA) 20 MG tablet Take 1 tablet (20 mg total) by mouth daily. 06/24/19 06/23/20  Hinda Kehr, MD  pantoprazole (PROTONIX) 40 MG tablet Take 1 tablet (40 mg total) by mouth daily. 06/24/19   Hinda Kehr, MD  traZODone (DESYREL) 150 MG tablet Take 1 tablet (150 mg total) by mouth at bedtime. 06/24/19   Hinda Kehr, MD    Allergies Patient has no known allergies.  History reviewed. No pertinent family history.  Social History Social History   Tobacco Use  . Smoking status: Current Every Day Smoker    Years: 15.00  . Smokeless tobacco: Never Used  Substance Use Topics  .  Alcohol use: Yes  . Drug use: No    Review of Systems  Review of Systems  Constitutional: Negative for chills and fever.  HENT: Negative for sore throat.   Eyes: Negative for pain.  Respiratory: Negative for cough and stridor.   Cardiovascular: Negative for chest pain.  Gastrointestinal: Negative for vomiting.  Musculoskeletal: Positive for back pain ( chronic) and joint pain ( chronic b/l knee and hip pain ).  Skin: Negative for rash.  Neurological: Negative for seizures, loss of consciousness and headaches.   Psychiatric/Behavioral: Negative for suicidal ideas.  All other systems reviewed and are negative.     ____________________________________________   PHYSICAL EXAM:  VITAL SIGNS: ED Triage Vitals  Enc Vitals Group     BP 10/28/20 1644 (!) 165/117     Pulse Rate 10/28/20 1644 100     Resp 10/28/20 1644 18     Temp 10/28/20 1643 (P) 98.8 F (37.1 C)     Temp Source 10/28/20 1643 (P) Oral     SpO2 10/28/20 1644 100 %     Weight 10/28/20 1642 205 lb (93 kg)     Height 10/28/20 1642 5\' 10"  (1.778 m)     Head Circumference --      Peak Flow --      Pain Score 10/28/20 1649 0     Pain Loc --      Pain Edu? --      Excl. in [place]? --    Vitals:   10/28/20 1643 10/28/20 1644  BP:  (!) 165/117  Pulse:  100  Resp:  18  Temp: (P) 98.8 F (37.1 C) 98.8 F (37.1 C)  SpO2:  100%   Physical Exam Vitals and nursing note reviewed.  Constitutional:      Appearance: He is well-developed.  HENT:     Head: Normocephalic and atraumatic.     Right Ear: External ear normal.     Left Ear: External ear normal.     Nose: Nose normal.     Mouth/Throat:     Mouth: Mucous membranes are moist.  Eyes:     Conjunctiva/sclera: Conjunctivae normal.  Cardiovascular:     Rate and Rhythm: Normal rate and regular rhythm.     Heart sounds: No murmur heard.   Pulmonary:     Effort: Pulmonary effort is normal. No respiratory distress.     Breath sounds: Normal breath sounds.  Abdominal:     Palpations: Abdomen is soft.     Tenderness: There is no abdominal tenderness.  Musculoskeletal:     Cervical back: Neck supple.  Skin:    General: Skin is warm and dry.     Capillary Refill: Capillary refill takes less than 2 seconds.  Neurological:     Mental Status: He is alert and oriented to person, place, and time.  Psychiatric:        Mood and Affect: Mood normal.        Speech: Speech is slurred.        Thought Content: Thought content does not include homicidal or suicidal ideation.       ____________________________________________   LABS (all labs ordered are listed, but only abnormal results are displayed)  Labs Reviewed  COMPREHENSIVE METABOLIC PANEL - Abnormal; Notable for the following components:      Result Value   Total Protein 8.7 (*)    All other components within normal limits  CBC - Abnormal; Notable for the following components:   Hemoglobin 17.5 (*)  All other components within normal limits  SARS CORONAVIRUS 2 (TAT 6-24 HRS)  ETHANOL  URINE DRUG SCREEN, QUALITATIVE ([facility] ONLY)  MAGNESIUM   ____________________________________________  EKG  ____________________________________________  RADIOLOGY  ED MD interpretation:    Official radiology report(s): No results found.  ____________________________________________   PROCEDURES  Procedure(s) performed (including Critical Care):  Procedures   ____________________________________________   INITIAL IMPRESSION / ASSESSMENT AND PLAN / ED COURSE      Patient presents with above-stated history exam for assessment requesting assistance with his alcohol use disorder.  On arrival he is hypertensive with a BP of 155/117 with otherwise stable vital signs on room air.  He does appear intoxicated but does not appear acutely psychotic and is not suicidal or homicidal.  He denies any other associated physical complaints.  Low suspicion for recent acute trauma, acute infectious process or significant metabolic derangements.  Basic screening psych labs sent in triage.  TTS consulted for assistance in identifying appropriate resources suspect patient likely benefit from inpatient treatment.  The patient has been placed in psychiatric observation due to the need to provide a safe environment for the patient while obtaining psychiatric consultation and evaluation, as well as ongoing medical and medication management to treat the patient's condition.  The patient has not been placed under full IVC at  this time.  6:10 PM  Approximately 610 patient stated he wished to leave and only wish to stay for help.  I reassessed him and he was able to ambulate with steady gait unassisted and his fluids.  Had significant improved.  I think he is not clinically sober and likely was a slightly elevated alcohol levels at baseline.  His wife will come to the emergency room to pick him up.  He was given resources for alcohol abuse and advised to follow-up with cranial clinic to have his blood pressure rechecked and possibly start on blood pressure medicines.  Discharged stable condition.       ____________________________________________   FINAL CLINICAL IMPRESSION(S) / ED DIAGNOSES  Final diagnoses:  Alcohol abuse  Hypertension, unspecified type    Medications  LORazepam (ATIVAN) injection 0-4 mg (has no administration in time range)    Or  LORazepam (ATIVAN) tablet 0-4 mg (has no administration in time range)  LORazepam (ATIVAN) injection 0-4 mg (has no administration in time range)    Or  LORazepam (ATIVAN) tablet 0-4 mg (has no administration in time range)  thiamine tablet 100 mg (has no administration in time range)    Or  thiamine (B-1) injection 100 mg (has no administration in time range)  nicotine (NICODERM CQ - dosed in mg/24 hours) patch 21 mg (has no administration in time range)     ED Discharge Orders    None       Note:  This document was prepared using Dragon voice recognition software and may include unintentional dictation errors.   Lucrezia Starch, MD 10/28/20 1725    Lucrezia Starch, MD 10/28/20 [phone]

## 2020-11-22 ENCOUNTER — Emergency Department
Admission: EM | Admit: 2020-11-22 | Discharge: 2020-11-22 | Disposition: A | Discharge status: Home / Self Care | Attending: Emergency Medicine | Admitting: Emergency Medicine

## 2020-11-22 ENCOUNTER — Other Ambulatory Visit: Payer: Self-pay

## 2020-11-22 DIAGNOSIS — F1010 Alcohol abuse, uncomplicated: Secondary | ICD-10-CM | POA: Diagnosis not present

## 2020-11-22 DIAGNOSIS — Y907 Blood alcohol level of 200-239 mg/100 ml: Secondary | ICD-10-CM | POA: Insufficient documentation

## 2020-11-22 DIAGNOSIS — F17200 Nicotine dependence, unspecified, uncomplicated: Secondary | ICD-10-CM | POA: Insufficient documentation

## 2020-11-22 DIAGNOSIS — K029 Dental caries, unspecified: Secondary | ICD-10-CM | POA: Insufficient documentation

## 2020-11-22 DIAGNOSIS — I10 Essential (primary) hypertension: Secondary | ICD-10-CM | POA: Diagnosis not present

## 2020-11-22 DIAGNOSIS — F10929 Alcohol use, unspecified with intoxication, unspecified: Secondary | ICD-10-CM | POA: Diagnosis present

## 2020-11-22 LAB — CBC
HCT: 42 % (ref 39.0–52.0)
Hemoglobin: 14.4 g/dL (ref 13.0–17.0)
MCH: 33.3 pg (ref 26.0–34.0)
MCHC: 34.3 g/dL (ref 30.0–36.0)
MCV: 97.2 fL (ref 80.0–100.0)
Platelets: 167 10*3/uL (ref 150–400)
RBC: 4.32 MIL/uL (ref 4.22–5.81)
RDW: 12.6 % (ref 11.5–15.5)
WBC: 9.6 10*3/uL (ref 4.0–10.5)
nRBC: 0 % (ref 0.0–0.2)

## 2020-11-22 LAB — URINE DRUG SCREEN, QUALITATIVE (ARMC ONLY)
Amphetamines, Ur Screen: NOT DETECTED
Barbiturates, Ur Screen: NOT DETECTED
Benzodiazepine, Ur Scrn: NOT DETECTED
Cannabinoid 50 Ng, Ur ~~LOC~~: NOT DETECTED
Cocaine Metabolite,Ur ~~LOC~~: NOT DETECTED
MDMA (Ecstasy)Ur Screen: NOT DETECTED
Methadone Scn, Ur: NOT DETECTED
Opiate, Ur Screen: NOT DETECTED
Phencyclidine (PCP) Ur S: NOT DETECTED
Tricyclic, Ur Screen: NOT DETECTED

## 2020-11-22 LAB — COMPREHENSIVE METABOLIC PANEL
ALT: 29 U/L (ref 0–44)
AST: 31 U/L (ref 15–41)
Albumin: 4.2 g/dL (ref 3.5–5.0)
Alkaline Phosphatase: 65 U/L (ref 38–126)
Anion gap: 11 (ref 5–15)
BUN: 8 mg/dL (ref 6–20)
CO2: 20 mmol/L — ABNORMAL LOW (ref 22–32)
Calcium: 8.4 mg/dL — ABNORMAL LOW (ref 8.9–10.3)
Chloride: 102 mmol/L (ref 98–111)
Creatinine, Ser: 0.83 mg/dL (ref 0.61–1.24)
GFR, Estimated: >60 mL/min (ref >60)
Glucose, Bld: 88 mg/dL (ref 70–99)
Potassium: 3.3 mmol/L — ABNORMAL LOW (ref 3.5–5.1)
Sodium: 133 mmol/L — ABNORMAL LOW (ref 135–145)
Total Bilirubin: 0.8 mg/dL (ref 0.3–1.2)
Total Protein: 6.9 g/dL (ref 6.5–8.1)

## 2020-11-22 LAB — ETHANOL: Alcohol, Ethyl (B): 232 mg/dL — ABNORMAL HIGH (ref <10)

## 2020-11-22 MED ORDER — ONE-A-DAY MENS PO TABS: 1.0000 | ORAL_TABLET | Freq: Every day | ORAL | 3 refills | Status: AC

## 2020-11-22 MED ORDER — PREDNISONE 20 MG PO TABS: 60.0000 mg | ORAL_TABLET | Freq: Every day | ORAL | 0 refills | Status: DC

## 2020-11-22 MED ORDER — THIAMINE HCL 100 MG PO TABS: 100.0000 mg | ORAL_TABLET | Freq: Every day | ORAL | 3 refills | Status: AC

## 2020-11-22 MED ORDER — FOLIC ACID 1 MG PO TABS: 1.0000 mg | ORAL_TABLET | Freq: Every day | ORAL | 3 refills | Status: AC

## 2020-11-22 MED ORDER — ALBUTEROL SULFATE HFA 108 (90 BASE) MCG/ACT IN AERS: 2.0000 | INHALATION_SPRAY | RESPIRATORY_TRACT | 1 refills | Status: DC | PRN

## 2020-11-22 NOTE — ED Notes (Addendum)
Ambulates steadily to and from bathroom. Urine sample collected and sent to lab.

## 2020-11-22 NOTE — ED Triage Notes (Signed)
Pt states is here for help with alcohol. Last drink pta. Pt states he is a binge drinker and "I need help". Pt denies SI or HI.

## 2020-11-22 NOTE — ED Notes (Addendum)
Pt stating that once he speaks with an EDP, that he would like to be discharged.

## 2020-11-22 NOTE — ED Provider Notes (Signed)
[facility] Emergency Department Provider Note  ____________________________________________   Event Date/Time   First MD Initiated Contact with Patient 11/22/20 2305     (approximate)  I have reviewed the triage vital signs and the nursing notes.   HISTORY  Chief Complaint Alcohol Intoxication    HPI Jesse Obrien is a 60 y.o. male with history of hypertension, alcohol abuse who presents to the emergency department with alcohol intoxication.  He states that he is a binge drinker and will drink between 12 and 24 beers a day but does not drink every day.  Denies any history of alcohol withdrawal seizures, DTs, hallucinations.  Denies SI, HI.  States he drinks because of chronic dental and hip pain that is unchanged.  He also tells me that he has had intermittent wheezing because he is a smoker.  No shortness of breath, chest pain.  When asked if he is here for help for alcohol abuse he states no and states that he would like to go home now.        Past Medical History:  Diagnosis Date  . Alcohol abuse   . DJD (degenerative joint disease)   . Hypertension 01/02/2015    Patient Active Problem List   Diagnosis Date Noted  . Suicidal behavior 10/28/2015  . Noncompliance 10/28/2015  . Tobacco use disorder 10/28/2015  . Substance induced mood disorder ([place]) 10/28/2015  . GERD (gastroesophageal reflux disease) 10/28/2015  . Alcohol dependence with uncomplicated withdrawal ([place]) 03/20/2015  . DDD (degenerative disc disease), lumbar 03/20/2015  . Osteoarthrosis, unspecified whether generalized or localized, involving lower leg 03/20/2015  . Spinal stenosis of lumbar region 03/20/2015  . Major depressive disorder, recurrent, severe with psychotic features ([place]) 03/19/2015  . Self inflicted neck laceration 01/02/2015  . Hypertension 01/02/2015    No past surgical history on file.  Prior to Admission medications   Medication Sig Start Date End Date  Taking? Authorizing Provider  albuterol (VENTOLIN HFA) 108 (90 Base) MCG/ACT inhaler Inhale 2-4 puffs into the lungs every 4 (four) hours as needed for wheezing or shortness of breath. 11/22/20  Yes Breiona Couvillon, Delice Bison, DO  folic acid (FOLVITE) 1 MG tablet Take 1 tablet (1 mg total) by mouth daily. 11/22/20 11/22/21 Yes Tycen Dockter, Delice Bison, DO  multivitamin (ONE-A-DAY MEN'S) TABS tablet Take 1 tablet by mouth daily. 11/22/20  Yes Madelina Sanda, Delice Bison, DO  predniSONE (DELTASONE) 20 MG tablet Take 3 tablets (60 mg total) by mouth daily. 11/22/20  Yes Kellar Westberg, Delice Bison, DO  thiamine 100 MG tablet Take 1 tablet (100 mg total) by mouth daily. 11/22/20  Yes Kanasia Gayman, Delice Bison, DO  chlordiazePOXIDE (LIBRIUM) 5 MG capsule Take 6 caps (30 mg) 4x daily on day 1. Take 5 caps (25 mg) 4x daily on day 2. Take 4 caps (20 mg) 4x daily on day 3. Take 3 caps (15 mg) 4x daily on day 4. Take 2 caps (10 mg) 4x daily on day 5. Take 2 caps (10 mg) 3x daily on day 6. Take 1 cap (5 mg) 3x daily on day 7. Take 1 cap (5 mg) twice daily on day 8. Take 1 cap (5 mg) in the evening on day 9. 06/24/19   Hinda Kehr, MD  citalopram (CELEXA) 20 MG tablet Take 1 tablet (20 mg total) by mouth daily. 06/24/19 06/23/20  Hinda Kehr, MD  pantoprazole (PROTONIX) 40 MG tablet Take 1 tablet (40 mg total) by mouth daily. 06/24/19   Hinda Kehr, MD  traZODone (DESYREL) 150 MG tablet Take 1 tablet (150 mg total) by mouth at bedtime. 06/24/19   Hinda Kehr, MD    Allergies Patient has no known allergies.  No family history on file.  Social History Social History   Tobacco Use  . Smoking status: Current Every Day Smoker    Years: 15.00  . Smokeless tobacco: Never Used  Substance Use Topics  . Alcohol use: Yes  . Drug use: No    Review of Systems Constitutional: No fever. Eyes: No visual changes. ENT: No sore throat. Cardiovascular: Denies chest pain. Respiratory: Denies shortness of breath. Gastrointestinal: No nausea, vomiting,  diarrhea. Genitourinary: Negative for dysuria. Musculoskeletal: Negative for back pain. Skin: Negative for rash. Neurological: Negative for focal weakness or numbness.  ____________________________________________   PHYSICAL EXAM:  VITAL SIGNS: ED Triage Vitals  Enc Vitals Group     BP 11/22/20 2213 138/86     Pulse Rate 11/22/20 2213 97     Resp 11/22/20 2213 16     Temp 11/22/20 2213 98.4 F (36.9 C)     Temp Source 11/22/20 2213 Oral     SpO2 11/22/20 2213 97 %     Weight 11/22/20 2214 205 lb (93 kg)     Height 11/22/20 2214 5\' 10"  (1.778 m)     Head Circumference --      Peak Flow --      Pain Score 11/22/20 2214 0     Pain Loc --      Pain Edu? --      Excl. in [place]? --    CONSTITUTIONAL: Alert and oriented and responds appropriately to questions.  Chronically ill-appearing HEAD: Normocephalic EYES: Conjunctivae clear, pupils appear equal, EOM appear intact ENT: normal nose; moist mucous membranes; No pharyngeal erythema or petechiae, no tonsillar hypertrophy or exudate, no uvular deviation, no unilateral swelling, no trismus or drooling, no muffled voice, normal phonation, no stridor, multiple dental caries present, no drainable dental abscess noted, no Ludwig's angina, tongue sits flat in the bottom of the mouth, no angioedema, no facial erythema or warmth, no facial swelling; no pain with movement of the neck, no cervical LAD. NECK: Supple, normal ROM CARD: RRR; S1 and S2 appreciated; no murmurs, no clicks, no rubs, no gallops RESP: Normal chest excursion without splinting or tachypnea; breath sounds clear and equal bilaterally; minimal scattered wheezes with forcible expiration, no rhonchi or rales, no hypoxia or respiratory distress, speaking full sentences ABD/GI: Normal bowel sounds; non-distended; soft, non-tender, no rebound, no guarding, no peritoneal signs, no hepatosplenomegaly BACK: The back appears normal EXT: Normal ROM in all joints; no deformity noted, no  edema; no cyanosis SKIN: Normal color for age and race; warm; no rash on exposed skin NEURO: Moves all extremities equally, normal speech, no facial asymmetry, normal gait, no tremors PSYCH: The patient's mood and manner are appropriate.  Denies SI, HI, hallucinations.  ____________________________________________   LABS (all labs ordered are listed, but only abnormal results are displayed)  Labs Reviewed  COMPREHENSIVE METABOLIC PANEL - Abnormal; Notable for the following components:      Result Value   Sodium 133 (*)    Potassium 3.3 (*)    CO2 20 (*)    Calcium 8.4 (*)    All other components within normal limits  ETHANOL - Abnormal; Notable for the following components:   Alcohol, Ethyl (B) 232 (*)    All other components within normal limits  CBC  URINE DRUG SCREEN, QUALITATIVE ([facility] ONLY)  ____________________________________________  EKG   ____________________________________________  [place], personally viewed and evaluated these images (plain radiographs) as part of my medical decision making, as well as reviewing the written report by the radiologist.  ED MD interpretation:    Official radiology report(s): No results found.  ____________________________________________   PROCEDURES  Procedure(s) performed (including Critical Care):  Procedures   ____________________________________________   INITIAL IMPRESSION / ASSESSMENT AND PLAN / ED COURSE  As part of my medical decision making, I reviewed the following data within the [place] notes reviewed and incorporated, Labs reviewed , Old chart reviewed and Notes from prior ED visits         Patient here with alcohol intoxication.  Per nursing notes he was initially asking for rehab but now declines this stating that he wants to go home.  He has no SI, HI or hallucinations.  No sign of withdrawal.  Does not appear significantly intoxicated at this time  although alcohol level is in the 200s.  He states his wife can come and pick him up.  Patient given alcohol detox/rehab follow-up information as an outpatient.  Also discharged with prescriptions for multivitamin, thiamine and folate.  He reports he has chronic hip pain and chronic dental pain.  He is able to ambulate here and has no tenderness over his hips bilaterally.  He has multiple dental caries without sign of dental abscess.  I do not feel he needs prophylactic antibiotics at this time given this is chronic and unchanged.  Have given him outpatient dental follow-up information.  He also complains of wheezing and states he is a smoker.  No chest pain or shortness of breath.  Does have some end expiratory wheezing with forceful expirations.  Will discharge with albuterol inhaler, prednisone burst.  At this time, I do not feel there is any life-threatening condition present. I have reviewed, interpreted and discussed all results (EKG, imaging, lab, urine as appropriate) and exam findings with patient/family. I have reviewed nursing notes and appropriate previous records.  I feel the patient is safe to be discharged home without further emergent workup and can continue workup as an outpatient as needed. Discussed usual and customary return precautions. Patient/family verbalize understanding and are comfortable with this plan.  Outpatient follow-up has been provided as needed. All questions have been answered.  ____________________________________________   FINAL CLINICAL IMPRESSION(S) / ED DIAGNOSES  Final diagnoses:  Alcohol abuse  Dental caries     ED Discharge Orders         Ordered    multivitamin (ONE-A-DAY MEN'S) TABS tablet  Daily        11/22/20 2324    thiamine 100 MG tablet  Daily        [id number]    folic acid (FOLVITE) 1 MG tablet  Daily        11/22/20 2324    albuterol (VENTOLIN HFA) 108 (90 Base) MCG/ACT inhaler  Every 4 hours PRN        11/22/20 2324    predniSONE  (DELTASONE) 20 MG tablet  Daily        11/22/20 2324          *Please note:  F…LIX RIVERS was evaluated in Emergency Department on 11/22/2020 for the symptoms described in the history of present illness. He was evaluated in the context of the global COVID-19 pandemic, which necessitated consideration that the patient might be at risk for infection with the SARS-CoV-2 virus that  causes COVID-19. Institutional protocols and algorithms that pertain to the evaluation of patients at risk for COVID-19 are in a state of rapid change based on information released by regulatory bodies including the CDC and federal and state organizations. These policies and algorithms were followed during the patient's care in the ED.  Some ED evaluations and interventions may be delayed as a result of limited staffing during and the pandemic.*   Note:  This document was prepared using Dragon voice recognition software and may include unintentional dictation errors.   Dalin Caldera, Delice Bison, DO 11/22/20 2335

## 2020-11-22 NOTE — ED Notes (Signed)
EDP at bedside. Pt expressed wishes to leave. Pt called wife to inform he needs a ride home.

## 2020-11-22 NOTE — ED Triage Notes (Signed)
FIRST NURSE NOTE: Pt here voluntary with [facility] pt from home. Pt calm and cooperative on arrival. Has etoh use hx.

## 2020-11-22 NOTE — ED Notes (Addendum)
Pt reports binge drinking every 3 days. Reportedly drinks a case of beer at a time when he does drink. Last drink 1.5 hours ago. Wants help with detox. States he drinks to aid with chronic pain. Cigarette use. Denies drug use. Disheveled. States he lives at home with wife who is supportive of him stopping etoh use. Reports he was on the ground looking for phone tonight, that is why his pants are so heavily dirtied.

## 2020-12-20 ENCOUNTER — Emergency Department

## 2020-12-20 ENCOUNTER — Encounter: Payer: Self-pay | Admitting: Emergency Medicine

## 2020-12-20 ENCOUNTER — Emergency Department
Admission: EM | Admit: 2020-12-20 | Discharge: 2020-12-20 | Disposition: A | Discharge status: Home / Self Care | Attending: Emergency Medicine | Admitting: Emergency Medicine

## 2020-12-20 ENCOUNTER — Other Ambulatory Visit: Payer: Self-pay

## 2020-12-20 DIAGNOSIS — Y908 Blood alcohol level of 240 mg/100 ml or more: Secondary | ICD-10-CM | POA: Diagnosis not present

## 2020-12-20 DIAGNOSIS — Z79899 Other long term (current) drug therapy: Secondary | ICD-10-CM | POA: Diagnosis not present

## 2020-12-20 DIAGNOSIS — J441 Chronic obstructive pulmonary disease with (acute) exacerbation: Secondary | ICD-10-CM | POA: Diagnosis not present

## 2020-12-20 DIAGNOSIS — F10929 Alcohol use, unspecified with intoxication, unspecified: Secondary | ICD-10-CM | POA: Diagnosis present

## 2020-12-20 DIAGNOSIS — F10230 Alcohol dependence with withdrawal, uncomplicated: Secondary | ICD-10-CM

## 2020-12-20 DIAGNOSIS — F10129 Alcohol abuse with intoxication, unspecified: Secondary | ICD-10-CM | POA: Diagnosis present

## 2020-12-20 DIAGNOSIS — I10 Essential (primary) hypertension: Secondary | ICD-10-CM | POA: Diagnosis not present

## 2020-12-20 DIAGNOSIS — F10920 Alcohol use, unspecified with intoxication, uncomplicated: Secondary | ICD-10-CM

## 2020-12-20 DIAGNOSIS — R451 Restlessness and agitation: Secondary | ICD-10-CM | POA: Diagnosis not present

## 2020-12-20 DIAGNOSIS — F1010 Alcohol abuse, uncomplicated: Secondary | ICD-10-CM | POA: Insufficient documentation

## 2020-12-20 DIAGNOSIS — F17210 Nicotine dependence, cigarettes, uncomplicated: Secondary | ICD-10-CM | POA: Insufficient documentation

## 2020-12-20 LAB — COMPREHENSIVE METABOLIC PANEL
ALT: 41 U/L (ref 0–44)
AST: 43 U/L — ABNORMAL HIGH (ref 15–41)
Albumin: 4.6 g/dL (ref 3.5–5.0)
Alkaline Phosphatase: 83 U/L (ref 38–126)
Anion gap: 11 (ref 5–15)
BUN: 7 mg/dL (ref 6–20)
CO2: 22 mmol/L (ref 22–32)
Calcium: 8.5 mg/dL — ABNORMAL LOW (ref 8.9–10.3)
Chloride: 105 mmol/L (ref 98–111)
Creatinine, Ser: 0.85 mg/dL (ref 0.61–1.24)
GFR, Estimated: >60 mL/min (ref >60)
Glucose, Bld: 113 mg/dL — ABNORMAL HIGH (ref 70–99)
Potassium: 3.9 mmol/L (ref 3.5–5.1)
Sodium: 138 mmol/L (ref 135–145)
Total Bilirubin: 0.8 mg/dL (ref 0.3–1.2)
Total Protein: 8.3 g/dL — ABNORMAL HIGH (ref 6.5–8.1)

## 2020-12-20 LAB — URINE DRUG SCREEN, QUALITATIVE (ARMC ONLY)
Amphetamines, Ur Screen: NOT DETECTED
Barbiturates, Ur Screen: NOT DETECTED
Benzodiazepine, Ur Scrn: NOT DETECTED
Cannabinoid 50 Ng, Ur ~~LOC~~: NOT DETECTED
Cocaine Metabolite,Ur ~~LOC~~: NOT DETECTED
MDMA (Ecstasy)Ur Screen: NOT DETECTED
Methadone Scn, Ur: NOT DETECTED
Opiate, Ur Screen: NOT DETECTED
Phencyclidine (PCP) Ur S: NOT DETECTED
Tricyclic, Ur Screen: NOT DETECTED

## 2020-12-20 LAB — CBC
HCT: 46.9 % (ref 39.0–52.0)
Hemoglobin: 16.1 g/dL (ref 13.0–17.0)
MCH: 33.3 pg (ref 26.0–34.0)
MCHC: 34.3 g/dL (ref 30.0–36.0)
MCV: 97.1 fL (ref 80.0–100.0)
Platelets: 212 10*3/uL (ref 150–400)
RBC: 4.83 MIL/uL (ref 4.22–5.81)
RDW: 12.5 % (ref 11.5–15.5)
WBC: 8.9 10*3/uL (ref 4.0–10.5)
nRBC: 0 % (ref 0.0–0.2)

## 2020-12-20 LAB — ETHANOL: Alcohol, Ethyl (B): 333 mg/dL (ref <10)

## 2020-12-20 LAB — SALICYLATE LEVEL: Salicylate Lvl: <7 mg/dL — ABNORMAL LOW (ref 7.0–30.0)

## 2020-12-20 LAB — ACETAMINOPHEN LEVEL: Acetaminophen (Tylenol), Serum: <10 ug/mL — ABNORMAL LOW (ref 10–30)

## 2020-12-20 MED ORDER — ALBUTEROL SULFATE (2.5 MG/3ML) 0.083% IN NEBU
3.0000 mL | INHALATION_SOLUTION | Freq: Once | RESPIRATORY_TRACT | Status: AC
  Administered 2020-12-20: 3 mL via RESPIRATORY_TRACT
  Filled 2020-12-20: qty 3

## 2020-12-20 MED ORDER — DOXYCYCLINE HYCLATE 100 MG PO TABS: 100.0000 mg | ORAL_TABLET | Freq: Two times a day (BID) | ORAL | 0 refills | Status: AC

## 2020-12-20 MED ORDER — DOXYCYCLINE HYCLATE 100 MG PO TABS: 100.0000 mg | ORAL_TABLET | Freq: Two times a day (BID) | ORAL | Status: DC

## 2020-12-20 MED ORDER — PREDNISONE 50 MG PO TABS: 50.0000 mg | ORAL_TABLET | Freq: Every day | ORAL | 0 refills | Status: AC

## 2020-12-20 MED ORDER — PREDNISONE 20 MG PO TABS: 60.0000 mg | ORAL_TABLET | Freq: Every day | ORAL | Status: DC

## 2020-12-20 MED ORDER — DOXYCYCLINE HYCLATE 100 MG PO TABS
100.0000 mg | ORAL_TABLET | Freq: Once | ORAL | Status: AC
  Administered 2020-12-20: 100 mg via ORAL
  Filled 2020-12-20: qty 1

## 2020-12-20 MED ORDER — PREDNISONE 20 MG PO TABS
60.0000 mg | ORAL_TABLET | Freq: Once | ORAL | Status: AC
  Administered 2020-12-20: 60 mg via ORAL
  Filled 2020-12-20: qty 3

## 2020-12-20 MED ORDER — DOXYCYCLINE HYCLATE 100 MG PO TABS: 100.0000 mg | ORAL_TABLET | Freq: Two times a day (BID) | ORAL | 0 refills | Status: DC

## 2020-12-20 NOTE — ED Notes (Signed)
Dinner tray held at this time while patient is sleeping. Patient declined a [place] sandwich tray earlier, stating "I don't need free food."

## 2020-12-20 NOTE — ED Notes (Signed)
Pt provided warm blanket. Pt stating he called the police on himself but now wants to go home.

## 2020-12-20 NOTE — ED Notes (Signed)
MD made aware for ETOH level of 333

## 2020-12-20 NOTE — ED Notes (Addendum)
Patient stopped a staff and said he is leaving unless "I can go downstairs." Dr. Tamala Julian aware. Patient is not happy he is in a hallway bed.

## 2020-12-20 NOTE — ED Triage Notes (Signed)
Pt in via [facility] for alcohol detox. Pt reports drank a 6-pack this am

## 2020-12-20 NOTE — ED Notes (Signed)
Patient taken to imaging. Patient ambulated to the hallway bathroom with a steady gait.

## 2020-12-20 NOTE — ED Notes (Signed)
Patient ambulated to and from hallway bathroom with a steady gait. Patient appears more calm at this time. Patient declined something to eat, but did agree to a Ginger ale, which he received, but asked that it be put on the floor. When asked how he was doing, if he felt dizzy while walking, Patient just said, "I want to go home. I want to go home now." MD aware.

## 2020-12-20 NOTE — ED Notes (Signed)
Pt becoming verbally aggressive yelling at staff. Pt sitting at edge of bed. MD at bedside.

## 2020-12-20 NOTE — ED Notes (Signed)
Report given to Dawn RN

## 2020-12-20 NOTE — Consult Note (Signed)
[facility] Psych ED Discharge  12/20/2020 3:13 PM Jesse Obrien  MRN:  [id number]  Method of visit?: Face to Face   Principal Problem: Alcohol intoxication [facility]) Discharge Diagnoses: Principal Problem:   Alcohol intoxication ([place])   Subjective: "Call my wife, I'm not staying."  Client reports, "I called the police, I told them I have a drinking problem."  Then, they came and brought him to the ED.  While in the ED, he got upset that he was in a hallway bed and started yelling at the MD with threats to hurt him and himself.  The MD completed an IVC and the client calmed quickly with food and something to drink.  On arrival to his room, the [facility] specialist and this provider was confronted with him telling the security people that he was not putting on purple scrubs as he was going home and to call his wife.  He calmed when they left and he was assured no one was going to make him put on the scrubs.  He was reasonable then and stated he was drinking a "15 pack" when he called the police.  Now, he wants to go home and does not want to get assistance despite trying.  He states he drinks every 3 days and does not experience withdrawal symptoms when he is not drinking.  Denies seizures or other complications.  No suicidal/homicidal ideations, psychosis, or other concerning symptoms.  When asked what has helped him in the past, he responded, "beer."  Psychiatrically cleared for discharge for his wife to pick up.  He was here in May for similar issues.  Total Time spent with patient: 45 minutes  Past Psychiatric History: alcohol use disorder, depression, anxiety  Past Medical History:  Past Medical History:  Diagnosis Date   Alcohol abuse    DJD (degenerative joint disease)    Hypertension 01/02/2015   History reviewed. No pertinent surgical history. Family History: No family history on file. Family Psychiatric  History: none Social History:  Social History   Substance and Sexual Activity  Alcohol Use  Yes     Social History   Substance and Sexual Activity  Drug Use No    Social History   Socioeconomic History   Marital status: Married    Spouse name: Not on file   Number of children: Not on file   Years of education: Not on file   Highest education level: Not on file  Occupational History   Not on file  Tobacco Use   Smoking status: Every Day    Years: 15.00    Pack years: 0.00    Types: Cigarettes   Smokeless tobacco: Never  Substance and Sexual Activity   Alcohol use: Yes   Drug use: No   Sexual activity: Yes    Birth control/protection: Condom  Other Topics Concern   Not on file  Social History Narrative   Not on file   Social Determinants of Health   Financial Resource Strain: Not on file  Food Insecurity: Not on file  Transportation Needs: Not on file  Physical Activity: Not on file  Stress: Not on file  Social Connections: Not on file    Tobacco Cessation:  A prescription for an FDA-approved tobacco cessation medication was offered at discharge and the patient refused  Current Medications: Current Facility-Administered Medications  Medication Dose Route Frequency Provider Last Rate Last Admin   doxycycline (VIBRA-TABS) tablet 100 mg  100 mg Oral Q12H Vladimir Crofts, MD       [  START ON 12/21/2020] predniSONE (DELTASONE) tablet 60 mg  60 mg Oral Q breakfast Vladimir Crofts, MD       Current Outpatient Medications  Medication Sig Dispense Refill   albuterol (VENTOLIN HFA) 108 (90 Base) MCG/ACT inhaler Inhale 2-4 puffs into the lungs every 4 (four) hours as needed for wheezing or shortness of breath. 1 each 1   chlordiazePOXIDE (LIBRIUM) 5 MG capsule Take 6 caps (30 mg) 4x daily on day 1. Take 5 caps (25 mg) 4x daily on day 2. Take 4 caps (20 mg) 4x daily on day 3. Take 3 caps (15 mg) 4x daily on day 4. Take 2 caps (10 mg) 4x daily on day 5. Take 2 caps (10 mg) 3x daily on day 6. Take 1 cap (5 mg) 3x daily on day 7. Take 1 cap (5 mg) twice daily on day 8. Take 1  cap (5 mg) in the evening on day 9. 92 capsule 0   citalopram (CELEXA) 20 MG tablet Take 1 tablet (20 mg total) by mouth daily. 30 tablet 2   folic acid (FOLVITE) 1 MG tablet Take 1 tablet (1 mg total) by mouth daily. 30 tablet 3   multivitamin (ONE-A-DAY MEN'S) TABS tablet Take 1 tablet by mouth daily. 30 tablet 3   pantoprazole (PROTONIX) 40 MG tablet Take 1 tablet (40 mg total) by mouth daily. 30 tablet 2   predniSONE (DELTASONE) 20 MG tablet Take 3 tablets (60 mg total) by mouth daily. 15 tablet 0   thiamine 100 MG tablet Take 1 tablet (100 mg total) by mouth daily. 30 tablet 3   traZODone (DESYREL) 150 MG tablet Take 1 tablet (150 mg total) by mouth at bedtime. 30 tablet 2   PTA Medications: (Not in a hospital admission)   Musculoskeletal: Strength & Muscle Tone: within normal limits Gait & Station: normal Patient leans: N/A  Psychiatric Specialty Exam: Physical Exam Vitals and nursing note reviewed.  Constitutional:      Appearance: Normal appearance.  HENT:     Head: Normocephalic.     Nose: Nose normal.  Pulmonary:     Effort: Pulmonary effort is normal.  Musculoskeletal:        General: Normal range of motion.     Cervical back: Normal range of motion.  Neurological:     General: No focal deficit present.     Mental Status: He is alert and oriented to person, place, and time.  Psychiatric:        Attention and Perception: Attention and perception normal.        Mood and Affect: Mood is anxious.        Speech: Speech normal.        Behavior: Behavior normal. Behavior is cooperative.        Thought Content: Thought content normal.        Cognition and Memory: Cognition is impaired.        Judgment: Judgment normal.    Review of Systems  Psychiatric/Behavioral:  Positive for substance abuse. The patient is nervous/anxious.   All other systems reviewed and are negative.  Blood pressure (!) 140/94, pulse 98, temperature 98.5 F (36.9 C), temperature source Oral,  resp. rate 18, height 5\' 10"  (1.778 m), weight 88.9 kg, SpO2 96 %.Body mass index is 28.12 kg/m.  General Appearance: Casual  Eye Contact:  Good  Speech:  Normal Rate  Volume:  Normal  Mood:  Anxious  Affect:  Appropriate  Thought Process:  Coherent and Descriptions  of Associations: Intact  Orientation:  Full (Time, Place, and Person)  Thought Content:  WDL and Logical  Suicidal Thoughts:  No  Homicidal Thoughts:  No  Memory:  Immediate;   Good Recent;   Good Remote;   Good  Judgement:  Impaired  Insight:  Fair  Psychomotor Activity:  Normal  Concentration:  Concentration: Fair and Attention Span: Fair  Recall:  [place] of Knowledge:  Good  Language:  Good  Akathisia:  No  Handed:  Right  AIMS (if indicated):     Assets:  Housing Leisure Time Physical Health Resilience Social Support  ADL's:  Intact  Cognition:  Impaired,  Mild  Sleep:         Physical Exam: Physical Exam Vitals and nursing note reviewed.  Constitutional:      Appearance: Normal appearance.  HENT:     Head: Normocephalic.     Nose: Nose normal.  Pulmonary:     Effort: Pulmonary effort is normal.  Musculoskeletal:        General: Normal range of motion.     Cervical back: Normal range of motion.  Neurological:     General: No focal deficit present.     Mental Status: He is alert and oriented to person, place, and time.  Psychiatric:        Attention and Perception: Attention and perception normal.        Mood and Affect: Mood is anxious.        Speech: Speech normal.        Behavior: Behavior normal. Behavior is cooperative.        Thought Content: Thought content normal.        Cognition and Memory: Cognition is impaired.        Judgment: Judgment normal.   Review of Systems  Psychiatric/Behavioral:  Positive for substance abuse. The patient is nervous/anxious.   All other systems reviewed and are negative. Blood pressure (!) 140/94, pulse 98, temperature 98.5 F (36.9 C),  temperature source Oral, resp. rate 18, height 5\' 10"  (1.778 m), weight 88.9 kg, SpO2 96 %. Body mass index is 28.12 kg/m.   Demographic Factors:  Male and Caucasian  Loss Factors: NA  Historical Factors: NA  Risk Reduction Factors:   Sense of responsibility to family, Living with another person, especially a relative, and Positive social support  Continued Clinical Symptoms:  Anxiety, mild  Cognitive Features That Contribute To Risk:  None    Suicide Risk:  Minimal: No identifiable suicidal ideation.  Patients presenting with no risk factors but with morbid ruminations; may be classified as minimal risk based on the severity of the depressive symptoms    Plan Of Care/Follow-up recommendations:  Alcohol use disorder: -Refrain from alcohol and drug use -Recommend 12-step program with a sponsor Activity:  as tolerated Diet:  heart healthy diet  Disposition: discharge home Waylan Boga, NP 12/20/2020, 3:13 PM

## 2020-12-20 NOTE — ED Notes (Signed)
Patient reports he would like to go home, MD at bedside. Patient yelling at staff. Patient provided with phone and called wife.

## 2020-12-20 NOTE — BH Assessment (Signed)
Comprehensive Clinical Assessment (CCA) Note  12/20/2020 Jesse Obrien [id number]  [name], 60 year old male who presents to [facility] ED involuntarily for treatment. Per triage note, Pt in via [facility] for alcohol detox. Pt reports drank a 6-pack this am   During TTS assessment pt presents alert and oriented x 4, anxious but cooperative, and mood-congruent with affect. The pt does not appear to be responding to internal or external stimuli. Neither is the pt presenting with any delusional thinking. Pt verified the information provided to triage RN.   Pt identifies his main complaint to be that he has a drinking problem but is ready to go home and does not want to dress out in the scrubs provided by the ED staff. Patient reports he called the police and they brought him to the hospital. Patient states he drinks every 3 days and drank a 15 pack on yesterday. Patient is currently denying detox. Patient reports he has not experienced any seizures or withdrawal symptoms on the days he is not drinking and does not wish for any assistance at this time.  Pt denies SI/HI/AH/VH. Patient reports wanting to call his wife so she can come pick him up.    Per York Cerise, NP pt does not meet criteria for inpatient psychiatric admission.     Chief Complaint:  Chief Complaint  Patient presents with   Alcohol Detox   Visit Diagnosis: Alcohol intoxication    CCA Screening, Triage and Referral (STR)  Patient Reported Information How did you hear about [place]? Self  Referral name: EMS  Referral phone number: No data recorded  Whom do you see for routine medical problems? No data recorded Practice/Facility Name: No data recorded Practice/Facility Phone Number: No data recorded Name of Contact: No data recorded Contact Number: No data recorded Contact Fax Number: No data recorded Prescriber Name: No data recorded Prescriber Address (if known): No data recorded  What Is the Reason for Your Visit/Call Today?  Patient reports he has a drinking problem.  How Long Has This Been Causing You Problems? > than 6 months  What Do You Feel Would Help You the Most Today? Alcohol or Drug Use Treatment   Have You Recently Been in Any Inpatient Treatment (Hospital/Detox/Crisis Center/28-Day Program)? No  Name/Location of Program/Hospital:No data recorded How Long Were You There? No data recorded When Were You Discharged? No data recorded  Have You Ever Received Services From [facility] Before? No data recorded Who Do You See at [facility]? No data recorded  Have You Recently Had Any Thoughts About Hurting Yourself? No  Are You Planning to Commit Suicide/Harm Yourself At This time? No   Have you Recently Had Thoughts About [place]? No  Explanation: No data recorded  Have You Used Any Alcohol or Drugs in the Past 24 Hours? Yes  How Long Ago Did You Use Drugs or Alcohol? No data recorded What Did You Use and How Much? Alcohol. Patient reports he drank 15 pack yesterday 12/19/20.   Do You Currently Have a Therapist/Psychiatrist? No  Name of Therapist/Psychiatrist: No data recorded  Have You Been Recently Discharged From Any Office Practice or Programs? No  Explanation of Discharge From Practice/Program: No data recorded    CCA Screening Triage Referral Assessment Type of Contact: Face-to-Face  Is this Initial or Reassessment? No data recorded Date Telepsych consult ordered in [facility]:  No data recorded Time Telepsych consult ordered in [facility]:  No data recorded  Patient Reported Information Reviewed? Yes  Patient Left Without Being Seen? No data recorded Reason for Not Completing Assessment: No data recorded  Collateral Involvement: None provided   Does Patient Have a [place]? No data recorded Name and Contact of Legal Guardian: No data recorded If Minor and Not Living with Parent(s), Who has Custody? n/a  Is CPS involved or ever been involved?  Never  Is APS involved or ever been involved? Never   Patient Determined To Be At Risk for Harm To Self or Others Based on Review of Patient Reported Information or Presenting Complaint? No  Method: No data recorded Availability of Means: No data recorded Intent: No data recorded Notification Required: No data recorded Additional Information for Danger to Others Potential: No data recorded Additional Comments for Danger to Others Potential: No data recorded Are There Guns or Other Weapons in Your Home? No data recorded Types of Guns/Weapons: No data recorded Are These Weapons Safely Secured?                            No data recorded Who Could Verify You Are Able To Have These Secured: No data recorded Do You Have any Outstanding Charges, Pending Court Dates, Parole/Probation? No data recorded Contacted To Inform of Risk of Harm To Self or Others: No data recorded  Location of Assessment: [facility] ED   Does Patient Present under Involuntary Commitment? No  IVC Papers Initial File Date: No data recorded  [place] of Residence: [place]   Patient Currently Receiving the Following Services: SAIOP (Substance Abuse Intensive Outpatient Program   Determination of Need: Urgent (48 hours)   Options For Referral: Intensive Outpatient Therapy; Outpatient Therapy        Recommendations for Services/Supports/Treatments:    DSM5 Diagnoses: Patient Active Problem List   Diagnosis Date Noted   Alcohol intoxication ([place]) 12/20/2020   Tobacco use disorder 10/28/2015   GERD (gastroesophageal reflux disease) 10/28/2015   Alcohol dependence with uncomplicated withdrawal ([place]) 03/20/2015   DDD (degenerative disc disease), lumbar 03/20/2015   Osteoarthrosis, unspecified whether generalized or localized, involving lower leg 03/20/2015   Spinal stenosis of lumbar region 69/79/4801   Self inflicted neck laceration 01/02/2015   Hypertension 01/02/2015    Patient Centered Plan: Patient  is on the following Treatment Plan(s):  Substance Abuse   Referrals to Alternative Service(s): Referred to Alternative Service(s):   Place:   Date:   Time:    Referred to Alternative Service(s):   Place:   Date:   Time:    Referred to Alternative Service(s):   Place:   Date:   Time:    Referred to Alternative Service(s):   Place:   Date:   Time:     Tariah Transue Glennon Mac, Counselor, LCAS-A

## 2020-12-20 NOTE — ED Provider Notes (Signed)
[facility] Emergency Department Provider Note ____________________________________________   Event Date/Time   First MD Initiated Contact with Patient 12/20/20 1150     (approximate)  I have reviewed the triage vital signs and the nursing notes.  HISTORY  Chief Complaint Alcohol Detox   HPI Jesse Obrien is a 60 y.o. malewho presents to the ED for evaluation of alcohol abuse  Chart review indicates history of HTN, alcohol abuse and continued cigarette smoking.  Patient presents to the ED voluntarily, transported by law enforcement, for evaluation of his alcohol abuse.  Patient reports drinking many beers per day, 6-12 beers per day.  Denies additional recreational drugs.  Denies suicidal intent, homicidality or hallucinations on my initial evaluation.  He does report feeling short of breath and his cough has been increased from normal, further reports increased sputum production as well.  Denies fevers, syncopal episodes, chest pain, abdominal pain or emesis.  Reports having albuterol at home and continued cigarette smoking without recent antibiotics or steroids.   Past Medical History:  Diagnosis Date   Alcohol abuse    DJD (degenerative joint disease)    Hypertension 01/02/2015    Patient Active Problem List   Diagnosis Date Noted   Alcohol intoxication ([place]) 12/20/2020   Tobacco use disorder 10/28/2015   GERD (gastroesophageal reflux disease) 10/28/2015   Alcohol dependence with uncomplicated withdrawal ([place]) 03/20/2015   DDD (degenerative disc disease), lumbar 03/20/2015   Osteoarthrosis, unspecified whether generalized or localized, involving lower leg 03/20/2015   Spinal stenosis of lumbar region 91/79/1505   Self inflicted neck laceration 01/02/2015   Hypertension 01/02/2015    History reviewed. No pertinent surgical history.  Prior to Admission medications   Medication Sig Start Date End Date Taking? Authorizing Provider  predniSONE  (DELTASONE) 50 MG tablet Take 1 tablet (50 mg total) by mouth daily for 4 days. 12/20/20 12/24/20 Yes Vladimir Crofts, MD  albuterol (VENTOLIN HFA) 108 (90 Base) MCG/ACT inhaler Inhale 2-4 puffs into the lungs every 4 (four) hours as needed for wheezing or shortness of breath. 11/22/20   Ward, Delice Bison, DO  citalopram (CELEXA) 20 MG tablet Take 1 tablet (20 mg total) by mouth daily. 06/24/19 06/23/20  Hinda Kehr, MD  doxycycline (VIBRA-TABS) 100 MG tablet Take 1 tablet (100 mg total) by mouth 2 (two) times daily for 5 days. 12/20/20 12/25/20  Vladimir Crofts, MD  folic acid (FOLVITE) 1 MG tablet Take 1 tablet (1 mg total) by mouth daily. 11/22/20 11/22/21  Ward, Delice Bison, DO  multivitamin (ONE-A-DAY MEN'S) TABS tablet Take 1 tablet by mouth daily. 11/22/20   Ward, Delice Bison, DO  pantoprazole (PROTONIX) 40 MG tablet Take 1 tablet (40 mg total) by mouth daily. 06/24/19   Hinda Kehr, MD  predniSONE (DELTASONE) 20 MG tablet Take 3 tablets (60 mg total) by mouth daily. 11/22/20   Ward, Delice Bison, DO  thiamine 100 MG tablet Take 1 tablet (100 mg total) by mouth daily. 11/22/20   Ward, Delice Bison, DO  traZODone (DESYREL) 150 MG tablet Take 1 tablet (150 mg total) by mouth at bedtime. 06/24/19   Hinda Kehr, MD    Allergies Patient has no known allergies.  No family history on file.  Social History Social History   Tobacco Use   Smoking status: Every Day    Years: 15.00    Pack years: 0.00    Types: Cigarettes   Smokeless tobacco: Never  Substance Use Topics   Alcohol use: Yes  Drug use: No    Review of Systems  Constitutional: No fever/chills Eyes: No visual changes. ENT: No sore throat. Cardiovascular: Denies chest pain. Respiratory: Positive shortness of breath, cough and productive cough. Gastrointestinal: No abdominal pain.  No nausea, no vomiting.  No diarrhea.  No constipation. Genitourinary: Negative for dysuria. Musculoskeletal: Negative for back pain. Skin: Negative for  rash. Neurological: Negative for headaches, focal weakness or numbness.  ____________________________________________   PHYSICAL EXAM:  VITAL SIGNS: Vitals:   12/20/20 1143  BP: (!) 140/94  Pulse: 98  Resp: 18  Temp: 98.5 F (36.9 C)  SpO2: 96%     Constitutional: Alert and oriented.  Clinic intoxicated and slurring his speech. Eyes: Conjunctivae are normal. PERRL. EOMI. Head: Atraumatic. Nose: No congestion/rhinnorhea. Mouth/Throat: Mucous membranes are moist.  Oropharynx non-erythematous. Neck: No stridor. No cervical spine tenderness to palpation. Cardiovascular: Normal rate, regular rhythm. Grossly normal heart sounds.  Good peripheral circulation. Respiratory: Slight tachypnea to the low 20s.  No further evidence of distress.  Diffuse operatory wheezes and moderately movement throughout. Gastrointestinal: Soft , nondistended, nontender to palpation. No CVA tenderness. Musculoskeletal: No lower extremity tenderness nor edema.  No joint effusions. No signs of acute trauma. Neurologic:  Normal speech and language. No gross focal neurologic deficits are appreciated. No gait instability noted. Skin:  Skin is warm, dry and intact. No rash noted. Psychiatric: Mood and affect are normal. Speech and behavior are normal.  ____________________________________________   LABS (all labs ordered are listed, but only abnormal results are displayed)  Labs Reviewed  COMPREHENSIVE METABOLIC PANEL - Abnormal; Notable for the following components:      Result Value   Glucose, Bld 113 (*)    Calcium 8.5 (*)    Total Protein 8.3 (*)    AST 43 (*)    All other components within normal limits  ETHANOL - Abnormal; Notable for the following components:   Alcohol, Ethyl (B) 333 (*)    All other components within normal limits  SALICYLATE LEVEL - Abnormal; Notable for the following components:   Salicylate Lvl <9.8 (*)    All other components within normal limits  ACETAMINOPHEN LEVEL -  Abnormal; Notable for the following components:   Acetaminophen (Tylenol), Serum <10 (*)    All other components within normal limits  CBC  URINE DRUG SCREEN, QUALITATIVE ([facility] ONLY)   ____________________________________________  12 Lead EKG  Sinus rhythm, rate of 90 bpm.  Normal axis and intervals.  No STEMI. ____________________________________________  RADIOLOGY  ED MD interpretation: 2 view CXR reviewed by me without evidence of acute cardiopulmonary pathology.  Official radiology report(s): DG Chest 2 View  Result Date: 12/20/2020 CLINICAL DATA:  COPD exacerbation. EXAM: CHEST - 2 VIEW COMPARISON:  September 05, 2019 FINDINGS: Cardiomediastinal silhouette is normal. Mediastinal contours appear intact. There is no evidence of focal airspace consolidation, pleural effusion or pneumothorax. Osseous structures are without acute abnormality. Soft tissues are grossly normal. IMPRESSION: No active cardiopulmonary disease. Electronically Signed   By: Fidela Salisbury M.D.   On: 12/20/2020 13:34    ____________________________________________   PROCEDURES and INTERVENTIONS  Procedure(s) performed (including Critical Care):  Procedures  Medications  doxycycline (VIBRA-TABS) tablet 100 mg (has no administration in time range)  predniSONE (DELTASONE) tablet 60 mg (has no administration in time range)  doxycycline (VIBRA-TABS) tablet 100 mg (100 mg Oral Given 12/20/20 1402)  predniSONE (DELTASONE) tablet 60 mg (60 mg Oral Given 12/20/20 1402)  albuterol (PROVENTIL) (2.5 MG/3ML) 0.083% nebulizer solution 3 mL (3  mLs Inhalation Given 12/20/20 1403)    ____________________________________________   MDM / ED COURSE   60 year old alcoholic gentleman presents to the ED with evidence of COPD exacerbation and waxing/waning agitation.  Initially presents quite cooperative and clinically intoxicated.  Further presents with evidence of COPD exacerbation, for which she received breathing  treatments, steroids and doxycycline due to increased pedal production.  Improved respiratory status after these treatments.  Upon my reassessment, he is quite agitated and threatening, as below, I therefore IVC the patient have psychiatry see him.  They probably discontinue the IVC as he is sobering up and has no further evidence of agitation.  We will discharge with return precautions and medications for treatment of his COPD exacerbation.  Clinical Course as of 12/20/20 1530  Sun Dec 20, 2020  1255 Called wife, April, she doesn't know why he's here. He's just been drinking a lot at home. 24 beers or so in a day. NO other concerns [DS]  8295 Went to reassess the patient.  His respirations have slowed and wheezing has improved.  He is getting off the phone with his wife as I walked up to him, and he tells her to come to the ED to pick him up.  I informed the patient he has not been discharged yet and I am uncomfortable with him leaving due to his continued ethanol intoxication.  He becomes quite agitated, irate, yelling, threatening me.  I tried to redirect him and informed him that this is unacceptable behavior, but he is only becoming increasingly agitated.  He is continuing to threaten me with personal harm, as well as making vague threats of hurting himself and continue drinking at home.  I think would be prudent to IVC the patient to keep him here.  His wife was updated of this and indicates that this is fine and probably for the best. [DS]    Clinical Course User Index [DS] Vladimir Crofts, MD    ____________________________________________   FINAL CLINICAL IMPRESSION(S) / ED DIAGNOSES  Final diagnoses:  COPD with acute exacerbation ([place])  Agitated  Alcohol abuse  Alcohol dependence with uncomplicated withdrawal [facility])     ED Discharge Orders          Ordered    doxycycline (VIBRA-TABS) 100 MG tablet  Every 12 hours,   Status:  Discontinued        12/20/20 1526    Increase activity  slowly        12/20/20 1526    Diet - low sodium heart healthy        12/20/20 1526    Discharge instructions       Comments: Discharge home follow up with AA   12/20/20 1526    doxycycline (VIBRA-TABS) 100 MG tablet  2 times daily        12/20/20 1528    predniSONE (DELTASONE) 50 MG tablet  Daily        12/20/20 1528             Ragena Fiola   Note:  This document was prepared using Systems analyst and may include unintentional dictation errors.    Vladimir Crofts, MD 12/20/20 [phone]

## 2020-12-20 NOTE — ED Notes (Signed)
Patient is sleeping at this time. Dr. Tamala Julian and Gust Rung NP advised not to discharge patient for awhile and to let him sleep.

## 2020-12-20 NOTE — ED Notes (Addendum)
Patient refused to change into burgundy scrubs. Dr. Tamala Julian is aware. Patient was scanned by security.

## 2020-12-20 NOTE — ED Notes (Signed)
Patient reports he will stay calm and cooperative with staff, officer at bedside.

## 2020-12-20 NOTE — ED Notes (Signed)
Gust Rung, NP is at bedside along with [facility] TTS.

## 2020-12-20 NOTE — Discharge Instructions (Addendum)
Follow up with AA with a sponsor

## 2020-12-20 NOTE — ED Notes (Signed)
Dr. Smith at bedside.

## 2021-01-28 ENCOUNTER — Other Ambulatory Visit: Payer: Self-pay

## 2021-01-28 ENCOUNTER — Emergency Department

## 2021-01-28 ENCOUNTER — Emergency Department
Admission: EM | Admit: 2021-01-28 | Discharge: 2021-01-28 | Disposition: A | Discharge status: Home / Self Care | Attending: Emergency Medicine | Admitting: Emergency Medicine

## 2021-01-28 DIAGNOSIS — J449 Chronic obstructive pulmonary disease, unspecified: Secondary | ICD-10-CM

## 2021-01-28 DIAGNOSIS — F10230 Alcohol dependence with withdrawal, uncomplicated: Secondary | ICD-10-CM | POA: Diagnosis not present

## 2021-01-28 DIAGNOSIS — F17210 Nicotine dependence, cigarettes, uncomplicated: Secondary | ICD-10-CM | POA: Diagnosis not present

## 2021-01-28 DIAGNOSIS — Y908 Blood alcohol level of 240 mg/100 ml or more: Secondary | ICD-10-CM | POA: Insufficient documentation

## 2021-01-28 DIAGNOSIS — I10 Essential (primary) hypertension: Secondary | ICD-10-CM | POA: Insufficient documentation

## 2021-01-28 DIAGNOSIS — F10120 Alcohol abuse with intoxication, uncomplicated: Secondary | ICD-10-CM | POA: Diagnosis present

## 2021-01-28 DIAGNOSIS — F10920 Alcohol use, unspecified with intoxication, uncomplicated: Secondary | ICD-10-CM

## 2021-01-28 DIAGNOSIS — F1020 Alcohol dependence, uncomplicated: Secondary | ICD-10-CM | POA: Diagnosis present

## 2021-01-28 LAB — ACETAMINOPHEN LEVEL: Acetaminophen (Tylenol), Serum: <10 ug/mL — ABNORMAL LOW (ref 10–30)

## 2021-01-28 LAB — COMPREHENSIVE METABOLIC PANEL
ALT: 33 U/L (ref 0–44)
AST: 37 U/L (ref 15–41)
Albumin: 4.2 g/dL (ref 3.5–5.0)
Alkaline Phosphatase: 68 U/L (ref 38–126)
Anion gap: 8 (ref 5–15)
BUN: 8 mg/dL (ref 6–20)
CO2: 19 mmol/L — ABNORMAL LOW (ref 22–32)
Calcium: 8.5 mg/dL — ABNORMAL LOW (ref 8.9–10.3)
Chloride: 108 mmol/L (ref 98–111)
Creatinine, Ser: 0.8 mg/dL (ref 0.61–1.24)
GFR, Estimated: >60 mL/min (ref >60)
Glucose, Bld: 109 mg/dL — ABNORMAL HIGH (ref 70–99)
Potassium: 3.8 mmol/L (ref 3.5–5.1)
Sodium: 135 mmol/L (ref 135–145)
Total Bilirubin: 0.7 mg/dL (ref 0.3–1.2)
Total Protein: 7.3 g/dL (ref 6.5–8.1)

## 2021-01-28 LAB — CBC WITH DIFFERENTIAL/PLATELET
Abs Immature Granulocytes: 0.1 10*3/uL — ABNORMAL HIGH (ref 0.00–0.07)
Basophils Absolute: 0 10*3/uL (ref 0.0–0.1)
Basophils Relative: 0 %
Eosinophils Absolute: 0.3 10*3/uL (ref 0.0–0.5)
Eosinophils Relative: 3 %
HCT: 44.1 % (ref 39.0–52.0)
Hemoglobin: 15.1 g/dL (ref 13.0–17.0)
Immature Granulocytes: 1 %
Lymphocytes Relative: 43 %
Lymphs Abs: 4.7 10*3/uL — ABNORMAL HIGH (ref 0.7–4.0)
MCH: 34 pg (ref 26.0–34.0)
MCHC: 34.2 g/dL (ref 30.0–36.0)
MCV: 99.3 fL (ref 80.0–100.0)
Monocytes Absolute: 1 10*3/uL (ref 0.1–1.0)
Monocytes Relative: 10 %
Neutro Abs: 4.6 10*3/uL (ref 1.7–7.7)
Neutrophils Relative %: 43 %
Platelets: 165 10*3/uL (ref 150–400)
RBC: 4.44 MIL/uL (ref 4.22–5.81)
RDW: 12.6 % (ref 11.5–15.5)
Smear Review: NORMAL
WBC: 10.9 10*3/uL — ABNORMAL HIGH (ref 4.0–10.5)
nRBC: 0 % (ref 0.0–0.2)

## 2021-01-28 LAB — ETHANOL: Alcohol, Ethyl (B): 334 mg/dL (ref <10)

## 2021-01-28 LAB — TROPONIN I (HIGH SENSITIVITY)
Troponin I (High Sensitivity): 6 ng/L (ref <18)
Troponin I (High Sensitivity): 7 ng/L (ref <18)

## 2021-01-28 LAB — SALICYLATE LEVEL: Salicylate Lvl: <7 mg/dL — ABNORMAL LOW (ref 7.0–30.0)

## 2021-01-28 MED ORDER — ADULT MULTIVITAMIN W/MINERALS CH
1.0000 | ORAL_TABLET | Freq: Every day | ORAL | Status: DC
  Administered 2021-01-28: 1 via ORAL
  Filled 2021-01-28: qty 1

## 2021-01-28 MED ORDER — ALBUTEROL SULFATE (2.5 MG/3ML) 0.083% IN NEBU: 3.0000 mL | INHALATION_SOLUTION | RESPIRATORY_TRACT | Status: DC | PRN

## 2021-01-28 MED ORDER — HALOPERIDOL LACTATE 5 MG/ML IJ SOLN
5.0000 mg | Freq: Once | INTRAMUSCULAR | Status: AC
  Administered 2021-01-28: 5 mg via INTRAMUSCULAR
  Filled 2021-01-28: qty 1

## 2021-01-28 MED ORDER — LORAZEPAM 2 MG/ML IJ SOLN
0.0000 mg | Freq: Four times a day (QID) | INTRAMUSCULAR | Status: DC
Start: 2021-01-28 — End: 2021-01-28

## 2021-01-28 MED ORDER — LORAZEPAM 2 MG PO TABS: 0.0000 mg | ORAL_TABLET | Freq: Two times a day (BID) | ORAL | Status: DC

## 2021-01-28 MED ORDER — ALBUTEROL SULFATE HFA 108 (90 BASE) MCG/ACT IN AERS
2.0000 | INHALATION_SPRAY | RESPIRATORY_TRACT | Status: AC
  Administered 2021-01-28: 2 via RESPIRATORY_TRACT
  Filled 2021-01-28: qty 6.7

## 2021-01-28 MED ORDER — FOLIC ACID 1 MG PO TABS
1.0000 mg | ORAL_TABLET | Freq: Every day | ORAL | Status: DC
  Administered 2021-01-28: 1 mg via ORAL
  Filled 2021-01-28: qty 1

## 2021-01-28 MED ORDER — CITALOPRAM HYDROBROMIDE 20 MG PO TABS
20.0000 mg | ORAL_TABLET | Freq: Every day | ORAL | Status: DC
  Administered 2021-01-28: 20 mg via ORAL
  Filled 2021-01-28: qty 1

## 2021-01-28 MED ORDER — PANTOPRAZOLE SODIUM 40 MG PO TBEC
40.0000 mg | DELAYED_RELEASE_TABLET | Freq: Every day | ORAL | Status: DC
  Administered 2021-01-28: 40 mg via ORAL
  Filled 2021-01-28: qty 1

## 2021-01-28 MED ORDER — ALBUTEROL SULFATE HFA 108 (90 BASE) MCG/ACT IN AERS: 2.0000 | INHALATION_SPRAY | Freq: Four times a day (QID) | RESPIRATORY_TRACT | 1 refills | Status: AC | PRN

## 2021-01-28 MED ORDER — THIAMINE HCL 100 MG/ML IJ SOLN: 100.0000 mg | Freq: Every day | INTRAMUSCULAR | Status: DC

## 2021-01-28 MED ORDER — THIAMINE HCL 100 MG PO TABS
100.0000 mg | ORAL_TABLET | Freq: Every day | ORAL | Status: DC
  Administered 2021-01-28: 100 mg via ORAL
  Filled 2021-01-28: qty 1

## 2021-01-28 MED ORDER — LORAZEPAM 2 MG/ML IJ SOLN: 0.0000 mg | Freq: Two times a day (BID) | INTRAMUSCULAR | Status: DC

## 2021-01-28 MED ORDER — TRAZODONE HCL 50 MG PO TABS: 150.0000 mg | ORAL_TABLET | Freq: Every day | ORAL | Status: DC

## 2021-01-28 MED ORDER — LORAZEPAM 2 MG PO TABS: 0.0000 mg | ORAL_TABLET | Freq: Four times a day (QID) | ORAL | Status: DC

## 2021-01-28 NOTE — ED Provider Notes (Signed)
[facility] Emergency Department Provider Note  ____________________________________________   Event Date/Time   First MD Initiated Contact with Patient 01/28/21 0201     (approximate)  I have reviewed the triage vital signs and the nursing notes.   HISTORY  Chief Complaint Alcohol Intoxication    HPI Jesse Obrien is a 60 y.o. male history of hypertension, alcohol abuse who presents to the emergency department via EMS intoxicated.  Patient is unable to reliably tell me why he called 911.  Initially tells me that he thought he was having a stroke but cannot explain this any further.  Then he tells me that he was having chest pain.  Again unable to describe this any further.  Then stated he was having lower abdominal pain.        Past Medical History:  Diagnosis Date   Alcohol abuse    DJD (degenerative joint disease)    Hypertension 01/02/2015    Patient Active Problem List   Diagnosis Date Noted   Alcohol intoxication ([place]) 12/20/2020   Tobacco use disorder 10/28/2015   GERD (gastroesophageal reflux disease) 10/28/2015   Alcohol dependence with uncomplicated withdrawal ([place]) 03/20/2015   DDD (degenerative disc disease), lumbar 03/20/2015   Osteoarthrosis, unspecified whether generalized or localized, involving lower leg 03/20/2015   Spinal stenosis of lumbar region [id number]   Self inflicted neck laceration 01/02/2015   Hypertension 01/02/2015    History reviewed. No pertinent surgical history.  Prior to Admission medications   Medication Sig Start Date End Date Taking? Authorizing Provider  albuterol (VENTOLIN HFA) 108 (90 Base) MCG/ACT inhaler Inhale 2-4 puffs into the lungs every 4 (four) hours as needed for wheezing or shortness of breath. 11/22/20   Kandice Schmelter, Delice Bison, DO  citalopram (CELEXA) 20 MG tablet Take 1 tablet (20 mg total) by mouth daily. 06/24/19 06/23/20  Hinda Kehr, MD  folic acid (FOLVITE) 1 MG tablet Take 1 tablet (1 mg  total) by mouth daily. 11/22/20 11/22/21  Gideon Burstein, Delice Bison, DO  multivitamin (ONE-A-DAY MEN'S) TABS tablet Take 1 tablet by mouth daily. 11/22/20   Kevyn Wengert, Delice Bison, DO  pantoprazole (PROTONIX) 40 MG tablet Take 1 tablet (40 mg total) by mouth daily. 06/24/19   Hinda Kehr, MD  predniSONE (DELTASONE) 20 MG tablet Take 3 tablets (60 mg total) by mouth daily. 11/22/20   Doss Cybulski, Delice Bison, DO  thiamine 100 MG tablet Take 1 tablet (100 mg total) by mouth daily. 11/22/20   Jacki Couse, Delice Bison, DO  traZODone (DESYREL) 150 MG tablet Take 1 tablet (150 mg total) by mouth at bedtime. 06/24/19   Hinda Kehr, MD    Allergies Patient has no known allergies.  No family history on file.  Social History Social History   Tobacco Use   Smoking status: Every Day    Years: 15.00    Types: Cigarettes   Smokeless tobacco: Never  Substance Use Topics   Alcohol use: Yes   Drug use: No    Review of Systems Level 5 caveat secondary to intoxication  ____________________________________________   PHYSICAL EXAM:  VITAL SIGNS: ED Triage Vitals  Enc Vitals Group     BP 01/28/21 0207 115/89     Pulse Rate 01/28/21 0207 92     Resp 01/28/21 0207 18     Temp 01/28/21 0207 98.4 F (36.9 C)     Temp Source 01/28/21 0207 Oral     SpO2 01/28/21 0207 95 %     Weight 01/28/21 0221 275  lb (124.7 kg)     Height 01/28/21 0221 '5\' 11"'$  (1.803 m)     Head Circumference --      Peak Flow --      Pain Score 01/28/21 0221 0     Pain Loc --      Pain Edu? --      Excl. in [place]? --    CONSTITUTIONAL: Alert and oriented and responds appropriately to questions.  Intoxicated, chronically ill-appearing, appears older than stated age HEAD: Normocephalic EYES: Conjunctivae clear, pupils appear equal, EOM appear intact ENT: normal nose; moist mucous membranes NECK: Supple, normal ROM CARD: RRR; S1 and S2 appreciated; no murmurs, no clicks, no rubs, no gallops RESP: Normal chest excursion without splinting or tachypnea; breath  sounds clear and equal bilaterally; no wheezes, no rhonchi, no rales, no hypoxia or respiratory distress, speaking full sentences ABD/GI: Normal bowel sounds; non-distended; soft, non-tender, no rebound, no guarding, no peritoneal signs, no hepatosplenomegaly, no tenderness at McBurney's point BACK: The back appears normal EXT: Normal ROM in all joints; no deformity noted, no edema; no cyanosis SKIN: Normal color for age and race; warm; no rash on exposed skin NEURO: Moves all extremities equally, ambulatory but seems unsteady on his feet, slurred speech, no facial asymmetry PSYCH: Intermittently agitated, yelling.  ____________________________________________   LABS (all labs ordered are listed, but only abnormal results are displayed)  Labs Reviewed  CBC WITH DIFFERENTIAL/PLATELET - Abnormal; Notable for the following components:      Result Value   WBC 10.9 (*)    All other components within normal limits  COMPREHENSIVE METABOLIC PANEL  ETHANOL  URINE DRUG SCREEN, QUALITATIVE ([facility] ONLY)  ACETAMINOPHEN LEVEL  SALICYLATE LEVEL  TROPONIN I (HIGH SENSITIVITY)   ____________________________________________  EKG   EKG Interpretation  Date/Time:  Thursday January 28 2021 02:35:02 EDT Ventricular Rate:  97 PR Interval:  185 QRS Duration: 87 QT Interval:  346 QTC Calculation: 440 R Axis:   69 Text Interpretation: Sinus rhythm Anteroseptal infarct, age indeterminate ST elevation, consider inferior injury Baseline wander in lead(s) II No significant change since last tracing Confirmed by Pryor Curia [phone]) on 01/28/2021 2:40:58 AM         ____________________________________________  RADIOLOGY Jessie Foot Bassy Fetterly, personally viewed and evaluated these images (plain radiographs) as part of my medical decision making, as well as reviewing the written report by the radiologist.  ED MD interpretation: Chest x-ray clear.  Official radiology report(s): DG Chest Portable 1  View  Result Date: 01/28/2021 CLINICAL DATA:  Chest pain. EXAM: PORTABLE CHEST 1 VIEW COMPARISON:  December 20, 2020 FINDINGS: The heart size and mediastinal contours are within normal limits. Both lungs are clear. The visualized skeletal structures are unremarkable. IMPRESSION: No active disease. Electronically Signed   By: Virgina Norfolk M.D.   On: 01/28/2021 02:39    ____________________________________________   PROCEDURES  Procedure(s) performed (including Critical Care):  Procedures   ____________________________________________   INITIAL IMPRESSION / ASSESSMENT AND PLAN / ED COURSE  As part of my medical decision making, I reviewed the following data within the [facility] notes reviewed and incorporated, Labs reviewed , EKG interpreted , Old EKG reviewed, Old chart reviewed, Radiograph reviewed  and Notes from prior ED visits         Patient here intoxicated with multiple different complaints.  Unable to obtain clear history from patient.  Initially states he thought he was having a stroke but unable to tell me why he thought this.  I do not appreciate any obvious neurologic deficits other than slurred speech but I suspect this is due to intoxication.  Unable to reliably test sensation.  Also complaining of chest pain.  His EKG shows no new ischemic change.  Hemodynamically stable without tachycardia, tachypnea or hypoxia.  Will obtain cardiac enzymes, chest x-ray.  Doubt PE, dissection.  Patient also complaining of abdominal pain but his abdominal exam today is benign.  Patient will need to be reassessed when clinically sober.  ED PROGRESS  Patient becoming increasingly agitated and difficult to redirect.  Given he is so intoxicated, he is a fall risk.  Will give IM Haldol.  6:45 AM  Pt sleeping, hemodynamically stable.  Labs unremarkable.  2 normal troponins.  Chest x-ray clear.  Patient will need to be reassessed when clinically sober.   I reviewed  all nursing notes and pertinent previous records as available.  I have reviewed and interpreted any EKGs, lab and urine results, imaging (as available).  ____________________________________________   FINAL CLINICAL IMPRESSION(S) / ED DIAGNOSES  Final diagnoses:  Alcoholic intoxication without complication [facility])     ED Discharge Orders     None       *Please note:  Jesse Obrien was evaluated in Emergency Department on 01/28/2021 for the symptoms described in the history of present illness. He was evaluated in the context of the global COVID-19 pandemic, which necessitated consideration that the patient might be at risk for infection with the SARS-CoV-2 virus that causes COVID-19. Institutional protocols and algorithms that pertain to the evaluation of patients at risk for COVID-19 are in a state of rapid change based on information released by regulatory bodies including the CDC and federal and state organizations. These policies and algorithms were followed during the patient's care in the ED.  Some ED evaluations and interventions may be delayed as a result of limited staffing during and the pandemic.*   Note:  This document was prepared using Dragon voice recognition software and may include unintentional dictation errors.    Samari Gorby, Delice Bison, DO 01/28/21 [phone]

## 2021-01-28 NOTE — ED Notes (Signed)
pts belongings:  Jeans Black t shirt Red phone Red plaid underwear [employer]

## 2021-01-28 NOTE — Consult Note (Signed)
[facility] Face-to-Face Psychiatry Consult   Reason for Consult: Consult for 60 year old man with a history of alcohol abuse well-known to the emergency room Referring Physician: Bland Span Patient Identification: Jesse Obrien MRN:  [id number] Principal Diagnosis: Alcohol dependence with uncomplicated withdrawal ([place]) Diagnosis:  Principal Problem:   Alcohol dependence with uncomplicated withdrawal ([place]) Active Problems:   COPD (chronic obstructive pulmonary disease) ([place])   Total Time spent with patient: 1 hour  Subjective:   Jesse Obrien is a 60 y.o. male patient admitted with "I want to go home".  HPI: Patient seen chart reviewed.  60 year old man well-known to the emergency room came into the hospital last night initially apparently because he was having chest pain.  He says that is why he called 911.  Blood alcohol level over 300.  On evaluation today the patient is clinically sobering up.  He tells me he wants to go home.  He admits that he continues to drink daily and that he does it to help with his "nerves" which has long been his rationale for it.  He acknowledges that it is a bad habit and that he needs to stop it and he knows it is causing him medical problems.  Denies that he has been using any other drugs.  Patient says he is not currently following up with any outpatient care including primary care doctor.  He denies any current suicidal or homicidal ideation.  Denies any hallucinations or psychotic symptoms.  Still living at home with his wife.  Past Psychiatric History: Multiple presentations to the ER intoxicated.  On a couple of occasions at least he has cut himself also when he was drunk.  By the time he sobers up always minimizes and denies symptoms and does not cooperate with recommended outpatient treatment.  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  Risk to Self:   Risk to Others:   Prior  Inpatient Therapy:   Prior Outpatient Therapy:    Past Medical History:  Past Medical History:  Diagnosis Date   Alcohol abuse    DJD (degenerative joint disease)    Hypertension 01/02/2015   History reviewed. No pertinent surgical history. Family History: No family history on file. Family Psychiatric  History: See previous Social History:  Social History   Substance and Sexual Activity  Alcohol Use Yes     Social History   Substance and Sexual Activity  Drug Use No    Social History   Socioeconomic History   Marital status: Married    Spouse name: Not on file   Number of children: Not on file   Years of education: Not on file   Highest education level: Not on file  Occupational History   Not on file  Tobacco Use   Smoking status: Every Day    Years: 15.00    Types: Cigarettes   Smokeless tobacco: Never  Substance and Sexual Activity   Alcohol use: Yes   Drug use: No   Sexual activity: Yes    Birth control/protection: Condom  Other Topics Concern   Not on file  Social History Narrative   Not on file   Social Determinants of Health   Financial Resource Strain: Not on file  Food Insecurity: Not on file  Transportation Needs: Not on file  Physical Activity: Not on file  Stress: Not on file  Social Connections: Not on file   Additional Social  History:    Allergies:  No Known Allergies  Labs:  Results for orders placed or performed during the hospital encounter of 01/28/21 (from the past 48 hour(s))  CBC with Differential/Platelet     Status: Abnormal   Collection Time: 01/28/21  2:18 AM  Result Value Ref Range   WBC 10.9 (H) 4.0 - 10.5 K/uL   RBC 4.44 4.22 - 5.81 MIL/uL   Hemoglobin 15.1 13.0 - 17.0 g/dL   HCT 44.1 39.0 - 52.0 %   MCV 99.3 80.0 - 100.0 fL   MCH 34.0 26.0 - 34.0 pg   MCHC 34.2 30.0 - 36.0 g/dL   RDW 12.6 11.5 - 15.5 %   Platelets 165 150 - 400 K/uL   nRBC 0.0 0.0 - 0.2 %   Neutrophils Relative % 43 %   Neutro Abs 4.6 1.7 - 7.7  K/uL   Lymphocytes Relative 43 %   Lymphs Abs 4.7 (H) 0.7 - 4.0 K/uL   Monocytes Relative 10 %   Monocytes Absolute 1.0 0.1 - 1.0 K/uL   Eosinophils Relative 3 %   Eosinophils Absolute 0.3 0.0 - 0.5 K/uL   Basophils Relative 0 %   Basophils Absolute 0.0 0.0 - 0.1 K/uL   WBC Morphology MORPHOLOGY UNREMARKABLE    RBC Morphology MORPHOLOGY UNREMARKABLE    Smear Review Normal platelet morphology    Immature Granulocytes 1 %   Abs Immature Granulocytes 0.10 (H) 0.00 - 0.07 K/uL    Comment: Performed at [facility] [address] [place], [place] [address]  Comprehensive metabolic panel     Status: Abnormal   Collection Time: 01/28/21  2:18 AM  Result Value Ref Range   Sodium 135 135 - 145 mmol/L   Potassium 3.8 3.5 - 5.1 mmol/L   Chloride 108 98 - 111 mmol/L   CO2 19 (L) 22 - 32 mmol/L   Glucose, Bld 109 (H) 70 - 99 mg/dL    Comment: Glucose reference range applies only to samples taken after fasting for at least 8 hours.   BUN 8 6 - 20 mg/dL   Creatinine, Ser 0.80 0.61 - 1.24 mg/dL   Calcium 8.5 (L) 8.9 - 10.3 mg/dL   Total Protein 7.3 6.5 - 8.1 g/dL   Albumin 4.2 3.5 - 5.0 g/dL   AST 37 15 - 41 U/L   ALT 33 0 - 44 U/L   Alkaline Phosphatase 68 38 - 126 U/L   Total Bilirubin 0.7 0.3 - 1.2 mg/dL   GFR, Estimated >60 >60 mL/min    Comment: (NOTE) Calculated using the CKD-EPI Creatinine Equation (2021)    Anion gap 8 5 - 15    Comment: Performed at [facility], [place], [place] [address]  Troponin I (High Sensitivity)     Status: None   Collection Time: 01/28/21  2:18 AM  Result Value Ref Range   Troponin I (High Sensitivity) 6 <18 ng/L    Comment: (NOTE) Elevated high sensitivity troponin I (hsTnI) values and significant  changes across serial measurements may suggest ACS but many other  chronic and acute conditions are known to elevate hsTnI results.  Refer to the "Links" section for chest pain algorithms and additional   guidance. Performed at [facility], [address] [place], [place] [address]   Ethanol     Status: Abnormal   Collection Time: 01/28/21  2:18 AM  Result Value Ref Range   Alcohol, Ethyl (B) 334 (HH) <10 mg/dL  Comment: CRITICAL RESULT CALLED TO, READ BACK BY AND VERIFIED WITH ASHLEY WINEMAN'@0248'$  01/28/21 RH (NOTE) Lowest detectable limit for serum alcohol is 10 mg/dL.  For medical purposes only. Performed at [facility], [place], [place] [address]   Acetaminophen level     Status: Abnormal   Collection Time: 01/28/21  2:18 AM  Result Value Ref Range   Acetaminophen (Tylenol), Serum <10 (L) 10 - 30 ug/mL    Comment: (NOTE) Therapeutic concentrations vary significantly. A range of 10-30 ug/mL  may be an effective concentration for many patients. However, some  are best treated at concentrations outside of this range. Acetaminophen concentrations >150 ug/mL at 4 hours after ingestion  and >50 ug/mL at 12 hours after ingestion are often associated with  toxic reactions.  Performed at [facility], [place], [place] [id number]   Salicylate level     Status: Abnormal   Collection Time: 01/28/21  2:18 AM  Result Value Ref Range   Salicylate Lvl Q000111Q (L) 7.0 - 30.0 mg/dL    Comment: Performed at [facility], [place], [place] [address]  Troponin I (High Sensitivity)     Status: None   Collection Time: 01/28/21  5:20 AM  Result Value Ref Range   Troponin I (High Sensitivity) 7 <18 ng/L    Comment: (NOTE) Elevated high sensitivity troponin I (hsTnI) values and significant  changes across serial measurements may suggest ACS but many other  chronic and acute conditions are known to elevate hsTnI results.  Refer to the "Links" section for chest pain algorithms and additional  guidance. Performed at [facility], [address], [place], [place] [address]     Current  Facility-Administered Medications  Medication Dose Route Frequency Provider Last Rate Last Admin   albuterol (PROVENTIL) (2.5 MG/3ML) 0.083% nebulizer solution 3-6 mL  3-6 mL Inhalation Q4H PRN Ward, Kristen N, DO       albuterol (VENTOLIN HFA) 108 (90 Base) MCG/ACT inhaler 2 puff  2 puff Inhalation NOW Tache Bobst, Madie Reno, MD       citalopram (CELEXA) tablet 20 mg  20 mg Oral Daily Ward, Kristen N, DO       folic acid (FOLVITE) tablet 1 mg  1 mg Oral Daily Ward, Kristen N, DO       LORazepam (ATIVAN) injection 0-4 mg  0-4 mg Intravenous Q6H Ward, Kristen N, DO       Or   LORazepam (ATIVAN) tablet 0-4 mg  0-4 mg Oral Q6H Ward, Kristen N, DO       [START ON 01/30/2021] LORazepam (ATIVAN) injection 0-4 mg  0-4 mg Intravenous Q12H Ward, Kristen N, DO       Or   [START ON 01/30/2021] LORazepam (ATIVAN) tablet 0-4 mg  0-4 mg Oral Q12H Ward, Kristen N, DO       multivitamin with minerals tablet 1 tablet  1 tablet Oral Daily Ward, Kristen N, DO       pantoprazole (PROTONIX) EC tablet 40 mg  40 mg Oral Daily Ward, Kristen N, DO       thiamine tablet 100 mg  100 mg Oral Daily Ward, Kristen N, DO       Or   thiamine (B-1) injection 100 mg  100 mg Intravenous Daily Ward, Kristen N, DO       traZODone (DESYREL) tablet 150 mg  150 mg Oral QHS Ward, Delice Bison, DO  Current Outpatient Medications  Medication Sig Dispense Refill   albuterol (VENTOLIN HFA) 108 (90 Base) MCG/ACT inhaler Inhale 2 puffs into the lungs every 6 (six) hours as needed for wheezing or shortness of breath. 18 g 1   citalopram (CELEXA) 20 MG tablet Take 1 tablet (20 mg total) by mouth daily. 30 tablet 2   folic acid (FOLVITE) 1 MG tablet Take 1 tablet (1 mg total) by mouth daily. 30 tablet 3   multivitamin (ONE-A-DAY MEN'S) TABS tablet Take 1 tablet by mouth daily. 30 tablet 3   pantoprazole (PROTONIX) 40 MG tablet Take 1 tablet (40 mg total) by mouth daily. 30 tablet 2   thiamine 100 MG tablet Take 1 tablet (100 mg total) by mouth  daily. 30 tablet 3   traZODone (DESYREL) 150 MG tablet Take 1 tablet (150 mg total) by mouth at bedtime. 30 tablet 2    Musculoskeletal: Strength & Muscle Tone: within normal limits Gait & Station: normal Patient leans: N/A            Psychiatric Specialty Exam:  Presentation  General Appearance:  No data recorded Eye Contact: No data recorded Speech: No data recorded Speech Volume: No data recorded Handedness: No data recorded  Mood and Affect  Mood: No data recorded Affect: No data recorded  Thought Process  Thought Processes: No data recorded Descriptions of Associations:No data recorded Orientation:No data recorded Thought Content:No data recorded History of Schizophrenia/Schizoaffective disorder:No data recorded Duration of Psychotic Symptoms:No data recorded Hallucinations:No data recorded Ideas of Reference:No data recorded Suicidal Thoughts:No data recorded Homicidal Thoughts:No data recorded  Sensorium  Memory: No data recorded Judgment: No data recorded Insight: No data recorded  Executive Functions  Concentration: No data recorded Attention Span: No data recorded Recall: No data recorded Fund of Knowledge: No data recorded Language: No data recorded  Psychomotor Activity  Psychomotor Activity: No data recorded  Assets  Assets: No data recorded  Sleep  Sleep: No data recorded  Physical Exam: Physical Exam Vitals and nursing note reviewed.  Constitutional:      Appearance: Normal appearance.  HENT:     Head: Normocephalic and atraumatic.     Mouth/Throat:     Pharynx: Oropharynx is clear.  Eyes:     Pupils: Pupils are equal, round, and reactive to light.  Cardiovascular:     Rate and Rhythm: Normal rate and regular rhythm.  Pulmonary:     Effort: Pulmonary effort is normal.     Breath sounds: Normal breath sounds.  Abdominal:     General: Abdomen is flat.     Palpations: Abdomen is soft.  Musculoskeletal:         General: Normal range of motion.  Skin:    General: Skin is warm and dry.  Neurological:     General: No focal deficit present.     Mental Status: He is alert. Mental status is at baseline.  Psychiatric:        Attention and Perception: Attention normal.        Mood and Affect: Mood normal. Affect is blunt.        Speech: Speech normal.        Behavior: Behavior is withdrawn.        Thought Content: Thought content normal. Thought content is not paranoid or delusional. Thought content does not include suicidal ideation.        Cognition and Memory: Memory is impaired.        Judgment: Judgment is impulsive.  Review of Systems  Respiratory:  Positive for shortness of breath.   Psychiatric/Behavioral:  Positive for memory loss and substance abuse. Negative for depression, hallucinations and suicidal ideas.   Blood pressure 115/89, pulse 92, temperature 98.4 F (36.9 C), temperature source Oral, resp. rate 18, height '5\' 11"'$  (1.803 m), weight 124.7 kg, SpO2 95 %. Body mass index is 38.35 kg/m.  Treatment Plan Summary: Plan 60 year old man came to the hospital intoxicated but without any evidence of having tried to harm himself.  On interview today he is behaving calmly and soberly.  Says that his memories of last night are a little sketchy but that he thinks that he was mainly concerned about chest pain.  Currently denying any chest pain.  Patient as noted above shows insight into the need to change his alcohol problem.  He denies suicidal or homicidal thoughts.  Patient was counseled about the availability of treatment for PTSD anxiety and alcohol abuse and encouraged to rethink his resistance to going to the [place] or at least to get a primary care doctor.  At his request I am giving him a prescription for his albuterol inhaler which she has been out of at home but needs.  Case reviewed with emergency room physician.  Disposition: No evidence of imminent risk to self or others at present.    Patient does not meet criteria for psychiatric inpatient admission. Supportive therapy provided about ongoing stressors. Discussed crisis plan, support from social network, calling 911, coming to the Emergency Department, and calling Suicide Hotline.  Alethia Berthold, MD 01/28/2021 11:17 AM

## 2021-01-28 NOTE — ED Provider Notes (Signed)
Patient now awake, alert, clinically sober. Denies any major complaints. Psychiatry was rounding and saw pt as well, do not feel he needs inpt admission. D/c with outpt follow-up. Requested refill on albuterol, but o/w without medical complaints. Satting well on RA with normal work of breathing.   Duffy Bruce, MD 01/28/21 [id number]

## 2021-01-28 NOTE — ED Triage Notes (Signed)
Pt to ED via EMS from home, pt arrives intoxicated, pt states he has been drinking bud light all night. Pt is verbally abusive on arrival and unsteady on his feet. Pt states he is having some chest pain.

## 2021-02-06 ENCOUNTER — Emergency Department

## 2021-02-06 ENCOUNTER — Other Ambulatory Visit: Payer: Self-pay

## 2021-02-06 ENCOUNTER — Emergency Department
Admission: EM | Admit: 2021-02-06 | Discharge: 2021-02-06 | Disposition: A | Discharge status: Home / Self Care | Source: Home / Self Care | Attending: Emergency Medicine | Admitting: Emergency Medicine

## 2021-02-06 DIAGNOSIS — K219 Gastro-esophageal reflux disease without esophagitis: Secondary | ICD-10-CM | POA: Diagnosis not present

## 2021-02-06 DIAGNOSIS — Z23 Encounter for immunization: Secondary | ICD-10-CM | POA: Insufficient documentation

## 2021-02-06 DIAGNOSIS — I10 Essential (primary) hypertension: Secondary | ICD-10-CM | POA: Insufficient documentation

## 2021-02-06 DIAGNOSIS — S91112A Laceration without foreign body of left great toe without damage to nail, initial encounter: Secondary | ICD-10-CM | POA: Insufficient documentation

## 2021-02-06 DIAGNOSIS — F17210 Nicotine dependence, cigarettes, uncomplicated: Secondary | ICD-10-CM | POA: Insufficient documentation

## 2021-02-06 DIAGNOSIS — Y9301 Activity, walking, marching and hiking: Secondary | ICD-10-CM | POA: Insufficient documentation

## 2021-02-06 DIAGNOSIS — F1020 Alcohol dependence, uncomplicated: Secondary | ICD-10-CM | POA: Diagnosis not present

## 2021-02-06 DIAGNOSIS — Z79899 Other long term (current) drug therapy: Secondary | ICD-10-CM | POA: Diagnosis not present

## 2021-02-06 DIAGNOSIS — Z20822 Contact with and (suspected) exposure to covid-19: Secondary | ICD-10-CM | POA: Diagnosis not present

## 2021-02-06 DIAGNOSIS — W268XXA Contact with other sharp object(s), not elsewhere classified, initial encounter: Secondary | ICD-10-CM | POA: Insufficient documentation

## 2021-02-06 DIAGNOSIS — L03032 Cellulitis of left toe: Secondary | ICD-10-CM | POA: Diagnosis not present

## 2021-02-06 DIAGNOSIS — J449 Chronic obstructive pulmonary disease, unspecified: Secondary | ICD-10-CM | POA: Insufficient documentation

## 2021-02-06 DIAGNOSIS — Y92007 Garden or yard of unspecified non-institutional (private) residence as the place of occurrence of the external cause: Secondary | ICD-10-CM | POA: Diagnosis not present

## 2021-02-06 MED ORDER — CEPHALEXIN 500 MG PO CAPS: 500.0000 mg | ORAL_CAPSULE | Freq: Three times a day (TID) | ORAL | 0 refills | Status: DC

## 2021-02-06 MED ORDER — TETANUS-DIPHTH-ACELL PERTUSSIS 5-2.5-18.5 LF-MCG/0.5 IM SUSY
PREFILLED_SYRINGE | INTRAMUSCULAR | Status: AC
  Filled 2021-02-06: qty 0.5

## 2021-02-06 MED ORDER — SULFAMETHOXAZOLE-TRIMETHOPRIM 800-160 MG PO TABS
1.0000 | ORAL_TABLET | Freq: Once | ORAL | Status: AC
  Administered 2021-02-06: 1 via ORAL
  Filled 2021-02-06: qty 1

## 2021-02-06 MED ORDER — BUPIVACAINE HCL (PF) 0.5 % IJ SOLN: 50.0000 mL | Freq: Once | INTRAMUSCULAR | Status: DC

## 2021-02-06 MED ORDER — SULFAMETHOXAZOLE-TRIMETHOPRIM 800-160 MG PO TABS: 1.0000 | ORAL_TABLET | Freq: Two times a day (BID) | ORAL | 0 refills | Status: DC

## 2021-02-06 MED ORDER — TETANUS-DIPHTH-ACELL PERTUSSIS 5-2.5-18.5 LF-MCG/0.5 IM SUSY
0.5000 mL | PREFILLED_SYRINGE | Freq: Once | INTRAMUSCULAR | Status: AC
  Administered 2021-02-06: 0.5 mL via INTRAMUSCULAR

## 2021-02-06 MED ORDER — CEPHALEXIN 500 MG PO CAPS
500.0000 mg | ORAL_CAPSULE | Freq: Once | ORAL | Status: AC
  Administered 2021-02-06: 500 mg via ORAL
  Filled 2021-02-06: qty 1

## 2021-02-06 NOTE — Discharge Instructions (Addendum)
Wear the hard-sole shoe for 10-14 days until healed  Take the antibiotics to prevent infection  Keep the wound clean and dry  Return in 10-14 days for suture removal

## 2021-02-06 NOTE — ED Provider Notes (Signed)
[facility] Emergency Department Provider Note  ____________________________________________   Event Date/Time   First MD Initiated Contact with Patient 02/06/21 [phone]     (approximate)  I have reviewed the triage vital signs and the nursing notes.   HISTORY  Chief Complaint Laceration    HPI Jesse Obrien is a 60 y.o. male with past medical history as below including alcohol abuse here with laceration to the left foot.  The patient reportedly was walking outside in the grass when he sliced his toe on what he says was a piece of a toilet that he had broken in his yard months ago.  Reports immediate onset of sharp pain.  He has been bleeding since then.  He has been ambulating on it, however.  He does not believe he had any piece break off.  He is unsure of his last tetanus shot.  He does admit to alcohol use throughout the night tonight.  No other complaints.    Past Medical History:  Diagnosis Date   Alcohol abuse    DJD (degenerative joint disease)    Hypertension 01/02/2015    Patient Active Problem List   Diagnosis Date Noted   COPD (chronic obstructive pulmonary disease) ([place]) 01/28/2021   Alcohol intoxication ([place]) 12/20/2020   Tobacco use disorder 10/28/2015   GERD (gastroesophageal reflux disease) 10/28/2015   Alcohol dependence with uncomplicated withdrawal ([place]) 03/20/2015   DDD (degenerative disc disease), lumbar 03/20/2015   Osteoarthrosis, unspecified whether generalized or localized, involving lower leg 03/20/2015   Spinal stenosis of lumbar region [id number]   Self inflicted neck laceration 01/02/2015   Hypertension 01/02/2015    No past surgical history on file.  Prior to Admission medications   Medication Sig Start Date End Date Taking? Authorizing Provider  cephALEXin (KEFLEX) 500 MG capsule Take 1 capsule (500 mg total) by mouth 3 (three) times daily for 7 days. 02/06/21 02/13/21 Yes Duffy Bruce, MD   sulfamethoxazole-trimethoprim (BACTRIM DS) 800-160 MG tablet Take 1 tablet by mouth 2 (two) times daily for 7 days. 02/06/21 02/13/21 Yes Duffy Bruce, MD  albuterol (VENTOLIN HFA) 108 (90 Base) MCG/ACT inhaler Inhale 2 puffs into the lungs every 6 (six) hours as needed for wheezing or shortness of breath. 01/28/21   Clapacs, Madie Reno, MD  citalopram (CELEXA) 20 MG tablet Take 1 tablet (20 mg total) by mouth daily. 06/24/19 06/23/20  Hinda Kehr, MD  folic acid (FOLVITE) 1 MG tablet Take 1 tablet (1 mg total) by mouth daily. 11/22/20 11/22/21  Ward, Delice Bison, DO  multivitamin (ONE-A-DAY MEN'S) TABS tablet Take 1 tablet by mouth daily. 11/22/20   Ward, Delice Bison, DO  pantoprazole (PROTONIX) 40 MG tablet Take 1 tablet (40 mg total) by mouth daily. 06/24/19   Hinda Kehr, MD  thiamine 100 MG tablet Take 1 tablet (100 mg total) by mouth daily. 11/22/20   Ward, Delice Bison, DO  traZODone (DESYREL) 150 MG tablet Take 1 tablet (150 mg total) by mouth at bedtime. 06/24/19   Hinda Kehr, MD    Allergies Patient has no known allergies.  No family history on file.  Social History Social History   Tobacco Use   Smoking status: Every Day    Years: 15.00    Types: Cigarettes   Smokeless tobacco: Never  Substance Use Topics   Alcohol use: Yes   Drug use: No    Review of Systems  Review of Systems  Constitutional:  Negative for chills and fever.  HENT:  Negative for sore throat.   Respiratory:  Negative for shortness of breath.   Cardiovascular:  Negative for chest pain.  Gastrointestinal:  Negative for abdominal pain.  Genitourinary:  Negative for flank pain.  Musculoskeletal:  Negative for neck pain.  Skin:  Positive for wound. Negative for rash.  Allergic/Immunologic: Negative for immunocompromised state.  Neurological:  Negative for weakness and numbness.  Hematological:  Does not bruise/bleed easily.  All other systems reviewed and are negative.    ____________________________________________  PHYSICAL EXAM:      VITAL SIGNS: ED Triage Vitals  Enc Vitals Group     BP 02/06/21 0336 137/85     Pulse Rate 02/06/21 0336 88     Resp 02/06/21 0336 16     Temp 02/06/21 0336 98.4 F (36.9 C)     Temp src --      SpO2 02/06/21 0336 99 %     Weight 02/06/21 0337 210 lb (95.3 kg)     Height 02/06/21 0337 '5\' 10"'$  (1.778 m)     Head Circumference --      Peak Flow --      Pain Score 02/06/21 0337 0     Pain Loc --      Pain Edu? --      Excl. in [place]? --      Physical Exam Vitals and nursing note reviewed.  Constitutional:      General: He is not in acute distress.    Appearance: He is well-developed.  HENT:     Head: Normocephalic and atraumatic.  Eyes:     Conjunctiva/sclera: Conjunctivae normal.  Cardiovascular:     Rate and Rhythm: Normal rate and regular rhythm.     Heart sounds: Normal heart sounds.  Pulmonary:     Effort: Pulmonary effort is normal. No respiratory distress.     Breath sounds: No wheezing.  Abdominal:     General: There is no distension.  Musculoskeletal:     Cervical back: Neck supple.  Skin:    General: Skin is warm.     Capillary Refill: Capillary refill takes less than 2 seconds.     Findings: No rash.  Neurological:     Mental Status: He is alert and oriented to person, place, and time.     Motor: No abnormal muscle tone.     LOWER EXTREMITY EXAM: LEFT  INSPECTION & PALPATION: Approx 5.5 cm laceration to base of first toe, V-shaped, with avulsion. Deep tissue involved but no apparent underlying muscle or tendon injury/exposure. No pulsatile bleeding.  SENSORY: sensation is intact to light touch in:  Superficial peroneal nerve distribution (over dorsum of foot) Deep peroneal nerve distribution (over first dorsal web space) Sural nerve distribution (over lateral aspect 5th metatarsal) Saphenous nerve distribution (over medial instep)  MOTOR:  + Motor EHL (great toe dorsiflexion) +  FHL (great toe plantar flexion)  + TA (ankle dorsiflexion)  + GSC (ankle plantar flexion)  VASCULAR: 2+ dorsalis pedis and posterior tibialis pulses Capillary refill < 2 sec, toes warm and well-perfused  COMPARTMENTS: Soft, warm, well-perfused No pain with passive extension No parethesias   ____________________________________________   LABS (all labs ordered are listed, but only abnormal results are displayed)  Labs Reviewed - No data to display  ____________________________________________  EKG:  ________________________________________  RADIOLOGY All imaging, including plain films, CT scans, and ultrasounds, independently reviewed by me, and interpretations confirmed via formal radiology reads.  ED MD interpretation:   X-ray foot left: No foreign body  Official radiology report(s): DG Foot Complete Left  Result Date: 02/06/2021 CLINICAL DATA:  Laceration with debris in wound. EXAM: LEFT FOOT - COMPLETE 3+ VIEW COMPARISON:  None. FINDINGS: Plantar forefoot laceration with bandage. No opaque foreign body or fracture. No subluxation. IMPRESSION: No opaque foreign body or fracture. Electronically Signed   By: Monte Fantasia M.D.   On: 02/06/2021 04:22    ____________________________________________  PROCEDURES   Procedure(s) performed (including Critical Care):  Marland KitchenMarland KitchenLaceration Repair  Date/Time: 02/06/2021 5:06 AM Performed by: Duffy Bruce, MD Authorized by: Duffy Bruce, MD   Consent:    Consent obtained:  Verbal   Consent given by:  Patient   Risks discussed:  Infection, need for additional repair, pain, tendon damage, retained foreign body, vascular damage, poor cosmetic result, poor wound healing and nerve damage   Alternatives discussed:  Referral and delayed treatment Anesthesia:    Anesthesia method:  Local infiltration   Local anesthetic:  Bupivacaine 0.5% w/o epi Laceration details:    Location: Plantar aspect of left first toe.   Length (cm):   5.5 Pre-procedure details:    Preparation:  Patient was prepped and draped in usual sterile fashion and imaging obtained to evaluate for foreign bodies Exploration:    Hemostasis achieved with:  Direct pressure   Wound exploration: wound explored through full range of motion     Wound extent: no fascia violation noted, no muscle damage noted, no tendon damage noted, no underlying fracture noted and no vascular damage noted     Contaminated: no   Treatment:    Area cleansed with:  Betadine and chlorhexidine   Amount of cleaning:  Extensive   Irrigation solution:  Sterile water   Irrigation volume:  500   Irrigation method:  Pressure wash   Debridement:  Minimal   Undermining:  Minimal   Scar revision: no   Skin repair:    Repair method:  Sutures   Suture size:  4-0   Suture material:  Prolene   Suture technique:  Simple interrupted   Number of sutures:  14 Approximation:    Approximation:  Close Repair type:    Repair type:  Simple Post-procedure details:    Dressing:  Antibiotic ointment, non-adherent dressing and sterile dressing   Procedure completion:  Tolerated well, no immediate complications  ____________________________________________  INITIAL IMPRESSION / MDM / ASSESSMENT AND PLAN / ED COURSE  As part of my medical decision making, I reviewed the following data within the [place] notes reviewed and incorporated, Old chart reviewed, Notes from prior ED visits, and [place] Controlled Substance Database       *Jesse Obrien was evaluated in Emergency Department on 02/06/2021 for the symptoms described in the history of present illness. He was evaluated in the context of the global COVID-19 pandemic, which necessitated consideration that the patient might be at risk for infection with the SARS-CoV-2 virus that causes COVID-19. Institutional protocols and algorithms that pertain to the evaluation of patients at risk for COVID-19 are in a state of  rapid change based on information released by regulatory bodies including the CDC and federal and state organizations. These policies and algorithms were followed during the patient's care in the ED.  Some ED evaluations and interventions may be delayed as a result of limited staffing during the pandemic.*     Medical Decision Making: 60 year old male here with laceration to the plantar aspect of his left great toe.  Laceration was thoroughly cleansed and repaired as  above.  Plain film showed no radiopaque foreign body or fracture.  Patient certainly high risk for infection given his mechanism of injury.  I discussed this with him at length, and will place him on empiric antibiotics.  Postop shoe given.  I discussed the importance of staying off the toe as much as possible as well as keeping the area clean and dry.  Return precautions given.  ____________________________________________  FINAL CLINICAL IMPRESSION(S) / ED DIAGNOSES  Final diagnoses:  Laceration of left great toe without foreign body present or damage to nail, initial encounter     MEDICATIONS GIVEN DURING THIS VISIT:  Medications  bupivacaine (MARCAINE) 0.5 % injection 50 mL (50 mLs Infiltration Incomplete 02/06/21 0501)  Tdap (BOOSTRIX) injection 0.5 mL (0.5 mLs Intramuscular Given 02/06/21 0355)  cephALEXin (KEFLEX) capsule 500 mg (500 mg Oral Given 02/06/21 0539)  sulfamethoxazole-trimethoprim (BACTRIM DS) 800-160 MG per tablet 1 tablet (1 tablet Oral Given 02/06/21 0540)     ED Discharge Orders          Ordered    cephALEXin (KEFLEX) 500 MG capsule  3 times daily        02/06/21 0512    sulfamethoxazole-trimethoprim (BACTRIM DS) 800-160 MG tablet  2 times daily        02/06/21 [id number]             Note:  This document was prepared using Dragon voice recognition software and may include unintentional dictation errors.   Duffy Bruce, MD 02/06/21 [phone]

## 2021-02-06 NOTE — ED Triage Notes (Signed)
Pt states lacerated foot on broken toilet in his yard. Pt is intoxicated. Pt with laceration noted to left foot.

## 2021-02-08 ENCOUNTER — Emergency Department

## 2021-02-08 ENCOUNTER — Other Ambulatory Visit: Payer: Self-pay

## 2021-02-08 ENCOUNTER — Inpatient Hospital Stay
Admission: EM | Admit: 2021-02-08 | Discharge: 2021-02-09 | DRG: 603 | Disposition: A | Discharge status: Home / Self Care | Attending: Hospitalist | Admitting: Hospitalist

## 2021-02-08 DIAGNOSIS — R509 Fever, unspecified: Secondary | ICD-10-CM

## 2021-02-08 DIAGNOSIS — W268XXA Contact with other sharp object(s), not elsewhere classified, initial encounter: Secondary | ICD-10-CM | POA: Diagnosis not present

## 2021-02-08 DIAGNOSIS — S91112A Laceration without foreign body of left great toe without damage to nail, initial encounter: Secondary | ICD-10-CM | POA: Diagnosis present

## 2021-02-08 DIAGNOSIS — Z79899 Other long term (current) drug therapy: Secondary | ICD-10-CM

## 2021-02-08 DIAGNOSIS — Y92007 Garden or yard of unspecified non-institutional (private) residence as the place of occurrence of the external cause: Secondary | ICD-10-CM

## 2021-02-08 DIAGNOSIS — L089 Local infection of the skin and subcutaneous tissue, unspecified: Secondary | ICD-10-CM

## 2021-02-08 DIAGNOSIS — L03116 Cellulitis of left lower limb: Secondary | ICD-10-CM

## 2021-02-08 DIAGNOSIS — Z23 Encounter for immunization: Secondary | ICD-10-CM | POA: Diagnosis not present

## 2021-02-08 DIAGNOSIS — F1020 Alcohol dependence, uncomplicated: Secondary | ICD-10-CM | POA: Diagnosis present

## 2021-02-08 DIAGNOSIS — J449 Chronic obstructive pulmonary disease, unspecified: Secondary | ICD-10-CM | POA: Diagnosis present

## 2021-02-08 DIAGNOSIS — Z20822 Contact with and (suspected) exposure to covid-19: Secondary | ICD-10-CM | POA: Diagnosis present

## 2021-02-08 DIAGNOSIS — F17210 Nicotine dependence, cigarettes, uncomplicated: Secondary | ICD-10-CM | POA: Diagnosis present

## 2021-02-08 DIAGNOSIS — L03032 Cellulitis of left toe: Secondary | ICD-10-CM | POA: Diagnosis present

## 2021-02-08 DIAGNOSIS — I10 Essential (primary) hypertension: Secondary | ICD-10-CM | POA: Diagnosis present

## 2021-02-08 DIAGNOSIS — K219 Gastro-esophageal reflux disease without esophagitis: Secondary | ICD-10-CM | POA: Diagnosis present

## 2021-02-08 DIAGNOSIS — F17200 Nicotine dependence, unspecified, uncomplicated: Secondary | ICD-10-CM | POA: Diagnosis present

## 2021-02-08 LAB — URINALYSIS, COMPLETE (UACMP) WITH MICROSCOPIC
Bacteria, UA: NONE SEEN
Bilirubin Urine: NEGATIVE
Glucose, UA: NEGATIVE mg/dL
Hgb urine dipstick: NEGATIVE
Ketones, ur: NEGATIVE mg/dL
Leukocytes,Ua: NEGATIVE
Nitrite: NEGATIVE
Protein, ur: NEGATIVE mg/dL
Specific Gravity, Urine: 1.013 (ref 1.005–1.030)
Squamous Epithelial / HPF: NONE SEEN (ref 0–5)
pH: 6 (ref 5.0–8.0)

## 2021-02-08 LAB — COMPREHENSIVE METABOLIC PANEL
ALT: 29 U/L (ref 0–44)
AST: 31 U/L (ref 15–41)
Albumin: 4.1 g/dL (ref 3.5–5.0)
Alkaline Phosphatase: 73 U/L (ref 38–126)
Anion gap: 7 (ref 5–15)
BUN: 8 mg/dL (ref 6–20)
CO2: 22 mmol/L (ref 22–32)
Calcium: 8.6 mg/dL — ABNORMAL LOW (ref 8.9–10.3)
Chloride: 102 mmol/L (ref 98–111)
Creatinine, Ser: 1.19 mg/dL (ref 0.61–1.24)
GFR, Estimated: >60 mL/min (ref >60)
Glucose, Bld: 97 mg/dL (ref 70–99)
Potassium: 3.7 mmol/L (ref 3.5–5.1)
Sodium: 131 mmol/L — ABNORMAL LOW (ref 135–145)
Total Bilirubin: 0.6 mg/dL (ref 0.3–1.2)
Total Protein: 7.3 g/dL (ref 6.5–8.1)

## 2021-02-08 LAB — PHOSPHORUS: Phosphorus: 2.3 mg/dL — ABNORMAL LOW (ref 2.5–4.6)

## 2021-02-08 LAB — CBC WITH DIFFERENTIAL/PLATELET
Abs Immature Granulocytes: 0.08 10*3/uL — ABNORMAL HIGH (ref 0.00–0.07)
Basophils Absolute: 0 10*3/uL (ref 0.0–0.1)
Basophils Relative: 0 %
Eosinophils Absolute: 0.1 10*3/uL (ref 0.0–0.5)
Eosinophils Relative: 2 %
HCT: 36.3 % — ABNORMAL LOW (ref 39.0–52.0)
Hemoglobin: 12.4 g/dL — ABNORMAL LOW (ref 13.0–17.0)
Immature Granulocytes: 1 %
Lymphocytes Relative: 10 %
Lymphs Abs: 0.7 10*3/uL (ref 0.7–4.0)
MCH: 34 pg (ref 26.0–34.0)
MCHC: 34.2 g/dL (ref 30.0–36.0)
MCV: 99.5 fL (ref 80.0–100.0)
Monocytes Absolute: 0.8 10*3/uL (ref 0.1–1.0)
Monocytes Relative: 12 %
Neutro Abs: 5.3 10*3/uL (ref 1.7–7.7)
Neutrophils Relative %: 75 %
Platelets: 140 10*3/uL — ABNORMAL LOW (ref 150–400)
RBC: 3.65 MIL/uL — ABNORMAL LOW (ref 4.22–5.81)
RDW: 12.8 % (ref 11.5–15.5)
WBC: 7 10*3/uL (ref 4.0–10.5)
nRBC: 0 % (ref 0.0–0.2)

## 2021-02-08 LAB — LACTIC ACID, PLASMA
Lactic Acid, Venous: 1.6 mmol/L (ref 0.5–1.9)
Lactic Acid, Venous: 1.6 mmol/L (ref 0.5–1.9)

## 2021-02-08 LAB — RESP PANEL BY RT-PCR (FLU A&B, COVID) ARPGX2
Influenza A by PCR: NEGATIVE
Influenza B by PCR: NEGATIVE
SARS Coronavirus 2 by RT PCR: NEGATIVE

## 2021-02-08 LAB — MAGNESIUM: Magnesium: 2.1 mg/dL (ref 1.7–2.4)

## 2021-02-08 MED ORDER — ALBUTEROL SULFATE (2.5 MG/3ML) 0.083% IN NEBU: 2.5000 mg | INHALATION_SOLUTION | Freq: Four times a day (QID) | RESPIRATORY_TRACT | Status: DC | PRN

## 2021-02-08 MED ORDER — FOLIC ACID 1 MG PO TABS: 1.0000 mg | ORAL_TABLET | Freq: Every day | ORAL | Status: DC

## 2021-02-08 MED ORDER — ADULT MULTIVITAMIN W/MINERALS CH
1.0000 | ORAL_TABLET | Freq: Every day | ORAL | Status: DC
  Administered 2021-02-08 – 2021-02-09 (×2): 1 via ORAL
  Filled 2021-02-08 (×2): qty 1

## 2021-02-08 MED ORDER — ACETAMINOPHEN 650 MG RE SUPP: 650.0000 mg | Freq: Four times a day (QID) | RECTAL | Status: DC | PRN

## 2021-02-08 MED ORDER — ONDANSETRON HCL 4 MG PO TABS: 4.0000 mg | ORAL_TABLET | Freq: Four times a day (QID) | ORAL | Status: DC | PRN

## 2021-02-08 MED ORDER — ONDANSETRON HCL 4 MG/2ML IJ SOLN: 4.0000 mg | Freq: Four times a day (QID) | INTRAMUSCULAR | Status: DC | PRN

## 2021-02-08 MED ORDER — VANCOMYCIN HCL IN DEXTROSE 1-5 GM/200ML-% IV SOLN
1000.0000 mg | Freq: Once | INTRAVENOUS | Status: AC
  Administered 2021-02-08: 1000 mg via INTRAVENOUS
  Filled 2021-02-08: qty 200

## 2021-02-08 MED ORDER — THIAMINE HCL 100 MG PO TABS: 100.0000 mg | ORAL_TABLET | Freq: Every day | ORAL | Status: DC

## 2021-02-08 MED ORDER — ALBUTEROL SULFATE HFA 108 (90 BASE) MCG/ACT IN AERS: 2.0000 | INHALATION_SPRAY | Freq: Four times a day (QID) | RESPIRATORY_TRACT | Status: DC | PRN

## 2021-02-08 MED ORDER — ENOXAPARIN SODIUM 40 MG/0.4ML IJ SOSY
40.0000 mg | PREFILLED_SYRINGE | INTRAMUSCULAR | Status: DC
  Administered 2021-02-08: 40 mg via SUBCUTANEOUS
  Filled 2021-02-08: qty 0.4

## 2021-02-08 MED ORDER — THIAMINE HCL 100 MG/ML IJ SOLN: 100.0000 mg | Freq: Every day | INTRAMUSCULAR | Status: DC

## 2021-02-08 MED ORDER — NICOTINE 21 MG/24HR TD PT24
21.0000 mg | MEDICATED_PATCH | Freq: Every day | TRANSDERMAL | Status: DC
  Filled 2021-02-08: qty 1

## 2021-02-08 MED ORDER — FOLIC ACID 1 MG PO TABS
1.0000 mg | ORAL_TABLET | Freq: Every day | ORAL | Status: DC
  Administered 2021-02-08 – 2021-02-09 (×2): 1 mg via ORAL
  Filled 2021-02-08 (×2): qty 1

## 2021-02-08 MED ORDER — SODIUM CHLORIDE 0.9 % IV SOLN
1.0000 g | INTRAVENOUS | Status: DC
  Filled 2021-02-08: qty 10

## 2021-02-08 MED ORDER — ACETAMINOPHEN 325 MG PO TABS
650.0000 mg | ORAL_TABLET | Freq: Four times a day (QID) | ORAL | Status: DC | PRN
  Administered 2021-02-09 (×2): 650 mg via ORAL
  Filled 2021-02-08 (×2): qty 2

## 2021-02-08 MED ORDER — THIAMINE HCL 100 MG PO TABS
100.0000 mg | ORAL_TABLET | Freq: Every day | ORAL | Status: DC
  Administered 2021-02-08 – 2021-02-09 (×2): 100 mg via ORAL
  Filled 2021-02-08 (×2): qty 1

## 2021-02-08 MED ORDER — LORAZEPAM 2 MG/ML IJ SOLN: 0.0000 mg | Freq: Two times a day (BID) | INTRAMUSCULAR | Status: DC

## 2021-02-08 MED ORDER — ACETAMINOPHEN 500 MG PO TABS
1000.0000 mg | ORAL_TABLET | Freq: Once | ORAL | Status: AC
  Administered 2021-02-08: 1000 mg via ORAL
  Filled 2021-02-08: qty 2

## 2021-02-08 MED ORDER — ADULT MULTIVITAMIN W/MINERALS CH: 1.0000 | ORAL_TABLET | Freq: Every day | ORAL | Status: DC

## 2021-02-08 MED ORDER — LORAZEPAM 2 MG/ML IJ SOLN: 0.0000 mg | Freq: Four times a day (QID) | INTRAMUSCULAR | Status: DC

## 2021-02-08 MED ORDER — LORAZEPAM 2 MG/ML IJ SOLN: 1.0000 mg | INTRAMUSCULAR | Status: DC | PRN

## 2021-02-08 MED ORDER — SODIUM CHLORIDE 0.9 % IV SOLN
1.0000 g | Freq: Once | INTRAVENOUS | Status: AC
  Administered 2021-02-08: 1 g via INTRAVENOUS
  Filled 2021-02-08: qty 10

## 2021-02-08 MED ORDER — LACTATED RINGERS IV BOLUS
1000.0000 mL | Freq: Once | INTRAVENOUS | Status: AC
  Administered 2021-02-08: 1000 mL via INTRAVENOUS

## 2021-02-08 MED ORDER — LORAZEPAM 1 MG PO TABS: 1.0000 mg | ORAL_TABLET | ORAL | Status: DC | PRN

## 2021-02-08 NOTE — H&P (Signed)
History and Physical    Jesse Obrien [id number] DOB: [birth year] DOA: 02/08/2021  PCP: Pcp, No   Patient coming from: home  I have personally briefly reviewed patient's old medical records in [facility]  Chief Complaint: pain and swelling foot, fever, cough  HPI: Jesse Obrien is a 60 y.o. male with medical history significant for COPD, HTN, tobacco and alcohol use, who had laceration repair to the left great toe on 8/5 and started on Keflex and Bactrim, who returns to the ED with a complaint of fever of 102 associated with chills that started on awakening earlier in the day he also noted more pain as well as swelling and redness of the foot stemming from the site of the laceration.  He states his wife was recently diagnosed with COVID and he is also feeling fatigued, body aches and has a cough.  He denies chest pain or shortness of breath and denies nausea, vomiting or diarrhea.  His home COVID test was negative.  ED course: Temp 100.6, BP 129/84, pulse 97 and O2 sat 98% on room air Blood work mostly unremarkable with normal WBC and normal lactic acid of 1.6.  Urinalysis unremarkable.  COVID and flu pending  Imaging: Chest x-ray with no acute disease  Patient started on Rocephin and vancomycin.  Hospitalist consulted for admission.  Review of Systems: As per HPI otherwise all other systems on review of systems negative.    Past Medical History:  Diagnosis Date   Alcohol abuse    DJD (degenerative joint disease)    Hypertension 01/02/2015    History reviewed. No pertinent surgical history.   reports that he has been smoking. He has never used smokeless tobacco. He reports current alcohol use. He reports that he does not use drugs.  No Known Allergies  History reviewed. No pertinent family history.    Prior to Admission medications   Medication Sig Start Date End Date Taking? Authorizing Provider  albuterol (VENTOLIN HFA) 108 (90 Base) MCG/ACT inhaler Inhale 2 puffs  into the lungs every 6 (six) hours as needed for wheezing or shortness of breath. 01/28/21   Clapacs, Madie Reno, MD  cephALEXin (KEFLEX) 500 MG capsule Take 1 capsule (500 mg total) by mouth 3 (three) times daily for 7 days. 02/06/21 02/13/21  Duffy Bruce, MD  citalopram (CELEXA) 20 MG tablet Take 1 tablet (20 mg total) by mouth daily. 06/24/19 06/23/20  Hinda Kehr, MD  folic acid (FOLVITE) 1 MG tablet Take 1 tablet (1 mg total) by mouth daily. 11/22/20 11/22/21  Ward, Delice Bison, DO  multivitamin (ONE-A-DAY MEN'S) TABS tablet Take 1 tablet by mouth daily. 11/22/20   Ward, Delice Bison, DO  pantoprazole (PROTONIX) 40 MG tablet Take 1 tablet (40 mg total) by mouth daily. 06/24/19   Hinda Kehr, MD  sulfamethoxazole-trimethoprim (BACTRIM DS) 800-160 MG tablet Take 1 tablet by mouth 2 (two) times daily for 7 days. 02/06/21 02/13/21  Duffy Bruce, MD  thiamine 100 MG tablet Take 1 tablet (100 mg total) by mouth daily. 11/22/20   Ward, Delice Bison, DO  traZODone (DESYREL) 150 MG tablet Take 1 tablet (150 mg total) by mouth at bedtime. 06/24/19   Hinda Kehr, MD    Physical Exam: Vitals:   02/08/21 1732 02/08/21 1814 02/08/21 1942 02/08/21 1945  BP: 129/84   126/74  Pulse: 97   90  Resp: 18   19  Temp: (!) 100.6 F (38.1 C)  (!) 103 F (39.4 C)   TempSrc:  Oral  Oral   SpO2: 98%   95%  Weight:  94.2 kg    Height:  '5\' 10"'$  (1.778 m)       Vitals:   02/08/21 1732 02/08/21 1814 02/08/21 1942 02/08/21 1945  BP: 129/84   126/74  Pulse: 97   90  Resp: 18   19  Temp: (!) 100.6 F (38.1 C)  (!) 103 F (39.4 C)   TempSrc: Oral  Oral   SpO2: 98%   95%  Weight:  94.2 kg    Height:  '5\' 10"'$  (1.778 m)        Constitutional: Alert and oriented x 3 . Not in any apparent distress HEENT:      Head: Normocephalic and atraumatic.         Eyes: PERLA, EOMI, Conjunctivae are normal. Sclera is non-icteric.       Mouth/Throat: Mucous membranes are moist.       Neck: Supple with no signs of  meningismus. Cardiovascular: Regular rate and rhythm. No murmurs, gallops, or rubs. 2+ symmetrical distal pulses are present . No JVD. No LE edema Respiratory: Respiratory effort normal .Lungs sounds clear bilaterally. No wheezes, crackles, or rhonchi.  Gastrointestinal: Soft, non tender, and non distended with positive bowel sounds.  Genitourinary: No CVA tenderness. Musculoskeletal: Redness and swelling of left great toe extending to dorsum of left foot.  Tender and warm to the touch.  Laceration on plantar aspect of left great toe with well approximated Neurologic:  Face is symmetric. Moving all extremities. No gross focal neurologic deficits . Skin: See musculoskeletal Psychiatric: Mood and affect are normal    Labs on Admission: I have personally reviewed following labs and imaging studies  CBC: Recent Labs  Lab 02/08/21 1832  WBC 7.0  NEUTROABS 5.3  HGB 12.4*  HCT 36.3*  MCV 99.5  PLT [id number]*   Basic Metabolic Panel: Recent Labs  Lab 02/08/21 1832  NA 131*  K 3.7  CL 102  CO2 22  GLUCOSE 97  BUN 8  CREATININE 1.19  CALCIUM 8.6*   GFR: Estimated Creatinine Clearance: 78 mL/min (by C-G formula based on SCr of 1.19 mg/dL). Liver Function Tests: Recent Labs  Lab 02/08/21 1832  AST 31  ALT 29  ALKPHOS 73  BILITOT 0.6  PROT 7.3  ALBUMIN 4.1   No results for input(s): LIPASE, AMYLASE in the last 168 hours. No results for input(s): AMMONIA in the last 168 hours. Coagulation Profile: No results for input(s): INR, PROTIME in the last 168 hours. Cardiac Enzymes: No results for input(s): CKTOTAL, CKMB, CKMBINDEX, TROPONINI in the last 168 hours. BNP (last 3 results) No results for input(s): PROBNP in the last 8760 hours. HbA1C: No results for input(s): HGBA1C in the last 72 hours. CBG: No results for input(s): GLUCAP in the last 168 hours. Lipid Profile: No results for input(s): CHOL, HDL, LDLCALC, TRIG, CHOLHDL, LDLDIRECT in the last 72 hours. Thyroid Function  Tests: No results for input(s): TSH, T4TOTAL, FREET4, T3FREE, THYROIDAB in the last 72 hours. Anemia Panel: No results for input(s): VITAMINB12, FOLATE, FERRITIN, TIBC, IRON, RETICCTPCT in the last 72 hours. Urine analysis:    Component Value Date/Time   COLORURINE YELLOW (A) 02/08/2021 1832   APPEARANCEUR CLEAR (A) 02/08/2021 1832   APPEARANCEUR Clear 07/14/2014 0351   LABSPEC 1.013 02/08/2021 1832   LABSPEC 1.003 07/14/2014 0351   PHURINE 6.0 02/08/2021 1832   GLUCOSEU NEGATIVE 02/08/2021 1832   GLUCOSEU Negative 07/14/2014 0351   HGBUR NEGATIVE 02/08/2021  [place] 02/08/2021 [place] Negative 07/14/2014 0351   [place] 02/08/2021 1832   PROTEINUR NEGATIVE 02/08/2021 1832   NITRITE NEGATIVE 02/08/2021 1832   LEUKOCYTESUR NEGATIVE 02/08/2021 1832   LEUKOCYTESUR Negative 07/14/2014 0351    Radiological Exams on Admission: DG Chest 2 View  Result Date: 02/08/2021 CLINICAL DATA:  COVID positive. Shortness of breath. Recent injury to foot. EXAM: CHEST - 2 VIEW COMPARISON:  One-view chest x-ray 01/28/2021 FINDINGS: The heart size and mediastinal contours are within normal limits. Both lungs are clear. The visualized skeletal structures are unremarkable. IMPRESSION: Negative two view chest x-ray Electronically Signed   By: [place] Morelle M.D.   On: 02/08/2021 18:55     Assessment/Plan 60 year old male with history of COPD, HTN, tobacco use and alcohol use disorder presenting with fever and malaise s/p laceration repair of the great toe on 8/5. Also had recent covid exposure.   Cellulitis of great toe of left foot   Infected laceration -Patient with fever and pain of great toe s/p recent laceration.  Not meeting sepsis criteria - Continue Rocephin and vancomycin  -Pain control - Wound care - Podiatry consult  Cough with recent COVID exposure - Follow COVID PCR.  Home test was negative    Hypertension - Not currently on home meds  pending med rec    Alcohol use disorder, moderate, dependence - CIWA monitoring - Patient has history of withdrawal    Tobacco use disorder - Nicotine patch    COPD (chronic obstructive pulmonary disease) ([place]) - Not acutely exacerbated - DuoNebs as needed    DVT prophylaxis: Lovenox  Code Status: full code  Family Communication:  none  Disposition Plan: Back to previous home environment Consults called: none  Status:At the time of admission, it appears that the appropriate admission status for this patient is INPATIENT. This is judged to be reasonable and necessary in order to provide the required intensity of service to ensure the patient's safety given the presenting symptoms, physical exam findings, and initial radiographic and laboratory data in the context of their  Comorbid conditions.   Patient requires inpatient status due to high intensity of service, high risk for further deterioration and high frequency of surveillance required.   I certify that at the point of admission it is my clinical judgment that the patient will require inpatient hospital care spanning beyond [place] MD [facility]     02/08/2021, 8:13 PM

## 2021-02-08 NOTE — ED Notes (Signed)
ED Provider at bedside. 

## 2021-02-08 NOTE — ED Provider Notes (Signed)
[facility] Emergency Department Provider Note   ____________________________________________   Event Date/Time   First MD Initiated Contact with Patient 02/08/21 1839     (approximate)  I have reviewed the triage vital signs and the nursing notes.   HISTORY  Chief Complaint Fever    HPI Jesse Obrien is a 60 y.o. male with past medical history of hypertension, COPD, and alcohol abuse who presents to the ED complaining of fever.  Patient states that he woke up this morning feeling feverish with chills, checked his temperature and it was initially 99, however it rose as high as 102 later in the day.  He states he has been dealing with worsening pain in his left foot following evaluation for a laceration to the base of his left toe 3 days ago.  Laceration was repaired and he was started on Keflex and Bactrim, however foot has seemed more red, swollen, and painful recently.  He additionally complains of fatigue, body aches, and cough.  He states that his wife tested positive for COVID-19 3 days ago.  He has not had any chest pain, shortness of breath, nausea, vomiting, or diarrhea.  He took a home test for COVID-19 that was negative.        Past Medical History:  Diagnosis Date   Alcohol abuse    DJD (degenerative joint disease)    Hypertension 01/02/2015    Patient Active Problem List   Diagnosis Date Noted   COPD (chronic obstructive pulmonary disease) ([place]) 01/28/2021   Alcohol intoxication ([place]) 12/20/2020   Tobacco use disorder 10/28/2015   GERD (gastroesophageal reflux disease) 10/28/2015   Alcohol dependence with uncomplicated withdrawal ([place]) 03/20/2015   DDD (degenerative disc disease), lumbar 03/20/2015   Osteoarthrosis, unspecified whether generalized or localized, involving lower leg 03/20/2015   Spinal stenosis of lumbar region [id number]   Self inflicted neck laceration 01/02/2015   Hypertension 01/02/2015    History reviewed. No  pertinent surgical history.  Prior to Admission medications   Medication Sig Start Date End Date Taking? Authorizing Provider  albuterol (VENTOLIN HFA) 108 (90 Base) MCG/ACT inhaler Inhale 2 puffs into the lungs every 6 (six) hours as needed for wheezing or shortness of breath. 01/28/21   Clapacs, Madie Reno, MD  cephALEXin (KEFLEX) 500 MG capsule Take 1 capsule (500 mg total) by mouth 3 (three) times daily for 7 days. 02/06/21 02/13/21  Duffy Bruce, MD  citalopram (CELEXA) 20 MG tablet Take 1 tablet (20 mg total) by mouth daily. 06/24/19 06/23/20  Hinda Kehr, MD  folic acid (FOLVITE) 1 MG tablet Take 1 tablet (1 mg total) by mouth daily. 11/22/20 11/22/21  Ward, Delice Bison, DO  multivitamin (ONE-A-DAY MEN'S) TABS tablet Take 1 tablet by mouth daily. 11/22/20   Ward, Delice Bison, DO  pantoprazole (PROTONIX) 40 MG tablet Take 1 tablet (40 mg total) by mouth daily. 06/24/19   Hinda Kehr, MD  sulfamethoxazole-trimethoprim (BACTRIM DS) 800-160 MG tablet Take 1 tablet by mouth 2 (two) times daily for 7 days. 02/06/21 02/13/21  Duffy Bruce, MD  thiamine 100 MG tablet Take 1 tablet (100 mg total) by mouth daily. 11/22/20   Ward, Delice Bison, DO  traZODone (DESYREL) 150 MG tablet Take 1 tablet (150 mg total) by mouth at bedtime. 06/24/19   Hinda Kehr, MD    Allergies Patient has no known allergies.  History reviewed. No pertinent family history.  Social History Social History   Tobacco Use   Smoking status: Every Day  Years: 15.00    Types: Cigarettes   Smokeless tobacco: Never  Substance Use Topics   Alcohol use: Yes   Drug use: No    Review of Systems  Constitutional: Positive for fever/chills.  Positive for fatigue and malaise. Eyes: No visual changes. ENT: No sore throat. Cardiovascular: Denies chest pain. Respiratory: Denies shortness of breath. Gastrointestinal: No abdominal pain.  No nausea, no vomiting.  No diarrhea.  No constipation. Genitourinary: Negative for  dysuria. Musculoskeletal: Negative for back pain.  Positive for foot pain and swelling. Skin: Negative for rash. Neurological: Negative for headaches, focal weakness or numbness.  ____________________________________________   PHYSICAL EXAM:  VITAL SIGNS: ED Triage Vitals  Enc Vitals Group     BP 02/08/21 1732 129/84     Pulse Rate 02/08/21 1732 97     Resp 02/08/21 1732 18     Temp 02/08/21 1732 (!) 100.6 F (38.1 C)     Temp Source 02/08/21 1732 Oral     SpO2 02/08/21 1732 98 %     Weight 02/08/21 1814 207 lb 10.8 oz (94.2 kg)     Height 02/08/21 1814 '5\' 10"'$  (1.778 m)     Head Circumference --      Peak Flow --      Pain Score 02/08/21 1822 8     Pain Loc --      Pain Edu? --      Excl. in [place]? --     Constitutional: Alert and oriented. Eyes: Conjunctivae are normal. Head: Atraumatic. Nose: No congestion/rhinnorhea. Mouth/Throat: Mucous membranes are moist. Neck: Normal ROM Cardiovascular: Normal rate, regular rhythm. Grossly normal heart sounds.  2+ DP pulses bilaterally. Respiratory: Normal respiratory effort.  No retractions. Lungs CTAB. Gastrointestinal: Soft and nontender. No distention. Genitourinary: deferred Musculoskeletal: Laceration status postrepair to the base of his left great toe, necrotic flap of skin noted with no purulent drainage.  Erythema, edema, and warmth extending over the base of his left foot and into the dorsum of his left foot.  Left foot is diffusely tender to palpation. Neurologic:  Normal speech and language. No gross focal neurologic deficits are appreciated. Skin:  Skin is warm, dry and intact. No rash noted. Psychiatric: Mood and affect are normal. Speech and behavior are normal.  ____________________________________________   LABS (all labs ordered are listed, but only abnormal results are displayed)  Labs Reviewed  COMPREHENSIVE METABOLIC PANEL - Abnormal; Notable for the following components:      Result Value   Sodium 131 (*)     Calcium 8.6 (*)    All other components within normal limits  CBC WITH DIFFERENTIAL/PLATELET - Abnormal; Notable for the following components:   RBC 3.65 (*)    Hemoglobin 12.4 (*)    HCT 36.3 (*)    Platelets 140 (*)    Abs Immature Granulocytes 0.08 (*)    All other components within normal limits  URINALYSIS, COMPLETE (UACMP) WITH MICROSCOPIC - Abnormal; Notable for the following components:   Color, Urine YELLOW (*)    APPearance CLEAR (*)    All other components within normal limits  CULTURE, BLOOD (ROUTINE X 2)  CULTURE, BLOOD (ROUTINE X 2)  RESP PANEL BY RT-PCR (FLU A&B, COVID) ARPGX2  LACTIC ACID, PLASMA  LACTIC ACID, PLASMA    PROCEDURES  Procedure(s) performed (including Critical Care):  Procedures   ____________________________________________   INITIAL IMPRESSION / ASSESSMENT AND PLAN / ED COURSE      60 year old male with past medical history of hypertension,  COPD, and alcohol abuse who presents to the ED with fever starting earlier today.  He has noticed increasing pain and swelling to his foot that required laceration repair 3 days ago.  Foot does appear concerning for cellulitis, no purulence noted.  Sutures were removed and wound was irrigated, we will start on Rocephin and vancomycin for cellulitis.  Patient also endorses cough, body aches, and malaise, has been exposed to his wife who tested positive for COVID-19.  He is not in any respiratory distress and is maintaining O2 sats on room air.  Chest x-ray reviewed by me and shows no infiltrate, edema, or effusion.  Vital signs and labs are reassuring, not consistent with sepsis.  We will plan for admission to hospitalist service for further management of apparent cellulitis despite appropriate outpatient antibiotic treatment.      ____________________________________________   FINAL CLINICAL IMPRESSION(S) / ED DIAGNOSES  Final diagnoses:  Cellulitis of left lower extremity  Fever, unspecified fever  cause     ED Discharge Orders     None        Note:  This document was prepared using Dragon voice recognition software and may include unintentional dictation errors.    Blake Divine, MD 02/08/21 [phone]

## 2021-02-08 NOTE — ED Triage Notes (Signed)
Pt arrives with wife via POV with CC of fever. Pt stepped on glass on 02/05/21, was treated here, and received approximately 15 stitches to the bottom of L foot (where big toe meets sole of foot). Pt reports fatigue, fever, body aches, and pain at the site of the stitches. L foot appears red at this time and pt reports foot is more swollen than usual on the top of the foot. Reports that wife tested positive for COVID on 02/05/21.

## 2021-02-08 NOTE — Progress Notes (Signed)
Patient arrived to unit 1A room 158 in stable condition. Aox4. Reports 6/10 left foot pain. Scant drainage to L great toe noted. Plan of care reviewed with patient. Unit/room orientation completed. Call bell within reach. Bed alarm on.

## 2021-02-09 DIAGNOSIS — L03032 Cellulitis of left toe: Principal | ICD-10-CM

## 2021-02-09 LAB — CBC
HCT: 34 % — ABNORMAL LOW (ref 39.0–52.0)
Hemoglobin: 11.8 g/dL — ABNORMAL LOW (ref 13.0–17.0)
MCH: 34.8 pg — ABNORMAL HIGH (ref 26.0–34.0)
MCHC: 34.7 g/dL (ref 30.0–36.0)
MCV: 100.3 fL — ABNORMAL HIGH (ref 80.0–100.0)
Platelets: 143 10*3/uL — ABNORMAL LOW (ref 150–400)
RBC: 3.39 MIL/uL — ABNORMAL LOW (ref 4.22–5.81)
RDW: 12.7 % (ref 11.5–15.5)
WBC: 5.4 10*3/uL (ref 4.0–10.5)
nRBC: 0 % (ref 0.0–0.2)

## 2021-02-09 LAB — BASIC METABOLIC PANEL
Anion gap: 7 (ref 5–15)
BUN: 10 mg/dL (ref 6–20)
CO2: 24 mmol/L (ref 22–32)
Calcium: 8.5 mg/dL — ABNORMAL LOW (ref 8.9–10.3)
Chloride: 104 mmol/L (ref 98–111)
Creatinine, Ser: 1.24 mg/dL (ref 0.61–1.24)
GFR, Estimated: >60 mL/min (ref >60)
Glucose, Bld: 99 mg/dL (ref 70–99)
Potassium: 3.9 mmol/L (ref 3.5–5.1)
Sodium: 135 mmol/L (ref 135–145)

## 2021-02-09 LAB — HIV ANTIBODY (ROUTINE TESTING W REFLEX): HIV Screen 4th Generation wRfx: NONREACTIVE

## 2021-02-09 MED ORDER — AMOXICILLIN-POT CLAVULANATE 875-125 MG PO TABS: 1.0000 | ORAL_TABLET | Freq: Two times a day (BID) | ORAL | 0 refills | Status: AC

## 2021-02-09 MED ORDER — SULFAMETHOXAZOLE-TRIMETHOPRIM 800-160 MG PO TABS: 1.0000 | ORAL_TABLET | Freq: Two times a day (BID) | ORAL | 0 refills | Status: AC

## 2021-02-09 NOTE — Plan of Care (Signed)

## 2021-02-09 NOTE — Consult Note (Addendum)
ORTHOPAEDIC CONSULTATION  REQUESTING PHYSICIAN: Enzo Bi, MD  Chief Complaint: Left foot laceration turn for infection  HPI: Jesse Obrien is a 60 y.o. male who complains of pain and swelling to his left foot.  Apparently had a laceration a couple of days ago.  Complained of worsening pain into his left foot with swelling initially with it was closed primarily in the ER.  He developed fever with chills and pain and swelling in his foot.  Brought into the ER for further evaluation.  He states it is feeling somewhat better today.  Past Medical History:  Diagnosis Date   Alcohol abuse    DJD (degenerative joint disease)    Hypertension 01/02/2015   History reviewed. No pertinent surgical history. Social History   Socioeconomic History   Marital status: Married    Spouse name: Not on file   Number of children: Not on file   Years of education: Not on file   Highest education level: Not on file  Occupational History   Not on file  Tobacco Use   Smoking status: Every Day    Years: 15.00    Types: Cigarettes   Smokeless tobacco: Never  Substance and Sexual Activity   Alcohol use: Yes   Drug use: No   Sexual activity: Yes    Birth control/protection: Condom  Other Topics Concern   Not on file  Social History Narrative   Not on file   Social Determinants of Health   Financial Resource Strain: Not on file  Food Insecurity: Not on file  Transportation Needs: Not on file  Physical Activity: Not on file  Stress: Not on file  Social Connections: Not on file   History reviewed. No pertinent family history. No Known Allergies Prior to Admission medications   Medication Sig Start Date End Date Taking? Authorizing Provider  albuterol (VENTOLIN HFA) 108 (90 Base) MCG/ACT inhaler Inhale 2 puffs into the lungs every 6 (six) hours as needed for wheezing or shortness of breath. 01/28/21  Yes Clapacs, Madie Reno, MD  multivitamin (ONE-A-DAY MEN'S) TABS tablet Take 1 tablet by mouth daily.  11/22/20  Yes Ward, Delice Bison, DO  sulfamethoxazole-trimethoprim (BACTRIM DS) 800-160 MG tablet Take 1 tablet by mouth 2 (two) times daily for 7 days. 02/06/21 02/13/21 Yes Duffy Bruce, MD  thiamine 100 MG tablet Take 1 tablet (100 mg total) by mouth daily. 11/22/20  Yes Ward, Delice Bison, DO  cephALEXin (KEFLEX) 500 MG capsule Take 1 capsule (500 mg total) by mouth 3 (three) times daily for 7 days. 02/06/21 02/13/21  Duffy Bruce, MD  citalopram (CELEXA) 20 MG tablet Take 1 tablet (20 mg total) by mouth daily. 06/24/19 06/23/20  Hinda Kehr, MD  folic acid (FOLVITE) 1 MG tablet Take 1 tablet (1 mg total) by mouth daily. 11/22/20 11/22/21  Ward, Delice Bison, DO  pantoprazole (PROTONIX) 40 MG tablet Take 1 tablet (40 mg total) by mouth daily. 06/24/19   Hinda Kehr, MD  traZODone (DESYREL) 150 MG tablet Take 1 tablet (150 mg total) by mouth at bedtime. Patient not taking: Reported on 02/09/2021 06/24/19   Hinda Kehr, MD   DG Chest 2 View  Result Date: 02/08/2021 CLINICAL DATA:  COVID positive. Shortness of breath. Recent injury to foot. EXAM: CHEST - 2 VIEW COMPARISON:  One-view chest x-ray 01/28/2021 FINDINGS: The heart size and mediastinal contours are within normal limits. Both lungs are clear. The visualized skeletal structures are unremarkable. IMPRESSION: Negative two view chest x-ray Electronically Signed  By: San Morelle M.D.   On: 02/08/2021 18:55    Positive ROS: All other systems have been reviewed and were otherwise negative with the exception of those mentioned in the HPI and as above.  12 point ROS was performed.  Physical Exam: General: Alert and oriented.  No apparent distress.  Vascular:  Left foot:Dorsalis Pedis:  present Posterior Tibial:  present  Right foot: Dorsalis Pedis:  present Posterior Tibial:  present  Neuro:intact sensation  Derm: On the plantar aspect of the left great toe there is a noted laceration.  It looks to be primarily closed.  A little bit of  superficial necrosis at the most proximal aspect of the skin flap.  No active purulence or drainage.  No lymphangitic streaking.  No erythema diffusely to the foot.  Ortho/MS: Left foot edema is noted.  Guarded with attempted range of motion of the great toe.  Assessment: Recent laceration left foot.  No signs of active infection.  Plan: I would recommend just local wound care to this area.  No surgical debridement needed.  It does not appear to be actively infection though continuing with oral antibiotics prophylactically is warranted at this time I recommended to the patient to gently cleanse this with soap and water daily and apply a gauze pad to this wound daily.  He should avoid weightbearing to the forefoot region.  Weightbearing should be limited to the heel and minimized as much as possible.  If this continues to be problematic he can follow-up outpatient.  We discussed that likely portion of the skin will peel away but with time this should continue to heal.  Podiatry will sign off for now.    Elesa Hacker, DPM Cell [phone]   02/09/2021 7:40 AM

## 2021-02-09 NOTE — TOC Progression Note (Signed)
Transition of Care [facility]) - Progression Note    Patient Details  Name: Jesse Obrien MRN: [id number] Date of Birth: [birth year]  Transition of [facility]) CM/SW [place], RN Phone Number: 02/09/2021, 11:01 AM  Clinical Narrative:     Met with the patient at the bedside, he is not interested in Resources for Alcohol or substance abuse,  He stated that he will get a cane at [facility] if he needs one, He declines the need for any other DME, he uses a scooter to get wround and does not drive, his wife takes him other places he needs to go, he stated that he feels a lot better and wants to go home now.  I notified his nurse and the Doctor       Expected Discharge Plan and Services                                                 Social Determinants of Health (SDOH) Interventions    Readmission Risk Interventions No flowsheet data found.

## 2021-02-09 NOTE — Plan of Care (Signed)
Patient discharged home per MD orders at this time.All discharge instructions,education and medications reviewed with patient at bedside.Pt expressed understanding and will comply with dc instructions.follow up appointments also communicated to patient.no verbal c/o or any ssx of distress.patient to f/u with Dr Fowler(Podiatry) in 2 weeks time at the outpatient clinic.patient transported home by family in a private car.

## 2021-02-09 NOTE — Discharge Summary (Signed)
Physician Discharge Summary   Jesse Obrien  male DOB: 1961/04/10  PA:[id number]  PCP: Pcp, No  Admit date: 02/08/2021 Discharge date: 02/09/2021  Admitted From: home Disposition:  home CODE STATUS: Full code  Discharge Instructions     Diet - low sodium heart healthy   Complete by: As directed    Discharge wound care:   Complete by: As directed    gently cleanse the wound with soap and water daily and apply a gauze pad to this wound daily.    should avoid weightbearing to the forefoot region.  Take 7 more days of antibiotics Bactrim and Augmentin at home.  Rx send.  Follow up with Dr. Vickki Muff in outpatient clinic as needed.   Dr. Enzo Bi [facility] Course:  For full details, please see H&P, progress notes, consult notes and ancillary notes.  Briefly,  Jesse Obrien is a 60 y.o. male with medical history significant for COPD, HTN, tobacco and alcohol use, who had laceration repair to the left great toe on 8/5 and started on Keflex and Bactrim, who returns to the ED with a complaint of fever of 102 associated with chills and also more pain as well as swelling and redness of the foot stemming from the site of the laceration.    He states his wife was recently diagnosed with COVID and he is also feeling fatigued, body aches and has a cough.  Recent laceration left foot --started on Rocephin and vancomycin on admission.   --wound was cleaned in the ED.  podiatry consulted and saw no signs of active infection.  Recommended local wound care and continued abx for ppx.  avoid weightbearing to the forefoot region.   --pt was discharged on 7 days of Bactrim and Augmentin. --Follow up with Dr. Vickki Muff in outpatient clinic as needed.  Cough with recent COVID exposure Fever - COVID test neg.  Pt may have some other viral URI's.  Fever is not likely due to his local foot wound, per podiatry.   Hx of Hypertension - Not currently on home meds      Alcohol use  disorder, moderate, dependence - CIWA monitoring - Patient has history of withdrawal     Tobacco use disorder - Nicotine patch     COPD (chronic obstructive pulmonary disease) ([place]) - Not acutely exacerbated   Discharge Diagnoses:  Principal Problem:   Cellulitis of great toe of left foot Active Problems:   Infected laceration   Hypertension   Alcohol use disorder, moderate, dependence (HCC)   Tobacco use disorder   COPD (chronic obstructive pulmonary disease) ([place])   30 Day Unplanned Readmission Risk Score    Flowsheet Row ED to Hosp-Admission (Current) from 02/08/2021 in [place] (1A)  30 Day Unplanned Readmission Risk Score (%) 19.79 Filed at 02/09/2021 1200       This score is the patient's risk of an unplanned readmission within 30 days of being discharged (0 -100%). The score is based on dignosis, age, lab data, medications, orders, and past utilization.   Low:  0-14.9   Medium: 15-21.9   High: 22-29.9   Extreme: 30 and above         Discharge Instructions:  Allergies as of 02/09/2021   No Known Allergies      Medication List     STOP taking these medications    cephALEXin 500 MG capsule Commonly known as: KEFLEX   traZODone  150 MG tablet Commonly known as: DESYREL       TAKE these medications    albuterol 108 (90 Base) MCG/ACT inhaler Commonly known as: VENTOLIN HFA Inhale 2 puffs into the lungs every 6 (six) hours as needed for wheezing or shortness of breath.   amoxicillin-clavulanate 875-125 MG tablet Commonly known as: Augmentin Take 1 tablet by mouth 2 (two) times daily for 7 days. Antibiotic.   citalopram 20 MG tablet Commonly known as: CeleXA Take 1 tablet (20 mg total) by mouth daily.   folic acid 1 MG tablet Commonly known as: FOLVITE Take 1 tablet (1 mg total) by mouth daily.   multivitamin Tabs tablet Take 1 tablet by mouth daily.   pantoprazole 40 MG tablet Commonly known as:  Protonix Take 1 tablet (40 mg total) by mouth daily.   sulfamethoxazole-trimethoprim 800-160 MG tablet Commonly known as: BACTRIM DS Take 1 tablet by mouth 2 (two) times daily for 7 days. Antibiotic. What changed: additional instructions   thiamine 100 MG tablet Take 1 tablet (100 mg total) by mouth daily.               Discharge Care Instructions  (From admission, onward)           Start     Ordered   02/09/21 0000  Discharge wound care:       Comments: gently cleanse the wound with soap and water daily and apply a gauze pad to this wound daily.    should avoid weightbearing to the forefoot region.  Take 7 more days of antibiotics Bactrim and Augmentin at home.  Rx send.  Follow up with Dr. Vickki Muff in outpatient clinic as needed.   Dr. Enzo Bi - -   02/09/21 1226             Follow-up Information     Samara Deist, DPM Follow up in 2 week(s).   Specialty: Podiatry Why: if needed. Contact information: [place] [phone]                 No Known Allergies   The results of significant diagnostics from this hospitalization (including imaging, microbiology, ancillary and laboratory) are listed below for reference.   Consultations:   Procedures/Studies: DG Chest 2 View  Result Date: 02/08/2021 CLINICAL DATA:  COVID positive. Shortness of breath. Recent injury to foot. EXAM: CHEST - 2 VIEW COMPARISON:  One-view chest x-ray 01/28/2021 FINDINGS: The heart size and mediastinal contours are within normal limits. Both lungs are clear. The visualized skeletal structures are unremarkable. IMPRESSION: Negative two view chest x-ray Electronically Signed   By: San Morelle M.D.   On: 02/08/2021 18:55   DG Chest Portable 1 View  Result Date: 01/28/2021 CLINICAL DATA:  Chest pain. EXAM: PORTABLE CHEST 1 VIEW COMPARISON:  December 20, 2020 FINDINGS: The heart size and mediastinal contours are within normal  limits. Both lungs are clear. The visualized skeletal structures are unremarkable. IMPRESSION: No active disease. Electronically Signed   By: Virgina Norfolk M.D.   On: 01/28/2021 02:39   DG Foot Complete Left  Result Date: 02/06/2021 CLINICAL DATA:  Laceration with debris in wound. EXAM: LEFT FOOT - COMPLETE 3+ VIEW COMPARISON:  None. FINDINGS: Plantar forefoot laceration with bandage. No opaque foreign body or fracture. No subluxation. IMPRESSION: No opaque foreign body or fracture. Electronically Signed   By: Monte Fantasia M.D.   On: 02/06/2021 04:22      Labs: BNP (last 3  results) No results for input(s): BNP in the last 8760 hours. Basic Metabolic Panel: Recent Labs  Lab 02/08/21 1832 02/09/21 0341  NA 131* 135  K 3.7 3.9  CL 102 104  CO2 22 24  GLUCOSE 97 99  BUN 8 10  CREATININE 1.19 1.24  CALCIUM 8.6* 8.5*  MG 2.1  --   PHOS 2.3*  --    Liver Function Tests: Recent Labs  Lab 02/08/21 1832  AST 31  ALT 29  ALKPHOS 73  BILITOT 0.6  PROT 7.3  ALBUMIN 4.1   No results for input(s): LIPASE, AMYLASE in the last 168 hours. No results for input(s): AMMONIA in the last 168 hours. CBC: Recent Labs  Lab 02/08/21 1832 02/09/21 0341  WBC 7.0 5.4  NEUTROABS 5.3  --   HGB 12.4* 11.8*  HCT 36.3* 34.0*  MCV 99.5 100.3*  PLT 140* 143*   Cardiac Enzymes: No results for input(s): CKTOTAL, CKMB, CKMBINDEX, TROPONINI in the last 168 hours. BNP: Invalid input(s): POCBNP CBG: No results for input(s): GLUCAP in the last 168 hours. D-Dimer No results for input(s): DDIMER in the last 72 hours. Hgb A1c No results for input(s): HGBA1C in the last 72 hours. Lipid Profile No results for input(s): CHOL, HDL, LDLCALC, TRIG, CHOLHDL, LDLDIRECT in the last 72 hours. Thyroid function studies No results for input(s): TSH, T4TOTAL, T3FREE, THYROIDAB in the last 72 hours.  Invalid input(s): FREET3 Anemia work up No results for input(s): VITAMINB12, FOLATE, FERRITIN, TIBC,  IRON, RETICCTPCT in the last 72 hours. Urinalysis    Component Value Date/Time   COLORURINE YELLOW (A) 02/08/2021 1832   APPEARANCEUR CLEAR (A) 02/08/2021 1832   APPEARANCEUR Clear 07/14/2014 0351   LABSPEC 1.013 02/08/2021 1832   LABSPEC 1.003 07/14/2014 0351   PHURINE 6.0 02/08/2021 1832   GLUCOSEU NEGATIVE 02/08/2021 1832   GLUCOSEU Negative 07/14/2014 0351   HGBUR NEGATIVE 02/08/2021 1832   BILIRUBINUR NEGATIVE 02/08/2021 1832   BILIRUBINUR Negative 07/14/2014 0351   KETONESUR NEGATIVE 02/08/2021 1832   PROTEINUR NEGATIVE 02/08/2021 1832   NITRITE NEGATIVE 02/08/2021 1832   LEUKOCYTESUR NEGATIVE 02/08/2021 1832   LEUKOCYTESUR Negative 07/14/2014 0351   Sepsis Labs Invalid input(s): PROCALCITONIN,  WBC,  LACTICIDVEN Microbiology Recent Results (from the past 240 hour(s))  Culture, blood (routine x 2)     Status: None (Preliminary result)   Collection Time: 02/08/21  7:42 PM   Specimen: BLOOD  Result Value Ref Range Status   Specimen Description BLOOD LEFT FA  Final   Special Requests   Final    BOTTLES DRAWN AEROBIC AND ANAEROBIC Blood Culture results may not be optimal due to an inadequate volume of blood received in culture bottles   Culture   Final    NO GROWTH < 12 HOURS Performed at [facility], [address] [place], [place] [address]    Report Status PENDING  Incomplete  Culture, blood (routine x 2)     Status: None (Preliminary result)   Collection Time: 02/08/21  7:42 PM   Specimen: BLOOD  Result Value Ref Range Status   Specimen Description BLOOD RIGHT [facility]  Final   Special Requests   Final    BOTTLES DRAWN AEROBIC AND ANAEROBIC Blood Culture results may not be optimal due to an inadequate volume of blood received in culture bottles   Culture   Final    NO GROWTH < 12 HOURS Performed at [facility], [address], [place], [place] [address]    Report  Status PENDING  Incomplete  Resp Panel by RT-PCR (Flu A&B, Covid) Nasopharyngeal  Swab     Status: None   Collection Time: 02/08/21  7:42 PM   Specimen: Nasopharyngeal Swab; Nasopharyngeal(NP) swabs in vial transport medium  Result Value Ref Range Status   SARS Coronavirus 2 by RT PCR NEGATIVE NEGATIVE Final    Comment: (NOTE) SARS-CoV-2 target nucleic acids are NOT DETECTED.  The SARS-CoV-2 RNA is generally detectable in upper respiratory specimens during the acute phase of infection. The lowest concentration of SARS-CoV-2 viral copies this assay can detect is 138 copies/mL. A negative result does not preclude SARS-Cov-2 infection and should not be used as the sole basis for treatment or other patient management decisions. A negative result may occur with  improper specimen collection/handling, submission of specimen other than nasopharyngeal swab, presence of viral mutation(s) within the areas targeted by this assay, and inadequate number of viral copies(<138 copies/mL). A negative result must be combined with clinical observations, patient history, and epidemiological information. The expected result is Negative.  Fact Sheet for Patients:  EntrepreneurPulse.com.au  Fact Sheet for Healthcare Providers:  IncredibleEmployment.be  This test is no t yet approved or cleared by the [place] FDA and  has been authorized for detection and/or diagnosis of SARS-CoV-2 by FDA under an Emergency Use Authorization (EUA). This EUA will remain  in effect (meaning this test can be used) for the duration of the COVID-19 declaration under Section 564(b)(1) of the Act, 21 U.S.C.section 360bbb-3(b)(1), unless the authorization is terminated  or revoked sooner.       Influenza A by PCR NEGATIVE NEGATIVE Final   Influenza B by PCR NEGATIVE NEGATIVE Final    Comment: (NOTE) The Xpert Xpress SARS-CoV-2/FLU/RSV plus assay is intended as an aid in the diagnosis of influenza from Nasopharyngeal swab specimens and should not be used as a sole  basis for treatment. Nasal washings and aspirates are unacceptable for Xpert Xpress SARS-CoV-2/FLU/RSV testing.  Fact Sheet for Patients: EntrepreneurPulse.com.au  Fact Sheet for Healthcare Providers: IncredibleEmployment.be  This test is not yet approved or cleared by the [place] FDA and has been authorized for detection and/or diagnosis of SARS-CoV-2 by FDA under an Emergency Use Authorization (EUA). This EUA will remain in effect (meaning this test can be used) for the duration of the COVID-19 declaration under Section 564(b)(1) of the Act, 21 U.S.C. section 360bbb-3(b)(1), unless the authorization is terminated or revoked.  Performed at [facility], [address] [place], [place] [address]      Total time spend on discharging this patient, including the last patient exam, discussing the hospital stay, instructions for ongoing care as it relates to all pertinent caregivers, as well as preparing the medical discharge records, prescriptions, and/or referrals as applicable, is 50 minutes.    Enzo Bi, MD  [facility] 02/09/2021, 12:26 PM

## 2021-02-11 LAB — BLOOD CULTURE ID PANEL (REFLEXED) - BCID2

## 2021-02-13 LAB — CULTURE, BLOOD (ROUTINE X 2): Culture: NO GROWTH

## 2021-02-16 ENCOUNTER — Other Ambulatory Visit: Payer: Self-pay

## 2021-02-16 ENCOUNTER — Encounter: Payer: Self-pay | Admitting: Emergency Medicine

## 2021-02-16 ENCOUNTER — Emergency Department
Admission: EM | Admit: 2021-02-16 | Discharge: 2021-02-16 | Disposition: A | Discharge status: Home / Self Care | Attending: Emergency Medicine | Admitting: Emergency Medicine

## 2021-02-16 DIAGNOSIS — Z5321 Procedure and treatment not carried out due to patient leaving prior to being seen by health care provider: Secondary | ICD-10-CM | POA: Insufficient documentation

## 2021-02-16 DIAGNOSIS — X58XXXD Exposure to other specified factors, subsequent encounter: Secondary | ICD-10-CM | POA: Insufficient documentation

## 2021-02-16 DIAGNOSIS — F10929 Alcohol use, unspecified with intoxication, unspecified: Secondary | ICD-10-CM | POA: Diagnosis not present

## 2021-02-16 DIAGNOSIS — Z4800 Encounter for change or removal of nonsurgical wound dressing: Secondary | ICD-10-CM | POA: Diagnosis not present

## 2021-02-16 DIAGNOSIS — S99922D Unspecified injury of left foot, subsequent encounter: Secondary | ICD-10-CM | POA: Diagnosis present

## 2021-02-16 DIAGNOSIS — S91302D Unspecified open wound, left foot, subsequent encounter: Secondary | ICD-10-CM | POA: Insufficient documentation

## 2021-02-16 NOTE — ED Triage Notes (Signed)
Pt called back to room from triage. Pt reports he called his ride and is not being seen.

## 2021-02-16 NOTE — ED Triage Notes (Signed)
EMS brings pt in from home for recheck of left foot wound that he received stitches in and had removal; taking antibiotics as rx and denies any c/o at this time; +ETOH; f/u appt is 8/23  with podiatry but wanted wound rechecked

## 2021-02-21 ENCOUNTER — Emergency Department
Admission: EM | Admit: 2021-02-21 | Discharge: 2021-02-21 | Discharge status: Against Medical Advice | Attending: Emergency Medicine | Admitting: Emergency Medicine

## 2021-02-21 ENCOUNTER — Other Ambulatory Visit: Payer: Self-pay

## 2021-02-21 DIAGNOSIS — L089 Local infection of the skin and subcutaneous tissue, unspecified: Secondary | ICD-10-CM | POA: Insufficient documentation

## 2021-02-21 DIAGNOSIS — Z5321 Procedure and treatment not carried out due to patient leaving prior to being seen by health care provider: Secondary | ICD-10-CM | POA: Insufficient documentation

## 2021-02-21 LAB — COMPREHENSIVE METABOLIC PANEL
ALT: 34 U/L (ref 0–44)
AST: 35 U/L (ref 15–41)
Albumin: 4.4 g/dL (ref 3.5–5.0)
Alkaline Phosphatase: 77 U/L (ref 38–126)
Anion gap: 12 (ref 5–15)
BUN: <5 mg/dL — ABNORMAL LOW (ref 6–20)
CO2: 20 mmol/L — ABNORMAL LOW (ref 22–32)
Calcium: 8.6 mg/dL — ABNORMAL LOW (ref 8.9–10.3)
Chloride: 108 mmol/L (ref 98–111)
Creatinine, Ser: 0.7 mg/dL (ref 0.61–1.24)
GFR, Estimated: >60 mL/min (ref >60)
Glucose, Bld: 114 mg/dL — ABNORMAL HIGH (ref 70–99)
Potassium: 3.8 mmol/L (ref 3.5–5.1)
Sodium: 140 mmol/L (ref 135–145)
Total Bilirubin: 0.6 mg/dL (ref 0.3–1.2)
Total Protein: 8.1 g/dL (ref 6.5–8.1)

## 2021-02-21 LAB — CBC WITH DIFFERENTIAL/PLATELET
Abs Immature Granulocytes: 0.3 10*3/uL — ABNORMAL HIGH (ref 0.00–0.07)
Basophils Absolute: 0.3 10*3/uL — ABNORMAL HIGH (ref 0.0–0.1)
Basophils Relative: 4 %
Eosinophils Absolute: 0.2 10*3/uL (ref 0.0–0.5)
Eosinophils Relative: 2 %
HCT: 42.4 % (ref 39.0–52.0)
Hemoglobin: 14.5 g/dL (ref 13.0–17.0)
Lymphocytes Relative: 37 %
Lymphs Abs: 3.1 10*3/uL (ref 0.7–4.0)
MCH: 33.6 pg (ref 26.0–34.0)
MCHC: 34.2 g/dL (ref 30.0–36.0)
MCV: 98.1 fL (ref 80.0–100.0)
Metamyelocytes Relative: 2 %
Monocytes Absolute: 0.6 10*3/uL (ref 0.1–1.0)
Monocytes Relative: 7 %
Myelocytes: 2 %
Neutro Abs: 3.9 10*3/uL (ref 1.7–7.7)
Neutrophils Relative %: 46 %
Platelets: 262 10*3/uL (ref 150–400)
RBC: 4.32 MIL/uL (ref 4.22–5.81)
RDW: 13 % (ref 11.5–15.5)
WBC: 8.5 10*3/uL (ref 4.0–10.5)
nRBC: 0 % (ref 0.0–0.2)

## 2021-02-21 LAB — LACTIC ACID, PLASMA: Lactic Acid, Venous: 1.8 mmol/L (ref 0.5–1.9)

## 2021-02-21 NOTE — ED Notes (Signed)
Pt told screener he was leaving and going to urgent care

## 2021-02-21 NOTE — ED Notes (Signed)
[employer] to release pt

## 2021-02-21 NOTE — ED Triage Notes (Signed)
Pt states he cut his left foot on August 5th and it got infected- pt states he was admitted to the hospital for it and that "they didn't clean it right"- pt here under custody of [employer]

## 2021-03-02 ENCOUNTER — Other Ambulatory Visit: Payer: Self-pay

## 2021-03-02 ENCOUNTER — Emergency Department
Admission: EM | Admit: 2021-03-02 | Discharge: 2021-03-02 | Disposition: A | Discharge status: Home / Self Care | Attending: Emergency Medicine | Admitting: Emergency Medicine

## 2021-03-02 DIAGNOSIS — M79674 Pain in right toe(s): Secondary | ICD-10-CM | POA: Insufficient documentation

## 2021-03-02 DIAGNOSIS — Z5321 Procedure and treatment not carried out due to patient leaving prior to being seen by health care provider: Secondary | ICD-10-CM | POA: Diagnosis not present

## 2021-03-02 NOTE — ED Notes (Signed)
No answer when called several times from lobby 

## 2021-03-02 NOTE — ED Triage Notes (Signed)
Pt states he has lac to left great toe from 8/5 on a piece of glass and was seen for the same. States a few days later was admitted for infection tot he same. Pt has seen Dr. Vickki Muff for follow up a week ago which states just did a dressing change. Pt here for persistent pain to area. Positive etoh, states 15pk beer.

## 2021-04-12 ENCOUNTER — Emergency Department
Admission: EM | Admit: 2021-04-12 | Discharge: 2021-04-13 | Disposition: A | Discharge status: Home / Self Care | Attending: Emergency Medicine | Admitting: Emergency Medicine

## 2021-04-12 ENCOUNTER — Other Ambulatory Visit: Payer: Self-pay

## 2021-04-12 DIAGNOSIS — S1181XA Laceration without foreign body of other specified part of neck, initial encounter: Secondary | ICD-10-CM | POA: Diagnosis not present

## 2021-04-12 DIAGNOSIS — F10120 Alcohol abuse with intoxication, uncomplicated: Secondary | ICD-10-CM | POA: Insufficient documentation

## 2021-04-12 DIAGNOSIS — F331 Major depressive disorder, recurrent, moderate: Secondary | ICD-10-CM | POA: Insufficient documentation

## 2021-04-12 DIAGNOSIS — S199XXA Unspecified injury of neck, initial encounter: Secondary | ICD-10-CM | POA: Diagnosis present

## 2021-04-12 DIAGNOSIS — Y908 Blood alcohol level of 240 mg/100 ml or more: Secondary | ICD-10-CM | POA: Insufficient documentation

## 2021-04-12 DIAGNOSIS — J449 Chronic obstructive pulmonary disease, unspecified: Secondary | ICD-10-CM | POA: Insufficient documentation

## 2021-04-12 DIAGNOSIS — Z79899 Other long term (current) drug therapy: Secondary | ICD-10-CM | POA: Insufficient documentation

## 2021-04-12 DIAGNOSIS — F4325 Adjustment disorder with mixed disturbance of emotions and conduct: Secondary | ICD-10-CM | POA: Diagnosis not present

## 2021-04-12 DIAGNOSIS — F17210 Nicotine dependence, cigarettes, uncomplicated: Secondary | ICD-10-CM | POA: Insufficient documentation

## 2021-04-12 DIAGNOSIS — S1191XA Laceration without foreign body of unspecified part of neck, initial encounter: Secondary | ICD-10-CM

## 2021-04-12 DIAGNOSIS — E876 Hypokalemia: Secondary | ICD-10-CM

## 2021-04-12 DIAGNOSIS — F10129 Alcohol abuse with intoxication, unspecified: Secondary | ICD-10-CM | POA: Diagnosis present

## 2021-04-12 DIAGNOSIS — X789XXA Intentional self-harm by unspecified sharp object, initial encounter: Secondary | ICD-10-CM | POA: Insufficient documentation

## 2021-04-12 DIAGNOSIS — I10 Essential (primary) hypertension: Secondary | ICD-10-CM | POA: Diagnosis not present

## 2021-04-12 DIAGNOSIS — F10929 Alcohol use, unspecified with intoxication, unspecified: Secondary | ICD-10-CM | POA: Diagnosis present

## 2021-04-12 DIAGNOSIS — F10920 Alcohol use, unspecified with intoxication, uncomplicated: Secondary | ICD-10-CM

## 2021-04-12 LAB — SALICYLATE LEVEL: Salicylate Lvl: <7 mg/dL — ABNORMAL LOW (ref 7.0–30.0)

## 2021-04-12 LAB — CBC
HCT: 42.1 % (ref 39.0–52.0)
Hemoglobin: 14.5 g/dL (ref 13.0–17.0)
MCH: 33.4 pg (ref 26.0–34.0)
MCHC: 34.4 g/dL (ref 30.0–36.0)
MCV: 97 fL (ref 80.0–100.0)
Platelets: 188 10*3/uL (ref 150–400)
RBC: 4.34 MIL/uL (ref 4.22–5.81)
RDW: 12.3 % (ref 11.5–15.5)
WBC: 9.9 10*3/uL (ref 4.0–10.5)
nRBC: 0 % (ref 0.0–0.2)

## 2021-04-12 LAB — COMPREHENSIVE METABOLIC PANEL
ALT: 38 U/L (ref 0–44)
AST: 36 U/L (ref 15–41)
Albumin: 4.2 g/dL (ref 3.5–5.0)
Alkaline Phosphatase: 69 U/L (ref 38–126)
Anion gap: 10 (ref 5–15)
BUN: 8 mg/dL (ref 6–20)
CO2: 25 mmol/L (ref 22–32)
Calcium: 8.6 mg/dL — ABNORMAL LOW (ref 8.9–10.3)
Chloride: 100 mmol/L (ref 98–111)
Creatinine, Ser: 0.83 mg/dL (ref 0.61–1.24)
GFR, Estimated: >60 mL/min (ref >60)
Glucose, Bld: 95 mg/dL (ref 70–99)
Potassium: 3.3 mmol/L — ABNORMAL LOW (ref 3.5–5.1)
Sodium: 135 mmol/L (ref 135–145)
Total Bilirubin: 0.7 mg/dL (ref 0.3–1.2)
Total Protein: 7.3 g/dL (ref 6.5–8.1)

## 2021-04-12 LAB — ETHANOL: Alcohol, Ethyl (B): 292 mg/dL — ABNORMAL HIGH (ref <10)

## 2021-04-12 LAB — ACETAMINOPHEN LEVEL: Acetaminophen (Tylenol), Serum: <10 ug/mL — ABNORMAL LOW (ref 10–30)

## 2021-04-12 MED ORDER — LORAZEPAM 2 MG/ML IJ SOLN
0.0000 mg | Freq: Four times a day (QID) | INTRAMUSCULAR | Status: DC
  Administered 2021-04-13: 2 mg via INTRAVENOUS
  Filled 2021-04-12: qty 1

## 2021-04-12 MED ORDER — THIAMINE HCL 100 MG PO TABS: 100.0000 mg | ORAL_TABLET | Freq: Every day | ORAL | Status: DC

## 2021-04-12 MED ORDER — LORAZEPAM 2 MG PO TABS: 0.0000 mg | ORAL_TABLET | Freq: Four times a day (QID) | ORAL | Status: DC

## 2021-04-12 MED ORDER — THIAMINE HCL 100 MG/ML IJ SOLN: 100.0000 mg | Freq: Every day | INTRAMUSCULAR | Status: DC

## 2021-04-12 NOTE — ED Triage Notes (Addendum)
Pt brought in by [facility] for depression and suicide attempt. Pt has self inflicted laceration to neck with razor. Positive etoh.

## 2021-04-12 NOTE — ED Provider Notes (Signed)
[facility] Emergency Department Provider Note   ____________________________________________   Event Date/Time   First MD Initiated Contact with Patient 04/12/21 2312     (approximate)  I have reviewed the triage vital signs and the nursing notes.   HISTORY  Chief Complaint Suicide Attempt  Level of V caveat: Limited by intoxication  HPI Jesse Obrien is a 60 y.o. male brought to the ED via EMS from home under IVC for self-inflicted laceration to his neck with a razor blade. + EtOH.  Reports tetanus is up-to-date.  Currently denies HI/AH/VH.  Voices no medical complaints.      Past Medical History:  Diagnosis Date  . Alcohol abuse   . DJD (degenerative joint disease)   . Hypertension 01/02/2015    Patient Active Problem List   Diagnosis Date Noted  . Cellulitis of great toe of left foot 02/08/2021  . COPD (chronic obstructive pulmonary disease) ([place]) 01/28/2021  . Alcohol intoxication ([place]) 12/20/2020  . Tobacco use disorder 10/28/2015  . GERD (gastroesophageal reflux disease) 10/28/2015  . Alcohol use disorder, moderate, dependence ([place]) 03/20/2015  . DDD (degenerative disc disease), lumbar 03/20/2015  . Osteoarthrosis, unspecified whether generalized or localized, involving lower leg 03/20/2015  . Spinal stenosis of lumbar region 03/20/2015  . Infected laceration 01/02/2015  . Hypertension 01/02/2015    No past surgical history on file.  Prior to Admission medications   Medication Sig Start Date End Date Taking? Authorizing Provider  albuterol (VENTOLIN HFA) 108 (90 Base) MCG/ACT inhaler Inhale 2 puffs into the lungs every 6 (six) hours as needed for wheezing or shortness of breath. 01/28/21  Yes Clapacs, Madie Reno, MD  citalopram (CELEXA) 20 MG tablet Take 1 tablet (20 mg total) by mouth daily. 06/24/19 06/23/20  Hinda Kehr, MD  folic acid (FOLVITE) 1 MG tablet Take 1 tablet (1 mg total) by mouth daily. Patient not taking: No sig  reported 11/22/20 11/22/21  Ward, Delice Bison, DO  multivitamin (ONE-A-DAY MEN'S) TABS tablet Take 1 tablet by mouth daily. Patient not taking: No sig reported 11/22/20   Ward, Cyril Mourning N, DO  pantoprazole (PROTONIX) 40 MG tablet Take 1 tablet (40 mg total) by mouth daily. Patient not taking: No sig reported 06/24/19   Hinda Kehr, MD  thiamine 100 MG tablet Take 1 tablet (100 mg total) by mouth daily. Patient not taking: No sig reported 11/22/20   Ward, Delice Bison, DO    Allergies Patient has no known allergies.  No family history on file.  Social History Social History   Tobacco Use  . Smoking status: Every Day    Years: 15.00    Types: Cigarettes  . Smokeless tobacco: Never  Vaping Use  . Vaping Use: Never used  Substance Use Topics  . Alcohol use: Yes  . Drug use: No    Review of Systems  Constitutional: No fever/chills Eyes: No visual changes. ENT: No sore throat. Cardiovascular: Denies chest pain. Respiratory: Denies shortness of breath. Gastrointestinal: No abdominal pain.  No nausea, no vomiting.  No diarrhea.  No constipation. Genitourinary: Negative for dysuria. Musculoskeletal: Negative for back pain. Skin: Negative for rash. Neurological: Negative for headaches, focal weakness or numbness. Psychiatric: Positive for self-inflicted wound.  ____________________________________________   PHYSICAL EXAM:  VITAL SIGNS: ED Triage Vitals  Enc Vitals Group     BP 04/12/21 2206 111/80     Pulse Rate 04/12/21 2206 96     Resp 04/12/21 2206 20     Temp 04/12/21  2206 98.2 F (36.8 C)     Temp Source 04/12/21 2206 Oral     SpO2 04/12/21 2206 98 %     Weight 04/12/21 2207 205 lb (93 kg)     Height 04/12/21 2207 5\' 7"  (1.702 m)     Head Circumference --      Peak Flow --      Pain Score 04/12/21 2207 7     Pain Loc --      Pain Edu? --      Excl. in [place]? --     Constitutional: Alert and oriented.  Intoxicated appearing and in no acute distress. Eyes:  Conjunctivae are normal. PERRL. EOMI. Head: Atraumatic. Nose: No congestion/rhinnorhea. Mouth/Throat: Mucous membranes are moist.   Neck: No stridor.  Superficial less than 1 cm horizontally linear laceration to left anterior neck without active bleeding. Cardiovascular: Normal rate, regular rhythm. Grossly normal heart sounds.  Good peripheral circulation. Respiratory: Normal respiratory effort.  No retractions. Lungs CTAB. Gastrointestinal: Soft and nontender. No distention. No abdominal bruits. No CVA tenderness. Musculoskeletal: No lower extremity tenderness nor edema.  No joint effusions. Neurologic:  Normal speech and language. No gross focal neurologic deficits are appreciated. Skin:  Skin is warm, dry and intact. No rash noted. Psychiatric: Mood and affect are loud, aggressive. Speech and behavior are loud, aggressive.  ____________________________________________   LABS (all labs ordered are listed, but only abnormal results are displayed)  Labs Reviewed  COMPREHENSIVE METABOLIC PANEL  ETHANOL  SALICYLATE LEVEL  ACETAMINOPHEN LEVEL  CBC  URINE DRUG SCREEN, QUALITATIVE ([place])   ____________________________________________  EKG  None ____________________________________________  RADIOLOGY I, Joclynn Lumb J, personally viewed and evaluated these images (plain radiographs) as part of my medical decision making, as well as reviewing the written report by the radiologist.  ED MD interpretation: None  Official radiology report(s): No results found.  ____________________________________________   PROCEDURES  Procedure(s) performed (including Critical Care):  Marland KitchenMarland KitchenLaceration Repair  Date/Time: 04/12/2021 11:37 PM Performed by: Paulette Blanch, MD Authorized by: Paulette Blanch, MD   Consent:    Consent obtained:  Verbal   Consent given by:  Patient   Risks discussed:  Infection, pain, poor cosmetic result and poor wound healing Anesthesia:    Anesthesia method:   None Laceration details:    Location:  Neck   Neck location:  L anterior   Length (cm):  1   Depth (mm):  2 Exploration:    Contaminated: no   Treatment:    Area cleansed with:  Saline   Amount of cleaning:  Standard   Irrigation solution:  Sterile saline   Visualized foreign bodies/material removed: no   Skin repair:    Repair method:  Steri-Strips and tissue adhesive   Number of Steri-Strips:  2 Approximation:    Approximation:  Close Repair type:    Repair type:  Simple Post-procedure details:    Procedure completion:  Tolerated well, no immediate complications   ____________________________________________   INITIAL IMPRESSION / ASSESSMENT AND PLAN / ED COURSE  As part of my medical decision making, I reviewed the following data within the electronic MEDICAL RECORD NUMBER Nursing notes reviewed and incorporated, Labs reviewed, Old chart reviewed, A consult was requested and obtained from this/these consultant(s) Psychiatry, and Notes from prior ED visits     60 year old male brought under IVC for self-inflicted laceration. The patient has been placed in psychiatric observation due to the need to provide a safe environment for the patient while  obtaining psychiatric consultation and evaluation, as well as ongoing medical and medication management to treat the patient's condition.  The patient has been placed under full IVC at this time.       ____________________________________________   FINAL CLINICAL IMPRESSION(S) / ED DIAGNOSES  Final diagnoses:  Alcoholic intoxication without complication (HCC)  Moderate episode of recurrent major depressive disorder [facility])     ED Discharge Orders     None        Note:  This document was prepared using Dragon voice recognition software and may include unintentional dictation errors.    Paulette Blanch, MD 04/13/21 [phone]

## 2021-04-12 NOTE — ED Notes (Addendum)
Seen pt In room at this time, AAO, breathing even-unlabored. Wearing hospital gown, reports feeling depressed .  1:1 sitter at bedside at this time

## 2021-04-12 NOTE — ED Notes (Signed)
Notified lab blood orders in for this pt , specimen previously sent

## 2021-04-12 NOTE — ED Notes (Signed)
ER MD at bedside

## 2021-04-13 DIAGNOSIS — F4325 Adjustment disorder with mixed disturbance of emotions and conduct: Secondary | ICD-10-CM

## 2021-04-13 LAB — URINE DRUG SCREEN, QUALITATIVE (ARMC ONLY)
Amphetamines, Ur Screen: NOT DETECTED
Barbiturates, Ur Screen: NOT DETECTED
Benzodiazepine, Ur Scrn: NOT DETECTED
Cannabinoid 50 Ng, Ur ~~LOC~~: NOT DETECTED
Cocaine Metabolite,Ur ~~LOC~~: NOT DETECTED
MDMA (Ecstasy)Ur Screen: NOT DETECTED
Methadone Scn, Ur: NOT DETECTED
Opiate, Ur Screen: NOT DETECTED
Phencyclidine (PCP) Ur S: NOT DETECTED
Tricyclic, Ur Screen: NOT DETECTED

## 2021-04-13 MED ORDER — HALOPERIDOL LACTATE 5 MG/ML IJ SOLN
INTRAMUSCULAR | Status: AC
  Administered 2021-04-13: 5 mg
  Filled 2021-04-13: qty 1

## 2021-04-13 MED ORDER — POTASSIUM CHLORIDE CRYS ER 20 MEQ PO TBCR
40.0000 meq | EXTENDED_RELEASE_TABLET | Freq: Once | ORAL | Status: AC
  Administered 2021-04-13: 40 meq via ORAL
  Filled 2021-04-13: qty 2

## 2021-04-13 MED ORDER — DIPHENHYDRAMINE HCL 50 MG/ML IJ SOLN
INTRAMUSCULAR | Status: AC
  Administered 2021-04-13: 50 mg
  Filled 2021-04-13: qty 1

## 2021-04-13 NOTE — BH Assessment (Signed)
This Probation officer attempted to assess pt, along with Kennyth Lose T.,NP however pt was unable to participate in the assessment due to being medicated.

## 2021-04-13 NOTE — ED Notes (Signed)
IVC PAPERS  RESCINDED PER  DR  CLAPACS MD  INFORMED  KATIE  RN

## 2021-04-13 NOTE — ED Notes (Signed)
Patient was half way dressed out. Patient finished changing out for [place] this morning. Patient had [employer], wallet, and Cell phone still on him. Reported to [place].

## 2021-04-13 NOTE — BH Assessment (Signed)
Comprehensive Clinical Assessment (CCA) Note  04/13/2021 Jesse Obrien [id number]  [name], 60 year old male who presents to [facility] ED involuntarily for treatment. Per triage note, Pt brought in by [facility] for depression and suicide attempt. Pt has self inflicted laceration to neck with razor. Positive etoh.   During TTS assessment pt presents alert and oriented x 4, restless but cooperative, and mood-congruent with affect. The pt does not appear to be responding to internal or external stimuli. Neither is the pt presenting with any delusional thinking. Pt verified the information provided to triage RN.   Pt identifies his main complaint to be that he had 3 teeth pulled on yesterday and was unable to find relief from the pain. Patient reports that is the reason why he began drinking. Per previous hx, when patient drinks, he becomes suicidal and will attempt to cut his neck. Patient reports he was not provided with pain medication from the dentist and over the counter meds were not helpful. Patient states he has minimized his drinking to once a week. 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 Patient states that overall his mood has been "good". Patient lives with his wife and denies using any illicit substances. Patient does not currently take any medications. Pt denies SI/HI/AH/VH. Pt is able to contract for safety.    Per Dr. Weber Cooks pt does not meet criteria for inpatient psychiatric admission and can be discharged.   Chief Complaint:  Chief Complaint  Patient presents with   Suicide Attempt   Visit Diagnosis: Alcohol use    CCA Screening, Triage and Referral (STR)  Patient Reported Information How did you hear about [place]? -- (EMS)  Referral name: EMS  Referral phone number: No data recorded  Whom do you see for routine medical problems? No data recorded Practice/Facility Name: No data recorded Practice/Facility Phone Number: No data recorded Name of Contact:  No data recorded Contact Number: No data recorded Contact Fax Number: No data recorded Prescriber Name: No data recorded Prescriber Address (if known): No data recorded  What Is the Reason for Your Visit/Call Today? Patient brought in for drinking and SI.  How Long Has This Been Causing You Problems? > than 6 months  What Do You Feel Would Help You the Most Today? Alcohol or Drug Use Treatment   Have You Recently Been in Any Inpatient Treatment (Hospital/Detox/Crisis Center/28-Day Program)? No  Name/Location of Program/Hospital:No data recorded How Long Were You There? No data recorded When Were You Discharged? No data recorded  Have You Ever Received Services From [facility] Before? No data recorded Who Do You See at [facility]? No data recorded  Have You Recently Had Any Thoughts About Hurting Yourself? Yes  Are You Planning to Commit Suicide/Harm Yourself At This time? No   Have you Recently Had Thoughts About [place]? No  Explanation: No data recorded  Have You Used Any Alcohol or Drugs in the Past 24 Hours? Yes  How Long Ago Did You Use Drugs or Alcohol? No data recorded What Did You Use and How Much? Alcohol- unknown   Do You Currently Have a Therapist/Psychiatrist? No  Name of Therapist/Psychiatrist: No data recorded  Have You Been Recently Discharged From Any Office Practice or Programs? No  Explanation of Discharge From Practice/Program: No data recorded    CCA Screening Triage Referral Assessment Type of Contact: Face-to-Face  Is this Initial or Reassessment? No data recorded Date Telepsych consult ordered in [facility]:  No data recorded Time Telepsych consult ordered in [facility]:  No data recorded  Patient Reported Information Reviewed? Yes  Patient Left Without Being Seen? No data recorded Reason for Not Completing Assessment: No data recorded  Collateral Involvement: None provided   Does Patient Have a [place]? No  data recorded Name and Contact of Legal Guardian: No data recorded If Minor and Not Living with Parent(s), Who has Custody? n/a  Is CPS involved or ever been involved? Never  Is APS involved or ever been involved? Never   Patient Determined To Be At Risk for Harm To Self or Others Based on Review of Patient Reported Information or Presenting Complaint? No  Method: No data recorded Availability of Means: No data recorded Intent: No data recorded Notification Required: No data recorded Additional Information for Danger to Others Potential: No data recorded Additional Comments for Danger to Others Potential: No data recorded Are There Guns or Other Weapons in Your Home? No data recorded Types of Guns/Weapons: No data recorded Are These Weapons Safely Secured?                            No data recorded Who Could Verify You Are Able To Have These Secured: No data recorded Do You Have any Outstanding Charges, Pending Court Dates, Parole/Probation? No data recorded Contacted To Inform of Risk of Harm To Self or Others: No data recorded  Location of Assessment: [facility] ED   Does Patient Present under Involuntary Commitment? Yes  IVC Papers Initial File Date: 04/12/21   [place] of Residence:    Patient Currently Receiving the Following Services: Not Receiving Services   Determination of Need: Urgent (48 hours)   Options For Referral: Chemical Dependency Intensive Outpatient Therapy (CDIOP); ED Visit; Outpatient Therapy     Recommendations for Services/Supports/Treatments:    DSM5 Diagnoses: Patient Active Problem List   Diagnosis Date Noted   Cellulitis of great toe of left foot 02/08/2021   COPD (chronic obstructive pulmonary disease) ([place]) 01/28/2021   Alcohol intoxication ([place]) 12/20/2020   Tobacco use disorder 10/28/2015   GERD (gastroesophageal reflux disease) 10/28/2015   Alcohol use disorder, moderate, dependence ([place]) 03/20/2015   DDD (degenerative disc  disease), lumbar 03/20/2015   Osteoarthrosis, unspecified whether generalized or localized, involving lower leg 03/20/2015   Spinal stenosis of lumbar region 03/20/2015   Infected laceration 01/02/2015   Hypertension 01/02/2015    Patient Centered Plan: Patient is on the following Treatment Plan(s):  Substance Abuse   Referrals to Alternative Service(s): Referred to Alternative Service(s):   Place:   Date:   Time:    Referred to Alternative Service(s):   Place:   Date:   Time:    Referred to Alternative Service(s):   Place:   Date:   Time:    Referred to Alternative Service(s):   Place:   Date:   Time:     Shonte Soderlund Glennon Mac, Counselor, LCAS-A

## 2021-04-13 NOTE — ED Notes (Signed)
IVC/Psych Consult ordered/Pending

## 2021-04-13 NOTE — ED Notes (Signed)
IVC/pending psych consult 

## 2021-04-13 NOTE — ED Notes (Signed)
Report given to Amy, RN.

## 2021-04-13 NOTE — ED Notes (Signed)
Pt speaking with MD Clapacs.
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
## 2021-04-13 NOTE — Consult Note (Signed)
[facility] Face-to-Face Psychiatry Consult   Reason for Consult: Consult for 60 year old man with a history of alcohol abuse came to the emergency room after cutting himself lightly on the neck Referring Physician: Starleen Blue Patient Identification: Jesse Obrien MRN:  [id number] Principal Diagnosis: Adjustment disorder with mixed disturbance of emotions and conduct Diagnosis:  Principal Problem:   Adjustment disorder with mixed disturbance of emotions and conduct Active Problems:   Alcohol intoxication ([place])   Total Time spent with patient: 1 hour  Subjective:   Jesse Obrien is a 60 y.o. male patient admitted with "I was drinking".  HPI: Patient seen chart reviewed.  Patient known from previous encounters.  59 year old man with a history of alcohol abuse and PTSD.  Patient states that he had been drinking last night.  He drinks by his account maybe once a week.  Yesterday he had several teeth extracted and was having pain from them which may have increased the amount that he consumed.  When he got here to the emergency room his blood alcohol level was well over 300.  Patient had cut himself very superficially on the neck.  So superficially I could not see a marked by the time he woke up.  Patient denied any suicidal thought.  Denied suicidal intention.  Denied psychotic symptoms.  Denied abuse of other drugs.  He follows up for his primary care with the [facility] but resists treating his PTSD and alcohol abuse.  Not taking any psychiatric medicine.  Patient is calm and alert and oriented and lucid without any ongoing psychotic symptoms.  Past Psychiatric History: Past history of multiple visits to the emergency room for similar presentations.  When patient drinks he tends to get very intoxicated and when he does he often cuts himself.  When sober he is not known to typically act on any suicidal ideation.  Past diagnosis of PTSD and depression but resistant to following up with treatment.  Risk to Self:    Risk to Others:   Prior Inpatient Therapy:   Prior Outpatient Therapy:    Past Medical History:  Past Medical History:  Diagnosis Date   Alcohol abuse    DJD (degenerative joint disease)    Hypertension 01/02/2015   No past surgical history on file. Family History: No family history on file. Family Psychiatric  History: See previous Social History:  Social History   Substance and Sexual Activity  Alcohol Use Yes     Social History   Substance and Sexual Activity  Drug Use No    Social History   Socioeconomic History   Marital status: Married    Spouse name: Not on file   Number of children: Not on file   Years of education: Not on file   Highest education level: Not on file  Occupational History   Not on file  Tobacco Use   Smoking status: Every Day    Years: 15.00    Types: Cigarettes   Smokeless tobacco: Never  Vaping Use   Vaping Use: Never used  Substance and Sexual Activity   Alcohol use: Yes   Drug use: No   Sexual activity: Yes    Birth control/protection: Condom  Other Topics Concern   Not on file  Social History Narrative   Not on file   Social Determinants of Health   Financial Resource Strain: Not on file  Food Insecurity: Not on file  Transportation Needs: Not on file  Physical Activity: Not on file  Stress: Not on file  Social Connections: Not on file   Additional Social History:    Allergies:  No Known Allergies  Labs:  Results for orders placed or performed during the hospital encounter of 04/12/21 (from the past 48 hour(s))  Comprehensive metabolic panel     Status: Abnormal   Collection Time: 04/12/21 10:25 PM  Result Value Ref Range   Sodium 135 135 - 145 mmol/L   Potassium 3.3 (L) 3.5 - 5.1 mmol/L   Chloride 100 98 - 111 mmol/L   CO2 25 22 - 32 mmol/L   Glucose, Bld 95 70 - 99 mg/dL    Comment: Glucose reference range applies only to samples taken after fasting for at least 8 hours.   BUN 8 6 - 20 mg/dL   Creatinine, Ser  0.83 0.61 - 1.24 mg/dL   Calcium 8.6 (L) 8.9 - 10.3 mg/dL   Total Protein 7.3 6.5 - 8.1 g/dL   Albumin 4.2 3.5 - 5.0 g/dL   AST 36 15 - 41 U/L   ALT 38 0 - 44 U/L   Alkaline Phosphatase 69 38 - 126 U/L   Total Bilirubin 0.7 0.3 - 1.2 mg/dL   GFR, Estimated >60 >60 mL/min    Comment: (NOTE) Calculated using the CKD-EPI Creatinine Equation (2021)    Anion gap 10 5 - 15    Comment: Performed at [facility], [address], [place], [place] [address]  Ethanol     Status: Abnormal   Collection Time: 04/12/21 10:25 PM  Result Value Ref Range   Alcohol, Ethyl (B) 292 (H) <10 mg/dL    Comment: (NOTE) Lowest detectable limit for serum alcohol is 10 mg/dL.  For medical purposes only. Performed at [facility], [address] [place], [place] [address]   Salicylate level     Status: Abnormal   Collection Time: 04/12/21 10:25 PM  Result Value Ref Range   Salicylate Lvl <1.4 (L) 7.0 - 30.0 mg/dL    Comment: Performed at [facility], [place], [place] [address]  Acetaminophen level     Status: Abnormal   Collection Time: 04/12/21 10:25 PM  Result Value Ref Range   Acetaminophen (Tylenol), Serum <10 (L) 10 - 30 ug/mL    Comment: (NOTE) Therapeutic concentrations vary significantly. A range of 10-30 ug/mL  may be an effective concentration for many patients. However, some  are best treated at concentrations outside of this range. Acetaminophen concentrations >150 ug/mL at 4 hours after ingestion  and >50 ug/mL at 12 hours after ingestion are often associated with  toxic reactions.  Performed at [facility], [address] [place], [place] [address]   cbc     Status: None   Collection Time: 04/12/21 10:25 PM  Result Value Ref Range   WBC 9.9 4.0 - 10.5 K/uL   RBC 4.34 4.22 - 5.81 MIL/uL   Hemoglobin 14.5 13.0 - 17.0 g/dL   HCT 42.1 39.0 - 52.0 %   MCV 97.0 80.0 - 100.0 fL   MCH 33.4 26.0 - 34.0 pg   MCHC 34.4 30.0 - 36.0  g/dL   RDW 12.3 11.5 - 15.5 %   Platelets 188 150 - 400 K/uL   nRBC 0.0 0.0 - 0.2 %    Comment: Performed at [facility], [address] [place], [place] [address]  Urine Drug Screen, Qualitative     Status: None   Collection Time: 04/12/21 10:25 PM  Result Value Ref Range   Tricyclic,  Ur Screen NONE DETECTED NONE DETECTED   Amphetamines, Ur Screen NONE DETECTED NONE DETECTED   MDMA (Ecstasy)Ur Screen NONE DETECTED NONE DETECTED   Cocaine Metabolite,Ur [place] NONE DETECTED NONE DETECTED   Opiate, Ur Screen NONE DETECTED NONE DETECTED   Phencyclidine (PCP) Ur S NONE DETECTED NONE DETECTED   Cannabinoid 50 Ng, Ur [place] NONE DETECTED NONE DETECTED   Barbiturates, Ur Screen NONE DETECTED NONE DETECTED   Benzodiazepine, Ur Scrn NONE DETECTED NONE DETECTED   Methadone Scn, Ur NONE DETECTED NONE DETECTED    Comment: (NOTE) Tricyclics + metabolites, urine    Cutoff 1000 ng/mL Amphetamines + metabolites, urine  Cutoff 1000 ng/mL MDMA (Ecstasy), urine              Cutoff 500 ng/mL Cocaine Metabolite, urine          Cutoff 300 ng/mL Opiate + metabolites, urine        Cutoff 300 ng/mL Phencyclidine (PCP), urine         Cutoff 25 ng/mL Cannabinoid, urine                 Cutoff 50 ng/mL Barbiturates + metabolites, urine  Cutoff 200 ng/mL Benzodiazepine, urine              Cutoff 200 ng/mL Methadone, urine                   Cutoff 300 ng/mL  The urine drug screen provides only a preliminary, unconfirmed analytical test result and should not be used for non-medical purposes. Clinical consideration and professional judgment should be applied to any positive drug screen result due to possible interfering substances. A more specific alternate chemical method must be used in order to obtain a confirmed analytical result. Gas chromatography / mass spectrometry (GC/MS) is the preferred confirm atory method. Performed at [facility], [address] [place], [place] [address]      Current Facility-Administered Medications  Medication Dose Route Frequency Provider Last Rate Last Admin   LORazepam (ATIVAN) injection 0-4 mg  0-4 mg Intravenous Q6H Paulette Blanch, MD   2 mg at 04/13/21 0054   Or   LORazepam (ATIVAN) tablet 0-4 mg  0-4 mg Oral Q6H Paulette Blanch, MD       thiamine tablet 100 mg  100 mg Oral Daily Paulette Blanch, MD       Or   thiamine (B-1) injection 100 mg  100 mg Intravenous Daily Paulette Blanch, MD       Current Outpatient Medications  Medication Sig Dispense Refill   albuterol (VENTOLIN HFA) 108 (90 Base) MCG/ACT inhaler Inhale 2 puffs into the lungs every 6 (six) hours as needed for wheezing or shortness of breath. 18 g 1   citalopram (CELEXA) 20 MG tablet Take 1 tablet (20 mg total) by mouth daily. 30 tablet 2   folic acid (FOLVITE) 1 MG tablet Take 1 tablet (1 mg total) by mouth daily. (Patient not taking: No sig reported) 30 tablet 3   multivitamin (ONE-A-DAY MEN'S) TABS tablet Take 1 tablet by mouth daily. (Patient not taking: No sig reported) 30 tablet 3   pantoprazole (PROTONIX) 40 MG tablet Take 1 tablet (40 mg total) by mouth daily. (Patient not taking: No sig reported) 30 tablet 2   thiamine 100 MG tablet Take 1 tablet (100 mg total) by mouth daily. (Patient not taking: No sig reported) 30 tablet 3    Musculoskeletal: Strength & Muscle Tone: within normal limits  Gait & Station: normal Patient leans: N/A            Psychiatric Specialty Exam:  Presentation  General Appearance:  No data recorded Eye Contact: No data recorded Speech: No data recorded Speech Volume: No data recorded Handedness: No data recorded  Mood and Affect  Mood: No data recorded Affect: No data recorded  Thought Process  Thought Processes: No data recorded Descriptions of Associations:No data recorded Orientation:No data recorded Thought Content:No data recorded History of Schizophrenia/Schizoaffective disorder:No data recorded Duration of  Psychotic Symptoms:No data recorded Hallucinations:No data recorded Ideas of Reference:No data recorded Suicidal Thoughts:No data recorded Homicidal Thoughts:No data recorded  Sensorium  Memory: No data recorded Judgment: No data recorded Insight: No data recorded  Executive Functions  Concentration: No data recorded Attention Span: No data recorded Recall: No data recorded Fund of Knowledge: No data recorded Language: No data recorded  Psychomotor Activity  Psychomotor Activity: No data recorded  Assets  Assets: No data recorded  Sleep  Sleep: No data recorded  Physical Exam: Physical Exam Vitals and nursing note reviewed.  Constitutional:      Appearance: Normal appearance.  HENT:     Head: Normocephalic and atraumatic.     Mouth/Throat:     Pharynx: Oropharynx is clear.  Eyes:     Pupils: Pupils are equal, round, and reactive to light.  Cardiovascular:     Rate and Rhythm: Normal rate and regular rhythm.  Pulmonary:     Effort: Pulmonary effort is normal.     Breath sounds: Normal breath sounds.  Abdominal:     General: Abdomen is flat.     Palpations: Abdomen is soft.  Musculoskeletal:        General: Normal range of motion.  Skin:    General: Skin is warm and dry.  Neurological:     General: No focal deficit present.     Mental Status: He is alert. Mental status is at baseline.  Psychiatric:        Mood and Affect: Mood normal.        Thought Content: Thought content normal.   Review of Systems  Constitutional: Negative.   HENT: Negative.    Eyes: Negative.   Respiratory: Negative.    Cardiovascular: Negative.   Gastrointestinal: Negative.   Musculoskeletal: Negative.   Skin: Negative.   Neurological: Negative.   Psychiatric/Behavioral:  Positive for depression and substance abuse. Negative for hallucinations, memory loss and suicidal ideas. The patient is not nervous/anxious and does not have insomnia.   Blood pressure 120/78, pulse  80, temperature 98.6 F (37 C), temperature source Oral, resp. rate 19, height 5\' 7"  (1.702 m), weight 93 kg, SpO2 98 %. Body mass index is 32.11 kg/m.  Treatment Plan Summary: Plan patient is now sobering up.  Does not have a history of DTs or seizures.  Does not need inpatient hospitalization.  Denies suicidal ideation.  Patient counseled about the availability of substance abuse treatment through the [place] and encouraged to change this behavior because of his recurrent dangerous acts when he is intoxicated.  Patient reviewed with ER physician.  Discontinued IVC.  Patient can be discharged from the ER.  Follow-up recommended through the [place].  Disposition: Patient does not meet criteria for psychiatric inpatient admission. Supportive therapy provided about ongoing stressors. Discussed crisis plan, support from social network, calling 911, coming to the Emergency Department, and calling Suicide Hotline.  Alethia [place], MD 04/13/2021 3:50 PM

## 2021-04-13 NOTE — ED Notes (Signed)
Kept top paper scrubs for this pt

## 2021-04-21 ENCOUNTER — Other Ambulatory Visit: Payer: Self-pay

## 2021-04-21 DIAGNOSIS — I10 Essential (primary) hypertension: Secondary | ICD-10-CM | POA: Insufficient documentation

## 2021-04-21 DIAGNOSIS — T25122A Burn of first degree of left foot, initial encounter: Secondary | ICD-10-CM | POA: Diagnosis not present

## 2021-04-21 DIAGNOSIS — F17210 Nicotine dependence, cigarettes, uncomplicated: Secondary | ICD-10-CM | POA: Diagnosis not present

## 2021-04-21 DIAGNOSIS — T25221A Burn of second degree of right foot, initial encounter: Secondary | ICD-10-CM | POA: Diagnosis not present

## 2021-04-21 DIAGNOSIS — J449 Chronic obstructive pulmonary disease, unspecified: Secondary | ICD-10-CM | POA: Insufficient documentation

## 2021-04-21 DIAGNOSIS — X19XXXA Contact with other heat and hot substances, initial encounter: Secondary | ICD-10-CM | POA: Insufficient documentation

## 2021-04-21 DIAGNOSIS — T25021A Burn of unspecified degree of right foot, initial encounter: Secondary | ICD-10-CM | POA: Diagnosis present
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
## 2021-04-22 ENCOUNTER — Emergency Department
Admission: EM | Admit: 2021-04-22 | Discharge: 2021-04-22 | Disposition: A | Discharge status: Home / Self Care | Attending: Emergency Medicine | Admitting: Emergency Medicine

## 2021-04-22 DIAGNOSIS — T25221A Burn of second degree of right foot, initial encounter: Secondary | ICD-10-CM

## 2021-04-22 DIAGNOSIS — T25122A Burn of first degree of left foot, initial encounter: Secondary | ICD-10-CM

## 2021-04-22 MED ORDER — OXYCODONE HCL 5 MG PO TABS
5.0000 mg | ORAL_TABLET | Freq: Once | ORAL | Status: AC
  Administered 2021-04-22: 5 mg via ORAL
  Filled 2021-04-22: qty 1

## 2021-04-22 MED ORDER — ACETAMINOPHEN 500 MG PO TABS
1000.0000 mg | ORAL_TABLET | Freq: Once | ORAL | Status: DC
  Filled 2021-04-22: qty 2

## 2021-04-22 MED ORDER — SILVER SULFADIAZINE 1 % EX CREA
TOPICAL_CREAM | Freq: Once | CUTANEOUS | Status: AC
  Administered 2021-04-22: 1 via TOPICAL
  Filled 2021-04-22: qty 85

## 2021-04-22 MED ORDER — SILVER SULFADIAZINE 1 % EX CREA: TOPICAL_CREAM | CUTANEOUS | 1 refills | Status: AC
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
## 2021-04-22 NOTE — ED Notes (Signed)
NT assisted pt to the restroom

## 2021-04-22 NOTE — ED Notes (Signed)
Patient discharged to home per MD order. Patient in stable condition, and deemed medically cleared by ED provider for discharge. Discharge instructions reviewed with patient/family using "Teach Back"; verbalized understanding of medication education and administration, and information about follow-up care. Denies further concerns. ° °

## 2021-04-22 NOTE — ED Notes (Signed)
Silvadene/sterile dressing applied and instructions reviewed with patient.

## 2021-05-13 ENCOUNTER — Encounter: Payer: Self-pay | Admitting: Emergency Medicine

## 2021-05-13 ENCOUNTER — Emergency Department
Admission: EM | Admit: 2021-05-13 | Discharge: 2021-05-13 | Disposition: A | Discharge status: Home / Self Care | Attending: Emergency Medicine | Admitting: Emergency Medicine

## 2021-05-13 ENCOUNTER — Other Ambulatory Visit: Payer: Self-pay

## 2021-05-13 DIAGNOSIS — J449 Chronic obstructive pulmonary disease, unspecified: Secondary | ICD-10-CM | POA: Insufficient documentation

## 2021-05-13 DIAGNOSIS — R451 Restlessness and agitation: Secondary | ICD-10-CM | POA: Diagnosis not present

## 2021-05-13 DIAGNOSIS — F17210 Nicotine dependence, cigarettes, uncomplicated: Secondary | ICD-10-CM | POA: Diagnosis not present

## 2021-05-13 DIAGNOSIS — F4325 Adjustment disorder with mixed disturbance of emotions and conduct: Secondary | ICD-10-CM | POA: Insufficient documentation

## 2021-05-13 DIAGNOSIS — Y908 Blood alcohol level of 240 mg/100 ml or more: Secondary | ICD-10-CM | POA: Diagnosis not present

## 2021-05-13 DIAGNOSIS — F1090 Alcohol use, unspecified, uncomplicated: Secondary | ICD-10-CM | POA: Insufficient documentation

## 2021-05-13 DIAGNOSIS — I10 Essential (primary) hypertension: Secondary | ICD-10-CM | POA: Diagnosis not present

## 2021-05-13 DIAGNOSIS — Z20822 Contact with and (suspected) exposure to covid-19: Secondary | ICD-10-CM | POA: Diagnosis not present

## 2021-05-13 DIAGNOSIS — F1020 Alcohol dependence, uncomplicated: Secondary | ICD-10-CM | POA: Diagnosis present

## 2021-05-13 DIAGNOSIS — Z79899 Other long term (current) drug therapy: Secondary | ICD-10-CM | POA: Diagnosis not present

## 2021-05-13 DIAGNOSIS — F10920 Alcohol use, unspecified with intoxication, uncomplicated: Secondary | ICD-10-CM

## 2021-05-13 DIAGNOSIS — Z046 Encounter for general psychiatric examination, requested by authority: Secondary | ICD-10-CM | POA: Diagnosis present

## 2021-05-13 LAB — ETHANOL: Alcohol, Ethyl (B): 311 mg/dL (ref <10)

## 2021-05-13 LAB — ACETAMINOPHEN LEVEL: Acetaminophen (Tylenol), Serum: <10 ug/mL — ABNORMAL LOW (ref 10–30)

## 2021-05-13 LAB — CBC
HCT: 47.1 % (ref 39.0–52.0)
Hemoglobin: 16.4 g/dL (ref 13.0–17.0)
MCH: 34.2 pg — ABNORMAL HIGH (ref 26.0–34.0)
MCHC: 34.8 g/dL (ref 30.0–36.0)
MCV: 98.1 fL (ref 80.0–100.0)
Platelets: 221 10*3/uL (ref 150–400)
RBC: 4.8 MIL/uL (ref 4.22–5.81)
RDW: 12.4 % (ref 11.5–15.5)
WBC: 8.2 10*3/uL (ref 4.0–10.5)
nRBC: 0 % (ref 0.0–0.2)

## 2021-05-13 LAB — URINE DRUG SCREEN, QUALITATIVE (ARMC ONLY)
Amphetamines, Ur Screen: NOT DETECTED
Barbiturates, Ur Screen: NOT DETECTED
Benzodiazepine, Ur Scrn: NOT DETECTED
Cannabinoid 50 Ng, Ur ~~LOC~~: NOT DETECTED
Cocaine Metabolite,Ur ~~LOC~~: NOT DETECTED
MDMA (Ecstasy)Ur Screen: NOT DETECTED
Methadone Scn, Ur: NOT DETECTED
Opiate, Ur Screen: NOT DETECTED
Phencyclidine (PCP) Ur S: NOT DETECTED
Tricyclic, Ur Screen: NOT DETECTED

## 2021-05-13 LAB — COMPREHENSIVE METABOLIC PANEL
ALT: 41 U/L (ref 0–44)
AST: 36 U/L (ref 15–41)
Albumin: 4.4 g/dL (ref 3.5–5.0)
Alkaline Phosphatase: 84 U/L (ref 38–126)
Anion gap: 9 (ref 5–15)
BUN: 9 mg/dL (ref 6–20)
CO2: 24 mmol/L (ref 22–32)
Calcium: 8.8 mg/dL — ABNORMAL LOW (ref 8.9–10.3)
Chloride: 104 mmol/L (ref 98–111)
Creatinine, Ser: 0.73 mg/dL (ref 0.61–1.24)
GFR, Estimated: >60 mL/min (ref >60)
Glucose, Bld: 118 mg/dL — ABNORMAL HIGH (ref 70–99)
Potassium: 3.6 mmol/L (ref 3.5–5.1)
Sodium: 137 mmol/L (ref 135–145)
Total Bilirubin: 0.6 mg/dL (ref 0.3–1.2)
Total Protein: 8.1 g/dL (ref 6.5–8.1)

## 2021-05-13 LAB — RESP PANEL BY RT-PCR (FLU A&B, COVID) ARPGX2
Influenza A by PCR: NEGATIVE
Influenza B by PCR: NEGATIVE
SARS Coronavirus 2 by RT PCR: NEGATIVE

## 2021-05-13 LAB — SALICYLATE LEVEL: Salicylate Lvl: <7 mg/dL — ABNORMAL LOW (ref 7.0–30.0)

## 2021-05-13 MED ORDER — ZIPRASIDONE MESYLATE 20 MG IM SOLR
20.0000 mg | Freq: Once | INTRAMUSCULAR | Status: AC
  Administered 2021-05-13: 20 mg via INTRAMUSCULAR
  Filled 2021-05-13: qty 20
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
## 2021-05-13 NOTE — ED Notes (Signed)
IVC PENDING  CONSULT ?

## 2021-05-13 NOTE — Consult Note (Signed)
[facility] Face-to-Face Psychiatry Consult   Reason for Consult:  SI in context of alcohol intoxication Referring Physician:  Paduchowski Patient Identification: Jesse Obrien MRN:  [id number] Principal Diagnosis: Alcohol use disorder, moderate, dependence ([place]) Diagnosis:  Principal Problem:   Alcohol use disorder, moderate, dependence ([place])   Total Time spent with patient: 1 hour  Subjective:  "I called 911 because I thought about cutting my throat." Jesse Obrien is a 60 y.o. male patient admitted with SI in the context of alcohol intoxication.   HPI:  On evaluation, patient is still pretty sleepy, but is coherent. He denies any thoughts or intention of suicide. He states "I threaten to kill myself when I am drunk." He denies homicidal ideation. Denies auditory or visual hallucinations. Patient states that he only hears "voices in his head" to kill himself when he is drinking. When asked if he feels alcohol is a problem for him,  he states "maybe." Patient denies any illicit drug use. Patient does not desire or require inpatient psychiatric hospitalization.   Writer called patient's wife, (April Baratta, [phone]) who stated that she is fine with patient returning home. She voices no concern about his or her own safety.    Past Psychiatric History: alcohol use disorder  Risk to Self:   Risk to Others:   Prior Inpatient Therapy:   Prior Outpatient Therapy:    Past Medical History:  Past Medical History:  Diagnosis Date   Alcohol abuse    DJD (degenerative joint disease)    Hypertension 01/02/2015   History reviewed. No pertinent surgical history. Family History: History reviewed. No pertinent family history. Family Psychiatric  History: unknown  Social History:  Social History   Substance and Sexual Activity  Alcohol Use Yes   Comment: 24pk beer 05/13/21     Social History   Substance and Sexual Activity  Drug Use No    Social History   Socioeconomic History    Marital status: Married    Spouse name: Not on file   Number of children: Not on file   Years of education: Not on file   Highest education level: Not on file  Occupational History   Not on file  Tobacco Use   Smoking status: Every Day    Years: 15.00    Types: Cigarettes   Smokeless tobacco: Never  Vaping Use   Vaping Use: Never used  Substance and Sexual Activity   Alcohol use: Yes    Comment: 24pk beer 05/13/21   Drug use: No   Sexual activity: Yes    Birth control/protection: Condom  Other Topics Concern   Not on file  Social History Narrative   Not on file   Social Determinants of Health   Financial Resource Strain: Not on file  Food Insecurity: Not on file  Transportation Needs: Not on file  Physical Activity: Not on file  Stress: Not on file  Social Connections: Not on file   Additional Social History:    Allergies:  No Known Allergies  Labs:  Results for orders placed or performed during the hospital encounter of 05/13/21 (from the past 48 hour(s))  Comprehensive metabolic panel     Status: Abnormal   Collection Time: 05/13/21  7:00 AM  Result Value Ref Range   Sodium 137 135 - 145 mmol/L   Potassium 3.6 3.5 - 5.1 mmol/L   Chloride 104 98 - 111 mmol/L   CO2 24 22 - 32 mmol/L   Glucose, Bld 118 (H) 70 -  99 mg/dL    Comment: Glucose reference range applies only to samples taken after fasting for at least 8 hours.   BUN 9 6 - 20 mg/dL   Creatinine, Ser 0.73 0.61 - 1.24 mg/dL   Calcium 8.8 (L) 8.9 - 10.3 mg/dL   Total Protein 8.1 6.5 - 8.1 g/dL   Albumin 4.4 3.5 - 5.0 g/dL   AST 36 15 - 41 U/L   ALT 41 0 - 44 U/L   Alkaline Phosphatase 84 38 - 126 U/L   Total Bilirubin 0.6 0.3 - 1.2 mg/dL   GFR, Estimated >60 >60 mL/min    Comment: (NOTE) Calculated using the CKD-EPI Creatinine Equation (2021)    Anion gap 9 5 - 15    Comment: Performed at [facility], [address] [place], [place] [address]  Ethanol     Status: Abnormal   Collection  Time: 05/13/21  7:00 AM  Result Value Ref Range   Alcohol, Ethyl (B) 311 (HH) <10 mg/dL    Comment: CRITICAL RESULT CALLED TO, READ BACK BY AND VERIFIED WITH KELLY WILLIAMS AT 0741 05/13/21 DAS (NOTE) Lowest detectable limit for serum alcohol is 10 mg/dL.  For medical purposes only. Performed at [facility], [address] [place], [place] [address]   Salicylate level     Status: Abnormal   Collection Time: 05/13/21  7:00 AM  Result Value Ref Range   Salicylate Lvl <9.5 (L) 7.0 - 30.0 mg/dL    Comment: Performed at [facility], [place] [address], [place] [address]  Acetaminophen level     Status: Abnormal   Collection Time: 05/13/21  7:00 AM  Result Value Ref Range   Acetaminophen (Tylenol), Serum <10 (L) 10 - 30 ug/mL    Comment: (NOTE) Therapeutic concentrations vary significantly. A range of 10-30 ug/mL  may be an effective concentration for many patients. However, some  are best treated at concentrations outside of this range. Acetaminophen concentrations >150 ug/mL at 4 hours after ingestion  and >50 ug/mL at 12 hours after ingestion are often associated with  toxic reactions.  Performed at [facility], [address] [place], [place] [address]   cbc     Status: Abnormal   Collection Time: 05/13/21  7:00 AM  Result Value Ref Range   WBC 8.2 4.0 - 10.5 K/uL   RBC 4.80 4.22 - 5.81 MIL/uL   Hemoglobin 16.4 13.0 - 17.0 g/dL   HCT 47.1 39.0 - 52.0 %   MCV 98.1 80.0 - 100.0 fL   MCH 34.2 (H) 26.0 - 34.0 pg   MCHC 34.8 30.0 - 36.0 g/dL   RDW 12.4 11.5 - 15.5 %   Platelets 221 150 - 400 K/uL   nRBC 0.0 0.0 - 0.2 %    Comment: Performed at [facility], [address] [place],  [address]  Urine Drug Screen, Qualitative     Status: None   Collection Time: 05/13/21  7:00 AM  Result Value Ref Range   Tricyclic, Ur Screen NONE DETECTED NONE DETECTED   Amphetamines, Ur Screen NONE DETECTED NONE DETECTED   MDMA  (Ecstasy)Ur Screen NONE DETECTED NONE DETECTED   Cocaine Metabolite,Ur [place] NONE DETECTED NONE DETECTED   Opiate, Ur Screen NONE DETECTED NONE DETECTED   Phencyclidine (PCP) Ur S NONE DETECTED NONE DETECTED   Cannabinoid 50 Ng, Ur [place] NONE DETECTED NONE DETECTED   Barbiturates, Ur Screen NONE DETECTED NONE DETECTED   Benzodiazepine, Ur Scrn NONE  DETECTED NONE DETECTED   Methadone Scn, Ur NONE DETECTED NONE DETECTED    Comment: (NOTE) Tricyclics + metabolites, urine    Cutoff 1000 ng/mL Amphetamines + metabolites, urine  Cutoff 1000 ng/mL MDMA (Ecstasy), urine              Cutoff 500 ng/mL Cocaine Metabolite, urine          Cutoff 300 ng/mL Opiate + metabolites, urine        Cutoff 300 ng/mL Phencyclidine (PCP), urine         Cutoff 25 ng/mL Cannabinoid, urine                 Cutoff 50 ng/mL Barbiturates + metabolites, urine  Cutoff 200 ng/mL Benzodiazepine, urine              Cutoff 200 ng/mL Methadone, urine                   Cutoff 300 ng/mL  The urine drug screen provides only a preliminary, unconfirmed analytical test result and should not be used for non-medical purposes. Clinical consideration and professional judgment should be applied to any positive drug screen result due to possible interfering substances. A more specific alternate chemical method must be used in order to obtain a confirmed analytical result. Gas chromatography / mass spectrometry (GC/MS) is the preferred confirm atory method. Performed at [facility], [address] [place], [place] [address]     No current facility-administered medications for this encounter.   Current Outpatient Medications  Medication Sig Dispense Refill   albuterol (VENTOLIN HFA) 108 (90 Base) MCG/ACT inhaler Inhale 2 puffs into the lungs every 6 (six) hours as needed for wheezing or shortness of breath. 18 g 1   citalopram (CELEXA) 20 MG tablet Take 1 tablet (20 mg total) by mouth daily. 30 tablet 2   folic acid  (FOLVITE) 1 MG tablet Take 1 tablet (1 mg total) by mouth daily. (Patient not taking: No sig reported) 30 tablet 3   multivitamin (ONE-A-DAY MEN'S) TABS tablet Take 1 tablet by mouth daily. (Patient not taking: No sig reported) 30 tablet 3   pantoprazole (PROTONIX) 40 MG tablet Take 1 tablet (40 mg total) by mouth daily. (Patient not taking: No sig reported) 30 tablet 2   silver sulfADIAZINE (SILVADENE) 1 % cream Apply to affected area daily 50 g 1   thiamine 100 MG tablet Take 1 tablet (100 mg total) by mouth daily. (Patient not taking: No sig reported) 30 tablet 3    Musculoskeletal: Strength & Muscle Tone: within normal limits Gait & Station: normal Patient leans: N/A   Psychiatric Specialty Exam:  Presentation  General Appearance: Disheveled Eye Contact:Fair Speech:Clear and Coherent Speech Volume:No data recorded Handedness:No data recorded  Mood and Affect  Mood:Euthymic Affect:Congruent  Thought Process  Thought Processes:Coherent Descriptions of Associations:Intact Orientation:Full (Time, Place and Person) Thought Content:WDL History of Schizophrenia/Schizoaffective disorder:No data recorded Duration of Psychotic Symptoms:No data recorded Hallucinations:Hallucinations: None Ideas of Reference:None Suicidal Thoughts:Suicidal Thoughts: No Homicidal Thoughts:Homicidal Thoughts: No  Sensorium  Memory:Immediate Good Judgment:Poor Insight:Fair  Executive Functions  Concentration:Fair Attention Span:Fair [place]  Psychomotor Activity  Psychomotor Activity:Psychomotor Activity: Normal  Assets  Assets:Desire for Improvement; Intimacy; Resilience  Sleep  Sleep:Sleep: Fair  Physical Exam: Physical Exam Vitals and nursing note reviewed.  HENT:     Head: Normocephalic.     Nose: No congestion or rhinorrhea.  Eyes:     General:  Right eye: No discharge.        Left eye: No discharge.  Cardiovascular:      Rate and Rhythm: Normal rate.  Pulmonary:     Effort: Pulmonary effort is normal.  Musculoskeletal:        General: Normal range of motion.     Cervical back: Normal range of motion.  Skin:    General: Skin is dry.  Neurological:     Mental Status: He is alert and oriented to person, place, and time.  Psychiatric:        Attention and Perception: Attention normal.        Mood and Affect: Mood normal.        Speech: Speech normal.        Behavior: Behavior normal.        Thought Content: Thought content normal.        Cognition and Memory: Cognition normal.   Review of Systems  Psychiatric/Behavioral:  Positive for substance abuse. Negative for depression.   All other systems reviewed and are negative. Blood pressure (!) 159/149, pulse 100, temperature 98.1 F (36.7 C), temperature source Oral, resp. rate 20, height 5\' 10"  (1.778 m), weight 90.7 kg, SpO2 98 %. Body mass index is 28.7 kg/m.  Treatment Plan Summary: Plan 60 year old male presents to ED after calling 911 while intoxicated, threatening to harm himself. Patient is sober on evaluation and denies suicidal thought or intent. Refer to TTS for outpatient treatment options. Reviewed with EDP   Disposition: No evidence of imminent risk to self or others at present.   Patient does not meet criteria for psychiatric inpatient admission. Supportive therapy provided about ongoing stressors. Discussed crisis plan, support from social network, calling 911, coming to the Emergency Department, and calling Suicide Hotline.  Sherlon Handing, NP 05/13/2021 1:34 PM

## 2021-05-13 NOTE — ED Provider Notes (Signed)
[facility] Emergency Department Provider Note  Time seen: 7:22 AM  I have reviewed the triage vital signs and the nursing notes.   HISTORY  Chief Complaint Suicidal   HPI Jesse Obrien is a 60 y.o. male with a past medical history of alcohol abuse, hypertension, COPD, presents to the emergency department for suicidal ideation.  According to the patient he admits to significant alcohol use overnight until approximately 1 hour ago.  Patient is here stating suicidal ideation.  States he would shoot himself if he had a gun.  Patient denies any medical complaints today.  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 No pertinent surgical history.  Prior to Admission medications   Medication Sig Start Date End Date Taking? Authorizing Provider  albuterol (VENTOLIN HFA) 108 (90 Base) MCG/ACT inhaler Inhale 2 puffs into the lungs every 6 (six) hours as needed for wheezing or shortness of breath. 01/28/21   Clapacs, Madie Reno, MD  citalopram (CELEXA) 20 MG tablet Take 1 tablet (20 mg total) by mouth daily. 06/24/19 06/23/20  Hinda Kehr, MD  folic acid (FOLVITE) 1 MG tablet Take 1 tablet (1 mg  total) by mouth daily. Patient not taking: No sig reported 11/22/20 11/22/21  Ward, Delice Bison, DO  multivitamin (ONE-A-DAY MEN'S) TABS tablet Take 1 tablet by mouth daily. Patient not taking: No sig reported 11/22/20   Ward, Cyril Mourning N, DO  pantoprazole (PROTONIX) 40 MG tablet Take 1 tablet (40 mg total) by mouth daily. Patient not taking: No sig reported 06/24/19   Hinda Kehr, MD  silver sulfADIAZINE (SILVADENE) 1 % cream Apply to affected area daily 04/22/21 04/22/22  Alfred Levins, [place], MD  thiamine 100 MG tablet Take 1 tablet (100 mg total) by mouth daily. Patient not taking: No sig reported 11/22/20   Ward, Cyril Mourning N, DO    No Known Allergies  History reviewed. No pertinent family history.  Social History Social History   Tobacco Use   Smoking status: Every Day    Years: 15.00    Types: Cigarettes   Smokeless tobacco: Never  Vaping Use   Vaping Use: Never used  Substance Use Topics   Alcohol use: Yes    Comment: 24pk beer 05/13/21   Drug use: No    Review of Systems Constitutional: Negative for fever. Cardiovascular: Negative for chest pain. Respiratory: Negative for shortness of breath. Gastrointestinal: Negative for abdominal pain, vomiting  Musculoskeletal: Chronic bursitis Neurological: Negative for headache All other ROS negative  ____________________________________________   PHYSICAL EXAM:  VITAL SIGNS: ED Triage Vitals  Enc Vitals Group     BP 05/13/21 0646 (!) 159/149     Pulse Rate 05/13/21 0646 100     Resp 05/13/21 0646 20     Temp  05/13/21 0646 98.1 F (36.7 C)     Temp Source 05/13/21 0646 Oral     SpO2 05/13/21 0646 98 %     Weight 05/13/21 0646 200 lb (90.7 kg)     Height 05/13/21 0646 5\' 10"  (1.778 m)     Head Circumference --      Peak Flow --      Pain Score 05/13/21 0647 0     Pain Loc --      Pain Edu? --      Excl. in [place]? --    Constitutional: Alert and oriented.  Somewhat slurred speech, consistent with alcohol  intoxication. Eyes: Normal exam ENT      Head: Normocephalic and atraumatic.      Mouth/Throat: Mucous membranes are moist. Cardiovascular: Normal rate, regular rhythm.  Respiratory: Normal respiratory effort without tachypnea nor retractions. Breath sounds are clear  Gastrointestinal: Soft and nontender. No distention. Musculoskeletal: Nontender with normal range of motion in all extremities. Neurologic: Minimally slurred speech.  No gross focal neurologic deficits  Skin:  Skin is warm, dry and intact.  Psychiatric: Mood and affect are normal.   ____________________________________________   INITIAL IMPRESSION / ASSESSMENT AND PLAN / ED COURSE  Pertinent labs & imaging results that were available during my care of the patient were reviewed by me and considered in my medical decision making (see chart for details).   Patient presents to the emergency department for alcohol intoxication and suicidal ideation.  Here patient has slurred speech admits to alcohol intoxication.  Shortly after my examination patient became agitated, yelling slurs and cursing at the security officer as well as nurses.  Patient significantly agitated.  We will dose 20 mg of IM Geodon for staff safety.  Patient has been seen and evaluated by psychiatry.  They believe the patient is safe for discharge home from a psychiatric standpoint and we will resend the IVC.  We will allow the patient to sober in the emergency department and see if he can find a ride home.  Jesse Obrien was evaluated in Emergency Department on 05/13/2021 for the symptoms described in the history of present illness. He was evaluated in the context of the global COVID-19 pandemic, which necessitated consideration that the patient might be at risk for infection with the SARS-CoV-2 virus that causes COVID-19. Institutional protocols and algorithms that pertain to the evaluation of patients at risk for COVID-19 are in a state of rapid change based on  information released by regulatory bodies including the CDC and federal and state organizations. These policies and algorithms were followed during the patient's care in the ED.  ____________________________________________   FINAL CLINICAL IMPRESSION(S) / ED DIAGNOSES  Agitation Alcohol intoxication   Harvest Dark, MD 05/13/21 1321

## 2021-05-13 NOTE — ED Notes (Signed)
Pt breakfast tray sat at bedside

## 2021-05-13 NOTE — ED Notes (Signed)
IVC PAPERS RESCINDED  PER  LOUISE  BARTHOLD  NP  INFORMED  KELLY  RN

## 2021-05-13 NOTE — ED Notes (Signed)
Pt ambulatory to bathroom. While in bathroom pt hit the assist call button and then proceeded to lay himself on the floor. Pt encouraged to get into wheelchair staff provided. Pt stating unable to do so. Pt assisted by this RN and EDT into wheelchair and back to bed. MD made aware.

## 2021-05-13 NOTE — ED Notes (Signed)
Pt given phone to call family for discharge

## 2021-05-13 NOTE — ED Notes (Signed)
Pt given personal belongings. See triage note for list.

## 2021-05-13 NOTE — ED Notes (Signed)
Pt pending discharge due to transportation needs and intoxication

## 2021-05-13 NOTE — ED Notes (Signed)
Pt getting verbally aggressive and yelling slander at security guard stating, "I will beat your ass." Pt also yelling at other pt in hallway. This RN at bedside to deescalate situation and pt rocking back and forth calling this RN a "bitch." MD made aware.

## 2021-05-13 NOTE — ED Notes (Signed)
Pt's wife will be here around 5:30pm

## 2021-10-22 ENCOUNTER — Emergency Department

## 2021-10-22 ENCOUNTER — Encounter: Payer: Self-pay | Admitting: Emergency Medicine

## 2021-10-22 ENCOUNTER — Emergency Department
Admission: EM | Admit: 2021-10-22 | Discharge: 2021-10-22 | Disposition: A | Discharge status: Home / Self Care | Attending: Emergency Medicine | Admitting: Emergency Medicine

## 2021-10-22 DIAGNOSIS — F10129 Alcohol abuse with intoxication, unspecified: Secondary | ICD-10-CM | POA: Insufficient documentation

## 2021-10-22 DIAGNOSIS — G8929 Other chronic pain: Secondary | ICD-10-CM | POA: Diagnosis not present

## 2021-10-22 DIAGNOSIS — E871 Hypo-osmolality and hyponatremia: Secondary | ICD-10-CM | POA: Diagnosis not present

## 2021-10-22 DIAGNOSIS — W19XXXA Unspecified fall, initial encounter: Secondary | ICD-10-CM

## 2021-10-22 DIAGNOSIS — F10920 Alcohol use, unspecified with intoxication, uncomplicated: Secondary | ICD-10-CM

## 2021-10-22 DIAGNOSIS — Z720 Tobacco use: Secondary | ICD-10-CM | POA: Insufficient documentation

## 2021-10-22 DIAGNOSIS — W07XXXA Fall from chair, initial encounter: Secondary | ICD-10-CM | POA: Diagnosis not present

## 2021-10-22 DIAGNOSIS — M25552 Pain in left hip: Secondary | ICD-10-CM | POA: Diagnosis present

## 2021-10-22 DIAGNOSIS — Y908 Blood alcohol level of 240 mg/100 ml or more: Secondary | ICD-10-CM | POA: Diagnosis not present

## 2021-10-22 DIAGNOSIS — I10 Essential (primary) hypertension: Secondary | ICD-10-CM | POA: Diagnosis not present

## 2021-10-22 DIAGNOSIS — J449 Chronic obstructive pulmonary disease, unspecified: Secondary | ICD-10-CM | POA: Diagnosis not present

## 2021-10-22 DIAGNOSIS — Y92009 Unspecified place in unspecified non-institutional (private) residence as the place of occurrence of the external cause: Secondary | ICD-10-CM | POA: Insufficient documentation

## 2021-10-22 LAB — CBC
HCT: 45.1 % (ref 39.0–52.0)
Hemoglobin: 15.1 g/dL (ref 13.0–17.0)
MCH: 32.7 pg (ref 26.0–34.0)
MCHC: 33.5 g/dL (ref 30.0–36.0)
MCV: 97.6 fL (ref 80.0–100.0)
Platelets: 199 10*3/uL (ref 150–400)
RBC: 4.62 MIL/uL (ref 4.22–5.81)
RDW: 12.6 % (ref 11.5–15.5)
WBC: 8.7 10*3/uL (ref 4.0–10.5)
nRBC: 0 % (ref 0.0–0.2)

## 2021-10-22 LAB — COMPREHENSIVE METABOLIC PANEL
ALT: 43 U/L (ref 0–44)
AST: 41 U/L (ref 15–41)
Albumin: 4.1 g/dL (ref 3.5–5.0)
Alkaline Phosphatase: 78 U/L (ref 38–126)
Anion gap: 8 (ref 5–15)
BUN: 6 mg/dL (ref 6–20)
CO2: 24 mmol/L (ref 22–32)
Calcium: 8.3 mg/dL — ABNORMAL LOW (ref 8.9–10.3)
Chloride: 101 mmol/L (ref 98–111)
Creatinine, Ser: 0.77 mg/dL (ref 0.61–1.24)
GFR, Estimated: >60 mL/min (ref >60)
Glucose, Bld: 103 mg/dL — ABNORMAL HIGH (ref 70–99)
Potassium: 3.4 mmol/L — ABNORMAL LOW (ref 3.5–5.1)
Sodium: 133 mmol/L — ABNORMAL LOW (ref 135–145)
Total Bilirubin: 0.6 mg/dL (ref 0.3–1.2)
Total Protein: 7.4 g/dL (ref 6.5–8.1)

## 2021-10-22 LAB — LIPASE, BLOOD: Lipase: 32 U/L (ref 11–51)

## 2021-10-22 LAB — ETHANOL: Alcohol, Ethyl (B): 321 mg/dL (ref <10)

## 2021-10-22 MED ORDER — SODIUM CHLORIDE 0.9 % IV BOLUS
1000.0000 mL | Freq: Once | INTRAVENOUS | Status: AC
  Administered 2021-10-22: 1000 mL via INTRAVENOUS

## 2021-10-22 MED ORDER — OXYCODONE-ACETAMINOPHEN 5-325 MG PO TABS
1.0000 | ORAL_TABLET | Freq: Once | ORAL | Status: AC
  Administered 2021-10-22: 1 via ORAL
  Filled 2021-10-22: qty 1

## 2021-10-22 NOTE — ED Provider Notes (Signed)
? ?[facility] ?Provider Note ? ? ? Event Date/Time  ? First MD Initiated Contact with Patient 10/22/21 0135   ?  (approximate) ? ? ?History  ? ?Fall ? ? ?HPI ? ?[name] is a 61 y.o. male brought to the ED via EMS from home with a chief complaint of alcohol intoxication and fall.  Patient reports he fell out of a chair, striking his head, left hip and lower back.  Endorses chronic left hip pain.  EMS reports patient was able to ambulate to the stretcher.  Patient with large consumption of EtOH tonight.  Denies neck pain, vision changes, chest pain, shortness of breath, abdominal pain, nausea, vomiting or dizziness. ?  ? ? ?Past Medical History  ? ?Past Medical History:  ?Diagnosis Date  ? Alcohol abuse   ? DJD (degenerative joint disease)   ? Hypertension 01/02/2015  ? ? ? ?Active Problem List  ? ?Patient Active Problem List  ? Diagnosis Date Noted  ? 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 Cellulitis of great toe of left foot 02/08/2021  ? COPD (chronic obstructive pulmonary disease) ([place]) 01/28/2021  ? Alcohol intoxication ([place]) 12/20/2020  ? Tobacco use disorder 10/28/2015  ? GERD (gastroesophageal reflux disease) 10/28/2015  ? Alcohol use disorder, moderate, dependence ([place]) 03/20/2015  ? DDD (degenerative disc disease), lumbar 03/20/2015  ? Osteoarthrosis, unspecified whether generalized or localized, involving lower leg 03/20/2015  ? Spinal stenosis of lumbar region 03/20/2015  ? Infected laceration 01/02/2015  ? Hypertension 01/02/2015  ? ? ? ?Past Surgical History  ?History reviewed. No pertinent surgical history. ? ? ?Home Medications  ? ?Prior to Admission medications   ?Medication Sig Start Date End Date Taking? Authorizing Provider  ?albuterol (VENTOLIN HFA) 108 (90 Base) MCG/ACT inhaler Inhale 2 puffs into the lungs every 6 (six) hours as needed for wheezing or shortness of breath. 01/28/21   Clapacs, Madie Reno, MD  ?citalopram (CELEXA) 20  MG tablet Take 1 tablet (20 mg total) by mouth daily. 06/24/19 06/23/20  Hinda Kehr, MD  ?folic acid (FOLVITE) 1 MG tablet Take 1 tablet (1 mg total) by mouth daily. ?Patient not taking: No sig reported 11/22/20 11/22/21  Ward, Delice Bison, DO  ?multivitamin (ONE-A-DAY MEN'S) TABS tablet Take 1 tablet by mouth daily. ?Patient not taking: No sig reported 11/22/20   Ward, Delice Bison, DO  ?pantoprazole (PROTONIX) 40 MG tablet Take 1 tablet (40 mg total) by mouth daily. ?Patient not taking: No sig reported 06/24/19   Hinda Kehr, MD  ?silver sulfADIAZINE (SILVADENE) 1 % cream Apply to affected area daily 04/22/21 04/22/22  Alfred Levins, [place], MD  ?thiamine 100 MG tablet Take 1 tablet (100 mg total) by mouth daily. ?Patient not taking: No sig reported 11/22/20   Ward, Delice Bison, DO  ? ? ? ?Allergies  ?Patient has no known allergies. ? ? ?Family History  ?History reviewed. No pertinent family history. ? ? ?Physical Exam  ?Triage Vital Signs: ?ED Triage Vitals  ?Enc Vitals Group  ?   BP 10/22/21 0134 (!) 145/82  ?   Pulse Rate 10/22/21 0134 99  ?   Resp 10/22/21 0134 20  ?   Temp 10/22/21 0134 97.7 ?F (36.5 ?C)  ?   Temp Source 10/22/21 0134 Oral  ?   SpO2 10/22/21 0134 98 %  ?   Weight 10/22/21 0135 205 lb (93 kg)  ?   Height 10/22/21 0135 '5\' 10"'$  (1.778 m)  ?  Head Circumference --   ?   Peak Flow --   ?   Pain Score 10/22/21 0134 7  ?   Pain Loc --   ?   Pain Edu? --   ?   Excl. in [place]? --   ? ? ?Updated Vital Signs: ?BP 122/88   Pulse 81   Temp 97.7 ?F (36.5 ?C) (Oral)   Resp 18   Ht '5\' 10"'$  (1.778 m)   Wt 93 kg   SpO2 94%   BMI 29.41 kg/m?  ? ? ?General: Awake, no distress.  Intoxicated. ?CV:  RRR.  Good peripheral perfusion.  ?Resp:  Normal effort.  CTA B. ?Abd:  Nontender to light or deep palpation.  No distention.  ?Other:  Head is atraumatic.  Nose atraumatic.  No dental malocclusion.  No cervical spine tenderness to palpation.  No thoracic or lumbar spine tenderness to palpation.  No spinal step-offs or  deformities noted.  Pelvis is stable.  Full range of motion left hip without pain. ? ? ?ED Results / Procedures / Treatments  ?Labs ?(all labs ordered are listed, but only abnormal results are displayed) ?Labs Reviewed  ?COMPREHENSIVE METABOLIC PANEL - Abnormal; Notable for the following components:  ?    Result Value  ? Sodium 133 (*)   ? Potassium 3.4 (*)   ? Glucose, Bld 103 (*)   ? Calcium 8.3 (*)   ? All other components within normal limits  ?ETHANOL - Abnormal; Notable for the following components:  ? Alcohol, Ethyl (B) 321 (*)   ? All other components within normal limits  ?CBC  ?LIPASE, BLOOD  ? ? ? ?EKG ? ?ED ECG REPORT ?I, Paulette Blanch, the attending physician, personally viewed and interpreted this ECG. ? ? Date: 10/22/2021 ? EKG Time: 0136 ? Rate: 93 ? Rhythm: normal sinus rhythm ? Axis: Normal ? Intervals:none ? ST&T Change: Nonspecific ? ? ? ?RADIOLOGY ?I have independently visualized and interpreted patient's imaging studies as well as noted the radiology interpretations: ? ?CT head: No ICH, meningioma ? ?Left hip x-rays: No acute fracture/dislocation ? ?Lumbar spine x-rays: Negative ? ?Official radiology report(s): ?DG Lumbar Spine Complete ? ?Result Date: 10/22/2021 ?CLINICAL DATA:  Fall EXAM: LUMBAR SPINE - COMPLETE 4+ VIEW COMPARISON:  None. FINDINGS: There is no evidence of lumbar spine fracture. Alignment is normal. Multilevel degenerative disc disease. IMPRESSION: Negative. Electronically Signed   By: Ulyses Jarred M.D.   On: 10/22/2021 02:29  ? ?CT Head Wo Contrast ? ?Result Date: 10/22/2021 ?CLINICAL DATA:  Head trauma, abnormal mental status, fall EXAM: CT HEAD WITHOUT CONTRAST TECHNIQUE: Contiguous axial images were obtained from the base of the skull through the vertex without intravenous contrast. RADIATION DOSE REDUCTION: This exam was performed according to the departmental dose-optimization program which includes automated exposure control, adjustment of the mA and/or kV according to  patient size and/or use of iterative reconstruction technique. COMPARISON:  None. FINDINGS: Brain: Normal anatomic configuration. 14 mm densely calcified meningioma is seen at the left vertex abutting the falx. No abnormal intra or extra-axial fluid collection. No abnormal mass effect or midline shift. No evidence of acute intracranial hemorrhage or infarct. Ventricular size is normal. Cerebellum unremarkable. Vascular: Unremarkable Skull: Intact Sinuses/Orbits: Paranasal sinuses are clear. Orbits are unremarkable. Other: Mastoid air cells and middle ear cavities are clear. Remote healed fracture of the left supraorbital ridge with residual depression of the displaced fracture fragments. IMPRESSION: No acute intracranial abnormality.  No calvarial fracture. 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 Remote healed left supraorbital fracture with residual depression deformity. Electronically Signed   By: Fidela Salisbury M.D.   On: 10/22/2021 02:57  ? ?DG Hip Unilat With Pelvis 2-3 Views Left ? ?Result Date: 10/22/2021 ?CLINICAL DATA:  Fall EXAM: DG HIP (WITH OR WITHOUT PELVIS) 2-3V LEFT COMPARISON:  None. FINDINGS: No acute fracture or dislocation. There is bilateral acetabular overgrowth that may contribute to pincer type femoroacetabular impingement. IMPRESSION: No acute fracture or dislocation of the left hip. Electronically Signed   By: Ulyses Jarred M.D.   On: 10/22/2021 02:28   ? ? ?PROCEDURES: ? ?Critical Care performed: No ? ?.1-3 Lead EKG Interpretation ?Performed by: Paulette Blanch, MD ?Authorized by: Paulette Blanch, MD  ? ?  Interpretation: normal   ?  ECG rate:  99 ?  ECG rate assessment: normal   ?  Rhythm: sinus rhythm   ?  Ectopy: none   ?  Conduction: normal   ?Comments:  ?   Patient is on cardiac monitor to evaluate for arrhythmias ? ? ?MEDICATIONS ORDERED IN ED: ?Medications  ?sodium chloride 0.9 % bolus 1,000 mL (0 mLs Intravenous Stopped 10/22/21 0243)  ?oxyCODONE-acetaminophen  (PERCOCET/ROXICET) 5-325 MG per tablet 1 tablet (1 tablet Oral Given 10/22/21 0609)  ? ? ? ?IMPRESSION / MDM / ASSESSMENT AND PLAN / ED COURSE  ?I reviewed the triage vital signs and the nursing notes. ?             ?

## 2021-10-22 NOTE — Discharge Instructions (Signed)
Drink alcohol only in moderation.  Return to the ER for worsening symptoms, persistent vomiting, difficulty breathing or other concerns. 

## 2021-10-22 NOTE — ED Notes (Signed)
Pt ambulated to the restroom without assistance or difficulty.  ?

## 2021-10-22 NOTE — ED Notes (Signed)
Pt to CT

## 2021-10-22 NOTE — ED Notes (Signed)
Pt provided a sandwich box and gingerale. Family friend called to provide a pt a ride. ?

## 2021-10-22 NOTE — ED Triage Notes (Signed)
Pt presents via POV with EMS with complaints of fall out of a chair and endorsing lower back and left hip pain. Pt notes hitting his head after the fall but is unsure of LOC. Pt also has consumed a large number of beers (24pack of natural light) tonight per EMS & his wife. Denies CP.  ?

## 2021-10-22 NOTE — ED Notes (Signed)
Pt placed on 2L [place] due to desatting in his sleep down to 88%. Pt improved to 98%. ?

## 2021-10-27 ENCOUNTER — Emergency Department

## 2021-10-27 ENCOUNTER — Encounter: Payer: Self-pay | Admitting: Emergency Medicine

## 2021-10-27 ENCOUNTER — Emergency Department
Admission: EM | Admit: 2021-10-27 | Discharge: 2021-10-27 | Disposition: A | Discharge status: Home / Self Care | Attending: Emergency Medicine | Admitting: Emergency Medicine

## 2021-10-27 ENCOUNTER — Other Ambulatory Visit: Payer: Self-pay

## 2021-10-27 DIAGNOSIS — M25552 Pain in left hip: Secondary | ICD-10-CM | POA: Insufficient documentation

## 2021-10-27 DIAGNOSIS — E871 Hypo-osmolality and hyponatremia: Secondary | ICD-10-CM | POA: Insufficient documentation

## 2021-10-27 DIAGNOSIS — G8929 Other chronic pain: Secondary | ICD-10-CM | POA: Diagnosis not present

## 2021-10-27 DIAGNOSIS — Y908 Blood alcohol level of 240 mg/100 ml or more: Secondary | ICD-10-CM | POA: Diagnosis not present

## 2021-10-27 DIAGNOSIS — F10120 Alcohol abuse with intoxication, uncomplicated: Secondary | ICD-10-CM | POA: Diagnosis present

## 2021-10-27 DIAGNOSIS — W19XXXA Unspecified fall, initial encounter: Secondary | ICD-10-CM | POA: Insufficient documentation

## 2021-10-27 DIAGNOSIS — F1010 Alcohol abuse, uncomplicated: Secondary | ICD-10-CM

## 2021-10-27 LAB — COMPREHENSIVE METABOLIC PANEL
ALT: 41 U/L (ref 0–44)
AST: 39 U/L (ref 15–41)
Albumin: 4.1 g/dL (ref 3.5–5.0)
Alkaline Phosphatase: 81 U/L (ref 38–126)
Anion gap: 10 (ref 5–15)
BUN: 8 mg/dL (ref 6–20)
CO2: 23 mmol/L (ref 22–32)
Calcium: 8.5 mg/dL — ABNORMAL LOW (ref 8.9–10.3)
Chloride: 101 mmol/L (ref 98–111)
Creatinine, Ser: 0.85 mg/dL (ref 0.61–1.24)
GFR, Estimated: >60 mL/min (ref >60)
Glucose, Bld: 96 mg/dL (ref 70–99)
Potassium: 3.5 mmol/L (ref 3.5–5.1)
Sodium: 134 mmol/L — ABNORMAL LOW (ref 135–145)
Total Bilirubin: 0.6 mg/dL (ref 0.3–1.2)
Total Protein: 7.3 g/dL (ref 6.5–8.1)

## 2021-10-27 LAB — CBC WITH DIFFERENTIAL/PLATELET
Abs Immature Granulocytes: 0.04 10*3/uL (ref 0.00–0.07)
Basophils Absolute: 0 10*3/uL (ref 0.0–0.1)
Basophils Relative: 0 %
Eosinophils Absolute: 0.2 10*3/uL (ref 0.0–0.5)
Eosinophils Relative: 3 %
HCT: 44 % (ref 39.0–52.0)
Hemoglobin: 14.9 g/dL (ref 13.0–17.0)
Immature Granulocytes: 1 %
Lymphocytes Relative: 39 %
Lymphs Abs: 2.9 10*3/uL (ref 0.7–4.0)
MCH: 32.7 pg (ref 26.0–34.0)
MCHC: 33.9 g/dL (ref 30.0–36.0)
MCV: 96.7 fL (ref 80.0–100.0)
Monocytes Absolute: 0.5 10*3/uL (ref 0.1–1.0)
Monocytes Relative: 7 %
Neutro Abs: 3.9 10*3/uL (ref 1.7–7.7)
Neutrophils Relative %: 50 %
Platelets: 173 10*3/uL (ref 150–400)
RBC: 4.55 MIL/uL (ref 4.22–5.81)
RDW: 12.4 % (ref 11.5–15.5)
WBC: 7.6 10*3/uL (ref 4.0–10.5)
nRBC: 0 % (ref 0.0–0.2)

## 2021-10-27 LAB — ETHANOL: Alcohol, Ethyl (B): 267 mg/dL — ABNORMAL HIGH (ref <10)

## 2021-10-27 MED ORDER — OXYCODONE HCL 5 MG PO TABS
5.0000 mg | ORAL_TABLET | Freq: Once | ORAL | Status: AC
  Administered 2021-10-27: 5 mg via ORAL
  Filled 2021-10-27: qty 1

## 2021-10-27 MED ORDER — ACETAMINOPHEN 500 MG PO TABS
1000.0000 mg | ORAL_TABLET | Freq: Once | ORAL | Status: AC
  Administered 2021-10-27: 1000 mg via ORAL
  Filled 2021-10-27: qty 2

## 2021-10-27 NOTE — ED Triage Notes (Signed)
Pt to ED via [facility] for hip pain, and fall r/t ETOH use. Pt states he has chronic hip pain, denies taking OTC meds. Pt states he has been drinking, pt drank 24 beers, causing him to fall. Pt states he drinks every 3 days. Denies hitting head. Takes ASA everyday. Pt has hx of COPD, and arthritis. ? ?Pt is A&Ox4.  ?

## 2021-10-27 NOTE — ED Notes (Signed)
Pt cleaned into new sheets, and gown. Pt given warm blanket for comfort.  ?

## 2021-10-27 NOTE — ED Notes (Addendum)
Pt refusing VS and discharge signature stating "I just want to go the hell home and see my wife and drink a beer and have a cigarette, and I want it now!" ?

## 2021-10-27 NOTE — ED Provider Notes (Signed)
? ?[facility] ?Provider Note ? ? ? Event Date/Time  ? First MD Initiated Contact with Patient 10/27/21 1932   ?  (approximate) ? ? ?History  ? ?Hip Pain and Alcohol Intoxication ? ? ?HPI ? ?Jesse Obrien is a 61 y.o. male who presents to the ED for evaluation of Hip Pain and Alcohol Intoxication ?  ?I reviewed various ED visits for alcoholism and associated complications.  He was seen here 5 days ago for hip pain after a fall. ? ?Patient presents to the ED for evaluation of chronic left hip pain.  He reports he drinks a lot to cover the pain from his chronic left hip.  He reports he fell again this afternoon and so he wanted to get his hip checked out to make sure he did not break it. ? ?Reports he falls every day.  Reports drinking a case of beer every 2 to 3 days.  Denies other coingestions or IVDU. ? ?Physical Exam  ? ?Triage Vital Signs: ?ED Triage Vitals [10/27/21 1947]  ?Enc Vitals Group  ?   BP (!) 139/100  ?   Pulse Rate 95  ?   Resp (!) 21  ?   Temp 98.1 ?F (36.7 ?C)  ?   Temp Source Oral  ?   SpO2 98 %  ?   Weight 205 lb (93 kg)  ?   Height '5\' 10"'$  (1.778 m)  ?   Head Circumference   ?   Peak Flow   ?   Pain Score 7  ?   Pain Loc   ?   Pain Edu?   ?   Excl. in [place]?   ? ? ?Most recent vital signs: ?Vitals:  ? 10/27/21 1947  ?BP: (!) 139/100  ?Pulse: 95  ?Resp: (!) 21  ?Temp: 98.1 ?F (36.7 ?C)  ?SpO2: 98%  ? ? ?General: Awake, no distress.  Obviously intoxicated.  Flexing both of his knees and hips, rocking back and forth in bed and requesting pain medication. ?CV:  Good peripheral perfusion.  ?Resp:  Normal effort.  ?Abd:  No distention.  ?MSK:  No deformity noted.  No signs of trauma. ?Neuro:  No focal deficits appreciated. Cranial nerves II through XII intact ?5/5 strength and sensation in all 4 extremities ?Other:   ? ? ?ED Results / Procedures / Treatments  ? ?Labs ?(all labs ordered are listed, but only abnormal results are displayed) ?Labs Reviewed  ?COMPREHENSIVE METABOLIC PANEL  - Abnormal; Notable for the following components:  ?    Result Value  ? Sodium 134 (*)   ? Calcium 8.5 (*)   ? All other components within normal limits  ?ETHANOL - Abnormal; Notable for the following components:  ? Alcohol, Ethyl (B) 267 (*)   ? All other components within normal limits  ?CBC WITH DIFFERENTIAL/PLATELET  ? ? ?EKG ? ? ?RADIOLOGY ?2 view CXR and plain film of the pelvis reviewed by me without evidence of acute cardiopulmonary pathology or pelvic fracture. ?CT head reviewed by me without evidence of acute intracranial pathology ? ?Official radiology report(s): ?DG Chest 2 View ? ?Result Date: 10/27/2021 ?CLINICAL DATA:  Cough EXAM: CHEST - 2 VIEW COMPARISON:  02/08/2021 FINDINGS: The heart size and mediastinal contours are within normal limits. Both lungs are clear. The visualized skeletal structures are unremarkable. IMPRESSION: Negative. Electronically Signed   By: Rolm Baptise M.D.   On: 10/27/2021 21:02  ? ?CT HEAD WO CONTRAST (5MM) ? ?Result Date:  10/27/2021 ?CLINICAL DATA:  Status post fall. EXAM: CT HEAD WITHOUT CONTRAST CT CERVICAL SPINE WITHOUT CONTRAST TECHNIQUE: Multidetector CT imaging of the head and cervical spine was performed following the standard protocol without intravenous contrast. Multiplanar CT image reconstructions of the cervical spine were also generated. RADIATION DOSE REDUCTION: This exam was performed according to the departmental dose-optimization program which includes automated exposure control, adjustment of the mA and/or kV according to patient size and/or use of iterative reconstruction technique. COMPARISON:  CT head 10/22/2021 FINDINGS: CT HEAD FINDINGS Brain: No evidence of acute infarction, hemorrhage, hydrocephalus, extra-axial collection or mass lesion/mass effect. 1.3 cm calcified meningioma is seen at the left vertex abutting the falx, image 25/5. Unchanged. Vascular: No hyperdense vessel or unexpected calcification. Skull: Normal. Negative for fracture or focal  lesion. Sinuses/Orbits: The paranasal sinuses and mastoid air cells are clear. Other: None. CT CERVICAL SPINE FINDINGS Alignment: Normal. Skull base and vertebrae: No acute fracture. No primary bone lesion or focal pathologic process. Soft tissues and spinal canal: No prevertebral fluid or swelling. No visible canal hematoma. Disc levels: Multilevel disc space narrowing and endplate spurring is identified at C4-5 through C7-T1. Most advanced at C6-7. Upper chest: Negative. Other: None IMPRESSION: 1. No evidence for acute intracranial abnormality. 2. Stable calcified meningioma at the left vertex. 3. No evidence for cervical spine fracture or subluxation. 4. Cervical degenerative disc disease. Electronically Signed   By: Kerby Moors M.D.   On: 10/27/2021 20:57  ? ?CT Cervical Spine Wo Contrast ? ?Result Date: 10/27/2021 ?CLINICAL DATA:  Status post fall. EXAM: CT HEAD WITHOUT CONTRAST CT CERVICAL SPINE WITHOUT CONTRAST TECHNIQUE: Multidetector CT imaging of the head and cervical spine was performed following the standard protocol without intravenous contrast. Multiplanar CT image reconstructions of the cervical spine were also generated. RADIATION DOSE REDUCTION: This exam was performed according to the departmental dose-optimization program which includes automated exposure control, adjustment of the mA and/or kV according to patient size and/or use of iterative reconstruction technique. COMPARISON:  CT head 10/22/2021 FINDINGS: CT HEAD FINDINGS Brain: No evidence of acute infarction, hemorrhage, hydrocephalus, extra-axial collection or mass lesion/mass effect. 1.3 cm calcified meningioma is seen at the left vertex abutting the falx, image 25/5. Unchanged. Vascular: No hyperdense vessel or unexpected calcification. Skull: Normal. Negative for fracture or focal lesion. Sinuses/Orbits: The paranasal sinuses and mastoid air cells are clear. Other: None. CT CERVICAL SPINE FINDINGS Alignment: Normal. Skull base and  vertebrae: No acute fracture. No primary bone lesion or focal pathologic process. Soft tissues and spinal canal: No prevertebral fluid or swelling. No visible canal hematoma. Disc levels: Multilevel disc space narrowing and endplate spurring is identified at C4-5 through C7-T1. Most advanced at C6-7. Upper chest: Negative. Other: None IMPRESSION: 1. No evidence for acute intracranial abnormality. 2. Stable calcified meningioma at the left vertex. 3. No evidence for cervical spine fracture or subluxation. 4. Cervical degenerative disc disease. Electronically Signed   By: Kerby Moors M.D.   On: 10/27/2021 20:57  ? ?DG HIP UNILAT WITH PELVIS 2-3 VIEWS LEFT ? ?Result Date: 10/27/2021 ?CLINICAL DATA:  Left hip pain EXAM: DG HIP (WITH OR WITHOUT PELVIS) 2-3V LEFT COMPARISON:  10/22/2021 FINDINGS: Degenerative changes in the hips with joint space narrowing and spurring. SI joints symmetric and unremarkable. No acute bony abnormality. Specifically, no fracture, subluxation, or dislocation. IMPRESSION: Degenerative changes in the hips.  No acute bony abnormality. Electronically Signed   By: Rolm Baptise M.D.   On: 10/27/2021 21:09   ? ?PROCEDURES  and INTERVENTIONS: ? ?.1-3 Lead EKG Interpretation ?Performed by: Vladimir Crofts, MD ?Authorized by: Vladimir Crofts, MD  ? ?  Interpretation: normal   ?  ECG rate:  90 ?  ECG rate assessment: normal   ?  Rhythm: sinus rhythm   ?  Ectopy: none   ?  Conduction: normal   ? ?Medications  ?acetaminophen (TYLENOL) tablet 1,000 mg (1,000 mg Oral Given 10/27/21 2109)  ?oxyCODONE (Oxy IR/ROXICODONE) immediate release tablet 5 mg (5 mg Oral Given 10/27/21 2109)  ? ? ? ?IMPRESSION / MDM / ASSESSMENT AND PLAN / ED COURSE  ?I reviewed the triage vital signs and the nursing notes. ? ?Chronic alcoholic presents to the ED with chronic left hip pain without acute pathology and suitable for outpatient management.  He is intoxicated, somewhat belligerent but calmed down after being observed for about 3  hours in the ED.  Minimal hyponatremia is noted, normal CBC.  Plain films of his chest and left hip reviewed by me without evidence of any injuries or infection/aspiration.  CT head and neck pathology.Do

## 2021-11-09 ENCOUNTER — Emergency Department
Admission: EM | Admit: 2021-11-09 | Discharge: 2021-11-09 | Disposition: A | Discharge status: Home / Self Care | Attending: Emergency Medicine | Admitting: Emergency Medicine

## 2021-11-09 ENCOUNTER — Other Ambulatory Visit: Payer: Self-pay

## 2021-11-09 DIAGNOSIS — F10920 Alcohol use, unspecified with intoxication, uncomplicated: Secondary | ICD-10-CM

## 2021-11-09 DIAGNOSIS — I10 Essential (primary) hypertension: Secondary | ICD-10-CM | POA: Diagnosis not present

## 2021-11-09 DIAGNOSIS — F1010 Alcohol abuse, uncomplicated: Secondary | ICD-10-CM

## 2021-11-09 DIAGNOSIS — Z046 Encounter for general psychiatric examination, requested by authority: Secondary | ICD-10-CM | POA: Diagnosis present

## 2021-11-09 DIAGNOSIS — Y908 Blood alcohol level of 240 mg/100 ml or more: Secondary | ICD-10-CM | POA: Diagnosis not present

## 2021-11-09 DIAGNOSIS — F17210 Nicotine dependence, cigarettes, uncomplicated: Secondary | ICD-10-CM | POA: Diagnosis not present

## 2021-11-09 DIAGNOSIS — R45851 Suicidal ideations: Secondary | ICD-10-CM | POA: Diagnosis not present

## 2021-11-09 DIAGNOSIS — F10120 Alcohol abuse with intoxication, uncomplicated: Secondary | ICD-10-CM | POA: Insufficient documentation

## 2021-11-09 DIAGNOSIS — F10129 Alcohol abuse with intoxication, unspecified: Secondary | ICD-10-CM | POA: Diagnosis present

## 2021-11-09 DIAGNOSIS — F10929 Alcohol use, unspecified with intoxication, unspecified: Secondary | ICD-10-CM | POA: Diagnosis present

## 2021-11-09 LAB — COMPREHENSIVE METABOLIC PANEL
ALT: 31 U/L (ref 0–44)
AST: 29 U/L (ref 15–41)
Albumin: 4.2 g/dL (ref 3.5–5.0)
Alkaline Phosphatase: 79 U/L (ref 38–126)
Anion gap: 11 (ref 5–15)
BUN: 6 mg/dL (ref 6–20)
CO2: 20 mmol/L — ABNORMAL LOW (ref 22–32)
Calcium: 8.6 mg/dL — ABNORMAL LOW (ref 8.9–10.3)
Chloride: 105 mmol/L (ref 98–111)
Creatinine, Ser: 0.8 mg/dL (ref 0.61–1.24)
GFR, Estimated: >60 mL/min (ref >60)
Glucose, Bld: 101 mg/dL — ABNORMAL HIGH (ref 70–99)
Potassium: 3.9 mmol/L (ref 3.5–5.1)
Sodium: 136 mmol/L (ref 135–145)
Total Bilirubin: 0.6 mg/dL (ref 0.3–1.2)
Total Protein: 7.9 g/dL (ref 6.5–8.1)

## 2021-11-09 LAB — CBC WITH DIFFERENTIAL/PLATELET
Abs Immature Granulocytes: 0.08 10*3/uL — ABNORMAL HIGH (ref 0.00–0.07)
Basophils Absolute: 0.1 10*3/uL (ref 0.0–0.1)
Basophils Relative: 1 %
Eosinophils Absolute: 0.4 10*3/uL (ref 0.0–0.5)
Eosinophils Relative: 4 %
HCT: 48.1 % (ref 39.0–52.0)
Hemoglobin: 16.1 g/dL (ref 13.0–17.0)
Immature Granulocytes: 1 %
Lymphocytes Relative: 42 %
Lymphs Abs: 3.9 10*3/uL (ref 0.7–4.0)
MCH: 32.2 pg (ref 26.0–34.0)
MCHC: 33.5 g/dL (ref 30.0–36.0)
MCV: 96.2 fL (ref 80.0–100.0)
Monocytes Absolute: 0.8 10*3/uL (ref 0.1–1.0)
Monocytes Relative: 8 %
Neutro Abs: 4.1 10*3/uL (ref 1.7–7.7)
Neutrophils Relative %: 44 %
Platelets: 195 10*3/uL (ref 150–400)
RBC: 5 MIL/uL (ref 4.22–5.81)
RDW: 12.5 % (ref 11.5–15.5)
WBC: 9.3 10*3/uL (ref 4.0–10.5)
nRBC: 0 % (ref 0.0–0.2)

## 2021-11-09 LAB — URINE DRUG SCREEN, QUALITATIVE (ARMC ONLY)
Amphetamines, Ur Screen: NOT DETECTED
Barbiturates, Ur Screen: NOT DETECTED
Benzodiazepine, Ur Scrn: NOT DETECTED
Cannabinoid 50 Ng, Ur ~~LOC~~: NOT DETECTED
Cocaine Metabolite,Ur ~~LOC~~: NOT DETECTED
MDMA (Ecstasy)Ur Screen: NOT DETECTED
Methadone Scn, Ur: NOT DETECTED
Opiate, Ur Screen: NOT DETECTED
Phencyclidine (PCP) Ur S: NOT DETECTED
Tricyclic, Ur Screen: NOT DETECTED

## 2021-11-09 LAB — ETHANOL: Alcohol, Ethyl (B): 326 mg/dL (ref <10)

## 2021-11-09 MED ORDER — ZIPRASIDONE MESYLATE 20 MG IM SOLR
20.0000 mg | Freq: Once | INTRAMUSCULAR | Status: DC
  Filled 2021-11-09: qty 20

## 2021-11-09 NOTE — ED Triage Notes (Signed)
Patient arrived from home with [employer] with IVC paperwork. Patient states he drank and 24 pk of beer cause his back and hips are hurting. Patient denies SI/HI/AVH. IVC paperwork state, pt called 911 and stated he wanted to kill himself with knife. Officers arrived and patient continued to state he wanted to kill himself and was going to use knife.  Patient dressed out by this Probation officer and ED tech [place]. Belonging placed in bag and labeled.  ? ?1- jeans ?1- tshirt ?1-pair box shorts ? ? ?

## 2021-11-09 NOTE — ED Notes (Signed)
Pt threw breakfast tray into hall.   ?

## 2021-11-09 NOTE — ED Notes (Signed)
Pt given D/C instructions and clothing to dress for discharge.  Pt also given phone to call his wife for a ride.  ?

## 2021-11-09 NOTE — ED Notes (Addendum)
Patient resting in room. No noted distress or abnormal behaviors noted. Will continue 15 minute checks. ?

## 2021-11-09 NOTE — Discharge Instructions (Signed)
Please seek medical attention and help for any thoughts about wanting to harm yourself, harm others, any concerning change in behavior, severe depression, inappropriate drug use or any other new or concerning symptoms. ° °
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
## 2021-11-09 NOTE — ED Notes (Signed)
Pt can be heard from nurse's station.  "I'm ready to go home!!"   ?

## 2021-11-09 NOTE — ED Notes (Signed)
Pt discharged home.  All belongings returned to patient.  Pt denies SI.   ?
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
## 2021-11-09 NOTE — ED Notes (Signed)
After patient's outburst he returned to his bed and laid down. Will hold medication at this time.  ?
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
## 2021-11-09 NOTE — Consult Note (Signed)
Patient has BAL of 326 @ 0625. Patient had become belligerent with nursing staff about 1020. Writer went to room about 1030, where patient was laying on his bed. Patient reports to this writer that "I did not threaten to kill myself, I was drunk. I am  not suicidal." Writer advised patient that he needs to stay here to sober up. Patient agreed. Due to patient's unpredictable behavior, will re-assess when he is more coherent.   Sherlon Handing, PMHNP-BC

## 2021-11-09 NOTE — ED Notes (Signed)
Pt walked out of room and headed to lobby.  Refused VS and signature.  ?

## 2021-11-09 NOTE — Consult Note (Signed)
[facility] Face-to-Face Psychiatry Consult   Reason for Consult:  alcohol intoxication/expression of SI Referring Physician:  EDP Patient Identification: Jesse Obrien MRN:  [id number] Principal Diagnosis: Alcohol intoxication ([place]) Diagnosis:  Principal Problem:   Alcohol intoxication ([place])   Total Time spent with patient: 45 minutes  Subjective: "I drink for pain management." Jesse Obrien is a 61 y.o. male patient admitted with SI in the context of alcohol intoxication.  HPI:  Patient seen face-to-face after he slept for several hours. Appears to be sober at this point. Patient denies suicidal thoughts or intent at this time. He states he remembers calling the police and threatening to kill himself. He reports that he was drinking because his hips are bothering him so much.  He states that he does not feel that he has a problem with alcohol but he drinks for pain management.  He reports that he is working with his primary care physician to get referral for hip replacement.  Patient adamantly denies any suicidal thoughts or intent at this time.  Denies homicidal ideation.  Denies paranoia, auditory or visual hallucinations.  He speaks in clear, coherent, linear sentences.  He does not appear to be a danger to himself or anyone else at this time.  He is requesting discharge.  IVC released.  Past Psychiatric History: alcohol misuse  Risk to Self:   Risk to Others:   Prior Inpatient Therapy:   Prior Outpatient Therapy:    Past Medical History:  Past Medical History:  Diagnosis Date   Alcohol abuse    DJD (degenerative joint disease)    Hypertension 01/02/2015   History reviewed. No pertinent surgical history. Family History: History reviewed. No pertinent family history. Family Psychiatric  History:  Social History:  Social History   Substance and Sexual Activity  Alcohol Use Yes   Comment: 24pk beer     Social History   Substance and Sexual Activity  Drug Use No    Social  History   Socioeconomic History   Marital status: Married    Spouse name: Not on file   Number of children: Not on file   Years of education: Not on file   Highest education level: Not on file  Occupational History   Not on file  Tobacco Use   Smoking status: Every Day    Years: 15.00    Types: Cigarettes   Smokeless tobacco: Never  Vaping Use   Vaping Use: Never used  Substance and Sexual Activity   Alcohol use: Yes    Comment: 24pk beer   Drug use: No   Sexual activity: Yes    Birth control/protection: Condom  Other Topics Concern   Not on file  Social History Narrative   Not on file   Social Determinants of Health   Financial Resource Strain: Not on file  Food Insecurity: Not on file  Transportation Needs: Not on file  Physical Activity: Not on file  Stress: Not on file  Social Connections: Not on file   Additional Social History:    Allergies:  No Known Allergies  Labs:  Results for orders placed or performed during the hospital encounter of 11/09/21 (from the past 48 hour(s))  CBC with Differential     Status: Abnormal   Collection Time: 11/09/21  6:25 AM  Result Value Ref Range   WBC 9.3 4.0 - 10.5 K/uL   RBC 5.00 4.22 - 5.81 MIL/uL   Hemoglobin 16.1 13.0 - 17.0 g/dL   HCT 48.1 39.0 -  52.0 %   MCV 96.2 80.0 - 100.0 fL   MCH 32.2 26.0 - 34.0 pg   MCHC 33.5 30.0 - 36.0 g/dL   RDW 12.5 11.5 - 15.5 %   Platelets 195 150 - 400 K/uL   nRBC 0.0 0.0 - 0.2 %   Neutrophils Relative % 44 %   Neutro Abs 4.1 1.7 - 7.7 K/uL   Lymphocytes Relative 42 %   Lymphs Abs 3.9 0.7 - 4.0 K/uL   Monocytes Relative 8 %   Monocytes Absolute 0.8 0.1 - 1.0 K/uL   Eosinophils Relative 4 %   Eosinophils Absolute 0.4 0.0 - 0.5 K/uL   Basophils Relative 1 %   Basophils Absolute 0.1 0.0 - 0.1 K/uL   Immature Granulocytes 1 %   Abs Immature Granulocytes 0.08 (H) 0.00 - 0.07 K/uL    Comment: Performed at [facility], [address] [place], [place] [address]   Comprehensive metabolic panel     Status: Abnormal   Collection Time: 11/09/21  6:25 AM  Result Value Ref Range   Sodium 136 135 - 145 mmol/L   Potassium 3.9 3.5 - 5.1 mmol/L   Chloride 105 98 - 111 mmol/L   CO2 20 (L) 22 - 32 mmol/L   Glucose, Bld 101 (H) 70 - 99 mg/dL    Comment: Glucose reference range applies only to samples taken after fasting for at least 8 hours.   BUN 6 6 - 20 mg/dL   Creatinine, Ser 0.80 0.61 - 1.24 mg/dL   Calcium 8.6 (L) 8.9 - 10.3 mg/dL   Total Protein 7.9 6.5 - 8.1 g/dL   Albumin 4.2 3.5 - 5.0 g/dL   AST 29 15 - 41 U/L   ALT 31 0 - 44 U/L   Alkaline Phosphatase 79 38 - 126 U/L   Total Bilirubin 0.6 0.3 - 1.2 mg/dL   GFR, Estimated >60 >60 mL/min    Comment: (NOTE) Calculated using the CKD-EPI Creatinine Equation (2021)    Anion gap 11 5 - 15    Comment: Performed at [facility], [address] [place], [place] [address]  Ethanol     Status: Abnormal   Collection Time: 11/09/21  6:25 AM  Result Value Ref Range   Alcohol, Ethyl (B) 326 (HH) <10 mg/dL    Comment: CRITICAL RESULT CALLED TO, READ BACK BY AND VERIFIED WITH KIMBERLY SUMMERS'@0657'$  ON 11/09/21 BY HKP (NOTE) Lowest detectable limit for serum alcohol is 10 mg/dL.  For medical purposes only. Performed at [facility], [address] [place], [place] [address]   Urine Drug Screen, Qualitative [facility] only)     Status: None   Collection Time: 11/09/21  6:57 AM  Result Value Ref Range   Tricyclic, Ur Screen NONE DETECTED NONE DETECTED   Amphetamines, Ur Screen NONE DETECTED NONE DETECTED   MDMA (Ecstasy)Ur Screen NONE DETECTED NONE DETECTED   Cocaine Metabolite,Ur [place] NONE DETECTED NONE DETECTED   Opiate, Ur Screen NONE DETECTED NONE DETECTED   Phencyclidine (PCP) Ur S NONE DETECTED NONE DETECTED   Cannabinoid 50 Ng, Ur [place] NONE DETECTED NONE DETECTED   Barbiturates, Ur Screen NONE DETECTED NONE DETECTED   Benzodiazepine, Ur Scrn NONE DETECTED NONE DETECTED    Methadone Scn, Ur NONE DETECTED NONE DETECTED    Comment: (NOTE) Tricyclics + metabolites, urine    Cutoff 1000 ng/mL Amphetamines + metabolites, urine  Cutoff 1000 ng/mL MDMA (Ecstasy), urine  Cutoff 500 ng/mL Cocaine Metabolite, urine          Cutoff 300 ng/mL Opiate + metabolites, urine        Cutoff 300 ng/mL Phencyclidine (PCP), urine         Cutoff 25 ng/mL Cannabinoid, urine                 Cutoff 50 ng/mL Barbiturates + metabolites, urine  Cutoff 200 ng/mL Benzodiazepine, urine              Cutoff 200 ng/mL Methadone, urine                   Cutoff 300 ng/mL  The urine drug screen provides only a preliminary, unconfirmed analytical test result and should not be used for non-medical purposes. Clinical consideration and professional judgment should be applied to any positive drug screen result due to possible interfering substances. A more specific alternate chemical method must be used in order to obtain a confirmed analytical result. Gas chromatography / mass spectrometry (GC/MS) is the preferred confirm atory method. Performed at [facility], [address], [place],  [address]     Current Facility-Administered Medications  Medication Dose Route Frequency Provider Last Rate Last Admin   ziprasidone (GEODON) injection 20 mg  20 mg Intramuscular Once Lavonia Drafts, MD       Current Outpatient Medications  Medication Sig Dispense Refill   albuterol (VENTOLIN HFA) 108 (90 Base) MCG/ACT inhaler Inhale 2 puffs into the lungs every 6 (six) hours as needed for wheezing or shortness of breath. 18 g 1   citalopram (CELEXA) 20 MG tablet Take 1 tablet (20 mg total) by mouth daily. 30 tablet 2   folic acid (FOLVITE) 1 MG tablet Take 1 tablet (1 mg total) by mouth daily. (Patient not taking: Reported on 04/12/2021) 30 tablet 3   multivitamin (ONE-A-DAY MEN'S) TABS tablet Take 1 tablet by mouth daily. (Patient not taking: Reported on 04/12/2021) 30 tablet 3    pantoprazole (PROTONIX) 40 MG tablet Take 1 tablet (40 mg total) by mouth daily. (Patient not taking: Reported on 04/12/2021) 30 tablet 2   silver sulfADIAZINE (SILVADENE) 1 % cream Apply to affected area daily 50 g 1   thiamine 100 MG tablet Take 1 tablet (100 mg total) by mouth daily. (Patient not taking: Reported on 04/12/2021) 30 tablet 3    Musculoskeletal: Strength & Muscle Tone: within normal limits Gait & Station: normal Patient leans: N/A   Psychiatric Specialty Exam:  Presentation  General Appearance: Appropriate for Environment  Eye Contact:Good  Speech:Clear and Coherent  Speech Volume:Normal  Handedness:No data recorded  Mood and Affect  Mood:Euthymic  Affect:Congruent   Thought Process  Thought Processes:Coherent  Descriptions of Associations:Intact  Orientation:Full (Time, Place and Person)  Thought Content:Logical  History of Schizophrenia/Schizoaffective disorder:No data recorded Duration of Psychotic Symptoms:No data recorded Hallucinations:Hallucinations: None  Ideas of Reference:None  Suicidal Thoughts:Suicidal Thoughts: No  Homicidal Thoughts:Homicidal Thoughts: No   Sensorium  Memory:Immediate Good; Recent Good  Judgment:Fair  Insight:Fair   Executive Functions  Concentration:Good  Attention Span:Good  [place] of Knowledge:Good  Language:Good   Psychomotor Activity  Psychomotor Activity:Psychomotor Activity: Normal   Assets  Assets:Communication Skills; Desire for Improvement; Financial Resources/Insurance; Housing; Resilience; Physical Health   Sleep  Sleep:Sleep: Good   Physical Exam: Physical Exam ROS Blood pressure 112/78, pulse 88, temperature 98.4 F (36.9 C), temperature source Oral, resp. rate 16, height '5\' 10"'$  (1.778 m), weight 93 kg, SpO2  96 %. Body mass index is 29.42 kg/m.  Treatment Plan Summary: Plan Patient states he has no interest in substance misuse services, as he states he  is using alcohol for pain management. He is working with his PCP to get hip replacement.  Reviewed with EDP  Disposition: No evidence of imminent risk to self or others at present.   Patient does not meet criteria for psychiatric inpatient admission. Discussed crisis plan, support from social network, calling 911, coming to the Emergency Department, and calling Suicide Hotline.  Sherlon Handing, NP 11/09/2021 6:22 PM

## 2021-11-21 ENCOUNTER — Other Ambulatory Visit: Payer: Self-pay

## 2021-11-21 ENCOUNTER — Emergency Department

## 2021-11-21 ENCOUNTER — Emergency Department
Admission: EM | Admit: 2021-11-21 | Discharge: 2021-11-21 | Disposition: A | Discharge status: Home / Self Care | Attending: Emergency Medicine | Admitting: Emergency Medicine

## 2021-11-21 DIAGNOSIS — F1020 Alcohol dependence, uncomplicated: Secondary | ICD-10-CM | POA: Diagnosis present

## 2021-11-21 DIAGNOSIS — I10 Essential (primary) hypertension: Secondary | ICD-10-CM | POA: Insufficient documentation

## 2021-11-21 DIAGNOSIS — M5441 Lumbago with sciatica, right side: Secondary | ICD-10-CM | POA: Diagnosis not present

## 2021-11-21 DIAGNOSIS — M25551 Pain in right hip: Secondary | ICD-10-CM | POA: Diagnosis present

## 2021-11-21 DIAGNOSIS — M160 Bilateral primary osteoarthritis of hip: Secondary | ICD-10-CM | POA: Diagnosis not present

## 2021-11-21 DIAGNOSIS — F10920 Alcohol use, unspecified with intoxication, uncomplicated: Secondary | ICD-10-CM | POA: Diagnosis not present

## 2021-11-21 DIAGNOSIS — G8929 Other chronic pain: Secondary | ICD-10-CM | POA: Diagnosis not present

## 2021-11-21 DIAGNOSIS — M5442 Lumbago with sciatica, left side: Secondary | ICD-10-CM | POA: Diagnosis not present

## 2021-11-21 DIAGNOSIS — F10929 Alcohol use, unspecified with intoxication, unspecified: Secondary | ICD-10-CM | POA: Diagnosis present

## 2021-11-21 DIAGNOSIS — F10129 Alcohol abuse with intoxication, unspecified: Secondary | ICD-10-CM | POA: Diagnosis present

## 2021-11-21 MED ORDER — METHYLPREDNISOLONE 4 MG PO TBPK: ORAL_TABLET | ORAL | 0 refills | Status: DC

## 2021-11-21 MED ORDER — DEXAMETHASONE SODIUM PHOSPHATE 10 MG/ML IJ SOLN
10.0000 mg | Freq: Once | INTRAMUSCULAR | Status: AC
  Administered 2021-11-21: 10 mg via INTRAVENOUS
  Filled 2021-11-21: qty 1

## 2021-11-21 NOTE — ED Notes (Signed)
Pt requesting to speak to psych to be "placed back on my medicine". MD notified per pt request.

## 2021-11-21 NOTE — ED Notes (Signed)
Pt to CT

## 2021-11-21 NOTE — Consult Note (Signed)
[facility] Face-to-Face Psychiatry Consult   Reason for Consult:  alcohol intoxication Referring Physician:  EDP Patient Identification: Jesse Obrien MRN:  [id number] Principal Diagnosis: Alcohol intoxication Diagnosis:  Active Problems:   Alcohol use disorder, moderate, dependence ([place])   Alcohol intoxication ([name])   Total Time spent with patient: 45 minutes  Subjective:   Jesse Obrien is a 61 y.o. male patient admitted with alcohol intoxication and hip pain.  HPI:  61 yo male presented to the ED with bilateral hip pain and self-medicating with alcohol.  On assessment, he does not want alcohol detox or rehab.  He receives his care via the [place] and mentioned earlier today wanting to restart his medications.  On the psych end of it, he is only on Celexa, encouraged him to contact the [facility] to restart as someone needs to be following him.  Denies detox symptoms, no history of withdrawal seizures.  Psychiatrically cleared.  Past Psychiatric History: alcohol abuse, depression, anxiety  Risk to Self:  none Risk to Others:  none [facility]  Past Medical History:  Past Medical History:  Diagnosis Date   Alcohol abuse    DJD (degenerative joint disease)    Hypertension 01/02/2015   History reviewed. No pertinent surgical history. Family History: History reviewed. No pertinent family history. Family Psychiatric  History: unknown Social History:  Social History   Substance and Sexual Activity  Alcohol Use Yes   Comment: 24pk beer     Social History   Substance and Sexual Activity  Drug Use No    Social History   Socioeconomic History   Marital status: Married    Spouse name: Not on file   Number of children: Not on file   Years of education: Not on file   Highest education level: Not on file  Occupational History   Not on file  Tobacco Use   Smoking status: Every Day    Years: 15.00    Types: Cigarettes   Smokeless tobacco: Never   Vaping Use   Vaping Use: Never used  Substance and Sexual Activity   Alcohol use: Yes    Comment: 24pk beer   Drug use: No   Sexual activity: Yes    Birth control/protection: Condom  Other Topics Concern   Not on file  Social History Narrative   Not on file   Social Determinants of Health   Financial Resource Strain: Not on file  Food Insecurity: Not on file  Transportation Needs: Not on file  Physical Activity: Not on file  Stress: Not on file  Social Connections: Not on file   Additional Social History:    Allergies:  No Known Allergies  Labs: No results found for this or any previous visit (from the past 48 hour(s)).  No current facility-administered medications for this encounter.   Current Outpatient Medications  Medication Sig Dispense Refill   methylPREDNISolone (MEDROL DOSEPAK) 4 MG TBPK tablet Take as prescribed 21 tablet 0   albuterol (VENTOLIN HFA) 108 (90 Base) MCG/ACT inhaler Inhale 2 puffs into the lungs every 6 (six) hours as needed for wheezing or shortness of breath. 18 g 1   citalopram (CELEXA) 20 MG tablet Take 1 tablet (20 mg total) by mouth daily. 30 tablet 2   folic acid (FOLVITE) 1 MG tablet Take 1 tablet (1 mg total) by mouth daily. (Patient not taking: Reported on 04/12/2021) 30 tablet 3   multivitamin (ONE-A-DAY MEN'S) TABS tablet Take 1  tablet by mouth daily. (Patient not taking: Reported on 04/12/2021) 30 tablet 3   pantoprazole (PROTONIX) 40 MG tablet Take 1 tablet (40 mg total) by mouth daily. (Patient not taking: Reported on 04/12/2021) 30 tablet 2   silver sulfADIAZINE (SILVADENE) 1 % cream Apply to affected area daily 50 g 1   thiamine 100 MG tablet Take 1 tablet (100 mg total) by mouth daily. (Patient not taking: Reported on 04/12/2021) 30 tablet 3    Musculoskeletal: Strength & Muscle Tone: within normal limits Gait & Station: normal Patient leans: N/A  Psychiatric Specialty Exam: Physical Exam Vitals and nursing note reviewed.   Constitutional:      Appearance: Normal appearance.  HENT:     Head: Normocephalic.     Nose: Nose normal.  Pulmonary:     Effort: Pulmonary effort is normal.  Musculoskeletal:        General: Normal range of motion.     Cervical back: Normal range of motion.  Neurological:     General: No focal deficit present.     Mental Status: He is alert and oriented to person, place, and time.  Psychiatric:        Attention and Perception: Attention and perception normal.        Mood and Affect: Mood is anxious.        Speech: Speech normal.        Behavior: Behavior normal. Behavior is cooperative.        Thought Content: Thought content normal.        Cognition and Memory: Cognition and memory normal.        Judgment: Judgment normal.    Review of Systems  Constitutional:  Positive for malaise/fatigue.  Neurological:  Sensory change: .pse.  Psychiatric/Behavioral:  Positive for substance abuse. The patient is nervous/anxious.   All other systems reviewed and are negative.  Blood pressure (!) 148/90, pulse 92, temperature 98.1 F (36.7 C), temperature source Oral, resp. rate 18, height '5\' 10"'$  (1.778 m), weight 90.7 kg, SpO2 96 %.Body mass index is 28.7 kg/m.  General Appearance: Casual  Eye Contact:  Good  Speech:  Normal Rate  Volume:  Normal  Mood:  Anxious  Affect:  Congruent  Thought Process:  Coherent and Descriptions of Associations: Intact  Orientation:  Full (Time, Place, and Person)  Thought Content:  WDL and Logical  Suicidal Thoughts:  No  Homicidal Thoughts:  No  Memory:  Immediate;   Good Recent;   Good Remote;   Good  Judgement:  Fair  Insight:  Fair  Psychomotor Activity:  Normal  Concentration:  Concentration: Fair and Attention Span: Fair  Recall:  [employer] of Knowledge:  Fair  Language:  Good  Akathisia:  No  Handed:  Right  AIMS (if indicated):     Assets:  Housing Leisure Time Physical Health Resilience Social Support  ADL's:  Intact   Cognition:  WNL  Sleep:        Physical Exam: Physical Exam Vitals and nursing note reviewed.  Constitutional:      Appearance: Normal appearance.  HENT:     Head: Normocephalic.     Nose: Nose normal.  Pulmonary:     Effort: Pulmonary effort is normal.  Musculoskeletal:        General: Normal range of motion.     Cervical back: Normal range of motion.  Neurological:     General: No focal deficit present.     Mental Status: He is alert  and oriented to person, place, and time.  Psychiatric:        Attention and Perception: Attention and perception normal.        Mood and Affect: Mood is anxious.        Speech: Speech normal.        Behavior: Behavior normal. Behavior is cooperative.        Thought Content: Thought content normal.        Cognition and Memory: Cognition and memory normal.        Judgment: Judgment normal.   Review of Systems  Constitutional:  Positive for malaise/fatigue.  Neurological:  Sensory change: .pse.  Psychiatric/Behavioral:  Positive for substance abuse. The patient is nervous/anxious.   All other systems reviewed and are negative. Blood pressure (!) 148/90, pulse 92, temperature 98.1 F (36.7 C), temperature source Oral, resp. rate 18, height '5\' 10"'$  (1.778 m), weight 90.7 kg, SpO2 96 %. Body mass index is 28.7 kg/m.  Treatment Plan Summary: Alcohol intoxication: Recommended detox/rehab, [facility] declined [facility]  Disposition: No evidence of imminent risk to self or others at present.   Patient does not meet criteria for psychiatric inpatient admission.  Waylan Boga, NP 11/21/2021 9:52 AM

## 2021-11-21 NOTE — ED Triage Notes (Signed)
Pt to ED via [facility] from home.  Pt c/o bilateral chronic hip pain, has drank alcohol to help with pain.  Pt ambulatory to ambulance per EMS.  Pt states his pain is 6/10 after drinking a case of beer, was a 9/10 prior to drinking.  Pt c/o sharp shooting pain down both legs.  Pt c/o intermittent numbness/tingling in legs.

## 2021-11-21 NOTE — ED Provider Notes (Signed)
-----------------------------------------   10:18 AM on 11/21/2021 -----------------------------------------  I took over care on this patient from Dr. Alfred Levins.  At this time the patient is alert and oriented and is clinically sober.  He is requesting to go home.  He was evaluated by NP Lord from psychiatry who does not advise any acute psychiatric intervention.  The patient is stable for discharge at this time.  Return precautions given, and he expresses understanding.   Arta Silence, MD 11/21/21 1018

## 2021-11-21 NOTE — ED Provider Notes (Signed)
[facility] Provider Note    Event Date/Time   First MD Initiated Contact with Patient 11/21/21 [phone]     (approximate)   History   Hip Pain   HPI  Jesse Obrien is a 61 y.o. male with a history of alcohol abuse and chronic hip pain who presents for evaluation of hip pain.  Patient reports that he has had bilateral hip pain since he was in his 10s.  He has been seen several times for the same including imaging studies done 3 weeks ago.  He has known history of osteoarthritis.  Has never seen a Psychologist, sport and exercise for it.  He reports that he drinks because this is the only thing that helps numb the pain.  He denies saddle anesthesia, lower extremity weakness, urinary or bowel incontinence.  He reports that the pain sometimes starts on his lower back and radiates down the leg and sometimes is on bilateral hips.  He reports that sometimes a sharp shooting pain that is so severe that he has tingling in his legs.  He denies any recent trauma.  He is asking for short of steroids because that has helped him in the past.  He denies any suicidal homicidal thoughts.  No IV drug use, no fever     Past Medical History:  Diagnosis Date   Alcohol abuse    DJD (degenerative joint disease)    Hypertension 01/02/2015    History reviewed. No pertinent surgical history.   Physical Exam   Triage Vital Signs: ED Triage Vitals  Enc Vitals Group     BP 11/21/21 0548 134/90     Pulse Rate 11/21/21 0548 94     Resp 11/21/21 0548 18     Temp 11/21/21 0548 98.1 F (36.7 C)     Temp Source 11/21/21 0548 Oral     SpO2 11/21/21 0548 97 %     Weight 11/21/21 0550 200 lb (90.7 kg)     Height 11/21/21 0550 '5\' 10"'$  (1.778 m)     Head Circumference --      Peak Flow --      Pain Score 11/21/21 0550 6     Pain Loc --      Pain Edu? --      Excl. in [place]? --     Most recent vital signs: Vitals:   11/21/21 0548 11/21/21 0600  BP: 134/90 (!) 148/90  Pulse: 94 98  Resp: 18 18  Temp: 98.1 F  (36.7 C)   SpO2: 97% 97%     Constitutional: Alert and oriented.  HEENT:      Head: Normocephalic and atraumatic.         Eyes: Conjunctivae are normal. Sclera is non-icteric.       Mouth/Throat: Mucous membranes are moist.       Neck: Supple with no signs of meningismus. Cardiovascular: Regular rate and rhythm. No murmurs, gallops, or rubs. 2+ symmetrical distal pulses are present in all extremities.  Respiratory: Normal respiratory effort. Lungs are clear to auscultation bilaterally.  Gastrointestinal: Soft, non tender, and non distended with positive bowel sounds. No rebound or guarding. Genitourinary: No CVA tenderness. Musculoskeletal: No midline spine tenderness, lower extremities are warm and well-perfused with intact strength and sensation, 1+ bilateral DTRs.  Patient has some discomfort with range of motion of the hips but range of motion is mostly intact Neurologic: Normal speech and language. Face is symmetric. Moving all extremities. No gross focal neurologic deficits are appreciated. Skin:  Skin is warm, dry and intact. No rash noted. Psychiatric: Mood and affect are normal. Speech and behavior are normal.  ED Results / Procedures / Treatments   Labs (all labs ordered are listed, but only abnormal results are displayed) Labs Reviewed - No data to display   EKG  none   RADIOLOGY I, Rudene Re, attending MD, have personally viewed and interpreted the images obtained during this visit as below:  CT showing severe osteoarthritis but no other acute findings   ___________________________________________________ Interpretation by Radiologist:  CT Lumbar Spine Wo Contrast  Result Date: 11/21/2021 CLINICAL DATA:  61 year old male with low back pain, hip pain. Pain radiating down both legs. EXAM: CT LUMBAR SPINE WITHOUT CONTRAST TECHNIQUE: Multidetector CT imaging of the lumbar spine was performed without intravenous contrast administration. Multiplanar CT image  reconstructions were also generated. RADIATION DOSE REDUCTION: This exam was performed according to the departmental dose-optimization program which includes automated exposure control, adjustment of the mA and/or kV according to patient size and/or use of iterative reconstruction technique. COMPARISON:  Lumbar radiographs 10/22/2021. CT Abdomen and Pelvis 09/20/2015. FINDINGS: Segmentation: Normal. Alignment: Stable lumbar lordosis since 2017. No significant scoliosis or spondylolisthesis. Vertebrae: No acute osseous abnormality identified. Healed right L1 transverse process fracture which was acute in 2017. Grossly intact visible sacrum and SI joints. Paraspinal and other soft tissues: Negative visible costophrenic angles. Mild abdominal aortic tortuosity and calcified atherosclerosis. Visible kidneys and other noncontrast abdominal viscera appear stable since 2017, negative. Lumbar paraspinal soft tissues are within normal limits. Partially visible mild large bowel diverticulosis in the pelvis. Disc levels: T10-T11: Partially visible, grossly negative. T11-T12: Negative aside from anterior endplate spurring. T12-L1:  Negative. L1-L2:  Minor disc bulging. No stenosis. L2-L3: Mild circumferential disc bulge. Mild to moderate facet and mild ligament flavum hypertrophy. Borderline to mild spinal stenosis. No foraminal stenosis. L3-L4: Mild circumferential disc bulge eccentric to the right. Moderate facet and ligament flavum hypertrophy. Mild spinal and right lateral recess stenosis (descending right L4 nerve level). No convincing foraminal stenosis. L4-L5: Trace vacuum disc. Moderate circumferential disc bulge, eccentric to the left. Moderate facet and mild ligament flavum hypertrophy. Mild spinal stenosis and at least mild left lateral recess stenosis (descending left L5 nerve level series 5, image 105). No foraminal stenosis. L5-S1: Posterior disc space loss. Broad-based posterior disc bulge or protrusion eccentric  to the left on series 5, image 119. Mild to moderate facet and ligament flavum hypertrophy. No spinal stenosis. Mild left lateral recess stenosis (left S1 nerve level). No foraminal stenosis. IMPRESSION: 1. No acute osseous abnormality in the lumbar spine. Healed right L1 transverse process fracture since 2017. 2. Lumbar disc and endplate degeneration maximal at L4-L5 and L5-S1. Up to mild spinal and/or lateral recess stenosis at L2-L3 through L5-S1. Descending right L4, left L5, and left S1 nerve levels appear most affected. Electronically Signed   By: Genevie Ann M.D.   On: 11/21/2021 06:58      PROCEDURES:  Critical Care performed: No  Procedures    IMPRESSION / MDM / ASSESSMENT AND PLAN / ED COURSE  I reviewed the triage vital signs and the nursing notes.  61 y.o. male with a history of alcohol abuse and chronic hip pain who presents for evaluation of b/l hip pain and lower back pain.  Patient reports chronic pain since he was in his 87s.  Denies any recent trauma.  Does report that the pain usually starts in his buttock region and radiates down to both legs.  Denies any loss of bowel or urine, retention, weakness in his lower extremities, saddle anesthesia.  He reports that sometimes the pain is so severe that causes tingling in his legs.  Patient has a history of alcohol abuse and reports that he drinks to numb the pain.  He is here requesting a shot of steroid which has helped in the past.  He is clearly intoxicated but pleasant, polite and appreciative of the care.  He denies any suicidal thoughts.  On exam he has full rotation of the hips with minimal discomfort mostly on the right 1.  No midline spine tenderness, no signs of cauda equina with intact strength and sensation and equal 1+ DTRs on bilateral lower extremities.  I have reviewed recent imaging that was done for patient from 2 recent visits within the last month including x-ray of the pelvis and left hip, and lumbar spine.  Only finding  is his arthritis.  We will get a CT of the lumbar spine and give him a shot of Decadron as he requested.  Patient has seen his primary care doctor 2 weeks ago for this and has been referred to orthopedics.  _________________________ 7:20 AM on 11/21/2021 ----------------------------------------- CT is negative for acute findings.  Patient is requesting to speak with a psychiatrist to restart her psychiatric meds.  Consult has been placed.  We will monitor patient until he is sober for discharge.  Care transferred to Dr. Cherylann Banas   MEDICATIONS GIVEN IN ED: Medications  dexamethasone (DECADRON) injection 10 mg (10 mg Intravenous Given 11/21/21 4782)    EMR reviewed  including prior imaging and recent visit with primary care doctor for bilateral osteoarthritis of the hip from 3 weeks ago    FINAL CLINICAL IMPRESSION(S) / ED DIAGNOSES   Final diagnoses:  Chronic bilateral low back pain with bilateral sciatica  Osteoarthritis of both hips, unspecified osteoarthritis type     Rx / DC Orders   ED Discharge Orders          Ordered    methylPREDNISolone (MEDROL DOSEPAK) 4 MG TBPK tablet        11/21/21 9562             Note:  This document was prepared using Dragon voice recognition software and may include unintentional dictation errors.   Please note:  Patient was evaluated in Emergency Department today for the symptoms described in the history of present illness. Patient was evaluated in the context of the global COVID-19 pandemic, which necessitated consideration that the patient might be at risk for infection with the SARS-CoV-2 virus that causes COVID-19. Institutional protocols and algorithms that pertain to the evaluation of patients at risk for COVID-19 are in a state of rapid change based on information released by regulatory bodies including the CDC and federal and state organizations. These policies and algorithms were followed during the patient's care in the ED.  Some  ED evaluations and interventions may be delayed as a result of limited staffing during the pandemic.       Alfred Levins, [place], MD 11/21/21 [phone]

## 2021-11-26 ENCOUNTER — Encounter: Payer: Self-pay | Admitting: Emergency Medicine

## 2021-11-26 ENCOUNTER — Other Ambulatory Visit: Payer: Self-pay

## 2021-11-26 ENCOUNTER — Emergency Department

## 2021-11-26 ENCOUNTER — Observation Stay
Admission: EM | Admit: 2021-11-26 | Discharge: 2021-11-26 | Discharge status: Against Medical Advice | Attending: Internal Medicine | Admitting: Internal Medicine

## 2021-11-26 DIAGNOSIS — F17210 Nicotine dependence, cigarettes, uncomplicated: Secondary | ICD-10-CM | POA: Diagnosis not present

## 2021-11-26 DIAGNOSIS — E871 Hypo-osmolality and hyponatremia: Secondary | ICD-10-CM

## 2021-11-26 DIAGNOSIS — F17200 Nicotine dependence, unspecified, uncomplicated: Secondary | ICD-10-CM | POA: Diagnosis present

## 2021-11-26 DIAGNOSIS — Z79899 Other long term (current) drug therapy: Secondary | ICD-10-CM | POA: Insufficient documentation

## 2021-11-26 DIAGNOSIS — F10129 Alcohol abuse with intoxication, unspecified: Secondary | ICD-10-CM

## 2021-11-26 DIAGNOSIS — D329 Benign neoplasm of meninges, unspecified: Secondary | ICD-10-CM | POA: Diagnosis not present

## 2021-11-26 DIAGNOSIS — M25551 Pain in right hip: Secondary | ICD-10-CM | POA: Insufficient documentation

## 2021-11-26 DIAGNOSIS — J449 Chronic obstructive pulmonary disease, unspecified: Secondary | ICD-10-CM | POA: Diagnosis present

## 2021-11-26 DIAGNOSIS — F1020 Alcohol dependence, uncomplicated: Secondary | ICD-10-CM | POA: Diagnosis present

## 2021-11-26 DIAGNOSIS — M25552 Pain in left hip: Secondary | ICD-10-CM | POA: Diagnosis not present

## 2021-11-26 DIAGNOSIS — S0081XA Abrasion of other part of head, initial encounter: Secondary | ICD-10-CM | POA: Insufficient documentation

## 2021-11-26 DIAGNOSIS — Y908 Blood alcohol level of 240 mg/100 ml or more: Secondary | ICD-10-CM | POA: Diagnosis not present

## 2021-11-26 DIAGNOSIS — F10229 Alcohol dependence with intoxication, unspecified: Principal | ICD-10-CM | POA: Insufficient documentation

## 2021-11-26 DIAGNOSIS — F1010 Alcohol abuse, uncomplicated: Secondary | ICD-10-CM

## 2021-11-26 DIAGNOSIS — W1839XA Other fall on same level, initial encounter: Secondary | ICD-10-CM | POA: Insufficient documentation

## 2021-11-26 DIAGNOSIS — I10 Essential (primary) hypertension: Secondary | ICD-10-CM | POA: Diagnosis not present

## 2021-11-26 DIAGNOSIS — F10929 Alcohol use, unspecified with intoxication, unspecified: Secondary | ICD-10-CM

## 2021-11-26 LAB — BASIC METABOLIC PANEL
Anion gap: 8 (ref 5–15)
BUN: 6 mg/dL (ref 6–20)
CO2: 24 mmol/L (ref 22–32)
Calcium: 8.6 mg/dL — ABNORMAL LOW (ref 8.9–10.3)
Chloride: 102 mmol/L (ref 98–111)
Creatinine, Ser: 0.73 mg/dL (ref 0.61–1.24)
GFR, Estimated: >60 mL/min (ref >60)
Glucose, Bld: 104 mg/dL — ABNORMAL HIGH (ref 70–99)
Potassium: 3.7 mmol/L (ref 3.5–5.1)
Sodium: 134 mmol/L — ABNORMAL LOW (ref 135–145)

## 2021-11-26 LAB — SODIUM, URINE, RANDOM: Sodium, Ur: 12 mmol/L

## 2021-11-26 LAB — CBC
HCT: 46.3 % (ref 39.0–52.0)
Hemoglobin: 15.6 g/dL (ref 13.0–17.0)
MCH: 32.4 pg (ref 26.0–34.0)
MCHC: 33.7 g/dL (ref 30.0–36.0)
MCV: 96.1 fL (ref 80.0–100.0)
Platelets: 189 10*3/uL (ref 150–400)
RBC: 4.82 MIL/uL (ref 4.22–5.81)
RDW: 12.4 % (ref 11.5–15.5)
WBC: 9 10*3/uL (ref 4.0–10.5)
nRBC: 0 % (ref 0.0–0.2)

## 2021-11-26 LAB — CBC WITH DIFFERENTIAL/PLATELET
Abs Immature Granulocytes: 0.13 10*3/uL — ABNORMAL HIGH (ref 0.00–0.07)
Basophils Absolute: 0.1 10*3/uL (ref 0.0–0.1)
Basophils Relative: 1 %
Eosinophils Absolute: 0.3 10*3/uL (ref 0.0–0.5)
Eosinophils Relative: 3 %
HCT: 45.5 % (ref 39.0–52.0)
Hemoglobin: 15.5 g/dL (ref 13.0–17.0)
Immature Granulocytes: 1 %
Lymphocytes Relative: 41 %
Lymphs Abs: 4.3 10*3/uL — ABNORMAL HIGH (ref 0.7–4.0)
MCH: 32.7 pg (ref 26.0–34.0)
MCHC: 34.1 g/dL (ref 30.0–36.0)
MCV: 96 fL (ref 80.0–100.0)
Monocytes Absolute: 0.8 10*3/uL (ref 0.1–1.0)
Monocytes Relative: 7 %
Neutro Abs: 5 10*3/uL (ref 1.7–7.7)
Neutrophils Relative %: 47 %
Platelets: 191 10*3/uL (ref 150–400)
RBC: 4.74 MIL/uL (ref 4.22–5.81)
RDW: 12.3 % (ref 11.5–15.5)
WBC: 10.5 10*3/uL (ref 4.0–10.5)
nRBC: 0 % (ref 0.0–0.2)

## 2021-11-26 LAB — COMPREHENSIVE METABOLIC PANEL
ALT: 37 U/L (ref 0–44)
AST: 31 U/L (ref 15–41)
Albumin: 4 g/dL (ref 3.5–5.0)
Alkaline Phosphatase: 80 U/L (ref 38–126)
Anion gap: 10 (ref 5–15)
BUN: 7 mg/dL (ref 6–20)
CO2: 21 mmol/L — ABNORMAL LOW (ref 22–32)
Calcium: 8.4 mg/dL — ABNORMAL LOW (ref 8.9–10.3)
Chloride: 95 mmol/L — ABNORMAL LOW (ref 98–111)
Creatinine, Ser: 0.79 mg/dL (ref 0.61–1.24)
GFR, Estimated: >60 mL/min (ref >60)
Glucose, Bld: 102 mg/dL — ABNORMAL HIGH (ref 70–99)
Potassium: 3.4 mmol/L — ABNORMAL LOW (ref 3.5–5.1)
Sodium: 126 mmol/L — ABNORMAL LOW (ref 135–145)
Total Bilirubin: 0.7 mg/dL (ref 0.3–1.2)
Total Protein: 7.4 g/dL (ref 6.5–8.1)

## 2021-11-26 LAB — ETHANOL: Alcohol, Ethyl (B): 307 mg/dL (ref <10)

## 2021-11-26 LAB — OSMOLALITY, URINE: Osmolality, Ur: 124 mOsm/kg — ABNORMAL LOW (ref 300–900)

## 2021-11-26 LAB — OSMOLALITY: Osmolality: 345 mOsm/kg (ref 275–295)

## 2021-11-26 MED ORDER — ACETAMINOPHEN 325 MG PO TABS
650.0000 mg | ORAL_TABLET | Freq: Four times a day (QID) | ORAL | Status: DC | PRN
Start: 2021-11-26 — End: 2021-11-26

## 2021-11-26 MED ORDER — LORAZEPAM 2 MG/ML IJ SOLN: 0.0000 mg | Freq: Four times a day (QID) | INTRAMUSCULAR | Status: DC

## 2021-11-26 MED ORDER — ADULT MULTIVITAMIN W/MINERALS CH: 1.0000 | ORAL_TABLET | Freq: Every day | ORAL | Status: DC

## 2021-11-26 MED ORDER — ONDANSETRON HCL 4 MG PO TABS: 4.0000 mg | ORAL_TABLET | Freq: Four times a day (QID) | ORAL | Status: DC | PRN

## 2021-11-26 MED ORDER — ONDANSETRON HCL 4 MG/2ML IJ SOLN: 4.0000 mg | Freq: Four times a day (QID) | INTRAMUSCULAR | Status: DC | PRN

## 2021-11-26 MED ORDER — THIAMINE HCL 100 MG/ML IJ SOLN: 100.0000 mg | Freq: Every day | INTRAMUSCULAR | Status: DC

## 2021-11-26 MED ORDER — THIAMINE HCL 100 MG/ML IJ SOLN
100.0000 mg | Freq: Once | INTRAMUSCULAR | Status: AC
  Administered 2021-11-26: 100 mg via INTRAVENOUS
  Filled 2021-11-26: qty 2

## 2021-11-26 MED ORDER — ENOXAPARIN SODIUM 40 MG/0.4ML IJ SOSY: 40.0000 mg | PREFILLED_SYRINGE | INTRAMUSCULAR | Status: DC

## 2021-11-26 MED ORDER — ACETAMINOPHEN 325 MG RE SUPP: 650.0000 mg | Freq: Four times a day (QID) | RECTAL | Status: DC | PRN

## 2021-11-26 MED ORDER — FOLIC ACID 1 MG PO TABS: 1.0000 mg | ORAL_TABLET | Freq: Every day | ORAL | Status: DC

## 2021-11-26 MED ORDER — LORAZEPAM 2 MG/ML IJ SOLN: 1.0000 mg | INTRAMUSCULAR | Status: DC | PRN

## 2021-11-26 MED ORDER — THIAMINE HCL 100 MG PO TABS: 100.0000 mg | ORAL_TABLET | Freq: Every day | ORAL | Status: DC

## 2021-11-26 MED ORDER — ADULT MULTIVITAMIN W/MINERALS CH
1.0000 | ORAL_TABLET | Freq: Once | ORAL | Status: AC
  Administered 2021-11-26: 1 via ORAL
  Filled 2021-11-26: qty 1

## 2021-11-26 MED ORDER — LACTATED RINGERS IV BOLUS
1000.0000 mL | Freq: Once | INTRAVENOUS | Status: AC
  Administered 2021-11-26: 1000 mL via INTRAVENOUS

## 2021-11-26 MED ORDER — LORAZEPAM 1 MG PO TABS: 1.0000 mg | ORAL_TABLET | ORAL | Status: DC | PRN

## 2021-11-26 MED ORDER — LORAZEPAM 2 MG/ML IJ SOLN: 0.0000 mg | Freq: Two times a day (BID) | INTRAMUSCULAR | Status: DC

## 2021-11-26 NOTE — Assessment & Plan Note (Signed)
-  Nicotine patch 

## 2021-11-26 NOTE — Assessment & Plan Note (Addendum)
Sodium 126 down from 135 a couple weeks prior Suspect hypotonic hyponatremia/beer potomania.  Patient drank a 24 pack of beer Patient got a liter of LR in the ED We will fluid restrict, frequent sodium checks and monitor neurologic status Urine and serum osmolality and urine sodium ordered

## 2021-11-26 NOTE — ED Notes (Signed)
XR at bedside

## 2021-11-26 NOTE — BH Assessment (Signed)
Comprehensive Clinical Assessment (CCA) Note  11/26/2021 Jesse Obrien [id number]  Chief Complaint: Patient is a 61 year old male presenting to [facility] ED voluntarily. Per triage note Pt to ED via [facility] from home for alcohol intoxication, and fall tonight. Pt drank 24 pk of beer. Pt states he hit his head, pt also states he had LOC. Pt has chronic hip pain. Pt stating he wants to get help for drinking alcohol. During assessment patient appears alert and oriented x4, cooperative, speech is slurred and patient appears to be under the influence. Patient reports that he drinks "1 case of beer every 3-4 days." Patient reports age of first use at 61 years old. Patient denies any current withdrawals. Patient reports having detox treatment in the past. Patient denies current SI/HI/AH/VH. Chief Complaint  Patient presents with   Alcohol Intoxication   Hip Pain   Visit Diagnosis: Alcohol use disorder, severe    CCA Screening, Triage and Referral (STR)  Patient Reported Information How did you hear about [place]? Self  Referral name: No data recorded Referral phone number: No data recorded  Whom do you see for routine medical problems? No data recorded Practice/Facility Name: No data recorded Practice/Facility Phone Number: No data recorded Name of Contact: No data recorded Contact Number: No data recorded Contact Fax Number: No data recorded Prescriber Name: No data recorded Prescriber Address (if known): No data recorded  What Is the Reason for Your Visit/Call Today? Patient presents voluntarily requesting alcohol detox  How Long Has This Been Causing You Problems? > than 6 months  What Do You Feel Would Help You the Most Today? Alcohol or Drug Use Treatment   Have You Recently Been in Any Inpatient Treatment (Hospital/Detox/Crisis Center/28-Day Program)? No data recorded Name/Location of Program/Hospital:No data recorded How Long Were You There? No data recorded When Were You Discharged? No  data recorded  Have You Ever Received Services From [facility] Before? No data recorded Who Do You See at [facility]? No data recorded  Have You Recently Had Any Thoughts About Hurting Yourself? No  Are You Planning to Commit Suicide/Harm Yourself At This time? No   Have you Recently Had Thoughts About [place]? No  Explanation: No data recorded  Have You Used Any Alcohol or Drugs in the Past 24 Hours? Yes  How Long Ago Did You Use Drugs or Alcohol? No data recorded What Did You Use and How Much? Alcohol, "1 case of beer"   Do You Currently Have a Therapist/Psychiatrist? No  Name of Therapist/Psychiatrist: No data recorded  Have You Been Recently Discharged From Any Office Practice or Programs? No  Explanation of Discharge From Practice/Program: No data recorded    CCA Screening Triage Referral Assessment Type of Contact: Face-to-Face  Is this Initial or Reassessment? No data recorded Date Telepsych consult ordered in [facility]:  No data recorded Time Telepsych consult ordered in [facility]:  No data recorded  Patient Reported Information Reviewed? No data recorded Patient Left Without Being Seen? No data recorded Reason for Not Completing Assessment: No data recorded  Collateral Involvement: None provided   Does Patient Have a [place]? No data recorded Name and Contact of Legal Guardian: No data recorded If Minor and Not Living with Parent(s), Who has Custody? n/a  Is CPS involved or ever been involved? Never  Is APS involved or ever been involved? Never   Patient Determined To Be At Risk for Harm To Self or Others Based on Review of  Patient Reported Information or Presenting Complaint? No  Method: No data recorded Availability of Means: No data recorded Intent: No data recorded Notification Required: No data recorded Additional Information for Danger to Others Potential: No data recorded Additional Comments for Danger to Others  Potential: No data recorded Are There Guns or Other Weapons in Your Home? No data recorded Types of Guns/Weapons: No data recorded Are These Weapons Safely Secured?                            No data recorded Who Could Verify You Are Able To Have These Secured: No data recorded Do You Have any Outstanding Charges, Pending Court Dates, Parole/Probation? No data recorded Contacted To Inform of Risk of Harm To Self or Others: No data recorded  [facility] of Assessment[facility] ED   Does Patient Present under Involuntary Commitment? No  IVC Papers Initial File Date: 04/12/21   [place] of Residence: [place]   Patient Currently Receiving the Following Services: Not Receiving Services   Determination of Need: Emergent (2 hours)   Options For Referral: Chemical Dependency Intensive Outpatient Therapy (CDIOP); ED Visit; Outpatient Therapy     CCA Biopsychosocial Intake/Chief Complaint:  No data recorded Current Symptoms/Problems: No data recorded  Patient Reported Schizophrenia/Schizoaffective Diagnosis in Past: No   Strengths: Patient is able to communicate his needs  Preferences: No data recorded Abilities: No data recorded  Type of Services Patient Feels are Needed: No data recorded  Initial Clinical Notes/Concerns: No data recorded  Mental Health Symptoms Depression:   None   Duration of Depressive symptoms: No data recorded  Mania:   None   Anxiety:    Fatigue; Irritability   Psychosis:   None   Duration of Psychotic symptoms: No data recorded  Trauma:   None   Obsessions:   None   Compulsions:   None   Inattention:   None   Hyperactivity/Impulsivity:   None   Oppositional/Defiant Behaviors:   None   Emotional Irregularity:   None   Other Mood/Personality Symptoms:  No data recorded   Mental Status Exam Appearance and self-care  Stature:   Average   Weight:   Overweight   Clothing:   Disheveled   Grooming:   Neglected   Cosmetic  use:   None   Posture/gait:   Normal   Motor activity:   Not Remarkable   Sensorium  Attention:   Normal   Concentration:   Normal   Orientation:   X5   Recall/memory:   Normal   Affect and Mood  Affect:   Appropriate   Mood:   Other (Comment)   Relating  Eye contact:   Normal   Facial expression:   Responsive   Attitude toward examiner:   Cooperative   Thought and Language  Speech flow:  Slurred   Thought content:   Appropriate to Mood and Circumstances   Preoccupation:   None   Hallucinations:   None   Organization:  No data recorded  [employer] of Knowledge:   Fair   Intelligence:   Average   Abstraction:   Functional   Judgement:   Impaired   Reality Testing:   Distorted   Insight:   Lacking   Decision Making:   Impulsive   Social Functioning  Social Maturity:   Responsible   Social Judgement:   Normal   Stress  Stressors:   Family conflict   Coping Ability:  Exhausted   Skill Deficits:   None   Supports:   Family     Religion: Religion/Spirituality Are You A Religious Person?: No  Leisure/Recreation: Leisure / Recreation Do You Have Hobbies?: No  Exercise/Diet: Exercise/Diet Do You Exercise?: No Have You Gained or Lost A Significant Amount of Weight in the Past Six Months?: No Do You Follow a Special Diet?: No Do You Have Any Trouble Sleeping?: No   CCA Employment/Education Employment/Work Situation: Employment / Work Situation Employment Situation: Unemployed Has Patient ever Been in Passenger transport manager?: No  Education: Education Is Patient Currently Attending School?: No Did Physicist, medical?: No Did You Have An Individualized Education Program (IIEP): No Did You Have Any Difficulty At [employer]?: No Patient's Education Has Been Impacted by Current Illness: No   CCA Family/Childhood History Family and Relationship History: Family history Marital status: Single Does  patient have children?: No  Childhood History:  Childhood History Did patient suffer any verbal/emotional/physical/sexual abuse as a child?: No Did patient suffer from severe childhood neglect?: No Has patient ever been sexually abused/assaulted/raped as an adolescent or adult?: No Was the patient ever a victim of a crime or a disaster?: No Witnessed domestic violence?: No Has patient been affected by domestic violence as an adult?: No  Child/Adolescent Assessment:     CCA Substance Use Alcohol/Drug Use: Alcohol / Drug Use Pain Medications: See MAR Prescriptions: See MAR Over the Counter: See MAR History of alcohol / drug use?: Yes Substance #1 Name of Substance 1: Alcohol 1 - Age of First Use: 8 1 - Amount (size/oz): "1 case of beer" 1 - Frequency: "every 3-4 days" 1 - Last Use / Amount: 11/25/21 1- Route of Use: Oral                       ASAM's:  Six Dimensions of Multidimensional Assessment  Dimension 1:  Acute Intoxication and/or Withdrawal Potential:      Dimension 2:  Biomedical Conditions and Complications:      Dimension 3:  Emotional, Behavioral, or Cognitive Conditions and Complications:     Dimension 4:  Readiness to Change:     Dimension 5:  Relapse, Continued use, or Continued Problem Potential:     Dimension 6:  Recovery/Living Environment:     ASAM Severity Score:    ASAM Recommended Level of Treatment: ASAM Recommended Level of Treatment: Level III Residential Treatment   Substance use Disorder (SUD) Substance Use Disorder (SUD)  Checklist Symptoms of Substance Use: Continued use despite having a persistent/recurrent physical/psychological problem caused/exacerbated by use, Evidence of tolerance, Presence of craving or strong urge to use, Social, occupational, recreational activities given up or reduced due to use, Evidence of withdrawal (Comment), Recurrent use that results in a failure to fulfill major role obligations (work, school, home),  Large amounts of time spent to obtain, use or recover from the substance(s), Substance(s) often taken in larger amounts or over longer times than was intended, Continued use despite persistent or recurrent social, interpersonal problems, caused or exacerbated by use, Persistent desire or unsuccessful efforts to cut down or control use, Repeated use in physically hazardous situations  Recommendations for Services/Supports/Treatments: Recommendations for Services/Supports/Treatments Recommendations For Services/Supports/Treatments: Detox  DSM5 Diagnoses: Patient Active Problem List   Diagnosis Date Noted   Acute hyponatremia 11/26/2021   Alcohol abuse    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   Cellulitis of great toe of left foot 02/08/2021   COPD (chronic  obstructive pulmonary disease) ([place]) 01/28/2021   Acute alcohol intoxication ([place]) 12/20/2020   Tobacco use disorder 10/28/2015   GERD (gastroesophageal reflux disease) 10/28/2015   Alcohol use disorder, moderate, dependence ([place]) 03/20/2015   DDD (degenerative disc disease), lumbar 03/20/2015   Osteoarthrosis, unspecified whether generalized or localized, involving lower leg 03/20/2015   Spinal stenosis of lumbar region 03/20/2015   Infected laceration 01/02/2015   Hypertension 01/02/2015    Patient Centered Plan: Patient is on the following Treatment Plan(s):  Substance Abuse   Referrals to Alternative Service(s): Referred to Alternative Service(s):   Place:   Date:   Time:    Referred to Alternative Service(s):   Place:   Date:   Time:    Referred to Alternative Service(s):   Place:   Date:   Time:    Referred to Alternative Service(s):   Place:   Date:   Time:      '@BHCOLLABOFCARE'$ @  [employer], LCAS-A

## 2021-11-26 NOTE — ED Provider Notes (Signed)
[facility] Provider Note    Event Date/Time   First MD Initiated Contact with Patient 11/26/21 0255     (approximate)   History   Alcohol Intoxication and Hip Pain   HPI  Jesse Obrien is a 61 y.o. male who presents to the ED for evaluation of Alcohol Intoxication and Hip Pain   I reviewed recurrent ED visits for alcohol abuse and chronic hip pain.  Patient presents to the ED by EMS for evaluation of chronic bilateral hip pain, fall that occurred last night and requesting help for his alcoholism.  He reports drinking a 24 pack/case of beer every day.  Reports his bilateral hips always hurt, but he fell last night and struck his forehead.  Denies any SI, HI or hallucinations.  He is requesting help and is interested in detox or assistance with his drinking.  Denies any coingestions beyond ethanol.  Denies assault.   Physical Exam   Triage Vital Signs: ED Triage Vitals  Enc Vitals Group     BP 11/26/21 0255 (!) 128/102     Pulse Rate 11/26/21 0255 94     Resp 11/26/21 0255 17     Temp 11/26/21 0255 98 F (36.7 C)     Temp Source 11/26/21 0255 Oral     SpO2 11/26/21 0255 96 %     Weight 11/26/21 0256 205 lb (93 kg)     Height 11/26/21 0256 '5\' 10"'$  (1.778 m)     Head Circumference --      Peak Flow --      Pain Score 11/26/21 0256 8     Pain Loc --      Pain Edu? --      Excl. in [place]? --     Most recent vital signs: Vitals:   11/26/21 0255 11/26/21 0300  BP: (!) 128/102 123/85  Pulse: 94 91  Resp: 17 16  Temp: 98 F (36.7 C)   SpO2: 96% 95%    General: Awake, no distress.  Clinically intoxicated. CV:  Good peripheral perfusion.  Resp:  Normal effort.  Abd:  No distention.  Soft and benign. MSK:  Scabbing wound to his left forehead, about 1 x 1 cm.  No active bleeding, hematoma or associated bony step-offs. Neuro:  No focal deficits appreciated. Cranial nerves II through XII intact 5/5 strength and sensation in all 4  extremities Other:     ED Results / Procedures / Treatments   Labs (all labs ordered are listed, but only abnormal results are displayed) Labs Reviewed  CBC WITH DIFFERENTIAL/PLATELET - Abnormal; Notable for the following components:      Result Value   Lymphs Abs 4.3 (*)    Abs Immature Granulocytes 0.13 (*)    All other components within normal limits  COMPREHENSIVE METABOLIC PANEL - Abnormal; Notable for the following components:   Sodium 126 (*)    Potassium 3.4 (*)    Chloride 95 (*)    CO2 21 (*)    Glucose, Bld 102 (*)    Calcium 8.4 (*)    All other components within normal limits  ETHANOL - Abnormal; Notable for the following components:   Alcohol, Ethyl (B) 307 (*)    All other components within normal limits    EKG   RADIOLOGY CXR interpreted by me without evidence of acute cardiopulmonary pathology. CT head interpreted by me without evidence of acute intracranial pathology   Official radiology report(s): CT HEAD WO CONTRAST (5MM)  Result Date: 11/26/2021 CLINICAL DATA:  Intoxication, fall, head injury EXAM: CT HEAD WITHOUT CONTRAST TECHNIQUE: Contiguous axial images were obtained from the base of the skull through the vertex without intravenous contrast. RADIATION DOSE REDUCTION: This exam was performed according to the departmental dose-optimization program which includes automated exposure control, adjustment of the mA and/or kV according to patient size and/or use of iterative reconstruction technique. COMPARISON:  10/28/2021 FINDINGS: Brain: Normal anatomic configuration. Stable left parafalcine calvarial 14 mm partially calcified meningioma at the vertex. No abnormal intra or extra-axial fluid collection. No abnormal mass effect or midline shift. No evidence of acute intracranial hemorrhage or infarct. Ventricular size is normal. Cerebellum unremarkable. Vascular: Unremarkable Skull: Remote depressed left frontal bone fracture with residual deformity and  concavity involving the anterior wall of the left frontal sinus. No acute fracture. Sinuses/Orbits: Paranasal sinuses are clear. Orbits are unremarkable. Other: Mastoid air cells and middle ear cavities are clear. IMPRESSION: No acute intracranial abnormality.  No acute calvarial fracture. Stable partially calcified left calvarial parafalcine meningioma. Electronically Signed   By: Fidela Salisbury M.D.   On: 11/26/2021 03:22   DG Pelvis Portable  Result Date: 11/26/2021 CLINICAL DATA:  Fall EXAM: PORTABLE PELVIS 1-2 VIEWS COMPARISON:  10/23/2012 FINDINGS: Symmetric degenerative changes in the hips with joint space narrowing and spurring. No acute bony abnormality. Specifically, no fracture, subluxation, or dislocation. IMPRESSION: No acute bony abnormality. Electronically Signed   By: Rolm Baptise M.D.   On: 11/26/2021 03:48   DG Chest Portable 1 View  Result Date: 11/26/2021 CLINICAL DATA:  Cough.  Fall. EXAM: PORTABLE CHEST 1 VIEW COMPARISON:  10/27/2021 FINDINGS: Heart and mediastinal contours are within normal limits. No focal opacities or effusions. No acute bony abnormality. IMPRESSION: No active disease. Electronically Signed   By: Rolm Baptise M.D.   On: 11/26/2021 03:48    PROCEDURES and INTERVENTIONS:  .1-3 Lead EKG Interpretation Performed by: Vladimir Crofts, MD Authorized by: Vladimir Crofts, MD     Interpretation: normal     ECG rate:  88   ECG rate assessment: normal     Rhythm: sinus rhythm     Ectopy: none     Conduction: normal    Medications  lactated ringers bolus 1,000 mL (1,000 mLs Intravenous New Bag/Given 11/26/21 0332)  multivitamin with minerals tablet 1 tablet (1 tablet Oral Given 11/26/21 0334)  thiamine (B-1) injection 100 mg (100 mg Intravenous Given 11/26/21 0333)     IMPRESSION / MDM / ASSESSMENT AND PLAN / ED COURSE  I reviewed the triage vital signs and the nursing notes.  Differential diagnosis includes, but is not limited to, alcohol withdrawals, DTs, skull  fracture, SDH, hip fracture  {Patient presents with symptoms of an acute illness or injury that is potentially life-threatening.  61 year old alcoholic presents to the ED after a fall without evidence of significant acute traumatic pathology but found to be drunk and acutely hyponatremic requiring medical observation admission.  No signs of neurologic or vascular deficits.  He did strike his head and considering his alcoholism and associated possible coagulopathy, CT head obtained and without evidence of ICH or skull fracture.  Blood work shows acute hyponatremia to 126 as well as his suspected ethanol intoxication.  No signs of hip fracture or rib fractures or PTX.  Due to his acute hyponatremia, we will consult with medicine for observation admission.  Consult with TTS/social work for assistance with any detox possibilities      FINAL CLINICAL IMPRESSION(S) / ED DIAGNOSES  Final diagnoses:  Hyponatremia  Alcohol abuse     Rx / DC Orders   ED Discharge Orders     None        Note:  This document was prepared using Dragon voice recognition software and may include unintentional dictation errors.   Vladimir Crofts, MD 11/26/21 [phone]

## 2021-11-26 NOTE — ED Triage Notes (Signed)
Pt to ED via [facility] from home for alcohol intoxication, and fall tonight. Pt drank 24 pk of beer. Pt states he hit his head, pt also states he had LOC. Pt has chronic hip pain. Pt stating he wants to get help for drinking alcohol. Pt has small abrasion to forehead that occurred 2 days ago.  Pt is a&ox4.

## 2021-11-26 NOTE — ED Notes (Signed)
This RN was in another room and informed by another RN that pt was leaving prior to receiving discharged instructions. Pt self removed IV.

## 2021-11-26 NOTE — H&P (Signed)
History and Physical    Patient: Jesse Obrien YKD:[id number] DOB: 03-May-1961 DOA: 11/26/2021 DOS: the patient was seen and examined on 11/26/2021 PCP: System, Provider Not In  Patient coming from: Home  Chief Complaint:  Chief Complaint  Patient presents with   Alcohol Intoxication   Hip Pain    HPI: Jesse Obrien is a 61 y.o. male with medical history significant for Tobacco use disorder, nicotine dependence who presents to the ED following a fall while intoxicated with brief LOC and sustaining an abrasion to the forehead.  Has been drinking and drank a 24 pack of beer and states he drinks a case every 3 to 4 days.  He states he wants to get help for his drinking. ED course and data review: BP 128/102 with otherwise normal vitals.  EtOH level 307, sodium 126, potassium 3.4.  CBC WNL.  CT head nonacute and shows stable left meningioma.  Chest x-ray clear hip x-ray with no bony abnormality. Patient hydrated with an LR bolus, given multivitamin and thiamine.  Hospitalist consulted for admission.   Review of Systems: As mentioned in the history of present illness. All other systems reviewed and are negative. Past Medical History:  Diagnosis Date   Alcohol abuse    DJD (degenerative joint disease)    Hypertension 01/02/2015   History reviewed. No pertinent surgical history. Social History:  reports that he has been smoking cigarettes. He has never used smokeless tobacco. He reports current alcohol use. He reports that he does not use drugs.  No Known Allergies  No family history on file.  Prior to Admission medications   Medication Sig Start Date End Date Taking? Authorizing Provider  albuterol (VENTOLIN HFA) 108 (90 Base) MCG/ACT inhaler Inhale 2 puffs into the lungs every 6 (six) hours as needed for wheezing or shortness of breath. 01/28/21   Clapacs, Madie [place], MD  citalopram (CELEXA) 20 MG tablet Take 1 tablet (20 mg total) by mouth daily. 06/24/19 06/23/20  Hinda Kehr, MD   methylPREDNISolone (MEDROL DOSEPAK) 4 MG TBPK tablet Take as prescribed 11/21/21   Alfred Levins, [place], MD  multivitamin (ONE-A-DAY MEN'S) TABS tablet Take 1 tablet by mouth daily. Patient not taking: Reported on 04/12/2021 11/22/20   Ward, Delice Bison, DO  pantoprazole (PROTONIX) 40 MG tablet Take 1 tablet (40 mg total) by mouth daily. Patient not taking: Reported on 04/12/2021 06/24/19   Hinda Kehr, MD  silver sulfADIAZINE (SILVADENE) 1 % cream Apply to affected area daily 04/22/21 04/22/22  Rudene Re, MD  thiamine 100 MG tablet Take 1 tablet (100 mg total) by mouth daily. Patient not taking: Reported on 04/12/2021 11/22/20   Ward, Delice Bison, DO    Physical Exam: Vitals:   11/26/21 0255 11/26/21 0256 11/26/21 0300  BP: (!) 128/102  123/85  Pulse: 94  91  Resp: 17  16  Temp: 98 F (36.7 C)    TempSrc: Oral    SpO2: 96%  95%  Weight:  93 kg   Height:  '5\' 10"'$  (1.778 m)    Physical Exam Vitals and nursing note reviewed.  Constitutional:      General: He is not in acute distress. HENT:     Head: Normocephalic and atraumatic.  Cardiovascular:     Rate and Rhythm: Normal rate and regular rhythm.     Heart sounds: Normal heart sounds.  Pulmonary:     Effort: Pulmonary effort is normal.     Breath sounds: Normal breath sounds.  Abdominal:  Palpations: Abdomen is soft.     Tenderness: There is no abdominal tenderness.  Neurological:     General: No focal deficit present.     Data Reviewed: Relevant notes from primary care and specialist visits, past discharge summaries as available in EHR, including Care Everywhere. Prior diagnostic testing as pertinent to current admission diagnoses Updated medications and problem lists for reconciliation ED course, including vitals, labs, imaging, treatment and response to treatment Triage notes, nursing and pharmacy notes and ED provider's notes Notable results as noted in HPI   Assessment and Plan: * Acute  hyponatremia Sodium 126 down from 135 a couple weeks prior Suspect hypotonic hyponatremia/beer potomania.  Patient drank a 24 pack of beer Patient got a liter of LR in the ED We will fluid restrict, frequent sodium checks and monitor neurologic status Urine and serum osmolality and urine sodium ordered  Acute alcohol intoxication ([place]) Blood alcohol level 307 Patient interested in detox  COPD (chronic obstructive pulmonary disease) ([place]) Not acutely exacerbated.  DuoNebs as needed  Tobacco use disorder Nicotine patch  Alcohol use disorder, moderate, dependence ([place]) Patient interesting in detox Alcohol withdrawal prevention protocol TOC consult  Hypertension BP controlled       Advance Care Planning:   Code Status: Prior   Consults: none  Family Communication: none  Severity of Illness: The appropriate patient status for this patient is OBSERVATION. Observation status is judged to be reasonable and necessary in order to provide the required intensity of service to ensure the patient's safety. The patient's presenting symptoms, physical exam findings, and initial radiographic and laboratory data in the context of their medical condition is felt to place them at decreased risk for further clinical deterioration. Furthermore, it is anticipated that the patient will be medically stable for discharge from the hospital within 2 midnights of admission.   Author: Athena Masse, MD 11/26/2021 4:51 AM  For on call review [email or web address].

## 2021-11-26 NOTE — ED Notes (Signed)
Pt placed on 2 L of supplemental oxygen after de-stating to 88-89% while sleeping. Pt SpO2 increased to 92-93%

## 2021-11-26 NOTE — Assessment & Plan Note (Signed)
Not acutely exacerbated DuoNebs as needed 

## 2021-11-26 NOTE — ED Notes (Addendum)
Pt ambulated in room with standby assist. Pt had no issues or complaints with ambulation. MD made aware.

## 2021-11-26 NOTE — ED Notes (Signed)
Assisted pt with using urinal. Pt had urinated on self. Pt was able to follow commands for RN and move side to side. Pt provided with new sheets. Refused shirt removal. Pt repositioned and reconnected to VS monitor

## 2021-11-26 NOTE — Assessment & Plan Note (Signed)
Patient interesting in detox Alcohol withdrawal prevention protocol TOC consult

## 2021-11-26 NOTE — ED Notes (Signed)
Critical lab Serum osmo   345 MD notified thru chat

## 2021-11-26 NOTE — Discharge Summary (Signed)
Patient left AGAINST MEDICAL ADVICE.  This is a Designer, television/film set

## 2021-11-26 NOTE — ED Notes (Signed)
Medical Admit/ TOC Consult

## 2021-11-26 NOTE — ED Provider Notes (Signed)
Patient had been admitted medically.  He apparently got angry and got dressed and began walking out of the emergency room.  I quickly looked at his lab work.  His sodium has come up to his osmolality was still high at 6 this morning.  I quickly chased after the patient but by the time I had finished looking at his labs he had gone out of the hospital and I could not find him any longer.  He did walk quickly and easily without any difficulty was speaking clearly and appeared to be clinically sober when he left.  This is an [place] elopement basically after being admitted.   Nena Polio, MD 11/26/21 [phone]

## 2021-11-26 NOTE — Assessment & Plan Note (Signed)
B/P controlled 

## 2021-11-26 NOTE — Assessment & Plan Note (Signed)
Blood alcohol level 307 Patient interested in detox

## 2021-11-26 NOTE — ED Notes (Signed)
Admitting Dr. Damita Dunnings at bedside

## 2021-11-26 NOTE — ED Notes (Signed)
Patient transported to CT. Will return for initial assessment

## 2021-11-26 NOTE — ED Notes (Signed)
This RN attempted to help patient set up breakfast tray and eat. Patient denied this RNs help. Urinal provided to patient to urinate

## 2022-01-06 LAB — COLOGUARD: COLOGUARD: NEGATIVE

## 2022-01-06 LAB — EXTERNAL GENERIC LAB PROCEDURE: COLOGUARD: NEGATIVE

## 2022-07-26 ENCOUNTER — Other Ambulatory Visit: Payer: Self-pay

## 2022-07-26 ENCOUNTER — Emergency Department
Admission: EM | Admit: 2022-07-26 | Discharge: 2022-07-26 | Disposition: A | Discharge status: Home / Self Care | Attending: Emergency Medicine | Admitting: Emergency Medicine

## 2022-07-26 DIAGNOSIS — R45851 Suicidal ideations: Secondary | ICD-10-CM

## 2022-07-26 DIAGNOSIS — I10 Essential (primary) hypertension: Secondary | ICD-10-CM | POA: Diagnosis not present

## 2022-07-26 DIAGNOSIS — F17210 Nicotine dependence, cigarettes, uncomplicated: Secondary | ICD-10-CM | POA: Insufficient documentation

## 2022-07-26 DIAGNOSIS — X58XXXA Exposure to other specified factors, initial encounter: Secondary | ICD-10-CM | POA: Diagnosis not present

## 2022-07-26 DIAGNOSIS — F10920 Alcohol use, unspecified with intoxication, uncomplicated: Secondary | ICD-10-CM

## 2022-07-26 DIAGNOSIS — F10120 Alcohol abuse with intoxication, uncomplicated: Secondary | ICD-10-CM | POA: Insufficient documentation

## 2022-07-26 DIAGNOSIS — Y908 Blood alcohol level of 240 mg/100 ml or more: Secondary | ICD-10-CM | POA: Insufficient documentation

## 2022-07-26 DIAGNOSIS — S1081XA Abrasion of other specified part of neck, initial encounter: Secondary | ICD-10-CM | POA: Insufficient documentation

## 2022-07-26 DIAGNOSIS — F10129 Alcohol abuse with intoxication, unspecified: Secondary | ICD-10-CM | POA: Diagnosis not present

## 2022-07-26 LAB — CBC WITH DIFFERENTIAL/PLATELET
Abs Immature Granulocytes: 0.07 10*3/uL (ref 0.00–0.07)
Basophils Absolute: 0 10*3/uL (ref 0.0–0.1)
Basophils Relative: 0 %
Eosinophils Absolute: 0.2 10*3/uL (ref 0.0–0.5)
Eosinophils Relative: 2 %
HCT: 47 % (ref 39.0–52.0)
Hemoglobin: 15.6 g/dL (ref 13.0–17.0)
Immature Granulocytes: 1 %
Lymphocytes Relative: 36 %
Lymphs Abs: 3.2 10*3/uL (ref 0.7–4.0)
MCH: 32 pg (ref 26.0–34.0)
MCHC: 33.2 g/dL (ref 30.0–36.0)
MCV: 96.5 fL (ref 80.0–100.0)
Monocytes Absolute: 0.6 10*3/uL (ref 0.1–1.0)
Monocytes Relative: 7 %
Neutro Abs: 4.9 10*3/uL (ref 1.7–7.7)
Neutrophils Relative %: 54 %
Platelets: 211 10*3/uL (ref 150–400)
RBC: 4.87 MIL/uL (ref 4.22–5.81)
RDW: 13.2 % (ref 11.5–15.5)
WBC: 9 10*3/uL (ref 4.0–10.5)
nRBC: 0 % (ref 0.0–0.2)

## 2022-07-26 LAB — COMPREHENSIVE METABOLIC PANEL
ALT: 29 U/L (ref 0–44)
AST: 29 U/L (ref 15–41)
Albumin: 4.1 g/dL (ref 3.5–5.0)
Alkaline Phosphatase: 80 U/L (ref 38–126)
Anion gap: 10 (ref 5–15)
BUN: 7 mg/dL (ref 6–20)
CO2: 21 mmol/L — ABNORMAL LOW (ref 22–32)
Calcium: 8.6 mg/dL — ABNORMAL LOW (ref 8.9–10.3)
Chloride: 108 mmol/L (ref 98–111)
Creatinine, Ser: 0.68 mg/dL (ref 0.61–1.24)
GFR, Estimated: >60 mL/min (ref >60)
Glucose, Bld: 101 mg/dL — ABNORMAL HIGH (ref 70–99)
Potassium: 3.5 mmol/L (ref 3.5–5.1)
Sodium: 139 mmol/L (ref 135–145)
Total Bilirubin: 0.6 mg/dL (ref 0.3–1.2)
Total Protein: 7.7 g/dL (ref 6.5–8.1)

## 2022-07-26 LAB — URINE DRUG SCREEN, QUALITATIVE (ARMC ONLY)
Amphetamines, Ur Screen: NOT DETECTED
Barbiturates, Ur Screen: NOT DETECTED
Benzodiazepine, Ur Scrn: NOT DETECTED
Cannabinoid 50 Ng, Ur ~~LOC~~: NOT DETECTED
Cocaine Metabolite,Ur ~~LOC~~: NOT DETECTED
MDMA (Ecstasy)Ur Screen: NOT DETECTED
Methadone Scn, Ur: NOT DETECTED
Opiate, Ur Screen: NOT DETECTED
Phencyclidine (PCP) Ur S: NOT DETECTED
Tricyclic, Ur Screen: NOT DETECTED

## 2022-07-26 LAB — ETHANOL: Alcohol, Ethyl (B): 250 mg/dL — ABNORMAL HIGH (ref <10)

## 2022-07-26 LAB — ACETAMINOPHEN LEVEL: Acetaminophen (Tylenol), Serum: <10 ug/mL — ABNORMAL LOW (ref 10–30)

## 2022-07-26 LAB — SALICYLATE LEVEL: Salicylate Lvl: <7 mg/dL — ABNORMAL LOW (ref 7.0–30.0)

## 2022-07-26 MED ORDER — HALOPERIDOL LACTATE 5 MG/ML IJ SOLN: 5.0000 mg | Freq: Once | INTRAMUSCULAR | Status: AC

## 2022-07-26 MED ORDER — LORAZEPAM 2 MG/ML IJ SOLN: 2.0000 mg | Freq: Once | INTRAMUSCULAR | Status: AC

## 2022-07-26 MED ORDER — HALOPERIDOL LACTATE 5 MG/ML IJ SOLN
INTRAMUSCULAR | Status: AC
  Administered 2022-07-26: 5 mg via INTRAMUSCULAR
  Filled 2022-07-26: qty 1

## 2022-07-26 MED ORDER — LORAZEPAM 2 MG/ML IJ SOLN
INTRAMUSCULAR | Status: AC
  Administered 2022-07-26: 2 mg via INTRAMUSCULAR
  Filled 2022-07-26: qty 1

## 2022-07-26 NOTE — ED Notes (Signed)
ivc/psych consult ordered/pending..'@2'$ :27am

## 2022-07-26 NOTE — ED Notes (Signed)
ivc/consult pending/re-assess when pt awakens.Jesse Obrien

## 2022-07-26 NOTE — Consult Note (Signed)
Patient is speaking in clear, coherent sentences. Denies SI/HI/AVH. Calm and cooperative.No unsafe behavior observed. No longer meets criteria for IVC and is asking for discharge. IVC released.  Sherlon Handing, PMHNP

## 2022-07-26 NOTE — ED Triage Notes (Signed)
Patient arrived by [facility] from home.  Per EMS patient has been drinking and had a verbal altercation with wife and then took a razor blade and cut self on his neck.  Bleeding controlled at this time.  Appears to be superficial with dried blood on the area.  Patient states he drank a 12 pack which was not enough.  Patient denies getting into verbal altercation  with wife. Patient states he wants help to stop drinking and go to a rehab.  Patient dressed out by this Probation officer and ED tech, belongings bagged in patient belongings bag and labeled with patient info.  Items as follow  1- jacket 1- t-shirt 1- jeans 1 pair socks 1- watch

## 2022-07-26 NOTE — ED Notes (Signed)
Hospital meal provided, pt tolerated w/o complaints.  Waste discarded appropriately This NT offered shower Pt denied shower.

## 2022-07-26 NOTE — ED Notes (Signed)
Lunch tray given to pt.

## 2022-07-26 NOTE — ED Provider Notes (Signed)
[facility] Provider Note    Event Date/Time   First MD Initiated Contact with Patient 07/26/22 0154     (approximate)   History   Alcohol Intoxication   HPI  Jesse Obrien is a 62 y.o. male with history of alcohol abuse and chronic hip pain who presents with suicidal ideation and alcohol intoxication.  The patient states that he tried to hurt himself by cutting his neck with a razor.  He admits to drinking and denies drugs.  However, he is uncooperative and unwilling to provide any further history instead yelling at me and other staff.  I reviewed the past medical records.  The patient was most recently seen in the ED in May 2023 and per the hospitalist H&P was initially admitted for hyponatremia.  However the patient then signed out [place].     Physical Exam   Triage Vital Signs: ED Triage Vitals  Enc Vitals Group     BP 07/26/22 0152 (!) 135/92     Pulse Rate 07/26/22 0152 95     Resp 07/26/22 0152 20     Temp 07/26/22 0152 98.3 F (36.8 C)     Temp Source 07/26/22 0152 Oral     SpO2 07/26/22 0152 96 %     Weight 07/26/22 0210 205 lb (93 kg)     Height 07/26/22 0210 '5\' 10"'$  (1.778 m)     Head Circumference --      Peak Flow --      Pain Score 07/26/22 0210 0     Pain Loc --      Pain Edu? --      Excl. in [place]? --     Most recent vital signs: Vitals:   07/26/22 0152 07/26/22 0157  BP: (!) 135/92   Pulse: 95   Resp: 20   Temp: 98.3 F (36.8 C)   SpO2: 96% 96%     General: Alert and oriented, agitated. CV:  Good peripheral perfusion.  Resp:  Normal effort.  Abd:  No distention.  Other:  Linear superficial abrasion to anterior neck.  No other visible trauma.  Motor intact in all extremities.  Normal gait.   ED Results / Procedures / Treatments   Labs (all labs ordered are listed, but only abnormal results are displayed) Labs Reviewed  COMPREHENSIVE METABOLIC PANEL - Abnormal; Notable for the following components:      Result  Value   CO2 21 (*)    Glucose, Bld 101 (*)    Calcium 8.6 (*)    All other components within normal limits  ETHANOL - Abnormal; Notable for the following components:   Alcohol, Ethyl (B) 250 (*)    All other components within normal limits  ACETAMINOPHEN LEVEL - Abnormal; Notable for the following components:   Acetaminophen (Tylenol), Serum <10 (*)    All other components within normal limits  SALICYLATE LEVEL - Abnormal; Notable for the following components:   Salicylate Lvl <6.6 (*)    All other components within normal limits  CBC WITH DIFFERENTIAL/PLATELET  URINE DRUG SCREEN, QUALITATIVE ([facility] ONLY)     EKG     RADIOLOGY    PROCEDURES:  Critical Care performed: No  Procedures   MEDICATIONS ORDERED IN ED: Medications  haloperidol lactate (HALDOL) injection 5 mg (5 mg Intramuscular Given 07/26/22 0231)  LORazepam (ATIVAN) injection 2 mg (2 mg Intramuscular Given 07/26/22 0230)     IMPRESSION / MDM / ASSESSMENT AND PLAN / ED COURSE  I reviewed the triage vital signs and the nursing notes.  62 year old male with PMH as noted above presents with suicidal ideation.  The vital signs are normal.  Physical exam reveals an abrasion to the anterior neck as a result of self-harm.  The patient has no other injuries.  He is agitated, combative, and actively threatening to assault staff.  Differential diagnosis includes, but is not limited to, substance-induced mood disorder, major depressive disorder.  I have ordered lab workup for medical clearance as well as psychiatry and TTS consult.  Given the patient's acute agitation, threats to staff, and acute attempt at self-harm, he demonstrates danger to self and others, and I have ordered Haldol and Ativan for sedation and place patient under involuntary commitment.  Patient's presentation is most consistent with acute presentation with potential threat to life or bodily function.  The patient has been placed in psychiatric  observation due to the need to provide a safe environment for the patient while obtaining psychiatric consultation and evaluation, as well as ongoing medical and medication management to treat the patient's condition.  The patient has been placed under full IVC at this time.   ----------------------------------------- 6:46 AM on 07/26/2022 -----------------------------------------  Ethanol level was 250.  Acetaminophen and salicylate are negative.  UDS is negative.  The patient is pending psychiatry evaluation.  He will be signed out to the oncoming ED physician at 7 AM.   FINAL CLINICAL IMPRESSION(S) / ED DIAGNOSES   Final diagnoses:  Alcoholic intoxication without complication ([place])  Suicidal ideation     Rx / DC Orders   ED Discharge Orders     None        Note:  This document was prepared using Dragon voice recognition software and may include unintentional dictation errors.    Arta Silence, MD 07/26/22 [phone]

## 2022-07-26 NOTE — Discharge Instructions (Signed)
No driving today or while using alcohol.  You have been seen in the Emergency Department (ED) today for a psychiatric complaint.  You have been evaluated by psychiatry and we believe you are safe to be discharged from the hospital.    Please return to the ED immediately if you have ANY thoughts of hurting yourself or anyone else, so that we may help you.  Please avoid alcohol and drug use.  Follow up with your doctor and/or therapist as soon as possible regarding today's ED visit.   Please follow up any other recommendations and clinic appointments provided by the psychiatry team that saw you in the Emergency Department.

## 2022-07-26 NOTE — ED Provider Notes (Signed)
Patient's IVC has been rescinded by psychiatry.  I have discussed this case with the Barbaraann Share our nurse practitioner and they are recommending discharge with outpatient follow-up.  Patient currently alert oriented normal hemodynamics.  He does not wish to stay any longer, comfortable with plan to follow-up with [facility].  Not actively suicidal     Delman Kitten, MD 07/26/22 [phone]

## 2022-07-26 NOTE — ED Notes (Signed)
VOLUNTARY as IVC rescinded by NP [facility]

## 2022-07-26 NOTE — ED Notes (Signed)
Patient continues to Microbiologist and other staff "Bitch".  Patient moved to room 22 due to screaming out profanities.

## 2022-07-26 NOTE — BH Assessment (Signed)
Comprehensive Clinical Assessment (CCA) Screening, Triage and Referral Note  07/26/2022 Jesse Obrien [id number]  Jesse D. Read, 62 year old male who presents to [facility] ED involuntarily for treatment. Per triage note, Patient arrived by [facility] from home.  Per EMS patient has been drinking and had a verbal altercation with wife and then took a razor blade and cut self on his neck.  Bleeding controlled at this time.  Appears to be superficial with dried blood on the area.  Patient states he drank a 12 pack which was not enough.  Patient denies getting into verbal altercation with wife. Patient states he wants help to stop drinking and go to rehab.  Patient dressed out by this Probation officer and ED tech, belongings bagged in patient belongings bag and labeled with patient info.   During TTS assessment pt presents alert and oriented x 4, restless but cooperative, and mood-congruent with affect. The pt does not appear to be responding to internal or external stimuli. Neither is the pt presenting with any delusional thinking. Pt verified the information provided to triage RN.   Pt identifies his main complaint to be that he has chronic hip pain and drank a 12 pack of beer. Patient reports he tried to hurt himself but has no intention of wanting to die. Patient denies allegations of getting into an argument with his wife. Patient reports he does not see alcohol as an issue; although he has been to rehab years ago. Pt denies current SI/HI/AH/VH. Pt contracts for safety.   Per Christal, NP, pt does not meet criteria for inpatient psychiatric admission.  Chief Complaint:  Chief Complaint  Patient presents with   Alcohol Intoxication   Visit Diagnosis: Alcohol abuse with intoxication  Patient Reported Information How did you hear about [place]? Self  What Is the Reason for Your Visit/Call Today? Patient was brought to the ED after calling to police for help.  How Long Has This Been Causing You Problems? >  than 6 months  What Do You Feel Would Help You the Most Today? Alcohol or Drug Use Treatment   Have You Recently Had Any Thoughts About Hurting Yourself? No  Are You Planning to Commit Suicide/Harm Yourself At This time? No   Have you Recently Had Thoughts About [place]? No  Are You Planning to Harm Someone at This Time? No  Explanation: No data recorded  Have You Used Any Alcohol or Drugs in the Past 24 Hours? Yes  How Long Ago Did You Use Drugs or Alcohol? No data recorded What Did You Use and How Much? Alcohol- 12 pack of beer   Do You Currently Have a Therapist/Psychiatrist? No  Name of Therapist/Psychiatrist: No data recorded  Have You Been Recently Discharged From Any Office Practice or Programs? No  Explanation of Discharge From Practice/Program: No data recorded   CCA Screening Triage Referral Assessment Type of Contact: Face-to-Face  Telemedicine Service Delivery:   Is this Initial or Reassessment?   Date Telepsych consult ordered in [facility]:    Time Telepsych consult ordered in [facility]:    Location of Assessment: [facility] ED  Provider Location: [facility] ED    Collateral Involvement: None provided   Does Patient Have a [place]? No data recorded Name and Contact of Legal Guardian: No data recorded If Minor and Not Living with Parent(s), Who has Custody? n/a  Is CPS involved or ever been involved? Never  Is APS involved or ever been involved? Never  Patient Determined To Be At Risk for Harm To Self or Others Based on Review of Patient Reported Information or Presenting Complaint? No  Method: No data recorded Availability of Means: No data recorded Intent: No data recorded Notification Required: No data recorded Additional Information for Danger to Others Potential: No data recorded Additional Comments for Danger to Others Potential: No data recorded Are There Guns or Other Weapons in Your Home? No data recorded Types of  Guns/Weapons: No data recorded Are These Weapons Safely Secured?                            No data recorded Who Could Verify You Are Able To Have These Secured: No data recorded Do You Have any Outstanding Charges, Pending Court Dates, Parole/Probation? No data recorded Contacted To Inform of Risk of Harm To Self or Others: No data recorded  Does Patient Present under Involuntary Commitment? No    County of Residence: [place]   Patient Currently Receiving the Following Services: Not Receiving Services   Determination of Need: Emergent (2 hours)   Options For Referral: Chemical Dependency Intensive Outpatient Therapy (CDIOP); ED Visit   Discharge Disposition:     Eula Fried, Counselor, LCAS-A

## 2022-07-26 NOTE — Consult Note (Addendum)
[facility] Consult   Reason for Consult:  Suicidal Ideation and Alcohol Intoxication.  Referring Physician:  Dr. Cherylann Banas Patient Identification: Jesse Obrien MRN:  [id number] Principal Diagnosis: Alcohol abuse with intoxication [facility]) Diagnosis:  Principal Problem:   Alcohol abuse with intoxication ([place]) Active Problems:   Suicidal ideation   Total Time spent with patient: 45 minutes  Subjective: "I cut myself".  Jesse Obrien is a 62 y.o. male presents with suicidal ideation, alcohol intoxication, and a history of self-harm. Exhibits poor insight and judgment regarding his alcohol use and need for treatment. Chronic hip pain appears to be a contributing factor to his current mental state.  HPI:  Jesse Obrien is a 61 year old male with a history of alcohol abuse and chronic hip pain presenting to the emergency department after attempting self-harm by cutting his neck with a razor. The patient arrived to the ED via EMS from home with alcohol intoxication.   On evaluation, the patient is seen lying in bed. He admits to consuming a 12 pack of beer yesterday; BAL 250. He reports intentional self-harm without a desire to die, triggered by ongoing hip pain. The patient denies any verbal altercation with his wife and reports he called the police for help. The patient currently denies suicidal or homicidal ideations. He denies drug use. Patient reports he has been to rehabilitation before for substance abuse but currently denies alcohol is a problem and does not wish to seek rehab. Patient informed that his alcohol abuse is causing potentially life-threatening problems and strongly recommend he seek treatment. Patient denies auditory or visual hallucinations, and does not appear to be responding to internal or external stimuli. He is currently irritable and states, "I am tired of talking right now", and pulls the covers over his head.   Past Psychiatric History: Denies any formal  mental health history, although records indicate a previous psychiatric hospitalization in April 2017 due to the same, alcohol intoxication with suicidal ideation. Patient was prescribed citalopram 20 mg, diazepam 5 mg every 8 hours as needed for anxiety, and trazodone 150 mg on discharge  Risk to Self:   Risk to Others:   Prior Inpatient Therapy:   Prior Outpatient Therapy:    Past Medical History:  Past Medical History:  Diagnosis Date   Alcohol abuse    DJD (degenerative joint disease)    Hypertension 01/02/2015   History reviewed. No pertinent surgical history. Family History: History reviewed. No pertinent family history. Family Psychiatric  History: History reviewed. No pertinent family psychiatric history.  Social History:  Social History   Substance and Sexual Activity  Alcohol Use Yes   Comment: 24pk beer     Social History   Substance and Sexual Activity  Drug Use No    Social History   Socioeconomic History   Marital status: Married    Spouse name: Not on file   Number of children: Not on file   Years of education: Not on file   Highest education level: Not on file  Occupational History   Not on file  Tobacco Use   Smoking status: Every Day    Years: 15.00    Types: Cigarettes   Smokeless tobacco: Never  Vaping Use   Vaping Use: Never used  Substance and Sexual Activity   Alcohol use: Yes    Comment: 24pk beer   Drug use: No   Sexual activity: Yes    Birth control/protection: Condom  Other Topics Concern   Not on  file  Social History Narrative   Not on file   Social Determinants of Health   Financial Resource Strain: Not on file  Food Insecurity: Not on file  Transportation Needs: Not on file  Physical Activity: Not on file  Stress: Not on file  Social Connections: Not on file   Additional Social History:    Allergies:  No Known Allergies  Labs:  Results for orders placed or performed during the hospital encounter of 07/26/22 (from the  past 48 hour(s))  Comprehensive metabolic panel     Status: Abnormal   Collection Time: 07/26/22  1:56 AM  Result Value Ref Range   Sodium 139 135 - 145 mmol/L   Potassium 3.5 3.5 - 5.1 mmol/L   Chloride 108 98 - 111 mmol/L   CO2 21 (L) 22 - 32 mmol/L   Glucose, Bld 101 (H) 70 - 99 mg/dL    Comment: Glucose reference range applies only to samples taken after fasting for at least 8 hours.   BUN 7 6 - 20 mg/dL   Creatinine, Ser 0.68 0.61 - 1.24 mg/dL   Calcium 8.6 (L) 8.9 - 10.3 mg/dL   Total Protein 7.7 6.5 - 8.1 g/dL   Albumin 4.1 3.5 - 5.0 g/dL   AST 29 15 - 41 U/L   ALT 29 0 - 44 U/L   Alkaline Phosphatase 80 38 - 126 U/L   Total Bilirubin 0.6 0.3 - 1.2 mg/dL   GFR, Estimated >60 >60 mL/min    Comment: (NOTE) Calculated using the CKD-EPI Creatinine Equation (2021)    Anion gap 10 5 - 15    Comment: Performed at [facility], [address] [place], [place] [address]  Ethanol     Status: Abnormal   Collection Time: 07/26/22  1:56 AM  Result Value Ref Range   Alcohol, Ethyl (B) 250 (H) <10 mg/dL    Comment: (NOTE) Lowest detectable limit for serum alcohol is 10 mg/dL.  For medical purposes only. Performed at [facility], [address] [place], [place] [address]   Acetaminophen level     Status: Abnormal   Collection Time: 07/26/22  1:56 AM  Result Value Ref Range   Acetaminophen (Tylenol), Serum <10 (L) 10 - 30 ug/mL    Comment: (NOTE) Therapeutic concentrations vary significantly. A range of 10-30 ug/mL  may be an effective concentration for many patients. However, some  are best treated at concentrations outside of this range. Acetaminophen concentrations >150 ug/mL at 4 hours after ingestion  and >50 ug/mL at 12 hours after ingestion are often associated with  toxic reactions.  Performed at [facility], [address] [place], [place] [address]   Salicylate level     Status: Abnormal   Collection Time: 07/26/22  1:56 AM   Result Value Ref Range   Salicylate Lvl <6.6 (L) 7.0 - 30.0 mg/dL    Comment: Performed at [facility], [address] [place], [place] [address]  CBC with Differential     Status: None   Collection Time: 07/26/22  1:56 AM  Result Value Ref Range   WBC 9.0 4.0 - 10.5 K/uL   RBC 4.87 4.22 - 5.81 MIL/uL   Hemoglobin 15.6 13.0 - 17.0 g/dL   HCT 47.0 39.0 - 52.0 %   MCV 96.5 80.0 - 100.0 fL   MCH 32.0 26.0 - 34.0 pg   MCHC 33.2 30.0 - 36.0 g/dL   RDW 13.2 11.5 - 15.5 %  Platelets 211 150 - 400 K/uL   nRBC 0.0 0.0 - 0.2 %   Neutrophils Relative % 54 %   Neutro Abs 4.9 1.7 - 7.7 K/uL   Lymphocytes Relative 36 %   Lymphs Abs 3.2 0.7 - 4.0 K/uL   Monocytes Relative 7 %   Monocytes Absolute 0.6 0.1 - 1.0 K/uL   Eosinophils Relative 2 %   Eosinophils Absolute 0.2 0.0 - 0.5 K/uL   Basophils Relative 0 %   Basophils Absolute 0.0 0.0 - 0.1 K/uL   Immature Granulocytes 1 %   Abs Immature Granulocytes 0.07 0.00 - 0.07 K/uL    Comment: Performed at [facility], [address], [place], [place] [address]  Urine Drug Screen, Qualitative     Status: None   Collection Time: 07/26/22  1:56 AM  Result Value Ref Range   Tricyclic, Ur Screen NONE DETECTED NONE DETECTED   Amphetamines, Ur Screen NONE DETECTED NONE DETECTED   MDMA (Ecstasy)Ur Screen NONE DETECTED NONE DETECTED   Cocaine Metabolite,Ur [place] NONE DETECTED NONE DETECTED   Opiate, Ur Screen NONE DETECTED NONE DETECTED   Phencyclidine (PCP) Ur S NONE DETECTED NONE DETECTED   Cannabinoid 50 Ng, Ur [place] NONE DETECTED NONE DETECTED   Barbiturates, Ur Screen NONE DETECTED NONE DETECTED   Benzodiazepine, Ur Scrn NONE DETECTED NONE DETECTED   Methadone Scn, Ur NONE DETECTED NONE DETECTED    Comment: (NOTE) Tricyclics + metabolites, urine    Cutoff 1000 ng/mL Amphetamines + metabolites, urine  Cutoff 1000 ng/mL MDMA (Ecstasy), urine              Cutoff 500 ng/mL Cocaine Metabolite, urine          Cutoff 300 ng/mL Opiate  + metabolites, urine        Cutoff 300 ng/mL Phencyclidine (PCP), urine         Cutoff 25 ng/mL Cannabinoid, urine                 Cutoff 50 ng/mL Barbiturates + metabolites, urine  Cutoff 200 ng/mL Benzodiazepine, urine              Cutoff 200 ng/mL Methadone, urine                   Cutoff 300 ng/mL  The urine drug screen provides only a preliminary, unconfirmed analytical test result and should not be used for non-medical purposes. Clinical consideration and professional judgment should be applied to any positive drug screen result due to possible interfering substances. A more specific alternate chemical method must be used in order to obtain a confirmed analytical result. Gas chromatography / mass spectrometry (GC/MS) is the preferred confirm atory method. Performed at [facility], [address] [place], [place] [address]     No current facility-administered medications for this encounter.   Current Outpatient Medications  Medication Sig Dispense Refill   albuterol (VENTOLIN HFA) 108 (90 Base) MCG/ACT inhaler Inhale 2 puffs into the lungs every 6 (six) hours as needed for wheezing or shortness of breath. 18 g 1   citalopram (CELEXA) 20 MG tablet Take 1 tablet (20 mg total) by mouth daily. 30 tablet 2   methylPREDNISolone (MEDROL DOSEPAK) 4 MG TBPK tablet Take as prescribed 21 tablet 0   multivitamin (ONE-A-DAY MEN'S) TABS tablet Take 1 tablet by mouth daily. (Patient not taking: Reported on 04/12/2021) 30 tablet 3   pantoprazole (PROTONIX) 40 MG tablet Take 1 tablet (40 mg total) by mouth daily. (  Patient not taking: Reported on 04/12/2021) 30 tablet 2   thiamine 100 MG tablet Take 1 tablet (100 mg total) by mouth daily. (Patient not taking: Reported on 04/12/2021) 30 tablet 3    Musculoskeletal: Strength & Muscle Tone: within normal limits Gait & Station: normal Patient leans: N/A            Psychiatric Specialty Exam:  Presentation  General  Appearance:  Appropriate for Environment  Eye Contact: Fair  Speech: Clear and Coherent  Speech Volume: Normal  Handedness:Right   Mood and Affect  Mood: Irritable  Affect: Constricted   Thought Process  Thought Processes: Coherent  Descriptions of Associations:Intact  Orientation:Full (Time, Place and Person)  Thought Content:Logical  History of Schizophrenia/Schizoaffective disorder:No  Duration of Psychotic Symptoms:No data recorded Hallucinations:Hallucinations: None  Ideas of Reference:None  Suicidal Thoughts:Suicidal Thoughts: No  Homicidal Thoughts:Homicidal Thoughts: No   Sensorium  Memory: Immediate Good  Judgment: Fair  Insight: Fair   Community education officer  Concentration: Good  Attention Span: Good  Recall: Good  Fund of Knowledge: Good  Language: Good   Psychomotor Activity  Psychomotor Activity:Psychomotor Activity: Normal   Assets  Assets: Housing; Financial Resources/Insurance   Sleep  Sleep:Sleep: Good   Physical Exam: Physical Exam Vitals and nursing note reviewed.  HENT:     Head: Normocephalic.     Nose: Nose normal.  Pulmonary:     Effort: Pulmonary effort is normal.  Musculoskeletal:        General: Normal range of motion.     Cervical back: Normal range of motion.  Skin:    General: Skin is dry.  Neurological:     General: No focal deficit present.     Mental Status: He is alert.    Review of Systems  Constitutional: Negative.   HENT: Negative.    Respiratory: Negative.    Musculoskeletal: Negative.   Neurological: Negative.   Psychiatric/Behavioral:  Positive for substance abuse. Negative for hallucinations and suicidal ideas.    Blood pressure 120/66, pulse 70, temperature 98.2 F (36.8 C), temperature source Oral, resp. rate 17, height '5\' 10"'$  (1.778 m), weight 93 kg, SpO2 94 %. Body mass index is 29.41 kg/m.  Treatment Plan Summary: Plan Patient does not meet criteria for  psychiatric inpatient hospitalization.    Patient currently denies alcohol is a problem and does not wish to seek rehab. Reviewed with Dr. Suzzette Righter.   Disposition: No evidence of imminent risk to self or others at present.   Patient does not meet criteria for psychiatric inpatient admission. Supportive therapy provided about ongoing stressors. Discussed crisis plan, support from social network, calling 911, coming to the Emergency Department, and calling Suicide Hotline.  Ronny Flurry, NP 07/26/2022 12:48 PM

## 2022-07-26 NOTE — ED Notes (Signed)
Pt given phone to call ride home. Belongings bag 1/1 returned to patient at time of discharge

## 2022-07-26 NOTE — BH Assessment (Signed)
Pt unable to participate in the assessment due to receiving agitation protocol. Psych team to follow up when pt awakens.

## 2022-07-26 NOTE — ED Notes (Signed)
E-signature not working at this time. Pt verbalized understanding of D/C instructions, prescriptions and follow up care with no further questions at this time. Pt in NAD and ambulatory at time of D/C.  

## 2022-07-26 NOTE — ED Notes (Signed)
Patient calling this Probation officer bitch due to Probation officer asking patient triage questions.  Patient redirected.

## 2023-05-11 ENCOUNTER — Other Ambulatory Visit: Payer: Self-pay

## 2023-05-11 ENCOUNTER — Emergency Department
Admission: EM | Admit: 2023-05-11 | Discharge: 2023-05-12 | Disposition: A | Discharge status: Home / Self Care | Attending: Emergency Medicine | Admitting: Emergency Medicine

## 2023-05-11 DIAGNOSIS — M25552 Pain in left hip: Secondary | ICD-10-CM | POA: Insufficient documentation

## 2023-05-11 DIAGNOSIS — I10 Essential (primary) hypertension: Secondary | ICD-10-CM | POA: Diagnosis not present

## 2023-05-11 DIAGNOSIS — Y908 Blood alcohol level of 240 mg/100 ml or more: Secondary | ICD-10-CM | POA: Insufficient documentation

## 2023-05-11 DIAGNOSIS — M25551 Pain in right hip: Secondary | ICD-10-CM | POA: Insufficient documentation

## 2023-05-11 DIAGNOSIS — G8929 Other chronic pain: Secondary | ICD-10-CM | POA: Diagnosis not present

## 2023-05-11 DIAGNOSIS — F1010 Alcohol abuse, uncomplicated: Secondary | ICD-10-CM | POA: Diagnosis not present

## 2023-05-11 LAB — COMPREHENSIVE METABOLIC PANEL
ALT: 41 U/L (ref 0–44)
AST: 36 U/L (ref 15–41)
Albumin: 4.2 g/dL (ref 3.5–5.0)
Alkaline Phosphatase: 87 U/L (ref 38–126)
Anion gap: 11 (ref 5–15)
BUN: 7 mg/dL — ABNORMAL LOW (ref 8–23)
CO2: 21 mmol/L — ABNORMAL LOW (ref 22–32)
Calcium: 8.6 mg/dL — ABNORMAL LOW (ref 8.9–10.3)
Chloride: 105 mmol/L (ref 98–111)
Creatinine, Ser: 0.78 mg/dL (ref 0.61–1.24)
GFR, Estimated: >60 mL/min (ref >60)
Glucose, Bld: 107 mg/dL — ABNORMAL HIGH (ref 70–99)
Potassium: 3.5 mmol/L (ref 3.5–5.1)
Sodium: 137 mmol/L (ref 135–145)
Total Bilirubin: 0.3 mg/dL (ref <1.2)
Total Protein: 7.6 g/dL (ref 6.5–8.1)

## 2023-05-11 LAB — CBC
HCT: 44.7 % (ref 39.0–52.0)
Hemoglobin: 15.2 g/dL (ref 13.0–17.0)
MCH: 32.5 pg (ref 26.0–34.0)
MCHC: 34 g/dL (ref 30.0–36.0)
MCV: 95.7 fL (ref 80.0–100.0)
Platelets: 195 10*3/uL (ref 150–400)
RBC: 4.67 MIL/uL (ref 4.22–5.81)
RDW: 12.9 % (ref 11.5–15.5)
WBC: 10.8 10*3/uL — ABNORMAL HIGH (ref 4.0–10.5)
nRBC: 0 % (ref 0.0–0.2)

## 2023-05-11 LAB — ACETAMINOPHEN LEVEL: Acetaminophen (Tylenol), Serum: <10 ug/mL — ABNORMAL LOW (ref 10–30)

## 2023-05-11 LAB — ETHANOL: Alcohol, Ethyl (B): 284 mg/dL — ABNORMAL HIGH (ref <10)

## 2023-05-11 LAB — SALICYLATE LEVEL: Salicylate Lvl: <7 mg/dL — ABNORMAL LOW (ref 7.0–30.0)

## 2023-05-11 MED ORDER — METHYLPREDNISOLONE 4 MG PO TBPK: ORAL_TABLET | ORAL | 0 refills | Status: DC

## 2023-05-11 MED ORDER — ACETAMINOPHEN 325 MG PO TABS
650.0000 mg | ORAL_TABLET | Freq: Once | ORAL | Status: AC
  Administered 2023-05-11: 650 mg via ORAL
  Filled 2023-05-11: qty 2

## 2023-05-11 MED ORDER — ACETAMINOPHEN ER 650 MG PO TBCR: 650.0000 mg | EXTENDED_RELEASE_TABLET | Freq: Three times a day (TID) | ORAL | 0 refills | Status: AC | PRN

## 2023-05-11 MED ORDER — KETOROLAC TROMETHAMINE 60 MG/2ML IM SOLN
60.0000 mg | Freq: Once | INTRAMUSCULAR | Status: AC
  Administered 2023-05-11: 60 mg via INTRAMUSCULAR
  Filled 2023-05-11: qty 2

## 2023-05-11 NOTE — ED Provider Notes (Signed)
[facility] Provider Note    Event Date/Time   First MD Initiated Contact with Patient 05/11/23 2228     (approximate)   History   Psychiatric Evaluation   HPI  Jesse Obrien is a 62 y.o. male   with past medical history of hypertension, chronic alcohol abuse, here with diffuse pain.  The patient states that he recently relapsed drinking alcohol because of severe pain in his hips and back.  This is a chronic issue for which he has been seen multiple times.  He was clean since January and just relapsed.  He states that he has seen multiple orthopedics for his hips.  He states he is fed up with the degree of pain in his hips and states that is hard to live with pain, though denies any overt suicidal homicidal patient on my assessment.  He states that he just is tired of being in pain.  Denies any numbness or weakness.  No recent falls.      Physical Exam   Triage Vital Signs: ED Triage Vitals  Encounter Vitals Group     BP 05/11/23 2233 (!) 126/92     Systolic BP Percentile --      Diastolic BP Percentile --      Pulse Rate 05/11/23 2233 95     Resp 05/11/23 2233 19     Temp 05/11/23 2233 98.5 F (36.9 C)     Temp Source 05/11/23 2233 Oral     SpO2 05/11/23 2233 93 %     Weight 05/11/23 2210 205 lb (93 kg)     Height 05/11/23 2210 5\' 11"  (1.803 m)     Head Circumference --      Peak Flow --      Pain Score 05/11/23 2209 10     Pain Loc --      Pain Education --      Exclude from Growth Chart --     Most recent vital signs: Vitals:   05/11/23 2233  BP: (!) 126/92  Pulse: 95  Resp: 19  Temp: 98.5 F (36.9 C)  SpO2: 93%     General: Awake, no distress.  CV:  Good peripheral perfusion.  Resp:  Normal work of breathing.  Abd:  No distention.  Other:  Speech initially slightly slurred, though otherwise alert, oriented x 4.  Cranial nerves intact.  Strength 5 bilateral upper and lower extremities.  Normal sensation to light touch bilateral  lower extremities.  Quad reflexes 2+ and symmetric.  Mild diffuse tenderness throughout the bilateral hips and lower back, with no focal tenderness.  No joint warmth.  Denies any SI, HI, and is not hallucinating.   ED Results / Procedures / Treatments   Labs (all labs ordered are listed, but only abnormal results are displayed) Labs Reviewed  COMPREHENSIVE METABOLIC PANEL - Abnormal; Notable for the following components:      Result Value   CO2 21 (*)    Glucose, Bld 107 (*)    BUN 7 (*)    Calcium 8.6 (*)    All other components within normal limits  ETHANOL - Abnormal; Notable for the following components:   Alcohol, Ethyl (B) 284 (*)    All other components within normal limits  SALICYLATE LEVEL - Abnormal; Notable for the following components:   Salicylate Lvl <7.0 (*)    All other components within normal limits  ACETAMINOPHEN LEVEL - Abnormal; Notable for the following components:   Acetaminophen (  Tylenol), Serum <10 (*)    All other components within normal limits  CBC - Abnormal; Notable for the following components:   WBC 10.8 (*)    All other components within normal limits  URINE DRUG SCREEN, QUALITATIVE ([facility] ONLY)     EKG    RADIOLOGY    I also independently reviewed and agree with radiologist interpretations.   PROCEDURES:  Critical Care performed: No   MEDICATIONS ORDERED IN ED: Medications  ketorolac (TORADOL) injection 60 mg (60 mg Intramuscular Given 05/11/23 2334)  acetaminophen (TYLENOL) tablet 650 mg (650 mg Oral Given 05/11/23 2334)     IMPRESSION / MDM / ASSESSMENT AND PLAN / ED COURSE  I reviewed the triage vital signs and the nursing notes.                              Differential diagnosis includes, but is not limited to, alcohol intoxication, depression, chronic pain, malingering, adjustment disorder, metabolic encephalopathy  Patient's presentation is most consistent with acute presentation with potential threat to life or bodily  function.  62 year old male with history of chronic pain, alcohol dependence, here with acute on chronic pain.  Patient made transient statements about not wanting to live anymore with pain, but I suspect this is more so secondary to his chronic pain and medical condition, and he strongly denies any SI on my direct assessment.  He confirmed this with nursing, and has a wife who can watch him for safety.  Regarding his chronic pain, this does seem to be chronic issue related to osteoarthritis.  Denies any significant recent falls.  He is neurovascularly intact distally.  His screening lab work is very reassuring.  Will give him a trial of anti-inflammatories, discharged with supportive care and outpatient follow-up.  No part emergent medical condition.   FINAL CLINICAL IMPRESSION(S) / ED DIAGNOSES   Final diagnoses:  Chronic pain of both hips  Alcohol abuse     Rx / DC Orders   ED Discharge Orders          Ordered    acetaminophen (TYLENOL 8 HOUR) 650 MG CR tablet  Every 8 hours PRN        05/11/23 2357    methylPREDNISolone (MEDROL DOSEPAK) 4 MG TBPK tablet        05/11/23 2357             Note:  This document was prepared using Dragon voice recognition software and may include unintentional dictation errors.   Shaune Pollack, MD 05/11/23 [phone]

## 2023-05-11 NOTE — ED Notes (Signed)
Pts wife called to come pick pt up at this time

## 2023-05-11 NOTE — ED Notes (Signed)
ED Provider at bedside. 

## 2023-05-11 NOTE — ED Triage Notes (Addendum)
Pt to ED via EMS from home, ems reports being called out for chronic back pain, on arrival to pts home PD was with pt due to hx of erratic behavior and psych hx per ems. Pt calm and cooperative on arrival to ER, pt reports drinking "at least a 12 pack of beer" tonight. Pt appears to be intoxicated. Pt reports he drinks the beer to help with his chronic back and bilateral hip pain. Pt denies specific plan but states "its hard to live with the pain" Pt denies any new injury but reports frequent falls at home. Pt is VOL

## 2023-11-03 ENCOUNTER — Encounter: Payer: Self-pay | Admitting: Emergency Medicine

## 2023-11-03 ENCOUNTER — Emergency Department

## 2023-11-03 ENCOUNTER — Emergency Department
Admission: EM | Admit: 2023-11-03 | Discharge: 2023-11-03 | Disposition: A | Discharge status: Home / Self Care | Attending: Emergency Medicine | Admitting: Emergency Medicine

## 2023-11-03 DIAGNOSIS — W1830XA Fall on same level, unspecified, initial encounter: Secondary | ICD-10-CM | POA: Insufficient documentation

## 2023-11-03 DIAGNOSIS — F10129 Alcohol abuse with intoxication, unspecified: Secondary | ICD-10-CM | POA: Diagnosis not present

## 2023-11-03 DIAGNOSIS — I10 Essential (primary) hypertension: Secondary | ICD-10-CM | POA: Insufficient documentation

## 2023-11-03 DIAGNOSIS — F10920 Alcohol use, unspecified with intoxication, uncomplicated: Secondary | ICD-10-CM

## 2023-11-03 DIAGNOSIS — W19XXXA Unspecified fall, initial encounter: Secondary | ICD-10-CM

## 2023-11-03 DIAGNOSIS — S20211A Contusion of right front wall of thorax, initial encounter: Secondary | ICD-10-CM | POA: Diagnosis not present

## 2023-11-03 DIAGNOSIS — R0781 Pleurodynia: Secondary | ICD-10-CM | POA: Diagnosis present

## 2023-11-03 DIAGNOSIS — M545 Low back pain, unspecified: Secondary | ICD-10-CM | POA: Diagnosis not present

## 2023-11-03 LAB — CBC WITH DIFFERENTIAL/PLATELET
Abs Immature Granulocytes: 0.05 10*3/uL (ref 0.00–0.07)
Basophils Absolute: 0 10*3/uL (ref 0.0–0.1)
Basophils Relative: 1 %
Eosinophils Absolute: 0.4 10*3/uL (ref 0.0–0.5)
Eosinophils Relative: 4 %
HCT: 42.9 % (ref 39.0–52.0)
Hemoglobin: 14.2 g/dL (ref 13.0–17.0)
Immature Granulocytes: 1 %
Lymphocytes Relative: 39 %
Lymphs Abs: 3.3 10*3/uL (ref 0.7–4.0)
MCH: 32.1 pg (ref 26.0–34.0)
MCHC: 33.1 g/dL (ref 30.0–36.0)
MCV: 96.8 fL (ref 80.0–100.0)
Monocytes Absolute: 0.8 10*3/uL (ref 0.1–1.0)
Monocytes Relative: 9 %
Neutro Abs: 4 10*3/uL (ref 1.7–7.7)
Neutrophils Relative %: 46 %
Platelets: 160 10*3/uL (ref 150–400)
RBC: 4.43 MIL/uL (ref 4.22–5.81)
RDW: 12.8 % (ref 11.5–15.5)
WBC: 8.5 10*3/uL (ref 4.0–10.5)
nRBC: 0 % (ref 0.0–0.2)

## 2023-11-03 LAB — BASIC METABOLIC PANEL WITH GFR
Anion gap: 10 (ref 5–15)
BUN: 7 mg/dL — ABNORMAL LOW (ref 8–23)
CO2: 20 mmol/L — ABNORMAL LOW (ref 22–32)
Calcium: 8.4 mg/dL — ABNORMAL LOW (ref 8.9–10.3)
Chloride: 102 mmol/L (ref 98–111)
Creatinine, Ser: 0.69 mg/dL (ref 0.61–1.24)
GFR, Estimated: >60 mL/min (ref >60)
Glucose, Bld: 109 mg/dL — ABNORMAL HIGH (ref 70–99)
Potassium: 3.3 mmol/L — ABNORMAL LOW (ref 3.5–5.1)
Sodium: 132 mmol/L — ABNORMAL LOW (ref 135–145)

## 2023-11-03 NOTE — ED Triage Notes (Signed)
 Pt BIB due to fall. PT state he drank 24 bud lights. States same is normal for him.  Pt denies any LOC before/during or after the fall. Complains of back pain and left sided rib pain.

## 2023-11-03 NOTE — Discharge Instructions (Signed)
 Follow-up with your regular doctor.  Return to the ER for new, worsening, or persistent severe back or rib pain, hip pain, weakness, inability to walk, recurrent falls or any other new or worsening symptoms that are concerning.

## 2023-11-03 NOTE — ED Provider Notes (Signed)
 [facility] Provider Note    Event Date/Time   First MD Initiated Contact with Patient 11/03/23 [phone]     (approximate)   History   Fall   HPI  Jesse Obrien is a 63 y.o. male with a history of hypertension, hyperlipidemia, and alcohol abuse who presents with right-sided rib and back pain after a fall.  The patient states that he fell like his legs gave out on him, causing him to fall from standing height.  He has fallen like this multiple times before.  He is unsure if he hit his head or passed out.  He reports pain to his right posterior ribs.  He also has some low back pain.  He endorses drinking a 24 pack of beer tonight which is typical for him.  I reviewed the past medical records but the patient was most recently seen in the ED in November of last year for diffuse pain.  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   Physical Exam   Triage Vital Signs: ED Triage Vitals  Encounter Vitals Group     BP      Systolic BP Percentile      Diastolic BP Percentile      Pulse      Resp      Temp      Temp src      SpO2      Weight      Height      Head Circumference      Peak Flow      Pain Score      Pain Loc      Pain Education      Exclude from Growth Chart     Most recent vital signs: Vitals:   11/03/23 0502  BP: 122/87  Pulse: 94  Resp: 16  Temp: 98.4 F (36.9 C)  SpO2: 95%     General: Awake, intoxicated appearing, no distress.  CV:  Good peripheral perfusion.  Resp:  Normal effort.  Abd:  No distention.  Other:  EOMI.  PERRLA.  No facial droop.  Motor intact in all extremities.  No midline spinal tenderness.  Right posterior rib tenderness with no step-off or crepitus.  Right lumbar paraspinal tenderness.   ED Results / Procedures / Treatments   Labs (all labs ordered are listed, but only abnormal results are displayed) Labs Reviewed  BASIC METABOLIC  PANEL WITH GFR - Abnormal; Notable for the following components:      Result Value   Sodium 132 (*)    Potassium 3.3 (*)    CO2 20 (*)    Glucose, Bld 109 (*)    BUN 7 (*)    Calcium 8.4 (*)    All other components within normal limits  CBC WITH DIFFERENTIAL/PLATELET     EKG  ED ECG REPORT I, Lind Repine, the attending physician, personally viewed and interpreted this ECG.  Date: 11/03/2023 EKG Time: 0601 Rate: 94 Rhythm: normal sinus rhythm QRS Axis: normal Intervals: Incomplete RBBB ST/T Wave abnormalities: normal Narrative Interpretation: no evidence of acute ischemia    RADIOLOGY  CT head: I independently viewed and interpreted the images; there is no ICH.  Radiology report indicates no acute abnormality.  CT cervical spine: No acute fracture  XR R ribs/chest: No acute fracture   PROCEDURES:  Critical Care performed: No  Procedures   MEDICATIONS ORDERED IN ED: Medications -  No data to display   IMPRESSION / MDM / ASSESSMENT AND PLAN / ED COURSE  I reviewed the triage vital signs and the nursing notes.  63 year old male with PMH as noted above presents with right-sided rib and back pain after a fall from standing height.  Overall presentation is most consistent with mechanical fall but the patient is somewhat intoxicated, and thus an unreliable historian, so it is unclear if he could have had near syncope or lost consciousness.  Differential diagnosis includes, but is not limited to, mechanical fall, rib contusion, rib fracture, presyncope, syncope.  Patient's presentation is most consistent with acute presentation with potential threat to life or bodily function.  We will obtain CT head and cervical spine, x-rays of the ribs on the right side, and labs.  There is no indication for imaging of the spine; the patient has no midline back pain or any tenderness on exam.  ----------------------------------------- 6:37 AM on  11/03/2023 -----------------------------------------  Imaging is negative for acute traumatic findings.  BMP shows no concerning abnormalities.  CBC is normal.  On reassessment the patient remains alert and is clinically sober.  He is stable for discharge at this time.  I counseled him on the results of the workup.  I gave strict return precautions and he expressed understanding.   FINAL CLINICAL IMPRESSION(S) / ED DIAGNOSES   Final diagnoses:  Fall, initial encounter  Contusion of rib on right side, initial encounter  Alcoholic intoxication without complication (HCC)     Rx / DC Orders   ED Discharge Orders     None        Note:  This document was prepared using Dragon voice recognition software and may include unintentional dictation errors.   Lind Repine, MD 11/03/23 [phone]

## 2023-11-03 NOTE — ED Triage Notes (Signed)
 Patient complains of back pain and left rib pain after an accidental fall from standing.  Patient endorses ETOH use.  He denies LOC, head injury, or thinners.  Patient is A & O X4.

## 2023-11-17 ENCOUNTER — Emergency Department

## 2023-11-17 ENCOUNTER — Other Ambulatory Visit: Payer: Self-pay

## 2023-11-17 ENCOUNTER — Emergency Department
Admission: EM | Admit: 2023-11-17 | Discharge: 2023-11-17 | Disposition: A | Discharge status: Home / Self Care | Attending: Emergency Medicine | Admitting: Emergency Medicine

## 2023-11-17 DIAGNOSIS — Y909 Presence of alcohol in blood, level not specified: Secondary | ICD-10-CM | POA: Diagnosis not present

## 2023-11-17 DIAGNOSIS — M25552 Pain in left hip: Secondary | ICD-10-CM | POA: Diagnosis not present

## 2023-11-17 DIAGNOSIS — F1010 Alcohol abuse, uncomplicated: Secondary | ICD-10-CM

## 2023-11-17 DIAGNOSIS — M25551 Pain in right hip: Secondary | ICD-10-CM | POA: Insufficient documentation

## 2023-11-17 DIAGNOSIS — F10129 Alcohol abuse with intoxication, unspecified: Secondary | ICD-10-CM | POA: Insufficient documentation

## 2023-11-17 DIAGNOSIS — F10929 Alcohol use, unspecified with intoxication, unspecified: Secondary | ICD-10-CM

## 2023-11-17 DIAGNOSIS — R000 Tachycardia, unspecified: Secondary | ICD-10-CM | POA: Insufficient documentation

## 2023-11-17 DIAGNOSIS — W19XXXA Unspecified fall, initial encounter: Secondary | ICD-10-CM | POA: Insufficient documentation

## 2023-11-17 DIAGNOSIS — D329 Benign neoplasm of meninges, unspecified: Secondary | ICD-10-CM

## 2023-11-17 DIAGNOSIS — S0990XA Unspecified injury of head, initial encounter: Secondary | ICD-10-CM | POA: Insufficient documentation

## 2023-11-17 DIAGNOSIS — I10 Essential (primary) hypertension: Secondary | ICD-10-CM | POA: Diagnosis not present

## 2023-11-17 LAB — CBG MONITORING, ED: Glucose-Capillary: 104 mg/dL — ABNORMAL HIGH (ref 70–99)

## 2023-11-17 NOTE — ED Provider Notes (Signed)
 [facility] Provider Note    Event Date/Time   First MD Initiated Contact with Patient 11/17/23 [phone]     (approximate)   History   Fall and Alcohol Intoxication   HPI  Jesse Obrien is a 63 y.o. male past medical history significant for hypertension, hyperlipidemia, alcohol abuse, who presents to the emergency department following a fall.  Patient arrived via EMS following a fall.  Patient states that he is intoxicated and have a fall because he has bad hips.  Endorsed drinking a 24 pack of beer tonight which is typical for him.  Complaining of bilateral hip pain.  Denies being on anticoagulation.     Physical Exam   Triage Vital Signs: ED Triage Vitals  Encounter Vitals Group     BP 11/17/23 0524 137/60     Systolic BP Percentile --      Diastolic BP Percentile --      Pulse Rate 11/17/23 0524 (!) 103     Resp 11/17/23 0524 20     Temp 11/17/23 0524 98 F (36.7 C)     Temp Source 11/17/23 0524 Oral     SpO2 11/17/23 0524 95 %     Weight --      Height --      Head Circumference --      Peak Flow --      Pain Score 11/17/23 0533 9     Pain Loc --      Pain Education --      Exclude from Growth Chart --     Most recent vital signs: Vitals:   11/17/23 0524 11/17/23 0533  BP: 137/60 137/60  Pulse: (!) 103 (!) 103  Resp: 20 20  Temp: 98 F (36.7 C) 97.9 F (36.6 C)  SpO2: 95%     Physical Exam Constitutional:      Comments: Intoxicated with slurring speech  HENT:     Head: Atraumatic.     Right Ear: External ear normal.     Left Ear: External ear normal.     Mouth/Throat:     Mouth: Mucous membranes are moist.  Eyes:     Extraocular Movements: Extraocular movements intact.     Conjunctiva/sclera: Conjunctivae normal.     Pupils: Pupils are equal, round, and reactive to light.  Cardiovascular:     Rate and Rhythm: Tachycardia present.  Pulmonary:     Effort: Pulmonary effort is normal.  Abdominal:     General: There is no  distension.     Tenderness: There is no abdominal tenderness. There is no right CVA tenderness or left CVA tenderness.  Musculoskeletal:        General: Normal range of motion.     Cervical back: Neck supple. No tenderness.     Right lower leg: No edema.     Left lower leg: No edema.     Comments: No midline thoracic, lumbar tenderness to palpation.  Full range of motion to bilateral hips.  Moving all extremities.  No tenderness to palpation to bilateral upper or lower extremities.  Skin:    General: Skin is warm.     Capillary Refill: Capillary refill takes less than 2 seconds.  Neurological:     Mental Status: He is alert. Mental status is at baseline.      IMPRESSION / MDM / ASSESSMENT AND PLAN / ED COURSE  I reviewed the triage vital signs and the nursing notes.  Differential diagnosis including intracranial  hemorrhage, concussion, intoxication, cervical fracture, hip fracture, musculoskeletal strain   RADIOLOGY I independently reviewed imaging, my interpretation of imaging: CT scan of the head with no signs of intracranial hemorrhage  CT scan cervical spine with no acute fracture  X-ray pelvis with no acute fracture or dislocation.  Chest x-ray with no signs of pneumonia, no obvious rib fracture.  No widened mediastinum.   Labs (all labs ordered are listed, but only abnormal results are displayed) Labs interpreted as -    Labs Reviewed  CBG MONITORING, ED    On chart review patient has had prior ED visits following a fall with acute intoxication.  Neurovascularly intact.  No midline cervical spine tenderness.  CT scan of the head and neck without acute findings.  Moving all extremities.  Chest x-ray and pelvis x-ray with no acute fracture.  Plan to metabolize and discharged with rehab information.  Discussed return precautions.     PROCEDURES:  Critical Care performed: No  Procedures  Patient's presentation is most consistent with acute presentation with  potential threat to life or bodily function.   MEDICATIONS ORDERED IN ED: Medications - No data to display  FINAL CLINICAL IMPRESSION(S) / ED DIAGNOSES   Final diagnoses:  Alcoholic intoxication with complication (HCC)  ETOH abuse  Injury of head, initial encounter     Rx / DC Orders   ED Discharge Orders     None        Note:  This document was prepared using Dragon voice recognition software and may include unintentional dictation errors.   Viviano Ground, MD 11/17/23 0630

## 2023-11-17 NOTE — ED Notes (Signed)
 Pt to ED via EMS from home, pt drank 24 pack of beer and fell, c/o back pain.

## 2023-11-17 NOTE — ED Triage Notes (Addendum)
 Patient C/O fall bilateral hip pain, rib pain, and lower back pain. Patient endorses ETOH use. He denies any LOC, or use of blood thinners.

## 2023-11-17 NOTE — ED Notes (Signed)
 Patients wife arrived at this time to transport patient back to residence. Provider at bedside. Patient given discharge instructions and wife was informed of instructions as well.

## 2023-11-17 NOTE — ED Notes (Signed)
 Patient asked nurse to call wife and see if she is going to come and get him. Nurse called and wife stated that she'd be on her way.  Patients wife informed that she would have to sign him out and that she would have to accept responsibility for him as he is intoxicated. Patients wife verbalized understanding.

## 2024-01-27 ENCOUNTER — Emergency Department
Admission: EM | Admit: 2024-01-27 | Discharge: 2024-01-27 | Discharge status: Against Medical Advice | Attending: Emergency Medicine | Admitting: Emergency Medicine

## 2024-01-27 ENCOUNTER — Other Ambulatory Visit: Payer: Self-pay

## 2024-01-27 DIAGNOSIS — Z5321 Procedure and treatment not carried out due to patient leaving prior to being seen by health care provider: Secondary | ICD-10-CM | POA: Diagnosis not present

## 2024-01-27 DIAGNOSIS — M545 Low back pain, unspecified: Secondary | ICD-10-CM | POA: Insufficient documentation

## 2024-01-27 NOTE — ED Notes (Signed)
 Patient observed getting into a car and leaving.

## 2024-01-27 NOTE — ED Triage Notes (Signed)
 Arrives EMS from home. Hx of avascular necrosis of both hips.   Pt says he drinks beer to cope with the pain but lower back and hips have become so painful he is unable to tolerate.

## 2024-01-27 NOTE — ED Notes (Signed)
 Patient to ED waiting area via wheelchair from home by EMS.  Per EMS patient with complaint of back pain and bilateral hip pain.  Patient states not a candidate for surgery due to smoking.  Patient was ambulatory on scene.  +ETOH on board, last drink approx 40 mins prior to arrival.  EMS reports vital signs were within normal limits.

## 2024-02-08 ENCOUNTER — Other Ambulatory Visit: Payer: Self-pay

## 2024-02-08 ENCOUNTER — Emergency Department
Admission: EM | Admit: 2024-02-08 | Discharge: 2024-02-08 | Disposition: A | Discharge status: Home / Self Care | Attending: Emergency Medicine | Admitting: Emergency Medicine

## 2024-02-08 ENCOUNTER — Encounter: Payer: Self-pay | Admitting: *Deleted

## 2024-02-08 DIAGNOSIS — F1010 Alcohol abuse, uncomplicated: Secondary | ICD-10-CM

## 2024-02-08 DIAGNOSIS — Y908 Blood alcohol level of 240 mg/100 ml or more: Secondary | ICD-10-CM | POA: Insufficient documentation

## 2024-02-08 DIAGNOSIS — F10120 Alcohol abuse with intoxication, uncomplicated: Secondary | ICD-10-CM | POA: Insufficient documentation

## 2024-02-08 DIAGNOSIS — F10920 Alcohol use, unspecified with intoxication, uncomplicated: Secondary | ICD-10-CM

## 2024-02-08 LAB — CBC
HCT: 45 % (ref 39.0–52.0)
Hemoglobin: 15.3 g/dL (ref 13.0–17.0)
MCH: 32.8 pg (ref 26.0–34.0)
MCHC: 34 g/dL (ref 30.0–36.0)
MCV: 96.4 fL (ref 80.0–100.0)
Platelets: 179 K/uL (ref 150–400)
RBC: 4.67 MIL/uL (ref 4.22–5.81)
RDW: 12.7 % (ref 11.5–15.5)
WBC: 8.7 K/uL (ref 4.0–10.5)
nRBC: 0 % (ref 0.0–0.2)

## 2024-02-08 LAB — ETHANOL: Alcohol, Ethyl (B): 281 mg/dL — ABNORMAL HIGH (ref <15)

## 2024-02-08 LAB — COMPREHENSIVE METABOLIC PANEL WITH GFR
ALT: 47 U/L — ABNORMAL HIGH (ref 0–44)
AST: 50 U/L — ABNORMAL HIGH (ref 15–41)
Albumin: 4.4 g/dL (ref 3.5–5.0)
Alkaline Phosphatase: 101 U/L (ref 38–126)
Anion gap: 13 (ref 5–15)
BUN: 8 mg/dL (ref 8–23)
CO2: 21 mmol/L — ABNORMAL LOW (ref 22–32)
Calcium: 9.2 mg/dL (ref 8.9–10.3)
Chloride: 101 mmol/L (ref 98–111)
Creatinine, Ser: 0.77 mg/dL (ref 0.61–1.24)
GFR, Estimated: >60 mL/min (ref >60)
Glucose, Bld: 113 mg/dL — ABNORMAL HIGH (ref 70–99)
Potassium: 4.3 mmol/L (ref 3.5–5.1)
Sodium: 135 mmol/L (ref 135–145)
Total Bilirubin: 1.2 mg/dL (ref 0.0–1.2)
Total Protein: 8.2 g/dL — ABNORMAL HIGH (ref 6.5–8.1)

## 2024-02-08 LAB — URINE DRUG SCREEN, QUALITATIVE (ARMC ONLY)
Amphetamines, Ur Screen: NOT DETECTED
Barbiturates, Ur Screen: NOT DETECTED
Benzodiazepine, Ur Scrn: NOT DETECTED
Cannabinoid 50 Ng, Ur ~~LOC~~: NOT DETECTED
Cocaine Metabolite,Ur ~~LOC~~: NOT DETECTED
MDMA (Ecstasy)Ur Screen: NOT DETECTED
Methadone Scn, Ur: NOT DETECTED
Opiate, Ur Screen: NOT DETECTED
Phencyclidine (PCP) Ur S: NOT DETECTED
Tricyclic, Ur Screen: NOT DETECTED

## 2024-02-08 MED ORDER — PHENOBARBITAL 32.4 MG PO TABS
97.2000 mg | ORAL_TABLET | Freq: Once | ORAL | Status: AC
  Administered 2024-02-08: 97.2 mg via ORAL
  Filled 2024-02-08: qty 3

## 2024-02-08 NOTE — Discharge Instructions (Addendum)
 You were given a medication in the emergency department that will treat your withdrawal symptoms for the next 3 days while you find an adequate outpatient or inpatient center for rehabilitation

## 2024-02-08 NOTE — ED Notes (Signed)
 Wife is present to pick up the patient.

## 2024-02-08 NOTE — ED Notes (Signed)
 Patient made inappropriate sexual comments to triage tech. Patient refused to put on socks and is now bare-foot.

## 2024-02-08 NOTE — ED Notes (Signed)
 Patient states if he had a gun he would have already killed himself. Patient states he had SI in the past, but would elaborate if that was the recent past. Patient states no one should challenge me. Patient was calm with this Clinical research associate and took his medications without complaint.

## 2024-02-08 NOTE — ED Notes (Addendum)
 Patient requested his wife be called for transport. His wife agreed to pick him up in about 30 minutes.

## 2024-02-08 NOTE — ED Provider Notes (Signed)
 [facility] Provider Note   Event Date/Time   First MD Initiated Contact with Patient 02/08/24 [phone]     (approximate) History  Alcohol Intoxication  HPI Jesse Obrien is a 63 y.o. male with a past medical history of alcohol abuse and chronic hip/low back pain who presents requesting resources for alcohol detoxification.  Patient will not answer any questions about alcohol detoxification and only complains of hip/back pain.  Patient is clinically intoxicated and despite multiple attempts to engage patient in a history, he will only discuss his hip/back pain that has been chronic for years. ROS: Patient currently denies any vision changes, tinnitus, difficulty speaking, facial droop, sore throat, chest pain, shortness of breath, abdominal pain, nausea/vomiting/diarrhea, dysuria, or weakness/numbness/paresthesias in any extremity   Physical Exam  Triage Vital Signs: ED Triage Vitals  Encounter Vitals Group     BP 02/08/24 1557 (!) 154/105     Girls Systolic BP Percentile --      Girls Diastolic BP Percentile --      Boys Systolic BP Percentile --      Boys Diastolic BP Percentile --      Pulse Rate 02/08/24 1557 (!) 108     Resp 02/08/24 1557 20     Temp 02/08/24 1557 98.6 F (37 C)     Temp Source 02/08/24 1557 Oral     SpO2 02/08/24 1557 96 %     Weight 02/08/24 1558 200 lb (90.7 kg)     Height 02/08/24 1558 5' 10 (1.778 m)     Head Circumference --      Peak Flow --      Pain Score 02/08/24 1558 6     Pain Loc --      Pain Education --      Exclude from Growth Chart --    Most recent vital signs: Vitals:   02/08/24 1557  BP: (!) 154/105  Pulse: (!) 108  Resp: 20  Temp: 98.6 F (37 C)  SpO2: 96%   General: Awake, oriented x4.  Slurred speech CV:  Good peripheral perfusion. Resp:  Normal effort. Abd:  No distention. Other:  Elderly overweight Caucasian male resting comfortably in no acute distress ED Results / Procedures / Treatments   Labs (all labs ordered are listed, but only abnormal results are displayed) Labs Reviewed  COMPREHENSIVE METABOLIC PANEL WITH GFR - Abnormal; Notable for the following components:      Result Value   CO2 21 (*)    Glucose, Bld 113 (*)    Total Protein 8.2 (*)    AST 50 (*)    ALT 47 (*)    All other components within normal limits  ETHANOL - Abnormal; Notable for the following components:   Alcohol, Ethyl (B) 281 (*)    All other components within normal limits  URINE DRUG SCREEN, QUALITATIVE ([facility] ONLY)  CBC   PROCEDURES: Critical Care performed: No Procedures MEDICATIONS ORDERED IN ED: Medications  PHENobarbital  (LUMINAL) tablet 97.2 mg (97.2 mg Oral Given 02/08/24 1646)   IMPRESSION / MDM / ASSESSMENT AND PLAN / ED COURSE  I reviewed the triage vital signs and the nursing notes.                             The patient is on the cardiac monitor to evaluate for evidence of arrhythmia and/or significant heart rate changes. Patient's presentation is most consistent with acute presentation with  potential threat to life or bodily function. Presents with altered mental status. +Slurred, sluggish behavior. Stated EtOH intoxication. Airway maintained. Unlikely intracranial bleed, opioid intoxication or coingestion, sepsis, hypothyroidism. Suspect likely transient course of intoxication with expected  improvement of symptoms as patient metabolizes offending agent.  Plan: frequent reassessments Patient given dose of phenobarbital  prior to discharge if patient does decide to stop using alcohol altogether Reassessment Note: Time: 2 hours since initial presentation. Evaluation: Frequent mental status exams showed improving symptoms and evidence that the patient's AMS was secondary to intoxication. Pt able to ambulate without difficulty and PO tolerant. Plan DC home with ride and return precautions. Disposition: Discharge home    FINAL CLINICAL IMPRESSION(S) / ED DIAGNOSES   Final  diagnoses:  Alcoholic intoxication without complication (HCC)  Alcohol abuse   Rx / DC Orders   ED Discharge Orders     None      Note:  This document was prepared using Dragon voice recognition software and may include unintentional dictation errors.   Jesse Artist POUR, MD 02/08/24 Jesse Obrien

## 2024-02-08 NOTE — ED Notes (Addendum)
 Blue checked shirt Jeans Black slides Checked boxers [place] Cell phone [place] colored watch

## 2024-02-08 NOTE — ED Notes (Signed)
 Patient declined discharge vital signs. Patient was advised not to drink alcohol with the medication that was given. Patient replied, Fuck you, I'm going to buy some beer. Patient was walked out to ED drive where his wife was waiting to drive him home. Patient's wife was informed that the patient shouldn't drink alcohol after taking this medication.

## 2024-02-08 NOTE — ED Triage Notes (Addendum)
 Pt ambulatory to triage.  Pt reports etoh use.  Pt reports SI denies HI  Pt requesting detox.  Last drink was 1 hour age.      Pt alert,  pt cooperative in triage.

## 2024-02-25 ENCOUNTER — Emergency Department
Admission: EM | Admit: 2024-02-25 | Discharge: 2024-02-25 | Discharge status: Against Medical Advice | Attending: Emergency Medicine | Admitting: Emergency Medicine

## 2024-02-25 ENCOUNTER — Emergency Department

## 2024-02-25 DIAGNOSIS — M25552 Pain in left hip: Secondary | ICD-10-CM | POA: Insufficient documentation

## 2024-02-25 DIAGNOSIS — R062 Wheezing: Secondary | ICD-10-CM | POA: Diagnosis not present

## 2024-02-25 DIAGNOSIS — M25551 Pain in right hip: Secondary | ICD-10-CM | POA: Diagnosis not present

## 2024-02-25 DIAGNOSIS — Z5321 Procedure and treatment not carried out due to patient leaving prior to being seen by health care provider: Secondary | ICD-10-CM | POA: Insufficient documentation

## 2024-02-25 LAB — CBC
HCT: 47.9 % (ref 39.0–52.0)
Hemoglobin: 15.9 g/dL (ref 13.0–17.0)
MCH: 32.6 pg (ref 26.0–34.0)
MCHC: 33.2 g/dL (ref 30.0–36.0)
MCV: 98.2 fL (ref 80.0–100.0)
Platelets: 163 K/uL (ref 150–400)
RBC: 4.88 MIL/uL (ref 4.22–5.81)
RDW: 13 % (ref 11.5–15.5)
WBC: 8.4 K/uL (ref 4.0–10.5)
nRBC: 0 % (ref 0.0–0.2)

## 2024-02-25 LAB — BASIC METABOLIC PANEL WITH GFR
Anion gap: 12 (ref 5–15)
BUN: 7 mg/dL — ABNORMAL LOW (ref 8–23)
CO2: 21 mmol/L — ABNORMAL LOW (ref 22–32)
Calcium: 8.7 mg/dL — ABNORMAL LOW (ref 8.9–10.3)
Chloride: 103 mmol/L (ref 98–111)
Creatinine, Ser: 0.64 mg/dL (ref 0.61–1.24)
GFR, Estimated: >60 mL/min (ref >60)
Glucose, Bld: 114 mg/dL — ABNORMAL HIGH (ref 70–99)
Potassium: 3.5 mmol/L (ref 3.5–5.1)
Sodium: 136 mmol/L (ref 135–145)

## 2024-02-25 NOTE — ED Triage Notes (Signed)
 Pt presents to the ED via [facility] for bilateral hip pain. Pt has had multiple falls over the last couple of years. Pt states that he fell today. Does not take a blood thinner. Denies LOC. Pt had some wheezing pta and was given a duoneb. Pt reports SHOB  93% RA 132/83 CBG 125 HR 83 RR 13 ETCO2 35

## 2024-03-16 ENCOUNTER — Encounter: Payer: Self-pay | Admitting: Emergency Medicine

## 2024-03-16 ENCOUNTER — Other Ambulatory Visit: Payer: Self-pay

## 2024-03-16 DIAGNOSIS — Y908 Blood alcohol level of 240 mg/100 ml or more: Secondary | ICD-10-CM | POA: Diagnosis not present

## 2024-03-16 DIAGNOSIS — I10 Essential (primary) hypertension: Secondary | ICD-10-CM | POA: Insufficient documentation

## 2024-03-16 DIAGNOSIS — F10920 Alcohol use, unspecified with intoxication, uncomplicated: Secondary | ICD-10-CM | POA: Insufficient documentation

## 2024-03-16 DIAGNOSIS — J449 Chronic obstructive pulmonary disease, unspecified: Secondary | ICD-10-CM | POA: Insufficient documentation

## 2024-03-16 LAB — CBC
HCT: 44.1 % (ref 39.0–52.0)
Hemoglobin: 15.2 g/dL (ref 13.0–17.0)
MCH: 33.1 pg (ref 26.0–34.0)
MCHC: 34.5 g/dL (ref 30.0–36.0)
MCV: 96.1 fL (ref 80.0–100.0)
Platelets: 191 K/uL (ref 150–400)
RBC: 4.59 MIL/uL (ref 4.22–5.81)
RDW: 12.7 % (ref 11.5–15.5)
WBC: 9.2 K/uL (ref 4.0–10.5)
nRBC: 0 % (ref 0.0–0.2)

## 2024-03-16 NOTE — ED Triage Notes (Addendum)
 Patient requesting help with etoh abuse.  Patient normally drinks 24 pack daily, last drank 2200 tonight.  Patient denies SI/HI.   128/87 CBG 90 100 HR

## 2024-03-16 NOTE — ED Notes (Signed)
 Patient states, if you don't take me to a room downstairs, I'm leaving.

## 2024-03-17 ENCOUNTER — Emergency Department: Admission: EM | Admit: 2024-03-17 | Discharge: 2024-03-17 | Disposition: A | Discharge status: Home / Self Care

## 2024-03-17 DIAGNOSIS — F10920 Alcohol use, unspecified with intoxication, uncomplicated: Secondary | ICD-10-CM

## 2024-03-17 LAB — COMPREHENSIVE METABOLIC PANEL WITH GFR
ALT: 40 U/L (ref 0–44)
AST: 39 U/L (ref 15–41)
Albumin: 4.2 g/dL (ref 3.5–5.0)
Alkaline Phosphatase: 77 U/L (ref 38–126)
Anion gap: 12 (ref 5–15)
BUN: 8 mg/dL (ref 8–23)
CO2: 20 mmol/L — ABNORMAL LOW (ref 22–32)
Calcium: 8.5 mg/dL — ABNORMAL LOW (ref 8.9–10.3)
Chloride: 101 mmol/L (ref 98–111)
Creatinine, Ser: 0.88 mg/dL (ref 0.61–1.24)
GFR, Estimated: >60 mL/min (ref >60)
Glucose, Bld: 105 mg/dL — ABNORMAL HIGH (ref 70–99)
Potassium: 3.5 mmol/L (ref 3.5–5.1)
Sodium: 133 mmol/L — ABNORMAL LOW (ref 135–145)
Total Bilirubin: 0.7 mg/dL (ref 0.0–1.2)
Total Protein: 7.9 g/dL (ref 6.5–8.1)

## 2024-03-17 LAB — URINE DRUG SCREEN, QUALITATIVE (ARMC ONLY)
Amphetamines, Ur Screen: NOT DETECTED
Barbiturates, Ur Screen: NOT DETECTED
Benzodiazepine, Ur Scrn: NOT DETECTED
Cannabinoid 50 Ng, Ur ~~LOC~~: NOT DETECTED
Cocaine Metabolite,Ur ~~LOC~~: NOT DETECTED
MDMA (Ecstasy)Ur Screen: NOT DETECTED
Methadone Scn, Ur: NOT DETECTED
Opiate, Ur Screen: NOT DETECTED
Phencyclidine (PCP) Ur S: NOT DETECTED
Tricyclic, Ur Screen: NOT DETECTED

## 2024-03-17 LAB — ETHANOL: Alcohol, Ethyl (B): 244 mg/dL — ABNORMAL HIGH (ref <15)

## 2024-03-17 NOTE — Discharge Instructions (Signed)
 Your evaluation in the emergency department was notable for alcohol intoxication though otherwise reassuring.  I highly encourage you to stop drinking.  Please do follow-up with your primary care provider for reevaluation, and return to the emergency department with any new or worsening symptoms.

## 2024-03-17 NOTE — ED Notes (Signed)
 Pt ambulatory around the lobby to get food out of the vending machines.

## 2024-03-17 NOTE — ED Notes (Signed)
 Pt escorted to wife's car by RN and security. PT in no acute distress prior to discharge. Discharged instructions reviewed and pt stated that they understand directions. Pt has all belongings with them at time of discharge.

## 2024-03-17 NOTE — ED Notes (Signed)
 Spoke with Pts wife April who confirmed that she is waiting in her car to take him home.

## 2024-03-17 NOTE — ED Provider Notes (Signed)
 [facility] Provider Note    Event Date/Time   First MD Initiated Contact with Patient 03/17/24 0100     (approximate)   History   Alcohol Problem  Patient requesting help with etoh abuse.  Patient normally drinks 24 pack daily, last drank 2200 tonight.  Patient denies SI/HI.   128/87 CBG 90 100 HR    HPI Jesse Obrien is a 63 y.o. male PMH hypertension, alcohol use disorder, COPD presents intoxicated with alcohol and requesting resources for alcohol detoxification. -Attempted to interview patient though he immediately became very agitated screaming at me that I was a fucking quack and requesting discharge  Per chart review, last seen in our emergency department on 02/08/2024 for same.  Was intoxicated at that time.     Physical Exam   Triage Vital Signs: ED Triage Vitals  Encounter Vitals Group     BP 03/16/24 2329 (!) 133/93     Girls Systolic BP Percentile --      Girls Diastolic BP Percentile --      Boys Systolic BP Percentile --      Boys Diastolic BP Percentile --      Pulse Rate 03/16/24 2329 96     Resp 03/16/24 2329 18     Temp 03/16/24 2329 97.9 F (36.6 C)     Temp Source 03/16/24 2329 Oral     SpO2 03/16/24 2329 96 %     Weight 03/16/24 2330 200 lb (90.7 kg)     Height --      Head Circumference --      Peak Flow --      Pain Score 03/16/24 2329 10     Pain Loc --      Pain Education --      Exclude from Growth Chart --     Most recent vital signs: Vitals:   03/16/24 2329 03/17/24 0100  BP: (!) 133/93 119/84  Pulse: 96   Resp: 18   Temp: 97.9 F (36.6 C)   SpO2: 96%      General: Awake, no distress.  CV:  Good peripheral perfusion. RRR, RP 2+ Resp:  Normal effort. CTAB Abd:  No distention. Nontender to deep palpation throughout Psych:  Agitated and verbally aggressive though not physically aggressive.   ED Results / Procedures / Treatments   Labs (all labs ordered are listed, but only abnormal results  are displayed) Labs Reviewed  COMPREHENSIVE METABOLIC PANEL WITH GFR - Abnormal; Notable for the following components:      Result Value   Sodium 133 (*)    CO2 20 (*)    Glucose, Bld 105 (*)    Calcium 8.5 (*)    All other components within normal limits  ETHANOL - Abnormal; Notable for the following components:   Alcohol, Ethyl (B) 244 (*)    All other components within normal limits  CBC  URINE DRUG SCREEN, QUALITATIVE ([facility] ONLY)     EKG  N/a   RADIOLOGY N/a    PROCEDURES:  Critical Care performed: No  Procedures   MEDICATIONS ORDERED IN ED: Medications - No data to display   IMPRESSION / MDM / ASSESSMENT AND PLAN / ED COURSE  I reviewed the triage vital signs and the nursing notes.                              DDX/MDM/AP: Differential diagnosis includes, but is not limited  to, alcohol intoxication, suspect initial aggression secondary to this.  No evidence of other pathology at this time.  I left the room with patient became aggressive and called security.  With security at bedside, I told him that he cannot be aggressive to myself or to our staff.  Notified him that if he continued to be aggressive I may have to place him on IVC.  Patient immediately calmed down and was amenable to evaluation.  Responded to all questions appropriately.  Understood that it was inappropriate for him to have gotten worked up, not full evaluation.  Denies SI, HI, hallucinations.  Calm and cooperative throughout reevaluation.  Requesting discharge home, ambulating without difficulty.  Wife is here at the hospital and is amenable to taking him home.  Does not appear to be danger to himself or others at this time and did respond appropriately when I told him his initial behavior was unacceptable.  Stable for discharge home in the care of his family.    Plan: - Labs reassuring, notable for alcohol intoxication only - Now stating he would not like any resources for detox   Patient's  presentation is most consistent with acute presentation with potential threat to life or bodily function.  ED course below.  No evidence of psychiatric decompensation at this time, is somewhat intoxicated though has been in the emergency department for a total of about 2-1/2 hours and I do believe is clinically sobering.  Was originally somewhat verbally aggressive though never physically and he did de-escalate appropriately, was able to reason with him.  Family is physically here at the hospital and comfortable taking him home.  No evidence of acute pathology at this time, stable for discharge.  Clinical Course as of 03/17/24 0152  Sun Mar 17, 2024  0115 CBC reviewed, unremarkable  CMP reviewed, overall unremarkable  EtOH 244  UDS negative [MM]    Clinical Course User Index [MM] Clarine Ozell LABOR, MD     FINAL CLINICAL IMPRESSION(S) / ED DIAGNOSES   Final diagnoses:  Alcoholic intoxication without complication (HCC)     Rx / DC Orders   ED Discharge Orders     None        Note:  This document was prepared using Dragon voice recognition software and may include unintentional dictation errors.   Clarine Ozell LABOR, MD 03/17/24 [phone]

## 2024-05-26 ENCOUNTER — Emergency Department

## 2024-05-26 ENCOUNTER — Emergency Department
Admission: EM | Admit: 2024-05-26 | Discharge: 2024-05-26 | Disposition: A | Discharge status: Home / Self Care | Attending: Emergency Medicine | Admitting: Emergency Medicine

## 2024-05-26 ENCOUNTER — Other Ambulatory Visit: Payer: Self-pay

## 2024-05-26 DIAGNOSIS — G8929 Other chronic pain: Secondary | ICD-10-CM

## 2024-05-26 DIAGNOSIS — F10120 Alcohol abuse with intoxication, uncomplicated: Secondary | ICD-10-CM | POA: Diagnosis not present

## 2024-05-26 DIAGNOSIS — W19XXXA Unspecified fall, initial encounter: Secondary | ICD-10-CM | POA: Diagnosis not present

## 2024-05-26 DIAGNOSIS — Y908 Blood alcohol level of 240 mg/100 ml or more: Secondary | ICD-10-CM | POA: Diagnosis not present

## 2024-05-26 DIAGNOSIS — M546 Pain in thoracic spine: Secondary | ICD-10-CM | POA: Diagnosis present

## 2024-05-26 DIAGNOSIS — M4804 Spinal stenosis, thoracic region: Secondary | ICD-10-CM

## 2024-05-26 LAB — CBC WITH DIFFERENTIAL/PLATELET
Abs Immature Granulocytes: 0.19 K/uL — ABNORMAL HIGH (ref 0.00–0.07)
Basophils Absolute: 0.1 K/uL (ref 0.0–0.1)
Basophils Relative: 1 %
Eosinophils Absolute: 0.3 K/uL (ref 0.0–0.5)
Eosinophils Relative: 3 %
HCT: 44.9 % (ref 39.0–52.0)
Hemoglobin: 15.2 g/dL (ref 13.0–17.0)
Immature Granulocytes: 2 %
Lymphocytes Relative: 32 %
Lymphs Abs: 2.9 K/uL (ref 0.7–4.0)
MCH: 32.5 pg (ref 26.0–34.0)
MCHC: 33.9 g/dL (ref 30.0–36.0)
MCV: 95.9 fL (ref 80.0–100.0)
Monocytes Absolute: 0.8 K/uL (ref 0.1–1.0)
Monocytes Relative: 8 %
Neutro Abs: 5 K/uL (ref 1.7–7.7)
Neutrophils Relative %: 54 %
Platelets: 208 K/uL (ref 150–400)
RBC: 4.68 MIL/uL (ref 4.22–5.81)
RDW: 12.7 % (ref 11.5–15.5)
WBC: 9.2 K/uL (ref 4.0–10.5)
nRBC: 0 % (ref 0.0–0.2)

## 2024-05-26 LAB — COMPREHENSIVE METABOLIC PANEL WITH GFR
ALT: 53 U/L — ABNORMAL HIGH (ref 0–44)
AST: 51 U/L — ABNORMAL HIGH (ref 15–41)
Albumin: 4.1 g/dL (ref 3.5–5.0)
Alkaline Phosphatase: 85 U/L (ref 38–126)
Anion gap: 14 (ref 5–15)
BUN: 6 mg/dL — ABNORMAL LOW (ref 8–23)
CO2: 20 mmol/L — ABNORMAL LOW (ref 22–32)
Calcium: 8.6 mg/dL — ABNORMAL LOW (ref 8.9–10.3)
Chloride: 98 mmol/L (ref 98–111)
Creatinine, Ser: 1.02 mg/dL (ref 0.61–1.24)
GFR, Estimated: >60 mL/min (ref >60)
Glucose, Bld: 109 mg/dL — ABNORMAL HIGH (ref 70–99)
Potassium: 4.4 mmol/L (ref 3.5–5.1)
Sodium: 131 mmol/L — ABNORMAL LOW (ref 135–145)
Total Bilirubin: 0.4 mg/dL (ref 0.0–1.2)
Total Protein: 7.6 g/dL (ref 6.5–8.1)

## 2024-05-26 LAB — ETHANOL: Alcohol, Ethyl (B): 327 mg/dL (ref <15)

## 2024-05-26 NOTE — ED Triage Notes (Signed)
 Pt presents for thoracic back pain after a fall tonight r/t alcohol intoxication. No step offs or deformities. Able to ambulate and move all extremities. No incontinence.

## 2024-05-26 NOTE — ED Notes (Signed)
 Patients wife April states she can pick up patient in about 30 minutes. Patient standing using urinal.

## 2024-05-26 NOTE — ED Provider Notes (Signed)
 Emergency department handoff note  Care of this patient was signed out to me at the end of the previous provider shift.  All pertinent patient information was conveyed and all questions were answered.  Patient pending CT of the thoracic spine that showed bilateral multilevel neuroforaminal stenosis.  I discussed these findings with the patient as well as need for follow-up with spinal surgery including providing follow-up information for Dr. Katrina.  Patient expresses understanding and all questions were answered prior to discharge.  Patient given strict return precautions  Dispo: Discharge home with neurosurgical follow-up   Joevanni Roddey K, MD 05/26/24 [phone]

## 2024-05-26 NOTE — ED Provider Notes (Signed)
 [facility] Provider Note    Event Date/Time   First MD Initiated Contact with Patient 05/26/24 [phone]     (approximate)   History   Back Pain and Fall  Level 5 caveat:  history/ROS limited by acute intoxication  HPI Jesse Obrien is a 63 y.o. male with history of alcohol abuse who presents for evaluation of thoracic back pain after fall tonight.  He is able to ambulate and move all his extremities and reports no new numbness or weakness.  He is not having any trouble with incontinence or difficulty urinating.  He denies headache and neck pain.  He admits to drinking a lot.     Physical Exam   Triage Vital Signs: ED Triage Vitals [05/26/24 0503]  Encounter Vitals Group     BP      Girls Systolic BP Percentile      Girls Diastolic BP Percentile      Boys Systolic BP Percentile      Boys Diastolic BP Percentile      Pulse Rate 83     Resp 16     Temp 97.9 F (36.6 C)     Temp Source Oral     SpO2 98 %     Weight      Height      Head Circumference      Peak Flow      Pain Score 10     Pain Loc      Pain Education      Exclude from Growth Chart     Most recent vital signs: Vitals:   05/26/24 0503  Pulse: 83  Resp: 16  Temp: 97.9 F (36.6 C)  SpO2: 98%    General: Initially awake and alert and conversant though clearly intoxicated.   CV:  Good peripheral perfusion.  Resp:  Normal effort. Speaking ea though with slurred speech sily and comfortably, no accessory muscle usage nor intercostal retractions.   Abd:  No distention.  Other:  No visible or palpable deformities.  Pain when he moves around, seems to be generalized in his thoracic spine   ED Results / Procedures / Treatments   Labs (all labs ordered are listed, but only abnormal results are displayed) Labs Reviewed  CBC WITH DIFFERENTIAL/PLATELET - Abnormal; Notable for the following components:      Result Value   Abs Immature Granulocytes 0.19 (*)    All other components  within normal limits  COMPREHENSIVE METABOLIC PANEL WITH GFR - Abnormal; Notable for the following components:   Sodium 131 (*)    CO2 20 (*)    Glucose, Bld 109 (*)    BUN 6 (*)    Calcium 8.6 (*)    AST 51 (*)    ALT 53 (*)    All other components within normal limits  ETHANOL - Abnormal; Notable for the following components:   Alcohol, Ethyl (B) 327 (*)    All other components within normal limits     RADIOLOGY See ED course for details   PROCEDURES:  Critical Care performed: No  Procedures    IMPRESSION / MDM / ASSESSMENT AND PLAN / ED COURSE  I reviewed the triage vital signs and the nursing notes.                              Differential diagnosis includes, but is not limited to, alcohol intoxication, musculoskeletal strain, fracture,  listhesis  Patient's presentation is most consistent with acute presentation with potential threat to life or bodily function.  Labs/studies ordered: Ethanol level, CBC with differential, CMP, CT cervical spine, CT head, CT thoracic spine  Interventions/Medications given:  Medications - No data to display  (Note:  hospital course my include additional interventions and/or labs/studies not listed above.)   No neurological deficits, normal vital signs, reassuring physical exam.  Suspect musculoskeletal strain in the setting of fall.  Imaging pending.  Patient fell asleep and sleeping comfortably with no distress  Clinical Course as of 05/26/24 9285  [facility] May 26, 2024  0654 Transferring E [CF]  0654 I independtly viewed and interpreted the patient's head CT and cervical spine CT, and I also reviewed the radiologist's report(s).  I see no evidence of acute injury. [CF]  [id number] I also independently reviewed the patient's thoracic spine CT.  There are a few abnormalities that I cannot tell if they are acute or chronic.  No evidence of wedge or compression fracture. [CF]  [phone] Transferring ED care to Dr. Jossie to follow-up on  official thoracic spine radiology report and reassess the patient when he is awake for appropriateness for discharge. [CF]    Clinical Course User Index [CF] Gordan Huxley, MD     FINAL CLINICAL IMPRESSION(S) / ED DIAGNOSES   Final diagnoses:  None     Rx / DC Orders   ED Discharge Orders     None        Note:  This document was prepared using Dragon voice recognition software and may include unintentional dictation errors.   Gordan Huxley, MD 05/26/24 [phone]

## 2024-05-30 ENCOUNTER — Emergency Department
Admission: EM | Admit: 2024-05-30 | Discharge: 2024-05-30 | Disposition: A | Discharge status: Home / Self Care | Attending: Emergency Medicine | Admitting: Emergency Medicine

## 2024-05-30 ENCOUNTER — Emergency Department

## 2024-05-30 DIAGNOSIS — R296 Repeated falls: Secondary | ICD-10-CM | POA: Diagnosis not present

## 2024-05-30 DIAGNOSIS — R456 Violent behavior: Secondary | ICD-10-CM | POA: Insufficient documentation

## 2024-05-30 DIAGNOSIS — F1010 Alcohol abuse, uncomplicated: Secondary | ICD-10-CM | POA: Diagnosis present

## 2024-05-30 DIAGNOSIS — I10 Essential (primary) hypertension: Secondary | ICD-10-CM | POA: Insufficient documentation

## 2024-05-30 DIAGNOSIS — Y909 Presence of alcohol in blood, level not specified: Secondary | ICD-10-CM | POA: Insufficient documentation

## 2024-05-30 DIAGNOSIS — R4689 Other symptoms and signs involving appearance and behavior: Secondary | ICD-10-CM

## 2024-05-30 DIAGNOSIS — F1090 Alcohol use, unspecified, uncomplicated: Secondary | ICD-10-CM

## 2024-05-30 MED ORDER — ACETAMINOPHEN 500 MG PO TABS: 1000.0000 mg | ORAL_TABLET | Freq: Once | ORAL | Status: DC

## 2024-05-30 NOTE — Discharge Instructions (Addendum)
 You may alternate over the counter Tylenol  1000 mg every 6 hours as needed for pain, fever and Ibuprofen  800 mg every 6-8 hours as needed for pain, fever.  Please take Ibuprofen  with food.  Do not take more than 4000 mg of Tylenol  (acetaminophen ) in a 24 hour period.  CT scans of your head, cervical spine and thoracic spine showed no acute traumatic injury from your fall 4 days ago.  X-ray of your chest and ribs showed no acute rib fracture today.
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
## 2024-06-12 ENCOUNTER — Other Ambulatory Visit: Payer: Self-pay

## 2024-06-12 ENCOUNTER — Emergency Department
Admission: EM | Admit: 2024-06-12 | Discharge: 2024-06-13 | Disposition: A | Discharge status: Home / Self Care | Attending: Emergency Medicine | Admitting: Emergency Medicine

## 2024-06-12 DIAGNOSIS — I10 Essential (primary) hypertension: Secondary | ICD-10-CM | POA: Diagnosis not present

## 2024-06-12 DIAGNOSIS — R0789 Other chest pain: Secondary | ICD-10-CM | POA: Diagnosis present

## 2024-06-12 NOTE — ED Triage Notes (Addendum)
 Pt to ED via EMS from home, pt reports he fell on 11/25 and was seen after, pt has had right rib pain since. Pt denies new injuries. Pt reports drinking a 24 pack of beer pta.

## 2024-06-13 ENCOUNTER — Emergency Department

## 2024-06-13 MED ORDER — CYCLOBENZAPRINE HCL 5 MG PO TABS: 5.0000 mg | ORAL_TABLET | Freq: Three times a day (TID) | ORAL | 0 refills | Status: DC | PRN

## 2024-06-13 NOTE — ED Notes (Signed)
 AVS provided by edp was reviewed with pt. Discussed prescription and attempted to verify pharmacy. Pt responded by throwing paperwork across the room stating this is bull shit. Pt was ambulatory to wheelchair.   Pt to await ride home in lobby. Wife in route to pick up pt.

## 2024-06-13 NOTE — Discharge Instructions (Addendum)
 Your CT scan today showed normal results.  Please take your muscle relaxer as needed for chest wall discomfort, as written.  Do not drive or drink alcohol while taking this medication.  Please follow-up with your doctor for recheck/reexamination.

## 2024-06-13 NOTE — ED Provider Notes (Signed)
 [facility] Provider Note    Event Date/Time   First MD Initiated Contact with Patient 06/12/24 2350     (approximate)  History   Chief Complaint: Rib Injury  HPI  Jesse Obrien is a 63 y.o. male with a past medical history of alcohol abuse, hypertension, presents emergency department for right sided chest wall pain.  According to the patient he fell approximately 2 weeks ago, states he was seen in the emergency department at that time was told he could have a broken rib.  I reviewed the patient's x-rays from that time which did not show any obvious broken ribs.  Patient states anytime he coughs he gets a sharp stabbing of pain to the area.  States he feels like his chest is congested.  Patient does admit to drinking a significant amount of alcohol earlier today.  Physical Exam   Triage Vital Signs: ED Triage Vitals  Encounter Vitals Group     BP 06/12/24 2346 131/89     Girls Systolic BP Percentile --      Girls Diastolic BP Percentile --      Boys Systolic BP Percentile --      Boys Diastolic BP Percentile --      Pulse Rate 06/12/24 2346 96     Resp 06/12/24 2346 19     Temp 06/12/24 2346 97.8 F (36.6 C)     Temp src --      SpO2 06/12/24 2346 100 %     Weight 06/12/24 2346 210 lb (95.3 kg)     Height 06/12/24 2346 5' 11 (1.803 m)     Head Circumference --      Peak Flow --      Pain Score 06/12/24 2345 4     Pain Loc --      Pain Education --      Exclude from Growth Chart --     Most recent vital signs: Vitals:   06/12/24 2346  BP: 131/89  Pulse: 96  Resp: 19  Temp: 97.8 F (36.6 C)  SpO2: 100%    General: Awake, no distress.  CV:  Good peripheral perfusion.  Regular rate and rhythm  Resp:  Normal effort.  Equal breath sounds bilaterally.  Slight expiratory wheeze.  No rales or rhonchi.  Right-sided chest wall tenderness. Abd:  No distention.  Soft, nontender.  No rebound or guarding.   ED Results / Procedures / Treatments    RADIOLOGY  I have reviewed interpret the CT images.  I do not appreciate any significant rib fracture on my evaluation. Radiology has read the CT scan as negative.   MEDICATIONS ORDERED IN ED: Medications - No data to display   IMPRESSION / MDM / ASSESSMENT AND PLAN / ED COURSE  I reviewed the triage vital signs and the nursing notes.  Patient's presentation is most consistent with acute illness / injury with system symptoms.  Patient presents emergency department for continued pain to the right side of his chest worse anytime he coughs.  Patient was seen on 05/30/2024 had an x-ray done at that time that was negative for any acute rib fractures.  However patient states the pain is worse especially when he coughs.  Will proceed with a CT scan of the chest to evaluate for possible rib fractures also to rule out pneumonia or pneumothorax.  Patient agreeable to plan of care.  CT scan of the chest is negative for rib fracture lung injury or infectious  process.  Discussed with the patient we could try a muscle relaxer to see if this helps.  I also discussed he cannot take this medication while drinking.  He understands this.  We will discharge with outpatient follow-up.  FINAL CLINICAL IMPRESSION(S) / ED DIAGNOSES   Chest wall pain  Note:  This document was prepared using Dragon voice recognition software and may include unintentional dictation errors.   Dorothyann Drivers, MD 06/13/24 [phone]

## 2024-07-31 ENCOUNTER — Emergency Department
Admission: EM | Admit: 2024-07-31 | Discharge: 2024-07-31 | Disposition: A | Discharge status: Home / Self Care | Attending: Emergency Medicine | Admitting: Emergency Medicine

## 2024-07-31 ENCOUNTER — Emergency Department

## 2024-07-31 ENCOUNTER — Other Ambulatory Visit: Payer: Self-pay

## 2024-07-31 DIAGNOSIS — J449 Chronic obstructive pulmonary disease, unspecified: Secondary | ICD-10-CM | POA: Insufficient documentation

## 2024-07-31 DIAGNOSIS — I10 Essential (primary) hypertension: Secondary | ICD-10-CM | POA: Insufficient documentation

## 2024-07-31 DIAGNOSIS — S322XXA Fracture of coccyx, initial encounter for closed fracture: Secondary | ICD-10-CM | POA: Insufficient documentation

## 2024-07-31 DIAGNOSIS — W000XXA Fall on same level due to ice and snow, initial encounter: Secondary | ICD-10-CM | POA: Diagnosis not present

## 2024-07-31 DIAGNOSIS — W19XXXA Unspecified fall, initial encounter: Secondary | ICD-10-CM

## 2024-07-31 DIAGNOSIS — Y92009 Unspecified place in unspecified non-institutional (private) residence as the place of occurrence of the external cause: Secondary | ICD-10-CM | POA: Diagnosis not present

## 2024-07-31 NOTE — Discharge Instructions (Addendum)
 Take OTC Tylenol  or Motrin  for your fracture. Follow-up with your primary provider or Ortho for further management.

## 2024-07-31 NOTE — ED Provider Notes (Signed)
 "   [facility] Emergency Department Provider Note     Event Date/Time   First MD Initiated Contact with Patient 07/31/24 2220     (approximate)   History   Fall   HPI  Jesse Obrien is a 64 y.o. male with a history of HTN, DDD, alcohol use disorder, GERD, COPD, and DJD, presents to the ED following mechanical fall.  Patient presents to the ED via EMS from home, after he fell on ice injuring his buttocks and lower back.  Patient denies any head injury or LOC.  He does admit to daily EtOH intake.  He presents to the ED for evaluation of his low back and buttocks pain.  No reports of any bladder or bowel incontinence, foot drop, or saddle anesthesia.   Physical Exam   Triage Vital Signs: ED Triage Vitals  Encounter Vitals Group     BP 07/31/24 2106 109/79     Girls Systolic BP Percentile --      Girls Diastolic BP Percentile --      Boys Systolic BP Percentile --      Boys Diastolic BP Percentile --      Pulse Rate 07/31/24 2106 94     Resp 07/31/24 2106 18     Temp 07/31/24 2106 98.4 F (36.9 C)     Temp src --      SpO2 07/31/24 2106 100 %     Weight 07/31/24 2105 196 lb (88.9 kg)     Height 07/31/24 2105 5' 10 (1.778 m)     Head Circumference --      Peak Flow --      Pain Score 07/31/24 2105 6     Pain Loc --      Pain Education --      Exclude from Growth Chart --     Most recent vital signs: Vitals:   07/31/24 2106  BP: 109/79  Pulse: 94  Resp: 18  Temp: 98.4 F (36.9 C)  SpO2: 100%    General Awake, no distress.  NAD HEENT NCAT. PERRL. EOMI. No rhinorrhea. Mucous membranes are moist.  CV:  Good peripheral perfusion.  RESP:  Normal effort.  ABD:  No distention.  MSK:  AROM of all extremities.  NEURO: Cranial nerves II to XII grossly intact.   ED Results / Procedures / Treatments   Labs (all labs ordered are listed, but only abnormal results are displayed) Labs Reviewed - No data to  display   EKG    RADIOLOGY  I personally viewed and evaluated these images as part of my medical decision making, as well as reviewing the written report by the radiologist.  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 FINDINGS: SI joints are non widened. Pubic symphysis and rami are intact. Moderate bilateral femoroacetabular degenerative changes. 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 There may be partial flattening or collapse. No definite fracture or dislocation IMPRESSION: 1. No definite acute osseous abnormality. 2. Moderate bilateral femoroacetabular degenerative changes. 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 Electronically Signed   By: Luke Bun M.D.   On: 07/31/2024 21:55   DG Sacrum/Coccyx Result Date:  07/31/2024 CLINICAL DATA:  Fall EXAM: SACRUM AND COCCYX - 2+ VIEW COMPARISON:  None Available. FINDINGS: SI joints are non widened. Pubic symphysis and rami appear intact. Suspicion of a mildly displaced sacrococcygeal fracture. IMPRESSION: Suspicion of a mildly displaced sacrococcygeal fracture. Electronically Signed   By: Luke Bun M.D.   On: 07/31/2024 21:43   DG Lumbar Spine 2-3 Views Result Date: 07/31/2024 CLINICAL DATA:  Fall EXAM: LUMBAR SPINE - 2-3 VIEW COMPARISON:  10/22/2021 FINDINGS: Five lumbar type 2 vertebra. Lumbar alignment is. Vertebral body heights are maintained. Multilevel degenerative osteophytes. Mild disc space narrowing at L4-L5 and L5-S1. Lower lumbar facet degenerative change IMPRESSION: Degenerative changes. No acute osseous abnormality. Electronically Signed   By: Luke Bun M.D.   On:  07/31/2024 21:41     PROCEDURES:  Critical Care performed: No  Procedures   MEDICATIONS ORDERED IN ED: Medications - No data to display   IMPRESSION / MDM / ASSESSMENT AND PLAN / ED COURSE  I reviewed the triage vital signs and the nursing notes.                              Differential diagnosis includes, but is not limited to, fracture, contusion, myalgias  Patient's presentation is most consistent with acute complicated illness / injury requiring diagnostic workup.  Patient's diagnosis is consistent with mechanical fall resulting in coccyx fracture patient presents to the ED for evaluation of slip and fall on ice.  Presents via EMS for evaluation of buttocks and low back pain.  Exam is overall reassuring.  Patient with admitted EtOH on board.  X-rays interpreted by me, show evidence of acute coccygeal fracture.  X-ray evidence of stable chronic, AVN of the bilateral hips redemonstrated.  Patient will be discharged home with instructions to take OTC Tylenol  or Motrin  as needed. Patient is to follow up with his primary provider or Ortho as suggested, as needed or otherwise directed. Patient is given ED precautions to return to the ED for any worsening or new symptoms.   FINAL CLINICAL IMPRESSION(S) / ED DIAGNOSES   Final diagnoses:  Fall in home, initial encounter  Fracture of coccyx, initial encounter for closed fracture [facility])     Rx / DC Orders   ED Discharge Orders     None        Note:  This document was prepared using Dragon voice recognition software and may include unintentional dictation errors.    Loyd Candida LULLA Aldona, PA-C 08/06/24 0004  "

## 2024-07-31 NOTE — ED Triage Notes (Signed)
 Pt to ED via EMS from home, pt reports he fell in the ice injuring his coccyx left hip and lower back. Pt denies hitting head. +ETOH
